# Patient Record
Sex: Female | Born: 1945 | Race: White | Hispanic: Yes | Smoking: Former smoker
Health system: Southern US, Community
[De-identification: ages and names within clinical notes are randomized; demographics above are authoritative.]

## PROBLEM LIST (undated history)

## (undated) ENCOUNTER — Emergency Department (HOSPITAL_COMMUNITY): Admission: EM | Payer: Medicare (Managed Care) | Source: Home / Self Care

## (undated) DIAGNOSIS — Z9889 Other specified postprocedural states: Secondary | ICD-10-CM

## (undated) DIAGNOSIS — I10 Essential (primary) hypertension: Secondary | ICD-10-CM

## (undated) DIAGNOSIS — I5022 Chronic systolic (congestive) heart failure: Secondary | ICD-10-CM

## (undated) DIAGNOSIS — G43909 Migraine, unspecified, not intractable, without status migrainosus: Secondary | ICD-10-CM

## (undated) DIAGNOSIS — IMO0001 Reserved for inherently not codable concepts without codable children: Secondary | ICD-10-CM

## (undated) DIAGNOSIS — G629 Polyneuropathy, unspecified: Secondary | ICD-10-CM

## (undated) DIAGNOSIS — R112 Nausea with vomiting, unspecified: Secondary | ICD-10-CM

## (undated) DIAGNOSIS — Z992 Dependence on renal dialysis: Secondary | ICD-10-CM

## (undated) DIAGNOSIS — E785 Hyperlipidemia, unspecified: Secondary | ICD-10-CM

## (undated) DIAGNOSIS — N186 End stage renal disease: Secondary | ICD-10-CM

## (undated) DIAGNOSIS — I739 Peripheral vascular disease, unspecified: Secondary | ICD-10-CM

## (undated) DIAGNOSIS — F329 Major depressive disorder, single episode, unspecified: Secondary | ICD-10-CM

## (undated) DIAGNOSIS — Z531 Procedure and treatment not carried out because of patient's decision for reasons of belief and group pressure: Secondary | ICD-10-CM

## (undated) DIAGNOSIS — E039 Hypothyroidism, unspecified: Secondary | ICD-10-CM

## (undated) DIAGNOSIS — L940 Localized scleroderma [morphea]: Secondary | ICD-10-CM

## (undated) DIAGNOSIS — I272 Pulmonary hypertension, unspecified: Secondary | ICD-10-CM

## (undated) DIAGNOSIS — N2581 Secondary hyperparathyroidism of renal origin: Secondary | ICD-10-CM

## (undated) DIAGNOSIS — E118 Type 2 diabetes mellitus with unspecified complications: Secondary | ICD-10-CM

## (undated) DIAGNOSIS — M797 Fibromyalgia: Secondary | ICD-10-CM

## (undated) DIAGNOSIS — D649 Anemia, unspecified: Secondary | ICD-10-CM

## (undated) DIAGNOSIS — G5603 Carpal tunnel syndrome, bilateral upper limbs: Secondary | ICD-10-CM

## (undated) DIAGNOSIS — I2510 Atherosclerotic heart disease of native coronary artery without angina pectoris: Secondary | ICD-10-CM

## (undated) DIAGNOSIS — F419 Anxiety disorder, unspecified: Secondary | ICD-10-CM

## (undated) HISTORY — PX: CARDIAC CATHETERIZATION: SHX172

## (undated) HISTORY — PX: PERIPHERAL ARTERIAL STENT GRAFT: SHX2220

## (undated) HISTORY — PX: AV FISTULA PLACEMENT: SHX1204

---

## 1985-05-26 HISTORY — PX: CHOLECYSTECTOMY: SHX55

## 1992-05-26 HISTORY — PX: VAGINAL HYSTERECTOMY: SUR661

## 1998-05-26 HISTORY — PX: CATARACT EXTRACTION, BILATERAL: SHX1313

## 2000-04-14 ENCOUNTER — Emergency Department (HOSPITAL_COMMUNITY): Admission: EM | Admit: 2000-04-14 | Discharge: 2000-04-14 | Payer: Self-pay | Admitting: Emergency Medicine

## 2000-06-02 ENCOUNTER — Other Ambulatory Visit: Admission: RE | Admit: 2000-06-02 | Discharge: 2000-06-02 | Payer: Self-pay | Admitting: Gastroenterology

## 2000-06-02 ENCOUNTER — Encounter (INDEPENDENT_AMBULATORY_CARE_PROVIDER_SITE_OTHER): Payer: Self-pay

## 2000-06-30 ENCOUNTER — Encounter: Admission: RE | Admit: 2000-06-30 | Discharge: 2000-09-28 | Payer: Self-pay | Admitting: Endocrinology

## 2000-07-22 ENCOUNTER — Encounter: Payer: Self-pay | Admitting: Gastroenterology

## 2000-07-22 ENCOUNTER — Ambulatory Visit (HOSPITAL_COMMUNITY): Admission: RE | Admit: 2000-07-22 | Discharge: 2000-07-22 | Payer: Self-pay | Admitting: Gastroenterology

## 2000-07-31 ENCOUNTER — Ambulatory Visit (HOSPITAL_COMMUNITY): Admission: RE | Admit: 2000-07-31 | Discharge: 2000-07-31 | Payer: Self-pay | Admitting: Gastroenterology

## 2000-07-31 ENCOUNTER — Encounter: Payer: Self-pay | Admitting: Gastroenterology

## 2001-02-10 ENCOUNTER — Emergency Department (HOSPITAL_COMMUNITY): Admission: EM | Admit: 2001-02-10 | Discharge: 2001-02-10 | Payer: Self-pay | Admitting: Emergency Medicine

## 2001-02-12 ENCOUNTER — Emergency Department (HOSPITAL_COMMUNITY): Admission: EM | Admit: 2001-02-12 | Discharge: 2001-02-12 | Payer: Self-pay | Admitting: *Deleted

## 2001-02-15 ENCOUNTER — Other Ambulatory Visit: Admission: RE | Admit: 2001-02-15 | Discharge: 2001-02-15 | Payer: Self-pay | Admitting: Obstetrics and Gynecology

## 2001-02-19 ENCOUNTER — Encounter: Payer: Self-pay | Admitting: Obstetrics and Gynecology

## 2001-02-19 ENCOUNTER — Encounter: Admission: RE | Admit: 2001-02-19 | Discharge: 2001-02-19 | Payer: Self-pay | Admitting: Obstetrics and Gynecology

## 2004-05-26 HISTORY — PX: BYPASS GRAFT: SHX909

## 2004-10-11 ENCOUNTER — Inpatient Hospital Stay (HOSPITAL_COMMUNITY): Admission: EM | Admit: 2004-10-11 | Discharge: 2004-10-17 | Payer: Self-pay | Admitting: Emergency Medicine

## 2004-10-14 ENCOUNTER — Encounter (INDEPENDENT_AMBULATORY_CARE_PROVIDER_SITE_OTHER): Payer: Self-pay | Admitting: Cardiology

## 2004-10-16 ENCOUNTER — Encounter: Payer: Self-pay | Admitting: Cardiology

## 2004-11-03 ENCOUNTER — Encounter: Admission: RE | Admit: 2004-11-03 | Discharge: 2004-11-03 | Payer: Self-pay | Admitting: Internal Medicine

## 2004-12-20 ENCOUNTER — Encounter: Admission: RE | Admit: 2004-12-20 | Discharge: 2004-12-20 | Payer: Self-pay | Admitting: Internal Medicine

## 2005-01-16 ENCOUNTER — Encounter: Admission: RE | Admit: 2005-01-16 | Discharge: 2005-01-16 | Payer: Self-pay | Admitting: Orthopedic Surgery

## 2005-01-29 ENCOUNTER — Encounter: Admission: RE | Admit: 2005-01-29 | Discharge: 2005-01-29 | Payer: Self-pay | Admitting: Orthopedic Surgery

## 2005-04-22 ENCOUNTER — Inpatient Hospital Stay (HOSPITAL_COMMUNITY): Admission: AD | Admit: 2005-04-22 | Discharge: 2005-05-01 | Payer: Self-pay | Admitting: Internal Medicine

## 2005-04-23 ENCOUNTER — Encounter (INDEPENDENT_AMBULATORY_CARE_PROVIDER_SITE_OTHER): Payer: Self-pay | Admitting: Cardiology

## 2005-05-26 HISTORY — PX: JOINT REPLACEMENT: SHX530

## 2005-06-05 ENCOUNTER — Ambulatory Visit (HOSPITAL_COMMUNITY): Admission: RE | Admit: 2005-06-05 | Discharge: 2005-06-06 | Payer: Self-pay | Admitting: Cardiology

## 2005-08-07 ENCOUNTER — Inpatient Hospital Stay (HOSPITAL_COMMUNITY): Admission: EM | Admit: 2005-08-07 | Discharge: 2005-08-09 | Payer: Self-pay | Admitting: Emergency Medicine

## 2005-08-27 ENCOUNTER — Encounter: Admission: RE | Admit: 2005-08-27 | Discharge: 2005-08-27 | Payer: Self-pay | Admitting: Internal Medicine

## 2005-12-28 IMAGING — CT CT L SPINE W/ CM
3 of 8 series · 15 of 33 positions shown, 18 images · IV contrast (omnipaque)
Comparison: none

CLINICAL DATA: The patient has low back pain and is referred for a lumbar myelogram. 
 LUMBAR MYELOGRAM:
 Lumbar puncture is performed at the L-4 level.  CSF is clear.  15 cc Omnipaque 180 contrast injected.  The needle was withdrawn and multiple images were obtained. 
 Patient with five non-rib bearing lumbar vertebrae.  Alignment anatomic.  Disc height loss at L5-S1.  On the AP and oblique projections, the nerve roots fill normally.  Shallow ventral defect is noted at L5-S1.

[Series 4: recon 3: l-spine helical · axial · 0.27mm/px · z∈[-15,+129]mm · 7 of 155 slices shown, 9 images]
[im 20/155  soft-tissue]
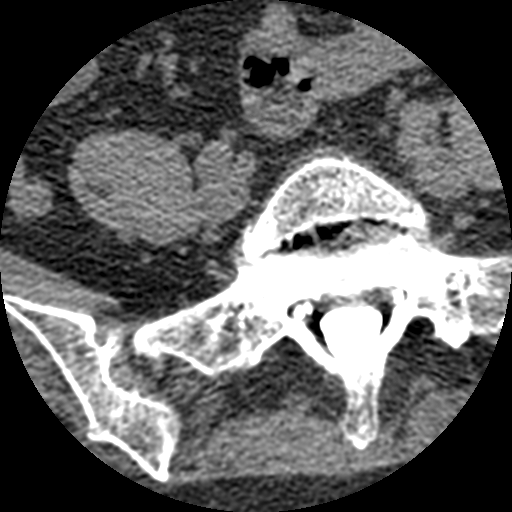
[im 20/155  bone]
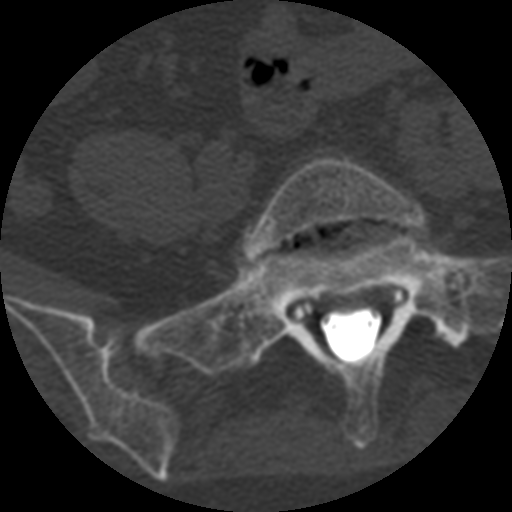
[im 39/155  bone]
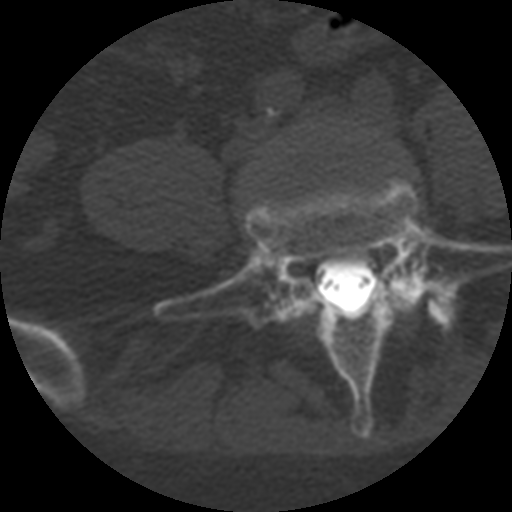
[im 58/155  bone]
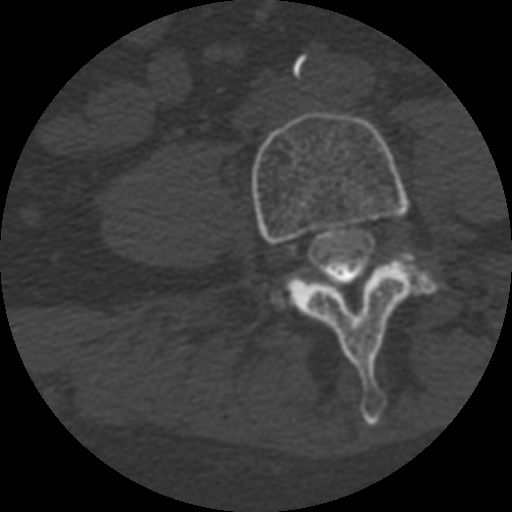
[im 78/155  bone]
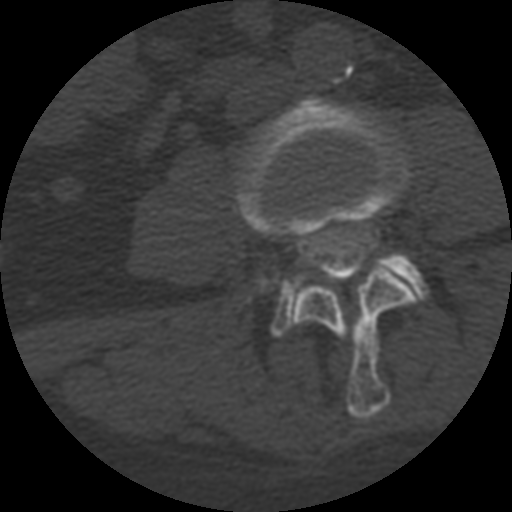
[im 97/155  soft-tissue]
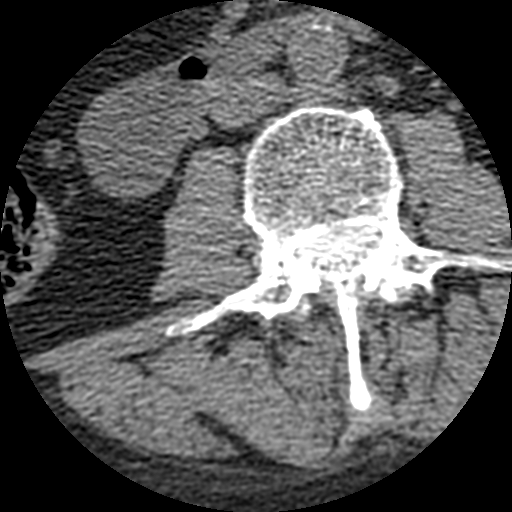
[im 97/155  bone]
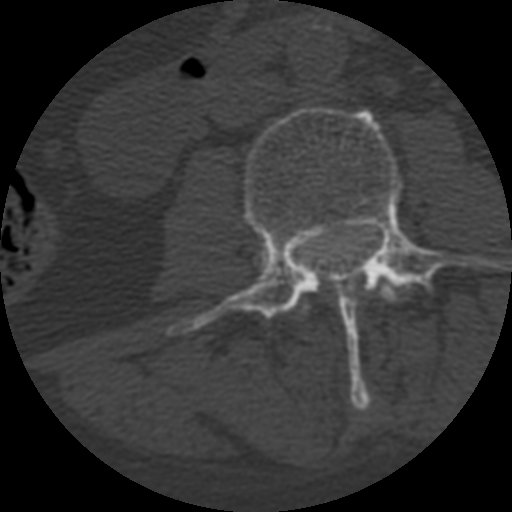
[im 116/155  bone]
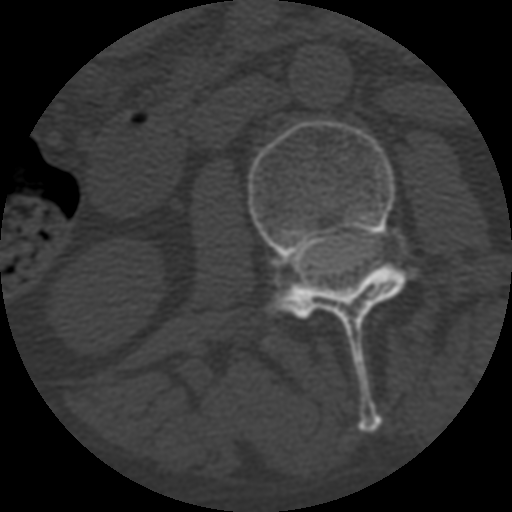
[im 135/155  bone]
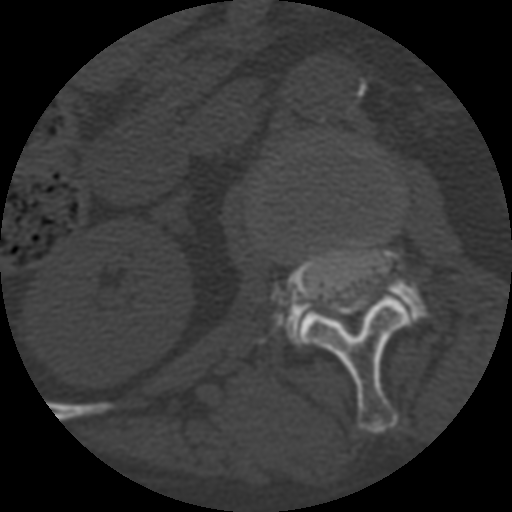

[Series 400: reformatted · sagittal · 0.37mm/px · 5 of 34 slices shown, 6 images (1 of 2)]
[im 12/34  bone]
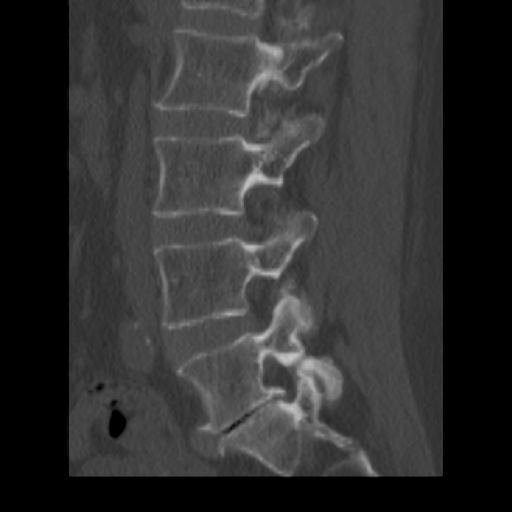
[im 14/34  bone]
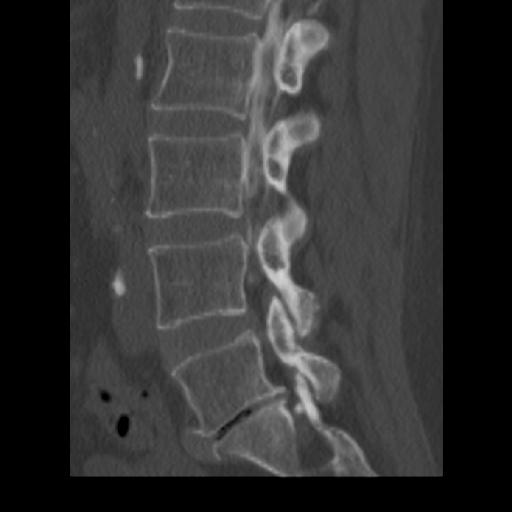
[im 17/34  soft-tissue]
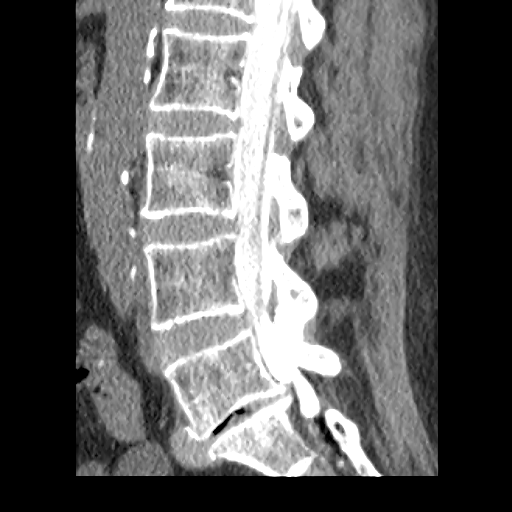
[im 17/34  bone]
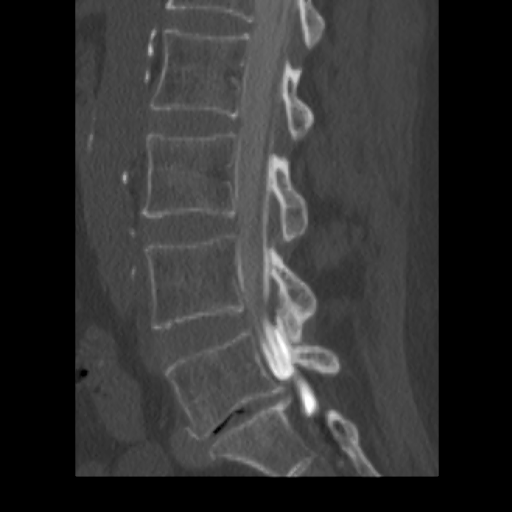
[im 20/34  bone]
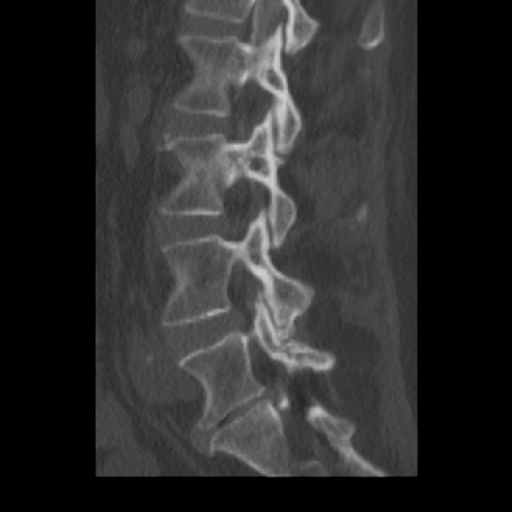
[im 23/34  bone]
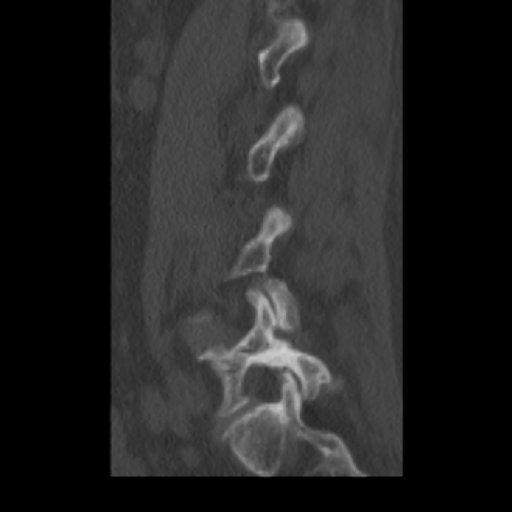

[Series 401: reformatted · coronal · 0.37mm/px · 3 of 40 slices shown (2 of 2)]
[im 8/40  bone]
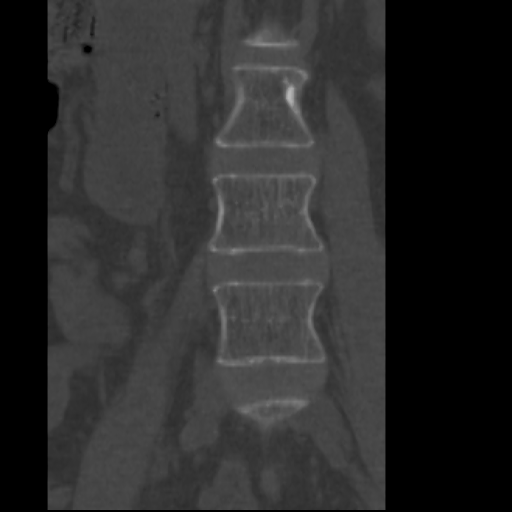
[im 16/40  bone]
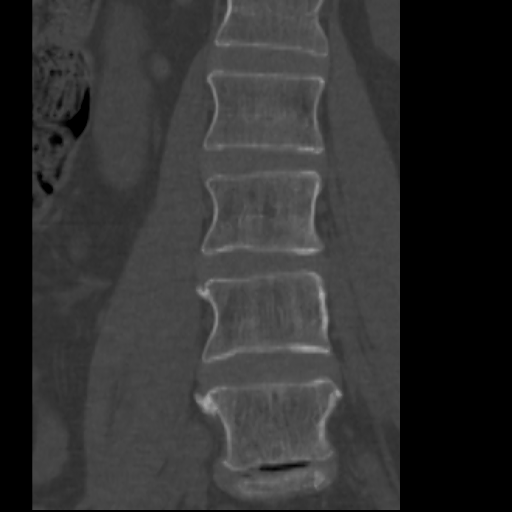
[im 24/40  bone]
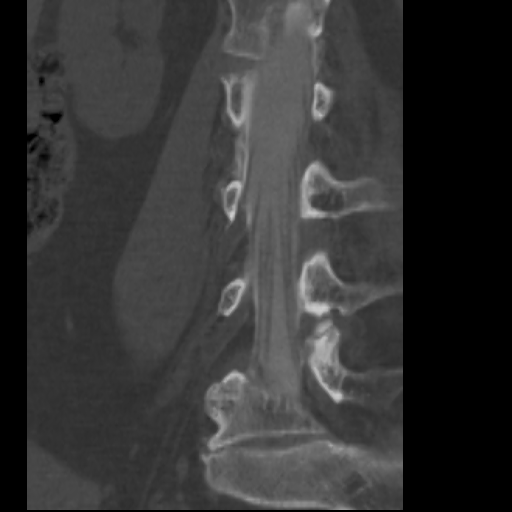

[15 of 33 positions shown; findings below may reference images not displayed]

IMPRESSION: Degenerative disc disease L5-S1.  No extradural defects appreciated.  CT to follow.
 CT LUMBAR SPINE POST-INTRATHECAL CONTRAST:
 Helical imaging supplemented with coronal and sagittal reformatted images. 
 L1-2:  Mild facet degenerative change.  Negative for central nor foraminal stenosis. 
 L2-3:  Mild facet degenerative change.  Negative for disc herniation, central nor foraminal stenosis. 
 L3-4:  Mild diffuse disc bulge without focal protrusion.  Mild facet degenerative changes.  No appreciable central nor foraminal stenosis.
 L4-5:  Disc bulge with mild facet osteoarthritis and ligamentum flavum hypertrophy.  This results in mild multifactorial central stenosis.  There is no appreciable subarticular lateral recess stenosis nor encroachment on the L-4 nerve roots.
 L5-S1:  Disc height loss with posterior osseous ridging eccentric right.   No encroachment upon the exiting L-5 roots.  Diffuse disc bulge eccentric right.  There is no appreciable lateral recess nor central stenosis.  Facet degenerative changes bilaterally.
IMPRESSION: 1.  Degenerative disc disease at the L5-S1 level without appreciable foraminal encroachment.  Disc bulge extends posteriorly without mass effect upon the S-1 roots or central stenosis.  Associated facet degenerative change. 
 2.  L4-5:  Mild multifactorial central stenosis.

## 2007-06-11 ENCOUNTER — Encounter (HOSPITAL_COMMUNITY): Admission: RE | Admit: 2007-06-11 | Discharge: 2007-09-09 | Payer: Self-pay | Admitting: Nephrology

## 2007-10-06 ENCOUNTER — Encounter (HOSPITAL_COMMUNITY): Admission: RE | Admit: 2007-10-06 | Discharge: 2008-01-04 | Payer: Self-pay | Admitting: Nephrology

## 2008-01-20 ENCOUNTER — Encounter (HOSPITAL_COMMUNITY): Admission: RE | Admit: 2008-01-20 | Discharge: 2008-04-19 | Payer: Self-pay | Admitting: Nephrology

## 2008-02-03 ENCOUNTER — Ambulatory Visit: Payer: Self-pay | Admitting: Surgery

## 2008-02-09 ENCOUNTER — Ambulatory Visit: Payer: Self-pay | Admitting: Vascular Surgery

## 2008-02-17 ENCOUNTER — Ambulatory Visit: Payer: Self-pay | Admitting: Vascular Surgery

## 2008-02-17 ENCOUNTER — Ambulatory Visit (HOSPITAL_COMMUNITY): Admission: RE | Admit: 2008-02-17 | Discharge: 2008-02-17 | Payer: Self-pay | Admitting: Vascular Surgery

## 2008-02-23 ENCOUNTER — Ambulatory Visit: Payer: Self-pay | Admitting: Vascular Surgery

## 2008-03-03 ENCOUNTER — Ambulatory Visit (HOSPITAL_COMMUNITY): Admission: RE | Admit: 2008-03-03 | Discharge: 2008-03-03 | Payer: Self-pay | Admitting: Vascular Surgery

## 2008-03-08 ENCOUNTER — Ambulatory Visit: Payer: Self-pay | Admitting: Vascular Surgery

## 2008-03-15 ENCOUNTER — Ambulatory Visit: Payer: Self-pay | Admitting: Vascular Surgery

## 2008-04-28 ENCOUNTER — Encounter (HOSPITAL_COMMUNITY): Admission: RE | Admit: 2008-04-28 | Discharge: 2008-05-25 | Payer: Self-pay | Admitting: Nephrology

## 2008-05-31 ENCOUNTER — Ambulatory Visit: Payer: Self-pay | Admitting: Vascular Surgery

## 2008-06-13 ENCOUNTER — Encounter (HOSPITAL_COMMUNITY): Admission: RE | Admit: 2008-06-13 | Discharge: 2008-09-11 | Payer: Self-pay | Admitting: Nephrology

## 2008-07-25 ENCOUNTER — Ambulatory Visit (HOSPITAL_COMMUNITY): Admission: RE | Admit: 2008-07-25 | Discharge: 2008-07-25 | Payer: Self-pay | Admitting: Vascular Surgery

## 2008-07-25 ENCOUNTER — Ambulatory Visit: Payer: Self-pay | Admitting: *Deleted

## 2008-08-25 ENCOUNTER — Emergency Department (HOSPITAL_COMMUNITY): Admission: EM | Admit: 2008-08-25 | Discharge: 2008-08-25 | Payer: Self-pay | Admitting: Emergency Medicine

## 2008-10-20 ENCOUNTER — Ambulatory Visit: Payer: Self-pay | Admitting: Vascular Surgery

## 2008-10-21 ENCOUNTER — Ambulatory Visit: Payer: Self-pay | Admitting: Cardiology

## 2008-10-21 ENCOUNTER — Ambulatory Visit: Payer: Self-pay | Admitting: *Deleted

## 2008-10-21 ENCOUNTER — Inpatient Hospital Stay (HOSPITAL_COMMUNITY): Admission: EM | Admit: 2008-10-21 | Discharge: 2008-11-16 | Payer: Self-pay | Admitting: Emergency Medicine

## 2008-10-24 ENCOUNTER — Encounter: Payer: Self-pay | Admitting: Vascular Surgery

## 2008-10-24 HISTORY — PX: LEG AMPUTATION BELOW KNEE: SHX694

## 2008-10-25 ENCOUNTER — Encounter: Payer: Self-pay | Admitting: Family Medicine

## 2008-10-25 ENCOUNTER — Encounter: Payer: Self-pay | Admitting: Vascular Surgery

## 2008-10-26 ENCOUNTER — Encounter: Payer: Self-pay | Admitting: Vascular Surgery

## 2008-10-27 ENCOUNTER — Encounter: Payer: Self-pay | Admitting: Vascular Surgery

## 2008-11-06 ENCOUNTER — Encounter: Payer: Self-pay | Admitting: Vascular Surgery

## 2008-11-07 ENCOUNTER — Ambulatory Visit: Payer: Self-pay | Admitting: Physical Medicine & Rehabilitation

## 2008-11-16 ENCOUNTER — Ambulatory Visit: Payer: Self-pay | Admitting: Physical Medicine & Rehabilitation

## 2008-11-16 ENCOUNTER — Inpatient Hospital Stay (HOSPITAL_COMMUNITY)
Admission: RE | Admit: 2008-11-16 | Discharge: 2008-11-24 | Payer: Self-pay | Admitting: Physical Medicine & Rehabilitation

## 2008-12-13 ENCOUNTER — Ambulatory Visit: Payer: Self-pay | Admitting: Vascular Surgery

## 2008-12-22 ENCOUNTER — Ambulatory Visit (HOSPITAL_COMMUNITY): Admission: RE | Admit: 2008-12-22 | Discharge: 2008-12-22 | Payer: Self-pay | Admitting: Vascular Surgery

## 2008-12-22 ENCOUNTER — Ambulatory Visit: Payer: Self-pay | Admitting: Vascular Surgery

## 2008-12-29 ENCOUNTER — Ambulatory Visit (HOSPITAL_COMMUNITY): Admission: RE | Admit: 2008-12-29 | Discharge: 2008-12-29 | Payer: Self-pay | Admitting: Vascular Surgery

## 2009-01-17 ENCOUNTER — Ambulatory Visit: Payer: Self-pay | Admitting: Vascular Surgery

## 2009-01-31 ENCOUNTER — Ambulatory Visit: Payer: Self-pay | Admitting: Vascular Surgery

## 2009-03-07 ENCOUNTER — Ambulatory Visit: Payer: Self-pay | Admitting: Vascular Surgery

## 2009-04-11 ENCOUNTER — Ambulatory Visit: Payer: Self-pay | Admitting: Vascular Surgery

## 2009-06-20 ENCOUNTER — Ambulatory Visit: Payer: Self-pay | Admitting: Vascular Surgery

## 2009-07-31 ENCOUNTER — Encounter: Admission: RE | Admit: 2009-07-31 | Discharge: 2009-09-03 | Payer: Self-pay | Admitting: Vascular Surgery

## 2009-08-22 ENCOUNTER — Ambulatory Visit: Payer: Self-pay | Admitting: Vascular Surgery

## 2009-08-31 ENCOUNTER — Ambulatory Visit (HOSPITAL_COMMUNITY): Admission: RE | Admit: 2009-08-31 | Discharge: 2009-08-31 | Payer: Self-pay | Admitting: Vascular Surgery

## 2009-08-31 ENCOUNTER — Ambulatory Visit: Payer: Self-pay | Admitting: Vascular Surgery

## 2009-09-04 ENCOUNTER — Inpatient Hospital Stay (HOSPITAL_COMMUNITY): Admission: RE | Admit: 2009-09-04 | Discharge: 2009-09-12 | Payer: Self-pay | Admitting: Vascular Surgery

## 2009-09-04 ENCOUNTER — Encounter: Payer: Self-pay | Admitting: Vascular Surgery

## 2009-09-05 ENCOUNTER — Encounter: Payer: Self-pay | Admitting: Vascular Surgery

## 2009-09-26 ENCOUNTER — Ambulatory Visit: Payer: Self-pay | Admitting: Vascular Surgery

## 2009-10-03 ENCOUNTER — Ambulatory Visit: Payer: Self-pay | Admitting: Vascular Surgery

## 2009-10-19 ENCOUNTER — Emergency Department (HOSPITAL_COMMUNITY): Admission: EM | Admit: 2009-10-19 | Discharge: 2009-10-19 | Payer: Self-pay | Admitting: Emergency Medicine

## 2009-10-24 HISTORY — PX: HEMIARTHROPLASTY HIP: SUR652

## 2009-10-25 ENCOUNTER — Ambulatory Visit: Payer: Self-pay | Admitting: Vascular Surgery

## 2009-10-31 ENCOUNTER — Ambulatory Visit: Payer: Self-pay | Admitting: Vascular Surgery

## 2009-11-01 ENCOUNTER — Ambulatory Visit: Payer: Self-pay | Admitting: Vascular Surgery

## 2009-11-01 ENCOUNTER — Ambulatory Visit (HOSPITAL_COMMUNITY): Admission: RE | Admit: 2009-11-01 | Discharge: 2009-11-01 | Payer: Self-pay | Admitting: Vascular Surgery

## 2009-11-07 ENCOUNTER — Ambulatory Visit: Payer: Self-pay | Admitting: Vascular Surgery

## 2009-11-12 ENCOUNTER — Inpatient Hospital Stay (HOSPITAL_COMMUNITY)
Admission: EM | Admit: 2009-11-12 | Discharge: 2009-11-16 | Disposition: A | Discharge status: Rehabilitation Facility | Payer: Self-pay | Source: Home / Self Care | Admitting: *Deleted

## 2009-11-15 ENCOUNTER — Ambulatory Visit: Payer: Self-pay | Admitting: Physical Medicine & Rehabilitation

## 2009-11-16 ENCOUNTER — Ambulatory Visit: Payer: Self-pay | Admitting: Physical Medicine & Rehabilitation

## 2009-11-16 ENCOUNTER — Inpatient Hospital Stay (HOSPITAL_COMMUNITY)
Admission: RE | Admit: 2009-11-16 | Discharge: 2009-11-24 | Payer: Self-pay | Admitting: Physical Medicine & Rehabilitation

## 2009-12-05 ENCOUNTER — Ambulatory Visit (HOSPITAL_COMMUNITY): Admission: RE | Admit: 2009-12-05 | Discharge: 2009-12-05 | Payer: Self-pay | Admitting: Nephrology

## 2009-12-05 ENCOUNTER — Ambulatory Visit (HOSPITAL_COMMUNITY): Admission: AD | Admit: 2009-12-05 | Discharge: 2009-12-05 | Payer: Self-pay | Admitting: Vascular Surgery

## 2009-12-05 ENCOUNTER — Ambulatory Visit: Payer: Self-pay | Admitting: Vascular Surgery

## 2009-12-06 ENCOUNTER — Ambulatory Visit (HOSPITAL_COMMUNITY): Admission: AD | Admit: 2009-12-06 | Discharge: 2009-12-06 | Payer: Self-pay | Admitting: Surgery

## 2010-01-17 ENCOUNTER — Encounter: Admission: RE | Admit: 2010-01-17 | Discharge: 2010-02-12 | Payer: Self-pay | Admitting: Specialist

## 2010-02-12 ENCOUNTER — Observation Stay (HOSPITAL_COMMUNITY): Admission: EM | Admit: 2010-02-12 | Discharge: 2010-02-14 | Payer: Self-pay | Admitting: Emergency Medicine

## 2010-03-15 ENCOUNTER — Ambulatory Visit (HOSPITAL_COMMUNITY): Admission: RE | Admit: 2010-03-15 | Discharge: 2010-03-15 | Payer: Self-pay | Admitting: Nephrology

## 2010-04-04 ENCOUNTER — Ambulatory Visit: Payer: Self-pay | Admitting: Vascular Surgery

## 2010-04-09 ENCOUNTER — Ambulatory Visit: Payer: Self-pay | Admitting: Vascular Surgery

## 2010-04-09 ENCOUNTER — Ambulatory Visit (HOSPITAL_COMMUNITY): Admission: RE | Admit: 2010-04-09 | Discharge: 2010-04-09 | Payer: Self-pay | Admitting: Vascular Surgery

## 2010-04-25 ENCOUNTER — Ambulatory Visit: Payer: Self-pay | Admitting: Vascular Surgery

## 2010-05-07 ENCOUNTER — Ambulatory Visit (HOSPITAL_COMMUNITY)
Admission: RE | Admit: 2010-05-07 | Discharge: 2010-05-07 | Payer: Self-pay | Source: Home / Self Care | Attending: Vascular Surgery | Admitting: Vascular Surgery

## 2010-05-23 ENCOUNTER — Ambulatory Visit: Payer: Self-pay | Admitting: Vascular Surgery

## 2010-06-16 ENCOUNTER — Encounter: Payer: Self-pay | Admitting: Internal Medicine

## 2010-06-20 ENCOUNTER — Ambulatory Visit
Admission: RE | Admit: 2010-06-20 | Discharge: 2010-06-20 | Payer: Self-pay | Source: Home / Self Care | Attending: Vascular Surgery | Admitting: Vascular Surgery

## 2010-06-20 ENCOUNTER — Encounter
Admission: RE | Admit: 2010-06-20 | Discharge: 2010-06-25 | Payer: Self-pay | Source: Home / Self Care | Attending: Specialist | Admitting: Specialist

## 2010-06-21 NOTE — Assessment & Plan Note (Signed)
OFFICE VISIT  [name], [name] DOB:  Mar 17, 1946                                       06/20/2010 [id number]#:[id number]  The patient returns for followup today.  She previously underwent a right basilic vein transposition approximately 1 month ago.  PHYSICAL EXAM:  Today blood pressure is 108/73 in the left arm, heart rate 75 and regular.  She has an easily palpable thrill and the fistula is palpable throughout the course of the right upper arm.  She occasionally has some numbness in her right middle finger but states this is usually transient and occurs early in the morning.  Otherwise she has no signs or symptoms suggestive of steal.  She apparently is also being evaluated by Dr. Otelia Sergeant for possible bilateral carpal tunnel syndrome which may explain the numbness in her middle finger.  In summary, I believe the fistula is maturing well.  It should be ready for cannulation in about 2 more months.  She will continue to use her catheter in the meantime.  She will be scheduled in the near future for reevaluation of her bypass graft as she is due for protocol scan.    Janetta Hora. Fields, MD Electronically Signed  CEF/MEDQ  D:  06/21/2010  T:  06/21/2010  Job:  [id number]  cc:   [facility]

## 2010-06-27 ENCOUNTER — Ambulatory Visit: Payer: Self-pay | Attending: Specialist | Admitting: Physical Therapy

## 2010-06-27 DIAGNOSIS — R5381 Other malaise: Secondary | ICD-10-CM | POA: Insufficient documentation

## 2010-06-27 DIAGNOSIS — M6281 Muscle weakness (generalized): Secondary | ICD-10-CM | POA: Insufficient documentation

## 2010-06-27 DIAGNOSIS — R269 Unspecified abnormalities of gait and mobility: Secondary | ICD-10-CM | POA: Insufficient documentation

## 2010-06-27 DIAGNOSIS — S88119A Complete traumatic amputation at level between knee and ankle, unspecified lower leg, initial encounter: Secondary | ICD-10-CM | POA: Insufficient documentation

## 2010-06-27 DIAGNOSIS — IMO0001 Reserved for inherently not codable concepts without codable children: Secondary | ICD-10-CM | POA: Insufficient documentation

## 2010-07-02 ENCOUNTER — Encounter: Payer: Self-pay | Admitting: Physical Therapy

## 2010-07-04 ENCOUNTER — Ambulatory Visit: Payer: Self-pay | Admitting: Physical Therapy

## 2010-07-04 ENCOUNTER — Encounter: Payer: Self-pay | Admitting: Physical Therapy

## 2010-07-09 ENCOUNTER — Ambulatory Visit: Payer: Self-pay | Admitting: Physical Therapy

## 2010-07-11 ENCOUNTER — Encounter: Payer: Self-pay | Admitting: Physical Therapy

## 2010-07-16 ENCOUNTER — Ambulatory Visit: Payer: Self-pay | Admitting: Physical Therapy

## 2010-07-18 ENCOUNTER — Encounter: Payer: Self-pay | Admitting: Physical Therapy

## 2010-07-23 ENCOUNTER — Encounter: Payer: Self-pay | Admitting: Physical Therapy

## 2010-07-25 ENCOUNTER — Encounter: Payer: Self-pay | Admitting: Physical Therapy

## 2010-07-30 ENCOUNTER — Encounter: Payer: Medicare Other | Admitting: Physical Therapy

## 2010-08-01 ENCOUNTER — Encounter: Payer: Medicare Other | Admitting: Physical Therapy

## 2010-08-05 LAB — POCT I-STAT 4, (NA,K, GLUC, HGB,HCT)
Glucose, Bld: 99 mg/dL (ref 70–99)
HCT: 37 % (ref 36.0–46.0)
Sodium: 138 mEq/L (ref 135–145)

## 2010-08-05 LAB — SURGICAL PCR SCREEN
MRSA, PCR: NEGATIVE
Staphylococcus aureus: NEGATIVE

## 2010-08-06 ENCOUNTER — Encounter: Payer: Medicare Other | Admitting: Physical Therapy

## 2010-08-06 LAB — POCT I-STAT 4, (NA,K, GLUC, HGB,HCT): HCT: 41 % (ref 36.0–46.0)

## 2010-08-06 LAB — GLUCOSE, CAPILLARY
Glucose-Capillary: 116 mg/dL — ABNORMAL HIGH (ref 70–99)
Glucose-Capillary: 84 mg/dL (ref 70–99)

## 2010-08-06 LAB — SURGICAL PCR SCREEN: MRSA, PCR: NEGATIVE

## 2010-08-08 ENCOUNTER — Encounter: Payer: Medicare Other | Admitting: Physical Therapy

## 2010-08-08 LAB — DIFFERENTIAL
Basophils Absolute: 0 10*3/uL (ref 0.0–0.1)
Basophils Relative: 0 % (ref 0–1)
Basophils Relative: 0 % (ref 0–1)
Eosinophils Absolute: 0.2 10*3/uL (ref 0.0–0.7)
Eosinophils Absolute: 0.3 10*3/uL (ref 0.0–0.7)
Lymphs Abs: 1 10*3/uL (ref 0.7–4.0)
Monocytes Absolute: 0.6 10*3/uL (ref 0.1–1.0)
Monocytes Relative: 8 % (ref 3–12)
Neutro Abs: 4.5 10*3/uL (ref 1.7–7.7)
Neutro Abs: 5.5 10*3/uL (ref 1.7–7.7)
Neutrophils Relative %: 71 % (ref 43–77)

## 2010-08-08 LAB — BASIC METABOLIC PANEL
BUN: 22 mg/dL (ref 6–23)
CO2: 28 mEq/L (ref 19–32)
Calcium: 9.1 mg/dL (ref 8.4–10.5)
GFR calc non Af Amer: 11 mL/min — ABNORMAL LOW (ref >60)
Glucose, Bld: 123 mg/dL — ABNORMAL HIGH (ref 70–99)
Potassium: 4.5 mEq/L (ref 3.5–5.1)
Sodium: 138 mEq/L (ref 135–145)

## 2010-08-08 LAB — HEMOGLOBIN A1C
Hgb A1c MFr Bld: 6.3 % — ABNORMAL HIGH (ref <5.7)
Mean Plasma Glucose: 134 mg/dL — ABNORMAL HIGH (ref <117)

## 2010-08-08 LAB — CBC
HCT: 36.6 % (ref 36.0–46.0)
HCT: 37 % (ref 36.0–46.0)
Hemoglobin: 11.6 g/dL — ABNORMAL LOW (ref 12.0–15.0)
Hemoglobin: 11.7 g/dL — ABNORMAL LOW (ref 12.0–15.0)
MCH: 30.1 pg (ref 26.0–34.0)
MCH: 30.2 pg (ref 26.0–34.0)
MCH: 30.4 pg (ref 26.0–34.0)
MCHC: 31.4 g/dL (ref 30.0–36.0)
MCHC: 31.8 g/dL (ref 30.0–36.0)
MCHC: 32 g/dL (ref 30.0–36.0)
MCV: 96.1 fL (ref 78.0–100.0)
Platelets: 161 10*3/uL (ref 150–400)
RDW: 14.8 % (ref 11.5–15.5)
RDW: 15.1 % (ref 11.5–15.5)

## 2010-08-08 LAB — GLUCOSE, CAPILLARY
Glucose-Capillary: 102 mg/dL — ABNORMAL HIGH (ref 70–99)
Glucose-Capillary: 141 mg/dL — ABNORMAL HIGH (ref 70–99)
Glucose-Capillary: 144 mg/dL — ABNORMAL HIGH (ref 70–99)

## 2010-08-08 LAB — COMPREHENSIVE METABOLIC PANEL
ALT: 12 U/L (ref 0–35)
BUN: 28 mg/dL — ABNORMAL HIGH (ref 6–23)
CO2: 31 mEq/L (ref 19–32)
Calcium: 9 mg/dL (ref 8.4–10.5)
GFR calc non Af Amer: 9 mL/min — ABNORMAL LOW (ref >60)
Glucose, Bld: 102 mg/dL — ABNORMAL HIGH (ref 70–99)
Sodium: 137 mEq/L (ref 135–145)

## 2010-08-08 LAB — RENAL FUNCTION PANEL
Albumin: 2.9 g/dL — ABNORMAL LOW (ref 3.5–5.2)
Calcium: 8.5 mg/dL (ref 8.4–10.5)
Creatinine, Ser: 6.44 mg/dL — ABNORMAL HIGH (ref 0.4–1.2)
GFR calc Af Amer: 8 mL/min — ABNORMAL LOW (ref >60)
GFR calc non Af Amer: 7 mL/min — ABNORMAL LOW (ref >60)
Phosphorus: 4.8 mg/dL — ABNORMAL HIGH (ref 2.3–4.6)

## 2010-08-08 LAB — POCT I-STAT, CHEM 8
BUN: 23 mg/dL (ref 6–23)
Calcium, Ion: 0.9 mmol/L — ABNORMAL LOW (ref 1.12–1.32)
Chloride: 104 mEq/L (ref 96–112)
Creatinine, Ser: 4.5 mg/dL — ABNORMAL HIGH (ref 0.4–1.2)
Glucose, Bld: 125 mg/dL — ABNORMAL HIGH (ref 70–99)
TCO2: 29 mmol/L (ref 0–100)

## 2010-08-08 LAB — LIPID PANEL
Cholesterol: 144 mg/dL (ref 0–200)
Triglycerides: 128 mg/dL (ref <150)

## 2010-08-11 LAB — GLUCOSE, CAPILLARY
Glucose-Capillary: 102 mg/dL — ABNORMAL HIGH (ref 70–99)
Glucose-Capillary: 102 mg/dL — ABNORMAL HIGH (ref 70–99)
Glucose-Capillary: 110 mg/dL — ABNORMAL HIGH (ref 70–99)
Glucose-Capillary: 178 mg/dL — ABNORMAL HIGH (ref 70–99)
Glucose-Capillary: 80 mg/dL (ref 70–99)
Glucose-Capillary: 102 mg/dL — ABNORMAL HIGH (ref 70–99)
Glucose-Capillary: 103 mg/dL — ABNORMAL HIGH (ref 70–99)
Glucose-Capillary: 103 mg/dL — ABNORMAL HIGH (ref 70–99)
Glucose-Capillary: 104 mg/dL — ABNORMAL HIGH (ref 70–99)
Glucose-Capillary: 107 mg/dL — ABNORMAL HIGH (ref 70–99)
Glucose-Capillary: 107 mg/dL — ABNORMAL HIGH (ref 70–99)
Glucose-Capillary: 110 mg/dL — ABNORMAL HIGH (ref 70–99)
Glucose-Capillary: 112 mg/dL — ABNORMAL HIGH (ref 70–99)
Glucose-Capillary: 117 mg/dL — ABNORMAL HIGH (ref 70–99)
Glucose-Capillary: 121 mg/dL — ABNORMAL HIGH (ref 70–99)
Glucose-Capillary: 122 mg/dL — ABNORMAL HIGH (ref 70–99)
Glucose-Capillary: 122 mg/dL — ABNORMAL HIGH (ref 70–99)
Glucose-Capillary: 128 mg/dL — ABNORMAL HIGH (ref 70–99)
Glucose-Capillary: 128 mg/dL — ABNORMAL HIGH (ref 70–99)
Glucose-Capillary: 131 mg/dL — ABNORMAL HIGH (ref 70–99)
Glucose-Capillary: 133 mg/dL — ABNORMAL HIGH (ref 70–99)
Glucose-Capillary: 138 mg/dL — ABNORMAL HIGH (ref 70–99)
Glucose-Capillary: 139 mg/dL — ABNORMAL HIGH (ref 70–99)
Glucose-Capillary: 173 mg/dL — ABNORMAL HIGH (ref 70–99)
Glucose-Capillary: 79 mg/dL (ref 70–99)
Glucose-Capillary: 85 mg/dL (ref 70–99)
Glucose-Capillary: 94 mg/dL (ref 70–99)
Glucose-Capillary: 95 mg/dL (ref 70–99)
Glucose-Capillary: 96 mg/dL (ref 70–99)

## 2010-08-11 LAB — CBC
HCT: 28.7 % — ABNORMAL LOW (ref 36.0–46.0)
HCT: 29.3 % — ABNORMAL LOW (ref 36.0–46.0)
HCT: 31.3 % — ABNORMAL LOW (ref 36.0–46.0)
HCT: 33.8 % — ABNORMAL LOW (ref 36.0–46.0)
HCT: 35.9 % — ABNORMAL LOW (ref 36.0–46.0)
HCT: 36.8 % (ref 36.0–46.0)
Hemoglobin: 9.8 g/dL — ABNORMAL LOW (ref 12.0–15.0)
Hemoglobin: 10 g/dL — ABNORMAL LOW (ref 12.0–15.0)
Hemoglobin: 10.5 g/dL — ABNORMAL LOW (ref 12.0–15.0)
Hemoglobin: 11.4 g/dL — ABNORMAL LOW (ref 12.0–15.0)
Hemoglobin: 11.9 g/dL — ABNORMAL LOW (ref 12.0–15.0)
Hemoglobin: 9.4 g/dL — ABNORMAL LOW (ref 12.0–15.0)
Hemoglobin: 9.6 g/dL — ABNORMAL LOW (ref 12.0–15.0)
MCH: 31.5 pg (ref 26.0–34.0)
MCH: 31.8 pg (ref 26.0–34.0)
MCH: 32.2 pg (ref 26.0–34.0)
MCHC: 32.7 g/dL (ref 30.0–36.0)
MCHC: 33.5 g/dL (ref 30.0–36.0)
MCHC: 33.7 g/dL (ref 30.0–36.0)
MCHC: 33.7 g/dL (ref 30.0–36.0)
MCHC: 33.8 g/dL (ref 30.0–36.0)
MCHC: 34 g/dL (ref 30.0–36.0)
MCV: 94.7 fL (ref 78.0–100.0)
MCV: 94.5 fL (ref 78.0–100.0)
MCV: 95.8 fL (ref 78.0–100.0)
Platelets: 111 10*3/uL — ABNORMAL LOW (ref 150–400)
Platelets: 171 10*3/uL (ref 150–400)
Platelets: 186 10*3/uL (ref 150–400)
Platelets: 191 10*3/uL (ref 150–400)
RBC: 3.1 MIL/uL — ABNORMAL LOW (ref 3.87–5.11)
RBC: 2.95 MIL/uL — ABNORMAL LOW (ref 3.87–5.11)
RBC: 3.53 MIL/uL — ABNORMAL LOW (ref 3.87–5.11)
RDW: 15.6 % — ABNORMAL HIGH (ref 11.5–15.5)
RDW: 16 % — ABNORMAL HIGH (ref 11.5–15.5)
RDW: 16.4 % — ABNORMAL HIGH (ref 11.5–15.5)
RDW: 16.5 % — ABNORMAL HIGH (ref 11.5–15.5)
RDW: 16.6 % — ABNORMAL HIGH (ref 11.5–15.5)
RDW: 16.9 % — ABNORMAL HIGH (ref 11.5–15.5)
WBC: 8 10*3/uL (ref 4.0–10.5)
WBC: 6.2 10*3/uL (ref 4.0–10.5)
WBC: 6.6 10*3/uL (ref 4.0–10.5)
WBC: 7.2 10*3/uL (ref 4.0–10.5)
WBC: 7.9 10*3/uL (ref 4.0–10.5)

## 2010-08-11 LAB — RENAL FUNCTION PANEL
Albumin: 2.7 g/dL — ABNORMAL LOW (ref 3.5–5.2)
Albumin: 3.3 g/dL — ABNORMAL LOW (ref 3.5–5.2)
BUN: 43 mg/dL — ABNORMAL HIGH (ref 6–23)
CO2: 24 mEq/L (ref 19–32)
CO2: 25 mEq/L (ref 19–32)
CO2: 28 mEq/L (ref 19–32)
CO2: 28 mEq/L (ref 19–32)
Calcium: 8.4 mg/dL (ref 8.4–10.5)
Calcium: 8.7 mg/dL (ref 8.4–10.5)
Calcium: 8.9 mg/dL (ref 8.4–10.5)
Chloride: 103 mEq/L (ref 96–112)
Chloride: 94 mEq/L — ABNORMAL LOW (ref 96–112)
Creatinine, Ser: 5.98 mg/dL — ABNORMAL HIGH (ref 0.4–1.2)
Creatinine, Ser: 6.63 mg/dL — ABNORMAL HIGH (ref 0.4–1.2)
GFR calc Af Amer: 16 mL/min — ABNORMAL LOW (ref >60)
GFR calc Af Amer: 7 mL/min — ABNORMAL LOW (ref >60)
GFR calc Af Amer: 8 mL/min — ABNORMAL LOW (ref >60)
GFR calc Af Amer: 9 mL/min — ABNORMAL LOW (ref >60)
GFR calc non Af Amer: 13 mL/min — ABNORMAL LOW (ref >60)
GFR calc non Af Amer: 6 mL/min — ABNORMAL LOW (ref >60)
GFR calc non Af Amer: 7 mL/min — ABNORMAL LOW (ref >60)
Glucose, Bld: 94 mg/dL (ref 70–99)
Glucose, Bld: 98 mg/dL (ref 70–99)
Phosphorus: 3 mg/dL (ref 2.3–4.6)
Phosphorus: 4.2 mg/dL (ref 2.3–4.6)
Phosphorus: 4.2 mg/dL (ref 2.3–4.6)
Potassium: 3.8 mEq/L (ref 3.5–5.1)
Potassium: 4 mEq/L (ref 3.5–5.1)
Potassium: 4.3 mEq/L (ref 3.5–5.1)
Sodium: 129 mEq/L — ABNORMAL LOW (ref 135–145)
Sodium: 134 mEq/L — ABNORMAL LOW (ref 135–145)
Sodium: 137 mEq/L (ref 135–145)
Sodium: 137 mEq/L (ref 135–145)

## 2010-08-11 LAB — URINALYSIS, ROUTINE W REFLEX MICROSCOPIC
Glucose, UA: 250 mg/dL — AB
Leukocytes, UA: NEGATIVE
Specific Gravity, Urine: 1.017 (ref 1.005–1.030)
pH: 8 (ref 5.0–8.0)

## 2010-08-11 LAB — BASIC METABOLIC PANEL
BUN: 15 mg/dL (ref 6–23)
BUN: 27 mg/dL — ABNORMAL HIGH (ref 6–23)
BUN: 40 mg/dL — ABNORMAL HIGH (ref 6–23)
CO2: 27 mEq/L (ref 19–32)
Calcium: 8.3 mg/dL — ABNORMAL LOW (ref 8.4–10.5)
Creatinine, Ser: 3.9 mg/dL — ABNORMAL HIGH (ref 0.4–1.2)
Creatinine, Ser: 5.33 mg/dL — ABNORMAL HIGH (ref 0.4–1.2)
Creatinine, Ser: 6.56 mg/dL — ABNORMAL HIGH (ref 0.4–1.2)
GFR calc Af Amer: 14 mL/min — ABNORMAL LOW (ref >60)
GFR calc non Af Amer: 6 mL/min — ABNORMAL LOW (ref >60)
GFR calc non Af Amer: 8 mL/min — ABNORMAL LOW (ref >60)
Glucose, Bld: 108 mg/dL — ABNORMAL HIGH (ref 70–99)
Glucose, Bld: 113 mg/dL — ABNORMAL HIGH (ref 70–99)
Glucose, Bld: 126 mg/dL — ABNORMAL HIGH (ref 70–99)
Potassium: 4.7 mEq/L (ref 3.5–5.1)
Potassium: 4.9 mEq/L (ref 3.5–5.1)

## 2010-08-11 LAB — URINE MICROSCOPIC-ADD ON

## 2010-08-11 LAB — POCT I-STAT 4, (NA,K, GLUC, HGB,HCT)
Glucose, Bld: 88 mg/dL (ref 70–99)
HCT: 38 % (ref 36.0–46.0)
HCT: 38 % (ref 36.0–46.0)
Hemoglobin: 12.9 g/dL (ref 12.0–15.0)
Potassium: 4.7 mEq/L (ref 3.5–5.1)
Sodium: 136 mEq/L (ref 135–145)

## 2010-08-11 LAB — IRON AND TIBC
Iron: 15 ug/dL — ABNORMAL LOW (ref 42–135)
TIBC: 179 ug/dL — ABNORMAL LOW (ref 250–470)
UIBC: 164 ug/dL

## 2010-08-11 LAB — DIFFERENTIAL
Basophils Absolute: 0 10*3/uL (ref 0.0–0.1)
Eosinophils Absolute: 0.6 10*3/uL (ref 0.0–0.7)
Eosinophils Relative: 8 % — ABNORMAL HIGH (ref 0–5)
Lymphocytes Relative: 12 % (ref 12–46)
Lymphs Abs: 1 10*3/uL (ref 0.7–4.0)
Neutrophils Relative %: 76 % (ref 43–77)

## 2010-08-11 LAB — PROTIME-INR
INR: 1.07 (ref 0.00–1.49)
Prothrombin Time: 13.8 seconds (ref 11.6–15.2)

## 2010-08-11 LAB — FERRITIN: Ferritin: 1177 ng/mL — ABNORMAL HIGH (ref 10–291)

## 2010-08-11 LAB — COMPREHENSIVE METABOLIC PANEL
BUN: 66 mg/dL — ABNORMAL HIGH (ref 6–23)
CO2: 23 mEq/L (ref 19–32)
Calcium: 9 mg/dL (ref 8.4–10.5)
Creatinine, Ser: 8 mg/dL — ABNORMAL HIGH (ref 0.4–1.2)
GFR calc non Af Amer: 5 mL/min — ABNORMAL LOW (ref >60)
Glucose, Bld: 101 mg/dL — ABNORMAL HIGH (ref 70–99)
Total Protein: 6.1 g/dL (ref 6.0–8.3)

## 2010-08-11 LAB — MRSA PCR SCREENING: MRSA by PCR: NEGATIVE

## 2010-08-11 LAB — TSH: TSH: 3.974 u[IU]/mL (ref 0.350–4.500)

## 2010-08-11 LAB — TYPE AND SCREEN
ABO/RH(D): O NEG
Antibody Screen: NEGATIVE

## 2010-08-11 LAB — NO BLOOD PRODUCTS

## 2010-08-11 LAB — HEMOGLOBIN A1C: Mean Plasma Glucose: 111 mg/dL (ref <117)

## 2010-08-12 LAB — DIFFERENTIAL
Basophils Absolute: 0 10*3/uL (ref 0.0–0.1)
Basophils Relative: 0 % (ref 0–1)
Eosinophils Relative: 2 % (ref 0–5)
Monocytes Absolute: 0.6 10*3/uL (ref 0.1–1.0)

## 2010-08-12 LAB — CBC
HCT: 33.7 % — ABNORMAL LOW (ref 36.0–46.0)
Hemoglobin: 11.5 g/dL — ABNORMAL LOW (ref 12.0–15.0)
MCHC: 34 g/dL (ref 30.0–36.0)
RDW: 15.7 % — ABNORMAL HIGH (ref 11.5–15.5)

## 2010-08-12 LAB — BASIC METABOLIC PANEL
CO2: 30 mEq/L (ref 19–32)
Calcium: 8.6 mg/dL (ref 8.4–10.5)
Glucose, Bld: 138 mg/dL — ABNORMAL HIGH (ref 70–99)
Potassium: 3.3 mEq/L — ABNORMAL LOW (ref 3.5–5.1)
Sodium: 138 mEq/L (ref 135–145)

## 2010-08-12 LAB — ANAEROBIC CULTURE

## 2010-08-12 LAB — GLUCOSE, CAPILLARY: Glucose-Capillary: 159 mg/dL — ABNORMAL HIGH (ref 70–99)

## 2010-08-12 LAB — WOUND CULTURE

## 2010-08-12 LAB — POCT I-STAT 4, (NA,K, GLUC, HGB,HCT)
Glucose, Bld: 137 mg/dL — ABNORMAL HIGH (ref 70–99)
Sodium: 140 mEq/L (ref 135–145)

## 2010-08-13 ENCOUNTER — Other Ambulatory Visit (HOSPITAL_COMMUNITY): Payer: Self-pay | Admitting: Nephrology

## 2010-08-13 ENCOUNTER — Ambulatory Visit: Payer: Medicare Other | Admitting: Physical Therapy

## 2010-08-13 DIAGNOSIS — N186 End stage renal disease: Secondary | ICD-10-CM

## 2010-08-13 LAB — GLUCOSE, CAPILLARY
Glucose-Capillary: 108 mg/dL — ABNORMAL HIGH (ref 70–99)
Glucose-Capillary: 112 mg/dL — ABNORMAL HIGH (ref 70–99)
Glucose-Capillary: 113 mg/dL — ABNORMAL HIGH (ref 70–99)
Glucose-Capillary: 114 mg/dL — ABNORMAL HIGH (ref 70–99)
Glucose-Capillary: 116 mg/dL — ABNORMAL HIGH (ref 70–99)
Glucose-Capillary: 117 mg/dL — ABNORMAL HIGH (ref 70–99)
Glucose-Capillary: 118 mg/dL — ABNORMAL HIGH (ref 70–99)
Glucose-Capillary: 118 mg/dL — ABNORMAL HIGH (ref 70–99)
Glucose-Capillary: 121 mg/dL — ABNORMAL HIGH (ref 70–99)
Glucose-Capillary: 136 mg/dL — ABNORMAL HIGH (ref 70–99)
Glucose-Capillary: 140 mg/dL — ABNORMAL HIGH (ref 70–99)
Glucose-Capillary: 151 mg/dL — ABNORMAL HIGH (ref 70–99)
Glucose-Capillary: 79 mg/dL (ref 70–99)
Glucose-Capillary: 85 mg/dL (ref 70–99)
Glucose-Capillary: 90 mg/dL (ref 70–99)
Glucose-Capillary: 91 mg/dL (ref 70–99)
Glucose-Capillary: 93 mg/dL (ref 70–99)
Glucose-Capillary: 94 mg/dL (ref 70–99)
Glucose-Capillary: 97 mg/dL (ref 70–99)

## 2010-08-13 LAB — COMPREHENSIVE METABOLIC PANEL
ALT: 14 U/L (ref 0–35)
AST: 22 U/L (ref 0–37)
Albumin: 2.5 g/dL — ABNORMAL LOW (ref 3.5–5.2)
Alkaline Phosphatase: 75 U/L (ref 39–117)
BUN: 55 mg/dL — ABNORMAL HIGH (ref 6–23)
Chloride: 98 mEq/L (ref 96–112)
GFR calc Af Amer: 6 mL/min — ABNORMAL LOW (ref >60)
Potassium: 4 mEq/L (ref 3.5–5.1)
Sodium: 134 mEq/L — ABNORMAL LOW (ref 135–145)
Total Bilirubin: 0.9 mg/dL (ref 0.3–1.2)
Total Protein: 5.4 g/dL — ABNORMAL LOW (ref 6.0–8.3)

## 2010-08-13 LAB — RENAL FUNCTION PANEL
CO2: 27 mEq/L (ref 19–32)
Calcium: 8.3 mg/dL — ABNORMAL LOW (ref 8.4–10.5)
Chloride: 99 mEq/L (ref 96–112)
Creatinine, Ser: 6.44 mg/dL — ABNORMAL HIGH (ref 0.4–1.2)
Creatinine, Ser: 6.78 mg/dL — ABNORMAL HIGH (ref 0.4–1.2)
GFR calc Af Amer: 7 mL/min — ABNORMAL LOW (ref >60)
GFR calc Af Amer: 8 mL/min — ABNORMAL LOW (ref >60)
GFR calc non Af Amer: 6 mL/min — ABNORMAL LOW (ref >60)
Glucose, Bld: 114 mg/dL — ABNORMAL HIGH (ref 70–99)
Glucose, Bld: 94 mg/dL (ref 70–99)
Phosphorus: 4.8 mg/dL — ABNORMAL HIGH (ref 2.3–4.6)
Sodium: 135 mEq/L (ref 135–145)
Sodium: 136 mEq/L (ref 135–145)

## 2010-08-13 LAB — CBC
HCT: 23 % — ABNORMAL LOW (ref 36.0–46.0)
Hemoglobin: 8.8 g/dL — ABNORMAL LOW (ref 12.0–15.0)
MCHC: 34.2 g/dL (ref 30.0–36.0)
MCV: 95.9 fL (ref 78.0–100.0)
MCV: 96.4 fL (ref 78.0–100.0)
Platelets: 205 10*3/uL (ref 150–400)
RBC: 2.65 MIL/uL — ABNORMAL LOW (ref 3.87–5.11)
RDW: 14.6 % (ref 11.5–15.5)
RDW: 14.8 % (ref 11.5–15.5)
WBC: 6.2 10*3/uL (ref 4.0–10.5)
WBC: 6.6 10*3/uL (ref 4.0–10.5)

## 2010-08-14 LAB — RENAL FUNCTION PANEL
CO2: 24 mEq/L (ref 19–32)
Calcium: 8.1 mg/dL — ABNORMAL LOW (ref 8.4–10.5)
Chloride: 102 mEq/L (ref 96–112)
Creatinine, Ser: 7.09 mg/dL — ABNORMAL HIGH (ref 0.4–1.2)
GFR calc Af Amer: 7 mL/min — ABNORMAL LOW (ref >60)
GFR calc non Af Amer: 6 mL/min — ABNORMAL LOW (ref >60)
Glucose, Bld: 92 mg/dL (ref 70–99)

## 2010-08-14 LAB — GLUCOSE, CAPILLARY
Glucose-Capillary: 101 mg/dL — ABNORMAL HIGH (ref 70–99)
Glucose-Capillary: 128 mg/dL — ABNORMAL HIGH (ref 70–99)
Glucose-Capillary: 140 mg/dL — ABNORMAL HIGH (ref 70–99)
Glucose-Capillary: 174 mg/dL — ABNORMAL HIGH (ref 70–99)
Glucose-Capillary: 87 mg/dL (ref 70–99)
Glucose-Capillary: 99 mg/dL (ref 70–99)
Glucose-Capillary: 99 mg/dL (ref 70–99)

## 2010-08-14 LAB — NO BLOOD PRODUCTS

## 2010-08-14 LAB — CBC
HCT: 28.6 % — ABNORMAL LOW (ref 36.0–46.0)
Hemoglobin: 9.8 g/dL — ABNORMAL LOW (ref 12.0–15.0)
MCHC: 33.7 g/dL (ref 30.0–36.0)
MCHC: 34.3 g/dL (ref 30.0–36.0)
MCV: 96 fL (ref 78.0–100.0)
MCV: 96.9 fL (ref 78.0–100.0)
RBC: 2.98 MIL/uL — ABNORMAL LOW (ref 3.87–5.11)
RDW: 15 % (ref 11.5–15.5)
RDW: 15.2 % (ref 11.5–15.5)

## 2010-08-14 LAB — POCT I-STAT 4, (NA,K, GLUC, HGB,HCT)
Glucose, Bld: 105 mg/dL — ABNORMAL HIGH (ref 70–99)
HCT: 38 % (ref 36.0–46.0)
Hemoglobin: 12.9 g/dL (ref 12.0–15.0)
Sodium: 140 mEq/L (ref 135–145)

## 2010-08-14 LAB — BLOOD GAS, ARTERIAL
Acid-Base Excess: 6.4 mmol/L — ABNORMAL HIGH (ref 0.0–2.0)
Drawn by: 181601
FIO2: 0.21 %
O2 Saturation: 96.4 %
Patient temperature: 98.6

## 2010-08-14 LAB — POCT I-STAT, CHEM 8
BUN: 24 mg/dL — ABNORMAL HIGH (ref 6–23)
Chloride: 100 mEq/L (ref 96–112)
HCT: 37 % (ref 36.0–46.0)
Hemoglobin: 12.6 g/dL (ref 12.0–15.0)
TCO2: 34 mmol/L (ref 0–100)

## 2010-08-14 LAB — COMPREHENSIVE METABOLIC PANEL
ALT: 16 U/L (ref 0–35)
AST: 17 U/L (ref 0–37)
Calcium: 8.8 mg/dL (ref 8.4–10.5)
Creatinine, Ser: 4.97 mg/dL — ABNORMAL HIGH (ref 0.4–1.2)
GFR calc Af Amer: 11 mL/min — ABNORMAL LOW (ref >60)
GFR calc non Af Amer: 9 mL/min — ABNORMAL LOW (ref >60)
Glucose, Bld: 84 mg/dL (ref 70–99)
Sodium: 138 mEq/L (ref 135–145)
Total Protein: 6.1 g/dL (ref 6.0–8.3)

## 2010-08-15 ENCOUNTER — Other Ambulatory Visit: Payer: Self-pay | Admitting: Family Medicine

## 2010-08-15 ENCOUNTER — Ambulatory Visit
Admission: RE | Admit: 2010-08-15 | Discharge: 2010-08-15 | Disposition: A | Discharge status: Home / Self Care | Payer: No Typology Code available for payment source | Source: Ambulatory Visit | Attending: Family Medicine | Admitting: Family Medicine

## 2010-08-15 ENCOUNTER — Encounter: Payer: Medicare Other | Admitting: Physical Therapy

## 2010-08-15 DIAGNOSIS — R52 Pain, unspecified: Secondary | ICD-10-CM

## 2010-08-20 ENCOUNTER — Encounter: Payer: Medicare Other | Admitting: Physical Therapy

## 2010-08-22 ENCOUNTER — Encounter: Payer: Medicare Other | Admitting: Physical Therapy

## 2010-08-29 ENCOUNTER — Other Ambulatory Visit (HOSPITAL_COMMUNITY): Payer: Self-pay | Admitting: Nephrology

## 2010-08-29 ENCOUNTER — Ambulatory Visit (HOSPITAL_COMMUNITY)
Admission: RE | Admit: 2010-08-29 | Discharge: 2010-08-29 | Disposition: A | Discharge status: Home / Self Care | Payer: Medicare (Managed Care) | Source: Ambulatory Visit | Attending: Nephrology | Admitting: Nephrology

## 2010-08-29 DIAGNOSIS — Y832 Surgical operation with anastomosis, bypass or graft as the cause of abnormal reaction of the patient, or of later complication, without mention of misadventure at the time of the procedure: Secondary | ICD-10-CM | POA: Insufficient documentation

## 2010-08-29 DIAGNOSIS — N186 End stage renal disease: Secondary | ICD-10-CM

## 2010-08-29 DIAGNOSIS — T82898A Other specified complication of vascular prosthetic devices, implants and grafts, initial encounter: Secondary | ICD-10-CM | POA: Insufficient documentation

## 2010-08-29 MED ORDER — IOHEXOL 300 MG/ML  SOLN
100.0000 mL | Freq: Once | INTRAMUSCULAR | Status: AC | PRN
  Administered 2010-08-29: 23.4 mL via INTRAVENOUS

## 2010-08-31 LAB — POCT I-STAT, CHEM 8
Calcium, Ion: 1.12 mmol/L (ref 1.12–1.32)
HCT: 44 % (ref 36.0–46.0)
Sodium: 139 mEq/L (ref 135–145)
TCO2: 30 mmol/L (ref 0–100)

## 2010-08-31 LAB — GLUCOSE, CAPILLARY: Glucose-Capillary: 176 mg/dL — ABNORMAL HIGH (ref 70–99)

## 2010-09-01 LAB — POCT I-STAT, CHEM 8
Calcium, Ion: 0.96 mmol/L — ABNORMAL LOW (ref 1.12–1.32)
HCT: 47 % — ABNORMAL HIGH (ref 36.0–46.0)
Hemoglobin: 16 g/dL — ABNORMAL HIGH (ref 12.0–15.0)
Sodium: 135 mEq/L (ref 135–145)
TCO2: 31 mmol/L (ref 0–100)

## 2010-09-01 LAB — CBC
HCT: 36.8 % (ref 36.0–46.0)
Hemoglobin: 11.7 g/dL — ABNORMAL LOW (ref 12.0–15.0)
Platelets: 264 10*3/uL (ref 150–400)
RBC: 3.95 MIL/uL (ref 3.87–5.11)
WBC: 6.2 10*3/uL (ref 4.0–10.5)

## 2010-09-01 LAB — RENAL FUNCTION PANEL
Albumin: 2.9 g/dL — ABNORMAL LOW (ref 3.5–5.2)
BUN: 20 mg/dL (ref 6–23)
CO2: 28 mEq/L (ref 19–32)
Chloride: 94 mEq/L — ABNORMAL LOW (ref 96–112)
Glucose, Bld: 118 mg/dL — ABNORMAL HIGH (ref 70–99)
Potassium: 3.7 mEq/L (ref 3.5–5.1)

## 2010-09-01 LAB — GLUCOSE, CAPILLARY
Glucose-Capillary: 127 mg/dL — ABNORMAL HIGH (ref 70–99)
Glucose-Capillary: 141 mg/dL — ABNORMAL HIGH (ref 70–99)

## 2010-09-02 LAB — BASIC METABOLIC PANEL
BUN: 32 mg/dL — ABNORMAL HIGH (ref 6–23)
BUN: 36 mg/dL — ABNORMAL HIGH (ref 6–23)
CO2: 28 mEq/L (ref 19–32)
Calcium: 8.1 mg/dL — ABNORMAL LOW (ref 8.4–10.5)
Chloride: 101 mEq/L (ref 96–112)
Chloride: 97 mEq/L (ref 96–112)
Chloride: 98 mEq/L (ref 96–112)
Creatinine, Ser: 7.8 mg/dL — ABNORMAL HIGH (ref 0.4–1.2)
GFR calc Af Amer: 9 mL/min — ABNORMAL LOW (ref >60)
GFR calc Af Amer: 9 mL/min — ABNORMAL LOW (ref >60)
GFR calc non Af Amer: 5 mL/min — ABNORMAL LOW (ref >60)
GFR calc non Af Amer: 7 mL/min — ABNORMAL LOW (ref >60)
Glucose, Bld: 76 mg/dL (ref 70–99)
Glucose, Bld: 77 mg/dL (ref 70–99)
Glucose, Bld: 86 mg/dL (ref 70–99)
Potassium: 4 mEq/L (ref 3.5–5.1)
Potassium: 4.7 mEq/L (ref 3.5–5.1)
Potassium: 4.7 mEq/L (ref 3.5–5.1)
Sodium: 133 mEq/L — ABNORMAL LOW (ref 135–145)
Sodium: 136 mEq/L (ref 135–145)
Sodium: 136 mEq/L (ref 135–145)

## 2010-09-02 LAB — GLUCOSE, CAPILLARY
Glucose-Capillary: 101 mg/dL — ABNORMAL HIGH (ref 70–99)
Glucose-Capillary: 102 mg/dL — ABNORMAL HIGH (ref 70–99)
Glucose-Capillary: 104 mg/dL — ABNORMAL HIGH (ref 70–99)
Glucose-Capillary: 106 mg/dL — ABNORMAL HIGH (ref 70–99)
Glucose-Capillary: 106 mg/dL — ABNORMAL HIGH (ref 70–99)
Glucose-Capillary: 106 mg/dL — ABNORMAL HIGH (ref 70–99)
Glucose-Capillary: 113 mg/dL — ABNORMAL HIGH (ref 70–99)
Glucose-Capillary: 115 mg/dL — ABNORMAL HIGH (ref 70–99)
Glucose-Capillary: 120 mg/dL — ABNORMAL HIGH (ref 70–99)
Glucose-Capillary: 122 mg/dL — ABNORMAL HIGH (ref 70–99)
Glucose-Capillary: 122 mg/dL — ABNORMAL HIGH (ref 70–99)
Glucose-Capillary: 125 mg/dL — ABNORMAL HIGH (ref 70–99)
Glucose-Capillary: 125 mg/dL — ABNORMAL HIGH (ref 70–99)
Glucose-Capillary: 125 mg/dL — ABNORMAL HIGH (ref 70–99)
Glucose-Capillary: 134 mg/dL — ABNORMAL HIGH (ref 70–99)
Glucose-Capillary: 134 mg/dL — ABNORMAL HIGH (ref 70–99)
Glucose-Capillary: 138 mg/dL — ABNORMAL HIGH (ref 70–99)
Glucose-Capillary: 147 mg/dL — ABNORMAL HIGH (ref 70–99)
Glucose-Capillary: 149 mg/dL — ABNORMAL HIGH (ref 70–99)
Glucose-Capillary: 158 mg/dL — ABNORMAL HIGH (ref 70–99)
Glucose-Capillary: 160 mg/dL — ABNORMAL HIGH (ref 70–99)
Glucose-Capillary: 166 mg/dL — ABNORMAL HIGH (ref 70–99)
Glucose-Capillary: 171 mg/dL — ABNORMAL HIGH (ref 70–99)
Glucose-Capillary: 181 mg/dL — ABNORMAL HIGH (ref 70–99)
Glucose-Capillary: 82 mg/dL (ref 70–99)
Glucose-Capillary: 89 mg/dL (ref 70–99)
Glucose-Capillary: 93 mg/dL (ref 70–99)
Glucose-Capillary: 94 mg/dL (ref 70–99)
Glucose-Capillary: 95 mg/dL (ref 70–99)
Glucose-Capillary: 98 mg/dL (ref 70–99)
Glucose-Capillary: 99 mg/dL (ref 70–99)
Glucose-Capillary: 100 mg/dL — ABNORMAL HIGH (ref 70–99)
Glucose-Capillary: 100 mg/dL — ABNORMAL HIGH (ref 70–99)
Glucose-Capillary: 100 mg/dL — ABNORMAL HIGH (ref 70–99)
Glucose-Capillary: 108 mg/dL — ABNORMAL HIGH (ref 70–99)
Glucose-Capillary: 111 mg/dL — ABNORMAL HIGH (ref 70–99)
Glucose-Capillary: 116 mg/dL — ABNORMAL HIGH (ref 70–99)
Glucose-Capillary: 117 mg/dL — ABNORMAL HIGH (ref 70–99)
Glucose-Capillary: 120 mg/dL — ABNORMAL HIGH (ref 70–99)
Glucose-Capillary: 129 mg/dL — ABNORMAL HIGH (ref 70–99)
Glucose-Capillary: 135 mg/dL — ABNORMAL HIGH (ref 70–99)
Glucose-Capillary: 139 mg/dL — ABNORMAL HIGH (ref 70–99)
Glucose-Capillary: 140 mg/dL — ABNORMAL HIGH (ref 70–99)
Glucose-Capillary: 143 mg/dL — ABNORMAL HIGH (ref 70–99)
Glucose-Capillary: 148 mg/dL — ABNORMAL HIGH (ref 70–99)
Glucose-Capillary: 58 mg/dL — ABNORMAL LOW (ref 70–99)
Glucose-Capillary: 61 mg/dL — ABNORMAL LOW (ref 70–99)
Glucose-Capillary: 66 mg/dL — ABNORMAL LOW (ref 70–99)
Glucose-Capillary: 70 mg/dL (ref 70–99)
Glucose-Capillary: 75 mg/dL (ref 70–99)
Glucose-Capillary: 76 mg/dL (ref 70–99)
Glucose-Capillary: 77 mg/dL (ref 70–99)
Glucose-Capillary: 80 mg/dL (ref 70–99)
Glucose-Capillary: 82 mg/dL (ref 70–99)
Glucose-Capillary: 84 mg/dL (ref 70–99)
Glucose-Capillary: 84 mg/dL (ref 70–99)
Glucose-Capillary: 86 mg/dL (ref 70–99)
Glucose-Capillary: 86 mg/dL (ref 70–99)
Glucose-Capillary: 87 mg/dL (ref 70–99)
Glucose-Capillary: 93 mg/dL (ref 70–99)
Glucose-Capillary: 95 mg/dL (ref 70–99)
Glucose-Capillary: 96 mg/dL (ref 70–99)
Glucose-Capillary: 98 mg/dL (ref 70–99)
Glucose-Capillary: 99 mg/dL (ref 70–99)

## 2010-09-02 LAB — COMPREHENSIVE METABOLIC PANEL
ALT: 16 U/L (ref 0–35)
ALT: 12 U/L (ref 0–35)
AST: 20 U/L (ref 0–37)
AST: 15 U/L (ref 0–37)
AST: 44 U/L — ABNORMAL HIGH (ref 0–37)
Albumin: 2.4 g/dL — ABNORMAL LOW (ref 3.5–5.2)
Alkaline Phosphatase: 58 U/L (ref 39–117)
BUN: 26 mg/dL — ABNORMAL HIGH (ref 6–23)
CO2: 28 mEq/L (ref 19–32)
CO2: 25 mEq/L (ref 19–32)
CO2: 26 mEq/L (ref 19–32)
Calcium: 8.5 mg/dL (ref 8.4–10.5)
Calcium: 8 mg/dL — ABNORMAL LOW (ref 8.4–10.5)
Calcium: 8.2 mg/dL — ABNORMAL LOW (ref 8.4–10.5)
Chloride: 98 mEq/L (ref 96–112)
Chloride: 95 mEq/L — ABNORMAL LOW (ref 96–112)
Creatinine, Ser: 7.33 mg/dL — ABNORMAL HIGH (ref 0.4–1.2)
Creatinine, Ser: 6.01 mg/dL — ABNORMAL HIGH (ref 0.4–1.2)
GFR calc Af Amer: 7 mL/min — ABNORMAL LOW (ref >60)
GFR calc Af Amer: 6 mL/min — ABNORMAL LOW (ref >60)
GFR calc non Af Amer: 5 mL/min — ABNORMAL LOW (ref >60)
GFR calc non Af Amer: 7 mL/min — ABNORMAL LOW (ref >60)
Glucose, Bld: 118 mg/dL — ABNORMAL HIGH (ref 70–99)
Glucose, Bld: 74 mg/dL (ref 70–99)
Potassium: 4.7 mEq/L (ref 3.5–5.1)
Sodium: 136 mEq/L (ref 135–145)
Sodium: 132 mEq/L — ABNORMAL LOW (ref 135–145)
Total Bilirubin: 0.6 mg/dL (ref 0.3–1.2)
Total Bilirubin: 0.8 mg/dL (ref 0.3–1.2)
Total Protein: 5 g/dL — ABNORMAL LOW (ref 6.0–8.3)

## 2010-09-02 LAB — RENAL FUNCTION PANEL
Albumin: 2.2 g/dL — ABNORMAL LOW (ref 3.5–5.2)
Albumin: 2.2 g/dL — ABNORMAL LOW (ref 3.5–5.2)
Albumin: 2.7 g/dL — ABNORMAL LOW (ref 3.5–5.2)
Albumin: 1.8 g/dL — ABNORMAL LOW (ref 3.5–5.2)
Albumin: 2.3 g/dL — ABNORMAL LOW (ref 3.5–5.2)
BUN: 21 mg/dL (ref 6–23)
BUN: 43 mg/dL — ABNORMAL HIGH (ref 6–23)
BUN: 56 mg/dL — ABNORMAL HIGH (ref 6–23)
CO2: 26 mEq/L (ref 19–32)
CO2: 27 mEq/L (ref 19–32)
CO2: 27 mEq/L (ref 19–32)
CO2: 25 mEq/L (ref 19–32)
Calcium: 8.4 mg/dL (ref 8.4–10.5)
Calcium: 7.9 mg/dL — ABNORMAL LOW (ref 8.4–10.5)
Calcium: 8 mg/dL — ABNORMAL LOW (ref 8.4–10.5)
Calcium: 8.3 mg/dL — ABNORMAL LOW (ref 8.4–10.5)
Chloride: 96 mEq/L (ref 96–112)
Chloride: 99 mEq/L (ref 96–112)
Chloride: 95 mEq/L — ABNORMAL LOW (ref 96–112)
Creatinine, Ser: 7.39 mg/dL — ABNORMAL HIGH (ref 0.4–1.2)
Creatinine, Ser: 8.83 mg/dL — ABNORMAL HIGH (ref 0.4–1.2)
Creatinine, Ser: 5.75 mg/dL — ABNORMAL HIGH (ref 0.4–1.2)
Creatinine, Ser: 7.67 mg/dL — ABNORMAL HIGH (ref 0.4–1.2)
Creatinine, Ser: 8.74 mg/dL — ABNORMAL HIGH (ref 0.4–1.2)
Creatinine, Ser: 9.32 mg/dL — ABNORMAL HIGH (ref 0.4–1.2)
GFR calc Af Amer: 6 mL/min — ABNORMAL LOW (ref >60)
GFR calc Af Amer: 7 mL/min — ABNORMAL LOW (ref >60)
GFR calc Af Amer: 5 mL/min — ABNORMAL LOW (ref >60)
GFR calc Af Amer: 7 mL/min — ABNORMAL LOW (ref >60)
GFR calc non Af Amer: 5 mL/min — ABNORMAL LOW (ref >60)
GFR calc non Af Amer: 6 mL/min — ABNORMAL LOW (ref >60)
GFR calc non Af Amer: 4 mL/min — ABNORMAL LOW (ref >60)
GFR calc non Af Amer: 5 mL/min — ABNORMAL LOW (ref >60)
Glucose, Bld: 128 mg/dL — ABNORMAL HIGH (ref 70–99)
Glucose, Bld: 81 mg/dL (ref 70–99)
Glucose, Bld: 103 mg/dL — ABNORMAL HIGH (ref 70–99)
Phosphorus: 4.7 mg/dL — ABNORMAL HIGH (ref 2.3–4.6)
Phosphorus: 4 mg/dL (ref 2.3–4.6)
Phosphorus: 5.4 mg/dL — ABNORMAL HIGH (ref 2.3–4.6)
Potassium: 3.7 mEq/L (ref 3.5–5.1)
Potassium: 4.1 mEq/L (ref 3.5–5.1)
Sodium: 136 mEq/L (ref 135–145)
Sodium: 137 mEq/L (ref 135–145)
Sodium: 137 mEq/L (ref 135–145)
Sodium: 139 mEq/L (ref 135–145)
Sodium: 135 mEq/L (ref 135–145)

## 2010-09-02 LAB — CBC
HCT: 29.1 % — ABNORMAL LOW (ref 36.0–46.0)
HCT: 30.8 % — ABNORMAL LOW (ref 36.0–46.0)
HCT: 33.3 % — ABNORMAL LOW (ref 36.0–46.0)
HCT: 36.5 % (ref 36.0–46.0)
HCT: 25.8 % — ABNORMAL LOW (ref 36.0–46.0)
HCT: 27.2 % — ABNORMAL LOW (ref 36.0–46.0)
HCT: 27.5 % — ABNORMAL LOW (ref 36.0–46.0)
HCT: 28.3 % — ABNORMAL LOW (ref 36.0–46.0)
HCT: 28.6 % — ABNORMAL LOW (ref 36.0–46.0)
HCT: 29.2 % — ABNORMAL LOW (ref 36.0–46.0)
HCT: 29.3 % — ABNORMAL LOW (ref 36.0–46.0)
HCT: 31.3 % — ABNORMAL LOW (ref 36.0–46.0)
HCT: 32.9 % — ABNORMAL LOW (ref 36.0–46.0)
Hemoglobin: 10.2 g/dL — ABNORMAL LOW (ref 12.0–15.0)
Hemoglobin: 10.5 g/dL — ABNORMAL LOW (ref 12.0–15.0)
Hemoglobin: 10.5 g/dL — ABNORMAL LOW (ref 12.0–15.0)
Hemoglobin: 9.6 g/dL — ABNORMAL LOW (ref 12.0–15.0)
Hemoglobin: 9.8 g/dL — ABNORMAL LOW (ref 12.0–15.0)
Hemoglobin: 10.2 g/dL — ABNORMAL LOW (ref 12.0–15.0)
Hemoglobin: 10.6 g/dL — ABNORMAL LOW (ref 12.0–15.0)
Hemoglobin: 8.4 g/dL — ABNORMAL LOW (ref 12.0–15.0)
Hemoglobin: 8.6 g/dL — ABNORMAL LOW (ref 12.0–15.0)
Hemoglobin: 8.8 g/dL — ABNORMAL LOW (ref 12.0–15.0)
Hemoglobin: 8.9 g/dL — ABNORMAL LOW (ref 12.0–15.0)
Hemoglobin: 9.2 g/dL — ABNORMAL LOW (ref 12.0–15.0)
Hemoglobin: 9.3 g/dL — ABNORMAL LOW (ref 12.0–15.0)
Hemoglobin: 9.4 g/dL — ABNORMAL LOW (ref 12.0–15.0)
MCHC: 31.3 g/dL (ref 30.0–36.0)
MCHC: 31.4 g/dL (ref 30.0–36.0)
MCHC: 31.8 g/dL (ref 30.0–36.0)
MCHC: 31.5 g/dL (ref 30.0–36.0)
MCHC: 31.9 g/dL (ref 30.0–36.0)
MCHC: 31.9 g/dL (ref 30.0–36.0)
MCHC: 32.2 g/dL (ref 30.0–36.0)
MCHC: 32.3 g/dL (ref 30.0–36.0)
MCHC: 32.4 g/dL (ref 30.0–36.0)
MCHC: 32.5 g/dL (ref 30.0–36.0)
MCHC: 32.6 g/dL (ref 30.0–36.0)
MCHC: 32.7 g/dL (ref 30.0–36.0)
MCV: 90.2 fL (ref 78.0–100.0)
MCV: 90.6 fL (ref 78.0–100.0)
MCV: 91.2 fL (ref 78.0–100.0)
MCV: 91.3 fL (ref 78.0–100.0)
MCV: 92 fL (ref 78.0–100.0)
MCV: 92.1 fL (ref 78.0–100.0)
MCV: 92.5 fL (ref 78.0–100.0)
MCV: 93.1 fL (ref 78.0–100.0)
MCV: 88.5 fL (ref 78.0–100.0)
MCV: 89.1 fL (ref 78.0–100.0)
MCV: 89.4 fL (ref 78.0–100.0)
MCV: 89.7 fL (ref 78.0–100.0)
MCV: 90.3 fL (ref 78.0–100.0)
MCV: 90.5 fL (ref 78.0–100.0)
MCV: 90.5 fL (ref 78.0–100.0)
MCV: 91.1 fL (ref 78.0–100.0)
MCV: 91.4 fL (ref 78.0–100.0)
MCV: 91.5 fL (ref 78.0–100.0)
Platelets: 232 10*3/uL (ref 150–400)
Platelets: 268 10*3/uL (ref 150–400)
Platelets: 276 10*3/uL (ref 150–400)
Platelets: 302 10*3/uL (ref 150–400)
Platelets: 311 10*3/uL (ref 150–400)
Platelets: 317 10*3/uL (ref 150–400)
Platelets: 257 10*3/uL (ref 150–400)
Platelets: 259 10*3/uL (ref 150–400)
Platelets: 286 10*3/uL (ref 150–400)
Platelets: 328 10*3/uL (ref 150–400)
Platelets: 338 10*3/uL (ref 150–400)
Platelets: 341 10*3/uL (ref 150–400)
RBC: 3.12 MIL/uL — ABNORMAL LOW (ref 3.87–5.11)
RBC: 3.17 MIL/uL — ABNORMAL LOW (ref 3.87–5.11)
RBC: 3.26 MIL/uL — ABNORMAL LOW (ref 3.87–5.11)
RBC: 3.33 MIL/uL — ABNORMAL LOW (ref 3.87–5.11)
RBC: 2.97 MIL/uL — ABNORMAL LOW (ref 3.87–5.11)
RBC: 2.98 MIL/uL — ABNORMAL LOW (ref 3.87–5.11)
RBC: 3.11 MIL/uL — ABNORMAL LOW (ref 3.87–5.11)
RBC: 3.12 MIL/uL — ABNORMAL LOW (ref 3.87–5.11)
RBC: 3.13 MIL/uL — ABNORMAL LOW (ref 3.87–5.11)
RBC: 3.15 MIL/uL — ABNORMAL LOW (ref 3.87–5.11)
RBC: 3.16 MIL/uL — ABNORMAL LOW (ref 3.87–5.11)
RBC: 3.21 MIL/uL — ABNORMAL LOW (ref 3.87–5.11)
RBC: 3.5 MIL/uL — ABNORMAL LOW (ref 3.87–5.11)
RDW: 20.7 % — ABNORMAL HIGH (ref 11.5–15.5)
RDW: 21 % — ABNORMAL HIGH (ref 11.5–15.5)
RDW: 21.2 % — ABNORMAL HIGH (ref 11.5–15.5)
RDW: 21.4 % — ABNORMAL HIGH (ref 11.5–15.5)
RDW: 20 % — ABNORMAL HIGH (ref 11.5–15.5)
RDW: 20.4 % — ABNORMAL HIGH (ref 11.5–15.5)
RDW: 20.8 % — ABNORMAL HIGH (ref 11.5–15.5)
RDW: 20.9 % — ABNORMAL HIGH (ref 11.5–15.5)
RDW: 21 % — ABNORMAL HIGH (ref 11.5–15.5)
RDW: 21 % — ABNORMAL HIGH (ref 11.5–15.5)
WBC: 10 10*3/uL (ref 4.0–10.5)
WBC: 10.6 10*3/uL — ABNORMAL HIGH (ref 4.0–10.5)
WBC: 5.2 10*3/uL (ref 4.0–10.5)
WBC: 6 10*3/uL (ref 4.0–10.5)
WBC: 6.9 10*3/uL (ref 4.0–10.5)
WBC: 7.8 10*3/uL (ref 4.0–10.5)
WBC: 9.5 10*3/uL (ref 4.0–10.5)
WBC: 10.8 10*3/uL — ABNORMAL HIGH (ref 4.0–10.5)
WBC: 11 10*3/uL — ABNORMAL HIGH (ref 4.0–10.5)
WBC: 12.2 10*3/uL — ABNORMAL HIGH (ref 4.0–10.5)
WBC: 12.5 10*3/uL — ABNORMAL HIGH (ref 4.0–10.5)
WBC: 12.5 10*3/uL — ABNORMAL HIGH (ref 4.0–10.5)
WBC: 13.5 10*3/uL — ABNORMAL HIGH (ref 4.0–10.5)
WBC: 14.4 10*3/uL — ABNORMAL HIGH (ref 4.0–10.5)

## 2010-09-02 LAB — HEPARIN LEVEL (UNFRACTIONATED)
Heparin Unfractionated: 0.17 IU/mL — ABNORMAL LOW (ref 0.30–0.70)
Heparin Unfractionated: 0.19 IU/mL — ABNORMAL LOW (ref 0.30–0.70)
Heparin Unfractionated: <0.1 IU/mL — ABNORMAL LOW (ref 0.30–0.70)
Heparin Unfractionated: <0.1 IU/mL — ABNORMAL LOW (ref 0.30–0.70)

## 2010-09-02 LAB — PHOSPHORUS
Phosphorus: 5 mg/dL — ABNORMAL HIGH (ref 2.3–4.6)
Phosphorus: 5.5 mg/dL — ABNORMAL HIGH (ref 2.3–4.6)

## 2010-09-02 LAB — IRON AND TIBC
Saturation Ratios: 27 % (ref 20–55)
Saturation Ratios: 8 % — ABNORMAL LOW (ref 20–55)
TIBC: 143 ug/dL — ABNORMAL LOW (ref 250–470)
UIBC: 120 ug/dL
UIBC: 131 ug/dL

## 2010-09-02 LAB — HEPATITIS B SURFACE ANTIGEN: Hepatitis B Surface Ag: NEGATIVE

## 2010-09-02 LAB — PTH, INTACT AND CALCIUM: PTH: 84.4 pg/mL — ABNORMAL HIGH (ref 14.0–72.0)

## 2010-09-02 LAB — TSH: TSH: 4.987 u[IU]/mL — ABNORMAL HIGH (ref 0.350–4.500)

## 2010-09-02 LAB — CULTURE, BLOOD (ROUTINE X 2): Culture: NO GROWTH

## 2010-09-02 LAB — VANCOMYCIN, RANDOM: Vancomycin Rm: 26.6 ug/mL

## 2010-09-03 LAB — CBC
HCT: 33.4 % — ABNORMAL LOW (ref 36.0–46.0)
Hemoglobin: 11.3 g/dL — ABNORMAL LOW (ref 12.0–15.0)
Hemoglobin: 9.9 g/dL — ABNORMAL LOW (ref 12.0–15.0)
MCHC: 32 g/dL (ref 30.0–36.0)
MCHC: 32.3 g/dL (ref 30.0–36.0)
MCV: 91.2 fL (ref 78.0–100.0)
Platelets: 277 10*3/uL (ref 150–400)
RBC: 3.34 MIL/uL — ABNORMAL LOW (ref 3.87–5.11)
RBC: 3.54 MIL/uL — ABNORMAL LOW (ref 3.87–5.11)
RDW: 21.4 % — ABNORMAL HIGH (ref 11.5–15.5)
RDW: 22.1 % — ABNORMAL HIGH (ref 11.5–15.5)
WBC: 10.2 10*3/uL (ref 4.0–10.5)
WBC: 10.8 10*3/uL — ABNORMAL HIGH (ref 4.0–10.5)

## 2010-09-03 LAB — BASIC METABOLIC PANEL
CO2: 27 mEq/L (ref 19–32)
Calcium: 9 mg/dL (ref 8.4–10.5)
Creatinine, Ser: 7.66 mg/dL — ABNORMAL HIGH (ref 0.4–1.2)
GFR calc Af Amer: 7 mL/min — ABNORMAL LOW (ref >60)

## 2010-09-03 LAB — GLUCOSE, CAPILLARY
Glucose-Capillary: 53 mg/dL — ABNORMAL LOW (ref 70–99)
Glucose-Capillary: 69 mg/dL — ABNORMAL LOW (ref 70–99)
Glucose-Capillary: 69 mg/dL — ABNORMAL LOW (ref 70–99)
Glucose-Capillary: 74 mg/dL (ref 70–99)
Glucose-Capillary: 78 mg/dL (ref 70–99)
Glucose-Capillary: 81 mg/dL (ref 70–99)
Glucose-Capillary: 82 mg/dL (ref 70–99)
Glucose-Capillary: 87 mg/dL (ref 70–99)

## 2010-09-03 LAB — COMPREHENSIVE METABOLIC PANEL
Alkaline Phosphatase: 83 U/L (ref 39–117)
BUN: 26 mg/dL — ABNORMAL HIGH (ref 6–23)
Glucose, Bld: 63 mg/dL — ABNORMAL LOW (ref 70–99)
Potassium: 5.2 mEq/L — ABNORMAL HIGH (ref 3.5–5.1)
Total Bilirubin: 1.3 mg/dL — ABNORMAL HIGH (ref 0.3–1.2)
Total Protein: 6.6 g/dL (ref 6.0–8.3)

## 2010-09-03 LAB — DIFFERENTIAL
Basophils Absolute: 0 10*3/uL (ref 0.0–0.1)
Basophils Relative: 0 % (ref 0–1)
Eosinophils Absolute: 0.3 10*3/uL (ref 0.0–0.7)
Lymphocytes Relative: 15 % (ref 12–46)
Neutrophils Relative %: 73 % (ref 43–77)

## 2010-09-03 LAB — PROTIME-INR: Prothrombin Time: 13.9 seconds (ref 11.6–15.2)

## 2010-09-03 LAB — RENAL FUNCTION PANEL
Albumin: 2.3 g/dL — ABNORMAL LOW (ref 3.5–5.2)
Chloride: 98 mEq/L (ref 96–112)
GFR calc Af Amer: 5 mL/min — ABNORMAL LOW (ref >60)
GFR calc non Af Amer: 4 mL/min — ABNORMAL LOW (ref >60)
Potassium: 6 mEq/L — ABNORMAL HIGH (ref 3.5–5.1)
Sodium: 132 mEq/L — ABNORMAL LOW (ref 135–145)

## 2010-09-03 LAB — CULTURE, BLOOD (ROUTINE X 2)

## 2010-09-03 LAB — HEPARIN LEVEL (UNFRACTIONATED)
Heparin Unfractionated: 0.13 IU/mL — ABNORMAL LOW (ref 0.30–0.70)
Heparin Unfractionated: 0.32 IU/mL (ref 0.30–0.70)

## 2010-09-04 LAB — COMPREHENSIVE METABOLIC PANEL
ALT: 22 U/L (ref 0–35)
Albumin: 2.8 g/dL — ABNORMAL LOW (ref 3.5–5.2)
Alkaline Phosphatase: 69 U/L (ref 39–117)
BUN: 36 mg/dL — ABNORMAL HIGH (ref 6–23)
Chloride: 105 mEq/L (ref 96–112)
Glucose, Bld: 144 mg/dL — ABNORMAL HIGH (ref 70–99)
Potassium: 2.7 mEq/L — CL (ref 3.5–5.1)
Sodium: 137 mEq/L (ref 135–145)
Total Bilirubin: 0.7 mg/dL (ref 0.3–1.2)

## 2010-09-04 LAB — CBC
RBC: 3.57 MIL/uL — ABNORMAL LOW (ref 3.87–5.11)
RDW: 16 % — ABNORMAL HIGH (ref 11.5–15.5)
WBC: 10.3 10*3/uL (ref 4.0–10.5)

## 2010-09-04 LAB — DIFFERENTIAL
Basophils Absolute: 0.1 10*3/uL (ref 0.0–0.1)
Eosinophils Relative: 5 % (ref 0–5)
Lymphocytes Relative: 10 % — ABNORMAL LOW (ref 12–46)
Lymphs Abs: 1.1 10*3/uL (ref 0.7–4.0)
Monocytes Absolute: 0.4 10*3/uL (ref 0.1–1.0)

## 2010-09-05 LAB — POCT I-STAT 4, (NA,K, GLUC, HGB,HCT)
Glucose, Bld: 131 mg/dL — ABNORMAL HIGH (ref 70–99)
HCT: 33 % — ABNORMAL LOW (ref 36.0–46.0)

## 2010-09-05 LAB — GLUCOSE, CAPILLARY: Glucose-Capillary: 137 mg/dL — ABNORMAL HIGH (ref 70–99)

## 2010-09-05 LAB — NO BLOOD PRODUCTS

## 2010-09-09 LAB — IRON AND TIBC: Saturation Ratios: 20 % (ref 20–55)

## 2010-09-09 LAB — POCT HEMOGLOBIN-HEMACUE: Hemoglobin: 10.3 g/dL — ABNORMAL LOW (ref 12.0–15.0)

## 2010-09-10 LAB — IRON AND TIBC
Saturation Ratios: 23 % (ref 20–55)
UIBC: 158 ug/dL

## 2010-09-10 LAB — POCT HEMOGLOBIN-HEMACUE: Hemoglobin: 10.2 g/dL — ABNORMAL LOW (ref 12.0–15.0)

## 2010-09-19 ENCOUNTER — Encounter (INDEPENDENT_AMBULATORY_CARE_PROVIDER_SITE_OTHER): Payer: PRIVATE HEALTH INSURANCE

## 2010-09-19 ENCOUNTER — Ambulatory Visit (INDEPENDENT_AMBULATORY_CARE_PROVIDER_SITE_OTHER): Payer: PRIVATE HEALTH INSURANCE | Admitting: Vascular Surgery

## 2010-09-19 DIAGNOSIS — I739 Peripheral vascular disease, unspecified: Secondary | ICD-10-CM

## 2010-09-19 DIAGNOSIS — Z48812 Encounter for surgical aftercare following surgery on the circulatory system: Secondary | ICD-10-CM

## 2010-09-19 DIAGNOSIS — I7092 Chronic total occlusion of artery of the extremities: Secondary | ICD-10-CM

## 2010-09-20 NOTE — Assessment & Plan Note (Signed)
OFFICE VISIT  KAIJAH, ABTS DOB:  [birth year]                                       09/19/2010 [id number]#:[id number]  The patient is a 65 year old female that we have followed for some time for hemodialysis access as well as peripheral arterial occlusive disease.  She returns today for further followup.  Her most recent access procedure was a right basilic vein transposition in December of 2011.  She apparently has been having some problems with this with infiltration and poor flows.  She recently had a Diatek catheter inserted to rest the fistula for a few weeks.  She had a fistulogram performed at [facility] on April 5 which showed uniform diameter throughout the entire fistula which was widely patent with no central vein stenosis.  Additionally, we have followed her for peripheral arterial disease.  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  She has also had a right fem-pop bypass in April of 2011.  She denies any claudication symptoms.  She is walking well on an artificial limb although she is getting this resized currently.  She has no ulcerations on the foot.  REVIEW OF SYSTEMS:  She denied denies any shortness of breath or chest pain.  PHYSICAL EXAM:  Vital signs:  Blood pressure is 142/60 in the left arm, heart rate is 74 and regular.  Temperature is 98.1.  Right upper arm has an easily palpable thrill in AV fistula.  She has no evidence of steal in the right arm.  The fistula is palpable over most of the course of the upper arm.  Right lower extremity, the foot is pink and warm and well-perfused.  I am unable to palpate distal pulses.  However, review of her completion arteriogram showed fairly diffuse small tibial vessel disease.  As far as her access is concerned she has a functioning access currently.  The diameter appears reasonable enough on fistulogram that it should be ready for use.  I believe the best option for this  is to give it rest for a few more weeks and try to recannulate it again in mid May.  If they are still having difficulty we will see her back for further evaluation of this.  As far as her lower extremity is concerned she did have an arterial duplex exam today that showed a patent bypass.  She had a TBI on the right side of 0.69.  She did again have evidence of stenosis over tibia peroneal trunk but I do not believe that this can be revised further. Hopefully the graft will continue to maintain its patency long-term.  Overall I believe the patient is doing well as far as her PAD and her dialysis access is concerned.  She will follow up with [place] with a graft scan in 1 year's time.  She will follow up for other access problems as needed at the request of the renal service.    Janetta Hora. Keston Seever, MD Electronically Signed  CEF/MEDQ  D:  09/19/2010  T:  09/20/2010  Job:  [id number]  cc:   [facility] Jethro Bastos, M.D.

## 2010-09-27 NOTE — Procedures (Unsigned)
BYPASS GRAFT EVALUATION  INDICATION:  Follow up peripheral arterial disease.  HISTORY: Diabetes:  Yes. Cardiac: Hypertension:  Yes. Smoking:  Previous. Previous Surgery:  Right femoral-to-popliteal bypass with nonreversed greater saphenous vein and popliteal endarterectomy on 09/04/2009.  SINGLE LEVEL ARTERIAL EXAM                              RIGHT              LEFT Brachial:                    N/A (AVF)          167 Anterior tibial:             254 - N/C Posterior tibial:            115 Peroneal: Ankle/brachial index:        0.69  TOE BRACHIAL INDEX  RIGHT:  0.61  LEFT: below knee amputation  PREVIOUS ABI:  Date: 09/26/2009  RIGHT:  Suprasystolic  LEFT:  Below- knee amputation  PREVIOUS TBI:  Date:  09/26/2009  RIGHT:  0.33  LEFT:  Below-knee amputation  LOWER EXTREMITY BYPASS GRAFT DUPLEX EXAM:  DUPLEX:  50% to 75% stenosis present involving the right proximal posterior tibial and peroneal arteries.  IMPRESSION: 1. Patent right femoral-to-popliteal bypass graft. 2. Dilatation present involving the right proximal anastomosis of the     graft, measuring 1.21 cm X 1.41 cm. 3. Right tibioperoneal trunk stenosis as noted above. 4. Ankle brachial index decreased since previous study on 09/26/2009. 5. Toe brachial index improved since previous study on 09/26/2009.  ___________________________________________ Janetta Hora. Fields, MD  SH/MEDQ  D:  09/19/2010  T:  09/19/2010  Job:  [id number]

## 2010-10-08 NOTE — Assessment & Plan Note (Signed)
OFFICE VISIT   [name], [name]  DOB:  12-26-1945                                       10/20/2008  ZOXWR#:[id number]   The patient presents today for evaluation of ulcerations over her left  toes.  She had been followed by Dr. Fabienne Bruns for a similar episode  6 months ago.  She had undergone a left lower extremity arteriogram in  preparation for potential need for bypass.  She, fortunately, was able  to heal her toes at that time without bypass.  She was approaching renal  failure at that time and is now on chronic hemodialysis on Monday,  Wednesday and Friday.  She presents today as an add-on after seeing Dr.  Charlsie Merles with progressive ulcerations over her left foot.  She does have  ischemia of her toes of her left foot and also ulceration over her left  second toe.  She has mottling over the other toes of her foot.  Her  ankle arm index is not reliable due to noncompressible vessels.  She  does have worsening toe pressures on the left foot in her prior study 6  months ago.  I reviewed her arteriogram from October, this does show  subtotal occlusion throughout her distal superficial, femoral and  popliteal artery with reconstitution of perineal and anterior tibial  artery on the left.  I will discuss this with Dr. Darrick Penna but I did  explain to the patient that she in all likelihood will require bypass  for limb salvage.  We will coordinate this following her review by Dr.  Darrick Penna.   Larina Earthly, M.D.  Electronically Signed   TFE/MEDQ  D:  10/20/2008  T:  10/24/2008  Job:  [id number]   cc:   [facility]  Lenn Sink, D.P.M.  Janetta Hora. Darrick Penna, MD

## 2010-10-08 NOTE — Assessment & Plan Note (Signed)
OFFICE VISIT   [name], [name]  DOB:  Mar 15, 1946                                       09/26/2009  CBJSE#:[id number]   The patient returns for followup today.  She underwent a right femoral  to below knee popliteal bypass with vein and a right popliteal  endarterectomy on April 12.  She is now 3 weeks out from that operation.  She currently is continuing to take Plavix.  She states that her right  foot rest pain has completely resolved at this point.  She had ABIs  performed today which showed noncompressible vessels on the right side.  She had monophasic posterior tibial, anterior tibial Doppler flow which  was brisk in character.   PHYSICAL EXAM:  Today the blood pressure is 150/79 in the right arm,  heart rate is 81 and regular.  Temperature is 97.9.  Right lower  extremity incisions are all well approximated.  Right foot is warm and  well-perfused.  She does have some edema in the lower extremity most  likely secondary to reperfusion.   We will remove half her staples today and half of them next week.  She  will be due for a protocol scan 3 months out from her operation.  Overall she appears to be doing well so far with a patent bypass graft  and her rest pain is now resolved.     Janetta Hora. Fields, MD  Electronically Signed   CEF/MEDQ  D:  09/26/2009  T:  09/27/2009  Job:  [id number]

## 2010-10-08 NOTE — Assessment & Plan Note (Signed)
OFFICE VISIT   [name], [name]  DOB:  Nov 02, 1945                                       03/15/2008  FAOZH#:[id number]   The patient returns for followup today after placement of a left forearm  AV graft on September 25.  She was having some drainage from the  incision.  The drainage has now ceased.  She states she has apparently 5  doses of antibiotics left on dialysis.  She denies any fever or chills.  There is still a small 1 mm sinus tract which I was not able to express  any fluid from today.  The remainder of the graft looks like it is  healing well.  There is no erythema.  We were considering also doing a  left femoral popliteal bypass for a nonhealing wound on the left foot.  She does still have an ulcer on the tip of the left third toe.  However,  this is also shrinking in size and I believe that we can certainly wait  to revascularize her leg until left arm is completely healed.  Today the  ulcer on the toe is 0.5 x 0.5 cm.  There is no surrounding erythema or  evidence of infection.  The patient will return for followup in two  weeks' time.  On exam of the graft.  There is an audible bruit.   Janetta Hora. Fields, MD  Electronically Signed   CEF/MEDQ  D:  03/15/2008  T:  03/16/2008  Job:  [id number]   cc:   [facility]

## 2010-10-08 NOTE — Discharge Summary (Signed)
[name], [name]                ACCOUNT NO.:  [id number]   MEDICAL RECORD NO.:  [id number]          PATIENT TYPE:  INP   LOCATION:  6707                         FACILITY:  [facility]   PHYSICIAN:  Janetta Hora. Fields, MD  DATE OF BIRTH:  07-10-1945   DATE OF ADMISSION:  10/21/2008  DATE OF DISCHARGE:                               DISCHARGE SUMMARY   FINAL DISCHARGE DIAGNOSES:  1. Ischemia of left foot with gangrene.  2. End-stage renal disease.  3. Diabetes mellitus.  4. Hyperlipidemia.  5. Hypertension.  6. Fibromyalgia.  7. Hypothyroidism.  8. Gastroesophageal reflux disease.   PROCEDURES PERFORMED:  1. On October 24, 2008, aortogram with left lower extremity runoff.  2. On October 26, 2008, left femoral to below-knee popliteal bypass with      non-reverse greater saphenous vein.  3. Amputation of left fourth toe.  4. Intraoperative arteriogram x1 by Dr. Darrick Penna.  5. On November 06, 2008, left below-knee amputation by Dr. Darrick Penna.  6. Multiple episodes of hemodialysis by the Renal Service.   COMPLICATIONS:  None.   CONDITION AT DISCHARGE:  Stable, improving.   DISCHARGE MEDICINES:  1. Zocor 40 mg p.o. q.1800.  2. Colace 100 mg p.o. daily.  3. Sliding scale insulin.  4. Plavix 75 mg p.o. daily.  5. Nephro-Vite 1 p.o. at bedtime.  6. Aranesp 200 mcg Mondays with hemodialysis.  7. Heparin 5000 units subcu q.8 h. for DVT prophylaxis.  8. Nystatin 5 mL swish and swallow q.i.d.  9. Synthroid 112 mcg p.o. daily.  10.PhosLo 667 mg p.o. t.i.d. with meals.  11.Lantus insulin 30 units at bedtime.  12.Venofer 100 mg IV Mondays with hemodialysis.  13.Nephro with __________ liquid 230 mL p.o. b.i.d.  14.Xanax 0.25 mg p.o. b.i.d. p.r.n. anxiety.  15.Zofran 4 mg IV q.6 h. p.r.n. nausea.  16.Hemodialysis protocol per Renal Services.  17.Oxycodone 5/325 p.o. q.4 h. p.r.n. pain.   DISPOSITION:  She is to be transferred to Rehabilitative Medicine when  that is available.   BRIEF IDENTIFYING  STATEMENT:  For complete details, please refer to the  history and physical.  Briefly, this 65 year old woman was evaluated by  Dr. Darrick Penna.  She was found to have an ischemic left lower extremity with  evidence of gangrene.  She was having rest pain.  She was admitted for  evaluation and pain control.   HOSPITAL COURSE:  She was admitted to a bed on Renal Service.  Her pain  was controlled with morphine.  She was placed on a heparin infusion.  She was found to have gangrene of her left foot.  On October 24, 2008, she  underwent aortogram with left lower extremity runoff, which revealed  findings consistent with diffuse left superficial femoral artery and  popliteal disease.  Dr. Darrick Penna recommended left femoral-to-popliteal  bypass grafting.  She was informed of the risks and benefits and after  careful consideration, elected to proceed with surgery.  On October 26, 2008, she was taken to the operating room and underwent a left femoral-  to-below-knee popliteal bypass grafting.  For complete details, please  refer  to the typed operative report.  The procedure was without  complications.  She was returned to the Post Anesthesia Care Unit and  extubated.  Following stabilization, she was transferred to a bed in a  surgical step-down unit.  She was observed overnight.  The following  morning, she was able to be transferred to a bed on renal floor.   Her left foot was not healing.  She continue to have rest pain.  She was  observed several days and it became clear that there was no real healing  of her left foot and a below-knee amputation was recommended.  She was  informed of the risks and benefits of the procedure and after careful  consideration, elected to proceed with surgery.   On November 06, 2008, she was taken to the operating room and underwent a  left BKA.  For complete details, please refer to the typed operative  report.  The procedure was without complication.  She was returned to  the  Post Anesthesia Care Unit and extubated.  Following stabilization,  she was transferred to a bed on renal floor.   Postoperatively, she displayed very slow progression.  We felt that she  would benefit from a short stay in a rehabilitation facility.  We asked  the rehabilitation facility at [facility] to evaluate her and they have  found her to be a suitable candidate for their practice.  She is to be  transferred to them when a bed becomes available and she is medically  stable.      Tina Arms, PA      Janetta Hora. Fields, MD  Electronically Signed    KEL/MEDQ  D:  11/14/2008  T:  11/15/2008  Job:  [id number]

## 2010-10-08 NOTE — Assessment & Plan Note (Signed)
OFFICE VISIT   [name], [name]  DOB:  June 25, 1945                                       10/25/2009  ZOXWR#:[id number]   CHIEF COMPLAINT:  Open draining mid thigh wound, status post vein  harvest for right fem to below-knee popliteal bypass with nonreversed  greater saphenous vein.   HISTORY OF PRESENT ILLNESS:  Patient is a 65 year old woman with a  history of diabetes and end-stage renal disease who had a right femoral  to below-knee popliteal bypass with nonreversed greater saphenous vein  harvested from the right leg on 09/04/2009.  The patient has been doing  well until this weekend when she noted drainage of old blood from 1 of  the harvest site wounds on the medial side of the right leg.  She was  seen in the emergency room by Dr. Arbie Cookey and started on antibiotics.  She  states that with the past few days, she has had more serous and dark,  bloody-type drainage from the wound, and it has gotten more tender and  red.  She denies any fever or chills but states that the area has become  now painful.   PHYSICAL EXAMINATION:  This is a well-developed, well-nourished woman in  no acute distress.  Her heart rate was 80, sats were 98%, respiratory  rate was 12.  The right lower extremity, the foot was warm and pink.  She had 1 area at the distal pole of 1 of the harvest site wounds, which  was draining some serous drainage.  This was probed with a Q-tip and  opened wider with suture scissors, and old blood was expressed.  A  culture was taken and sent.  The wound was packed with wet-to-dry Nu  Gauze.  All the other wounds were well-healed with no other signs of  erythema.  The patient states after opening the wound and drainage that  the area was less tender.   ASSESSMENT:  Open draining harvest site wound in the medial right thigh,  probably infected.   PLAN:  The patient will continue her p.o. antibiotics.  She will pack  the wound daily, and she will  see Dr. Darrick Penna next week for followup.  If  the patient should note increasing drainage, bright red bleeding or more  purulent drainage from the wound, she will come to the emergency room or  call the office.   Della Goo, PA-[name]   [place]. Edilia Bo, M.D.  Electronically Signed   RR/MEDQ  D:  10/25/2009  T:  10/25/2009  Job:  [id number]

## 2010-10-08 NOTE — Op Note (Signed)
[name], [name]                ACCOUNT NO.:  [id number]   MEDICAL RECORD NO.:  [id number]          PATIENT TYPE:  INP   LOCATION:  6707                         FACILITY:  [facility]   PHYSICIAN:  Janetta Hora. Fields, MD  DATE OF BIRTH:  1945-10-12   DATE OF PROCEDURE:  11/06/2008  DATE OF DISCHARGE:                               OPERATIVE REPORT   PROCEDURE:  Left below-knee amputation.   PREOPERATIVE DIAGNOSIS:  Nonhealing wound, left foot.   POSTOPERATIVE DIAGNOSIS:  Nonhealing wound, left foot.   ANESTHESIA:  General.   ASSISTANT:  Jerold Coombe, PA-C   INDICATIONS:  The patient is a 65 year old female with nonhealing wounds  and areas of gangrenous changes in her toes and her left foot.  This has  not progressed towards healing despite previous femoral-popliteal bypass  grafting.  She now presents for elective below-knee amputation.   OPERATIVE FINDINGS:  Pulse and popliteal artery, left leg.   OPERATIVE COUNTS:  After obtaining informed consent, the patient was  taken to the operating room.  The patient was placed in the supine  position on the operating table.  After induction of general anesthesia  and endotracheal intubation, the patient's entire left lower extremity  was prepped and draped in the usual sterile fashion.  Next, a tourniquet  was placed above the knee in the left leg.  An Esmarch bandage was used  to exsanguinate the leg.  Tourniquet was then inflated to 300 mmHg.  A  transverse incision was made on the anterior aspect of the left leg  approximately five fingerbreadths below the tibial tuberosity.  Incision  was carried down through the subcutaneous tissues and fascia.  A long  posterior flap was then created.  The periosteum was raised on the  tibia.  The tibia was then transected with a saw.  The periosteum on the  fibula was raised and this was transected approximately 2 cm above the  tibia with a bone cutter.  The anterior edge of the tibia was  then  rasped smooth.  The remainder of the leg was then amputated using the  amputation knife.  The leg was passed off the table as a specimen.  All  obvious vessels were then ligated with figure-of-eight sutures in the  operative field.  The tourniquet was then deflated.  There were few  small areas of oozing and these were controlled with cautery and  additional figure-of-eight sutures.  The muscle was pink and well  perfused.  The popliteal artery was pulsatile in nature.  Tibial nerve  was pulled down the operative field, ligated with 2-0 Vicryl tie, and  allowed to retract up into the leg.  Next, the wound was thoroughly  irrigated with normal saline solution.  The fascial edges were  reapproximated using interrupted 2-0 Vicryl sutures.  The subcutaneous tissues were reapproximated using running 3-0 Vicryl  suture.  Skin was closed with staples.  The patient tolerated the  procedure well and there were no complications.  Instrument, sponge, and  needle counts were correct at the end of the case.  The patient  was  taken to the recovery room in stable condition.      Janetta Hora. Fields, MD  Electronically Signed     CEF/MEDQ  D:  11/06/2008  T:  11/07/2008  Job:  [id number]

## 2010-10-08 NOTE — Op Note (Signed)
[name], NANCARROW                ACCOUNT NO.:  [id number]   MEDICAL RECORD NO.:  [id number]          PATIENT TYPE:  AMB   LOCATION:  SDS                          FACILITY:  [facility]   PHYSICIAN:  Balinda Quails, M.D.    DATE OF BIRTH:  [birth year]   DATE OF PROCEDURE:  07/25/2008  DATE OF DISCHARGE:  07/25/2008                               OPERATIVE REPORT   SURGEON:  Balinda Quails, MD   ASSISTANT:  Nurse.   ANESTHESIA:  Local with MAC.   PREOPERATIVE DIAGNOSES:  1. End-stage renal failure.  2. Clotted left forearm loop arteriovenous graft.   POSTOPERATIVE DIAGNOSES:  1. End-stage renal failure.  2. Clotted left forearm loop arteriovenous graft.   PROCEDURE:  Thrombectomy revision, left forearm loop arteriovenous  graft.   OPERATIVE PROCEDURE:  The patient was brought to the operating room in  stable condition.  Placed in the supine position.  Left arm prepped and  draped in a sterile fashion.  Skin and subcutaneous tissue instilled  with 1% Xylocaine with epinephrine.  Longitudinal skin incision made  through the left antecubital fossa.  Dissection carried down to expose  the venous limb of the graft.  The graft was thrombosed.  The venous  anastomosis exposed and the graft mobilized and encircled with a vessel  loop.  The vein revealed marked pseudointimal narrowing.  The vein was  mobilized proximally in the arm to an area free of pseudointima.  This  was the basilic vein.   The graft was then divided transversely across the venous limb.  The  vein resected up to more normal-appearing vein.  This was controlled  proximally with a Gregory clamp.   The graft was then thrombectomized with a 4-Fogarty catheter.  Several  passes made with excellent inflow obtained.  The graft filled with  heparin saline solution and controlled with a fistula clamp.  A new  segment of 6-mm Gore-Tex was then anastomosed end-to-end to divide the  graft using running 6-0 Prolene suture.  This  was then beveled and  anastomosed end-to-end to the basilic venous system using running 6-0  Prolene suture.  Clamps were then removed.  Excellent flow was present.  Adequate hemostasis was obtained.  Sponge and instrument counts was  correct.   Subcutaneous tissue closed with running 3-0 Vicryl suture in 2 layers.  Skin closed with 4-0 Monocryl.  Dermabond applied.   The patient tolerated the procedure well.  No apparent complications.  Transferred to recovery room in stable condition.      Balinda Quails, M.D.  Electronically Signed     PGH/MEDQ  D:  07/25/2008  T:  07/26/2008  Job:  [id number]

## 2010-10-08 NOTE — Assessment & Plan Note (Signed)
OFFICE VISIT   [name], [name]  DOB:  [birth year]                                       02/23/2008  OVFIE#:[id number]   The patient is a 65 year old female referred by Dr. Leeanne Deed for  evaluation of a left foot ulcer.  She has a history of hypertension,  diabetes and renal insufficiency.  She is approaching need for  hemodialysis and had an AV graft placed on September 24.  She states  that her left third toe developed an ulcer several weeks ago and has  been healing poorly since then.  She does not know how the ulceration  started.  She does have numbness and tingling in both feet suggestive of  neuropathy.  She denies rest pain.  She denies claudication.   PHYSICAL EXAMINATION:  Vital signs:  On physical exam blood pressure is  205/100 in the left arm, pulse is 84 and regular.  HEENT:  Unremarkable.  Neck:  Has 2+ carotid pulses without bruit.  Chest:  Clear to  auscultation.  Cardiac:  Exam is regular rate and rhythm.  Abdomen:  Is  obese, soft, nontender, nondistended.  Vascular:  She has 2+ femoral  pulses bilaterally.  She has absent popliteal and pedal pulses  bilaterally.  She has a left arm AV graft in the forearm.  This has an  audible bruit and palpable thrill.  She has no numbness or tingling in  the left hand suggestive of steal.   She had bilateral ABIs performed on September 10.  This showed an ABI on  the right side of 0.54 and on the left side of 0.55.  Toe pressure was  57 bilaterally.   On examination of the toe ulcer there is a large ulcerated area on the  plantar aspect of the left third toe.  There is also some ulceration  dorsally.  The toe is also dark in color all the way to the base.   I discussed with the patient today that I am not optimistic that this  wound is going to heal without improvement in circulation to her foot.  I also do not believe that she would heal an isolated left toe  amputation spontaneously.  I believe the  best option for her would be a  left lower extremity arteriogram.  Unfortunately, this is going to be  complicated by the fact that she is nearing need for hemodialysis.  I  informed her that a contrast angiogram would possibly put her over the  edge and into long-term hemodialysis.  She stated that her kidney doctor  told her that she would probably need dialysis within the next couple  months.  She was somewhat reluctant to consider starting dialysis sooner  but she understands that without increased perfusion to her left foot  that she may be at risk of limb loss without revascularization.  In  light of this she has decided to undergo the risk of contrast  nephropathy and undergo an arteriogram to see if she is a  revascularization candidate in the left leg.  I did emphasize to her  today that she may be unreconstructable.  Arteriogram is scheduled for  03/03/2008.   Janetta Hora. Fields, MD  Electronically Signed   CEF/MEDQ  D:  02/23/2008  T:  02/24/2008  Job:  [phone]

## 2010-10-08 NOTE — Assessment & Plan Note (Signed)
OFFICE VISIT   [name], [name]  DOB:  1945-12-20                                       08/22/2009  CHART#:[id number]   A 65 year old female who previously underwent in June of 2010.  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  In August of 2002 she underwent right lower  extremity superficial femoral artery stenting with a Viabahn graft.  She  was last seen in November of 2010 and was having no problems at that  time.  However, approximately 2 weeks ago while working with her new  prosthetic leg she began noticing that the right foot was having  increasing pain.  She also has had increase in the numbness and tingling  from her baseline neuropathy.  She stated that this has gradually been  getting worse over the last 2 weeks.  She has no open ulcerations or  wounds on the right foot.  Pain is continuous in nature and not really  improved with elevating or lowering the foot.   CHRONIC MEDICAL PROBLEMS:  Include end-stage renal disease with Monday,  Wednesday, Friday dialysis at [facility].  She also has diabetes and  coronary artery disease with previous myocardial infarction in 2006.  All these medical problems are currently under control.   PAST SURGICAL HISTORY:  Also remarkable for hysterectomy and the above  mentioned procedures.   FAMILY HISTORY:  Unremarkable.   SOCIAL HISTORY:  She is single.  She is retired.  She is a former smoker  but quit 20 years ago.  She does not consume alcohol regularly.   REVIEW OF SYSTEMS:  Full 12 point review of systems was performed with  the patient today.  Please see intake referral form for details  regarding that.   PHYSICAL EXAM:  Vital signs:  Blood pressure is 172/87 in the right arm,  heart rate is 82 and regular.  Temperature is 97.9.  HEENT:  Unremarkable.  Neck:  Has 2+ carotid pulses without bruit.  Chest:  Clear to auscultation.  Cardiac:   Exam is regular rate and rhythm  without murmur.  Abdomen:  Is slightly obese, soft, nontender,  nondistended.  No masses.  Extremities:  She has 2+ femoral pulses  bilaterally.  She has a well-healed left below knee amputation.  She has  absent popliteal and pedal pulses in the right leg.  There is trace  edema in the right lower extremity.  She is cool from the knee down to  the foot and the right leg.  There are no open ulcerations on the foot.  There is dependent rubor and elevation pallor in the right leg.  Neurological:  Exam shows slightly decreased sensation to light touch in  the right foot.  Musculoskeletal:  Exam shows no obvious major joint  deformities.  Skin:  Has no open ulcers or rashes.   She had a right lower extremity duplex today which showed inaudible flow  in the posterior tibial artery.  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  She had calcified vessels but did have flow in the right  anterior tibial artery. It appears by duplex exam that the superficial  femoral artery stent is now occluded.   CURRENT MEDICATIONS:  Include Plavix, hydrocodone, Synthroid, Lantus  insulin, aspirin and Tums.  In summary, the patient has had increasing pain in her right leg over  the last few weeks.  It now appears that she has an occlusion of her  right superficial femoral artery stent with progressive worsening  ischemia of her right foot.  I do not believe she is probably going to  be a very good candidate for lysis due to the chronicity of the  occlusion.  This may have been intimal hyperplasia and progressive over  time.  However, it may just represent a high-grade stenosis and  potentially would be salvageable.  I believe the best option for her  would be a right lower extremity arteriogram, possible angioplasty and  stenting.  If the stent is occluded and cannot be salvaged we will need  to consider a right lower extremity bypass.  We will stop her Plavix  today and keep  her on aspirin alone in anticipation that she may require  a bypass.  We will schedule her arteriogram for Friday 09/03/2009.  We  will schedule her dialysis around this since it will be on a nondialysis  day we will talk with [facility] about whether they can dialyze her  on Thursday prior to this.  Of note, she does have a prior history of  shellfish allergy, however, she has had multiple prior angiograms  without premedication and no difficulty.  The risks, benefits, possible  complications and procedure details of bypass procedure including but  not limited to bleeding,  infection, vessel injury or contrast reaction.     Janetta Hora. Fields, MD  Electronically Signed   CEF/MEDQ  D:  08/22/2009  T:  08/23/2009  Job:  [id number]   cc:   Juline Patch, M.D.   Kidney  [facility]  Llana Aliment. Deterding, M.D.  Ranelle Oyster, M.D.

## 2010-10-08 NOTE — Assessment & Plan Note (Signed)
OFFICE VISIT   [name], [name]  DOB:  [birth year]                                       04/04/2010  ZOXWR#:[id number]   The patient is a 65 year old female referred by Dr. Arrie Aran for left  hand pain in the face of a left arm AV graft.  The left forearm graft  was placed in  September 2009.  She has had multiple thrombectomies and  revisions.  The patient states that most recently she had a shuntogram  with radiologic thrombolysis approximately 1 month ago.  She states that  since that time, her hand has been cool and painful.  She states that  the pain becomes much worse on dialysis and she has actually had to stop  her treatment several times recently.  She had no problems with the  graft or with her hand prior to this.   REVIEW OF SYSTEMS:  She denies any chest pain or shortness of breath.   PHYSICAL EXAMINATION:  Blood pressure is 155/84 in the right arm, heart  rate is 81 regular.  Temperature is 98.  In the left upper extremity,  she has a palpable graft pulse in the left forearm.  The left hand is  cool compared to the right.  She does have brisk capillary refill.   I reviewed her most recent shuntogram which shows a patent graft.  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  This could, however, be due to preferential shunting of blood at the  graft.  However, her symptoms certainly are suggestive of steal and this  is either from potential atheroembolic from her procedure which  certainly time wise would be a reasonable explanation or she could just  had progression of diabetic arthropathy in her arterial circulation of  her left hand.  In either case, I believe the best option at this point  would be ligation of her left forearm AV graft.  We will also explore  her left brachial artery and do an arteriogram to make sure that she  does not have any  retained clot in her arterial circulation down towards  the hand at the same time.  We will also place a Diatek catheter for  temporary dialysis use.  We will plan on placing a right arm AV graft  within the next week after we do the left side.  Risks, benefits,  possible complications and procedure details were explained to the  patient today.  Will plan her procedure for April 09, 2010.     Janetta Hora. Fields, MD  Electronically Signed   CEF/MEDQ  D:  04/04/2010  T:  04/05/2010  Job:  [id number]   cc:   Terrial Rhodes, M.D.

## 2010-10-08 NOTE — Assessment & Plan Note (Signed)
OFFICE VISIT   SCIOLI, Chaundra C  DOB:  [birth year]                                       05/23/2010  EAVWU#:[id number]   DATE OF SURGERY:  05/07/2010.   The patient presents for follow-up of a right basilic vein transposition  fistula completed on 05/07/2010 by Dr. Darrick Penna.  Patient has done well  postoperatively.  She has noted some numbness in the second and third  digits of the right hand, which resolves with increased movement of the  hand.  She has also notice some coolness in the left hand and increased  fatigueability in the right hand, which resolves with minimal rest.  She  denies frank numbness or loss of motor skills in the right hand.   PHYSICAL EXAMINATION:  She has good cap refill in the right hand and a  2+ radial pulse in the right hand as well.  The incisions are healing  well.  However, she did have a reaction to adhesive tape, creating some  erythema surrounding the incision, which is improving, per the patient's  report.  There is a strong thrill in the fistula going almost completely  up the basilic vein.  The patient's heart rate 73, blood pressure is 143/73, and temperature  is 98.2.   ASSESSMENT:  Status post right basilic vein transposition fistula.   PLAN:  We will have the patient return for a follow-up appointment with  Dr. Darrick Penna in 4-5 weeks secondary to the intermittent numbness and  fatigue of the right hand and to reevaluate the fistula.  The patient  knows to call if she has increasing symptoms of numbness, tingling, loss  of motor or sensation in the right hand, and she will be seen quickly.   Pecola Leisure, PA   Charles E. Fields, MD  Electronically Signed   AY/MEDQ  D:  05/23/2010  T:  05/23/2010  Job:  [id number]

## 2010-10-08 NOTE — Assessment & Plan Note (Signed)
OFFICE VISIT   [name], [name]  DOB:  [birth year]                                       01/17/2009  AVWUJ#:[id number]   The patient returns for followup today.  She underwent stenting of her  right superficial femoral artery with a Viabahn graft.  She also  presents for further followup of her recent left below knee amputation.   PHYSICAL EXAM:  Today blood pressure is 173/95 in the right arm, pulse  is 82 and regular.  Left below knee amputation has some fibrinous  exudate on the medial aspect.  All staples were removed today.  The  fibrinous exudate was debrided sharply.  She did have some bleeding  tissue under this and this appears that it should heal up over time.  Her right leg is warm, her right foot is warm and well-perfused today.  It is pink in color.  She has 2+ femoral pulses bilaterally.  Left groin  incision is well-healed.  She has no evidence of pseudoaneurysm in the  left groin.   She had a right leg TBI performed today which has gone from 0.36 to 0.76  since her superficial femoral artery stenting.   Overall the patient is slowly continuing to heal her below knee  amputation on the left side.  Her right leg perfusion is now much  improved.  However, she states she still does have some pain and  swelling in the right foot on occasion.  I will review her again in two  weeks' time to recheck her left BKA.  Will place her in graft  surveillance protocol for her Viabahn graft on the right side.   Janetta Hora. Fields, MD  Electronically Signed   CEF/MEDQ  D:  01/17/2009  T:  01/18/2009  Job:  [id number]

## 2010-10-08 NOTE — Discharge Summary (Signed)
[name], [name]                ACCOUNT NO.:  [id number]   MEDICAL RECORD NO.:  [id number]          PATIENT TYPE:  IPS   LOCATION:  4030                         FACILITY:  [facility]   PHYSICIAN:  Ranelle Oyster, M.D.DATE OF BIRTH:  07-04-1945   DATE OF ADMISSION:  11/16/2008  DATE OF DISCHARGE:  11/24/2008                               DISCHARGE SUMMARY   DISCHARGE DIAGNOSES:  1. Peripheral vascular disease requiring left below-knee amputation.  2. Diabetes mellitus type 2.  3. Coronary artery disease.  4. Anemia of chronic disease.  5. Neuropathy, improving.   HISTORY OF PRESENT ILLNESS:  [name] is a 65 year old female with  hypertension, diabetes mellitus, and renal disease admitted to the  office by Dr. Darrick Penna on Oct 21, 2008, for ischemic left foot.  The  patient underwent left femoral to below knee bypass graft and left foot  toe amputation on October 26, 2008, with attempts at limb salvage.  As the  patient continued to have issues with poor wound healing with continued  ischemia, she required left BKA on November 06, 2008.  Postprocedure, the  patient had acute blood loss anemia, but no dysesthesia __________.  She  has had some drainage from her left groin wound with some minimal  dehiscence and dressing changes have been done on a more frequent basis.  The patient was evaluated by rehab and found to be a good CIL candidate.  The patient is admitted to Rehab on November 16, 2008, for comprehensive  inpatient rehab program.   PAST MEDICAL HISTORY:  Significant for:  1. Coronary artery disease with PTCA x4.  2. Hypertension.  3. Dyslipidemia.  4. Hypothyroidism.  5. GERD.  6. Low back pain.  7. Fibromyalgia.  8. DM type 2.  9. End-stage renal disease due to diabetes with hemodialysis initiated      3 months ago.  10.Neuropathy.  11.Hysterectomy.  12.Cholecystectomy.   ALLERGIES:  SHRIMP, AUGMENTIN, RIFAMPIN, and EXCEDRIN.   FAMILY HISTORY:  Positive for diabetes  mellitus.   SOCIAL HISTORY:  The patient lives with mother in one-level home with 2  steps to entry.  She been disabled since 2006.  Quit tobacco years ago.  Does not use any alcohol.   FUNCTIONAL HISTORY:  The patient was independent prior to admission.   FUNCTIONAL STATUS:  The patient is min assist for transfers and min  assist ambulating, transfers with a rolling walker, requires rest breaks  with __________ each step.  She is at __________.   PHYSICAL EXAMINATION:  VITAL SIGNS:  Blood pressure 101/62, pulse 65,  temperature 97.8, respirations 20.  GENERAL:  The patient is pale-appearing female, alert, and not in  distress.  HEENT:  Eyes, anicteric and noninjected.  Extraocular movement intact.  Hearing intact.  Nares patent.  Oral mucosa is moist.  She does have  some dental disease with some loosening of right front teeth.  NECK:  Supple without JVD or lymphadenopathy.  LUNGS:  Clear to auscultation bilaterally without wheezes, rales, or  rhonchi.  HEART:  Regular rate and rhythm without murmurs or gallops.  EXTREMITIES:  She has 1+ pedal edema on right lower extremity.  Left  groin incision shows every other staples out with some deep yellow  eschar on proximal end of incision.  The left lower extremity incisions  show every other staples to have been removed along medial thigh.  Staples, across BKA site intact.  She has some ecchymosis along the  incision line.  NEUROLOGIC:  Alert and oriented x3.  Gait not tested.  Her range of  motion is reduced.  She has reduced activity, left hip flexor, knee  flexor, and knee extensor.  Motor strength is 5/5 bilateral upper  extremity, right lower extremity is 4/5, left lower extremity could not  be tested anything but hip flexors, and is poor.  Memory, mood, affect,  orientation intact.  Sensation decreased below ankle on the right lower.   HOSPITAL COURSE:  [name] was admitted to rehab on November 16, 2008, for inpatient  therapies to consist of PT, OT at least 3 hours, 5  days a week.  Past admission physiatrist, rehab RN, and therapy team  have worked together to provide customized collaborative  interdisciplinary care.  The patient's hemodialysis has been ongoing on  Monday, Wednesday, Friday scheduled during the stay.  Her acute blood  loss anemia has been monitored with routine check with Aranesp and  Venofer being used to help supplement this.  CBC of __________ prior to  discharge revealed hemoglobin 11.7, hematocrit 36.8, white count 6.2,  platelets 264.  Check of lytes prior to discharge revealed sodium 134,  potassium 3.7, chloride 94, CO2 of 28, BUN 20, creatinine to 6.88,  glucose 118, calcium 9.0, phosphorous 3.6, albumin 2.9.   Dietary consult was ordered to help with the patient's p.o. intake to  provide the patient with food choices that were appropriate for her.  Nutritional supplements were added on t.i.d. basis to help the patient  with a nutritional status.  The patient was initially noted to have some  high levels of anxiety and concerns, which have improved by the time of  discharge.  The patient has also had issues with pain control.  She  reported to increasing neuropathy, especially in her feet at the time of  admission.  She has had issues with Ultram, which was initially tried  for pain management.  Ultram was discontinued on November 18, 2008, and the  patient was started on OxyContin 10 mg q.12 h. with OxyIR on p.r.n.  basis.  This has been effective in the patient's pain management.   During the patient's stay in rehab, weekly team conference was held to  monitor the patient's progress, set goals, as well as discuss barriers  to discharge.  Rehab RN has been assisting by monitoring the patient's  wounds carefully as well as on pressure relief measures.  PRAFO was  ordered for right foot as the patient was noted to have erythema and  redness with demarcation of distal right foot.   Also, an Chiropractor was ordered for pressure relief measures.  At the time of  discharge, the patient's right foot, heel, and forefoot showed no  evidence of cyanosis.  Staples on the patient's left groin as well as  left medial thigh were discontinued.  Thigh incisions clean and dry,  healing well.  Left groin wound continues to have yellow eschar  approximately 1 cm in length.  The patient was initially on wet-to-dry  dressing changes.  As this has not been very effective  in degrading the  yellowish scar, Santyl was added.  The patient continued to have Santyl,  damp-to-dry dressing changes to be done on a daily basis.  Home health  nurse to follow up on wound care monitoring.  The patient's left BKA  site has healed well without any signs or symptoms of infection.  Incision is clean and dry.  Currently, the patient continues with dry  dressing on her stump.   At the time of admission to rehab, the patient was noted to be limited  by neuropathy with phantom pain in the left foot.  She had significant  deficits in lower extremity sensation due to neuropathy as well as right  lower extremity weakness.  Ambulation was not attempted due to the  patient's inability to know right foot placement when she was hopping.  The patient was overall min assist for sit to stand transfers.  She is  able to maintain static standing 1.5 minute with min assist, limited due  to the pain in left stump.  The PT has been working with the patient  with sit to stand transitional changes.  They have also been working on  lower extremity exercise group with hamstring, goal for hip flexor and  stretching of left residual limb.  Lower extremity exercise group has  been ongoing to increase functional joint movement, improvement of left  lower extremity strengthening.  Ortho upper extremity group has been  ongoing to improve upper extremity strengthening to help with the  patient's safety, improve  functional joint movement, as well as improve  upper extremity strength to help with boosting.  During the patient's  stay in rehab, the patient has progressed along well.  She is currently  at modified independent level at wheelchair level.  She did not want to  attempt any ambulation hopping due to fear of injury to her right foot  and neuropathy.  OT has been working with the patient on self-care.  At  the time of admission, she was noted to have decrease in activity  tolerance with pain to residual limb with significant weakness.  She was  min assist overall for basic ADLs and transfers.  OT has been working  with the patient on desensitization techniques of left residual limb.  They have also been working with the patient on wrapping and unwrapping  the stump.  The patient has made excellent progress during her stay  achieving modified independent level for ADLs including simple meal  preparation task at wheelchair level.  The patient is showing increased  activity tolerance and safe techniques for mobility and all ADL tasks.  The patient to continue with further followup home health PT, OT by  [facility].  Home Health RN has also been arranged for monitoring  of left groin wound.  On November 24, 2008, the patient is discharged to  home.   DISCHARGE MEDICATIONS:  1. Zocor 40 mg at bedtime.  2. Nephro-Vite 1 per day.  3. Synthroid 112 mcg a day.  4. PhosLo 667 mg 1 p.o. t.i.d. before meals.  5. Plavix 75 mg a day.  6. Protonix 40 mg a day.  7. OxyContin CR 10 mg 1 p.o. q.12 h. x7 days then decrease to 1 per      day until gone, #21 RX.  8. Senokot-S 2 p.o. at bedtime.  9. Lantus insulin 10 units at bedtime.  10.Oxycodone IR 5 mg 1-2 q.6 h. p.r.n. pain #60 RX.  11.Xanax b.i.d. basis p.r.n.  12.Hectorol 2 mcg IV Monday, Wednesday, and Friday with hemodialysis.  13.Aranesp 200 mcg IV on Mondays with hemodialysis.   DIET:  __________ diabetic diet.   WOUND CARE:  Santyl,  damp-to-dry dressing left groin.  Dry dressing,  left stump.   SPECIAL INSTRUCTIONS:  [facility] to provide PT, OT, and RN.  Do  not use Micardis, Norvasc, Darvocet, or hydrocodone.   FOLLOWUP:  The patient to follow up with Dr. Riley Kill on January 16, 2009,  at 9:30 for 9:45 appointment.  Follow up with Dr. Ricki Miller for routine  check.  Follow up with Dr. Darrick Penna for routine check.  Follow up with Dr.  Darrick Penna for routine check.      Greg Cutter, P.A.      Ranelle Oyster, M.D.  Electronically Signed    PP/MEDQ  D:  11/24/2008  T:  11/25/2008  Job:  [id number]   cc:   Dr. Darrick Penna  Dr. Toy Cookey, M.D.

## 2010-10-08 NOTE — Assessment & Plan Note (Signed)
OFFICE VISIT   [name], [name]  DOB:  Oct 22, 1945                                       01/31/2009  EAVWU#:[id number]   The patient returns for followup today.  She previously had right SFA  stenting with a Viabahn graft.  Recently we have been following her for  a poorly healing area of her left below knee amputation.  Her left below  knee amputation was done in June of 2010.   On exam today there is a 2 x 1 cm area that has some granulation tissue  with some superficial fibrinous exudate. This was all debrided.  There  was also a small sinus tract laterally.  All of this was debrided as  well and the wound packed with Nugauze.  Overall the wound is continuing  to heal and hopefully within the next few weeks will have completely  resolved.  She will follow up with me in 1 month's time.  She will  continue to do wet-to-dry dressing changes in the meanwhile.  The right  foot is pink, warm and well-perfused on exam today.  She will continue  our graft surveillance protocol for evaluation of her right leg.   Janetta Hora. Fields, MD  Electronically Signed   CEF/MEDQ  D:  01/31/2009  T:  02/01/2009  Job:  [id number]

## 2010-10-08 NOTE — Assessment & Plan Note (Signed)
OFFICE VISIT   SAMORA, JERNBERG C  DOB:  Jun 02, 1945                                       11/07/2009  ZOXWR#:[id number]   The patient returns for followup today.  She had a right femoral to  below-knee popliteal bypass with popliteal endarterectomy using right  greater saphenous vein on April 12.  She had an incision and drainage of  an abscess on her saphenectomy incision last week.  She returns today  for further followup.  She denies any fever or chills.  She has had no  significant drainage.  She is doing wet-to-dry dressing changes of her  right thigh.   On exam today there is good granulation tissue at the base.  The wound  is healing well at this point.  Culture grew out Streptococcus viridans.  I do not believe she needs further antibiotics as the wound is fairly  clean at this time and should continue to heal on its own.  Her  temperature today was 98.4.  She will follow up in 1 month's time or  sooner if necessary.     Janetta Hora. Fields, MD  Electronically Signed   CEF/MEDQ  D:  11/07/2009  T:  11/08/2009  Job:  [id number]

## 2010-10-08 NOTE — Assessment & Plan Note (Signed)
OFFICE VISIT   HARMANI, NETO C  DOB:  September 11, 1945                                       10/31/2009  ZOXWR#:[id number]   The patient returns for follow-up today.  She underwent a right femoral  to below knee popliteal bypass with nonreversed right greater saphenous  vein and popliteal endarterectomy on September 04, 2009.  She subsequently  has developed some drainage from her mid thigh wound from a previous  saphenectomy.  She was last seen in the office on June 2.  At that time  the area was probed with a Q-tip and opened slightly wider.  There was  culture taken but this did not grow out any bacteria.  She is placed on  p.o. antibiotics at that time.  She returns for further follow-up today.  She denies any fever or chills.  She currently is dialyzing on Monday,  Wednesday and Friday without difficulty.  She states that her right leg  is still very tender and sore around the area that has been draining.  She has been packing this, but the tract is fairly small in character  and has essentially healed over at this point.   PHYSICAL EXAM:  Today there is a palpable mass in medial aspect of her  thigh approximately 5 x 5 cm.  There is some erythema around the  incision.  Right foot is pink, warm, well-perfused.   I believe the best option at this point would be I and D of her right  medial thigh with drainage of this.  If there is infection present we  will leave this open to granulate in by secondary intention.  If this is  only hematoma we will wash this out thoroughly and then consider primary  closure.  I discussed this with the patient today.  She will proceed  with operation tomorrow.     Janetta Hora. Fields, MD  Electronically Signed   CEF/MEDQ  D:  10/31/2009  T:  11/01/2009  Job:  [id number]

## 2010-10-08 NOTE — H&P (Signed)
[name], [name]                ACCOUNT NO.:  [id number]   MEDICAL RECORD NO.:  [id number]          PATIENT TYPE:  IPS   LOCATION:  4030                         FACILITY:  [facility]   PHYSICIAN:  Erick Colace, M.D.DATE OF BIRTH:  January 16, 1946   DATE OF ADMISSION:  11/16/2008  DATE OF DISCHARGE:                              HISTORY & PHYSICAL   REASON FOR ADMISSION:  Rehabilitation after a right below-knee  amputation in a patient with end-stage renal disease.   CHIEF COMPLAINT:  Weakness.   HISTORY OF PRESENT ILLNESS:  A 65 year old female with prior history of  hypertension, diabetes, end-stage renal disease admitted to the hospital  after being seen in the office by Dr. Darrick Penna on Oct 21, 2008 with  ischemia of left foot.  She underwent left revascularization procedure  and a left fourth toe amputation on October 26, 2008.  Unfortunately, the  patient had poor wound healing and required left BKA performed on November 06, 2008 per Dr. Darrick Penna.  The patient had acute blood loss anemia, but  did not have a transfusion secondary to being a [employer].  She  has had some drainage from her left groin wound with some dehiscence and  dressing changes have been increased.  She has had every other staple  removed from her medial incisions in the left thigh.  The patient was  seen by Physical Medicine Rehabilitation after being evaluated by PT and  OT as well, found to be a good rehabilitation candidate, and therefore  was admitted.  Please see inpatient health and history form for review  of systems.  Pertinent positives include weakness, numbness, insomnia,  anxiety, and wound drainage.   PAST MEDICAL HISTORY:  1. Fibromyalgia.  2. Type 2 diabetes.  3. Neuropathy secondary to diabetes.  4. Low back pain.  5. GERD.  6. Hypothyroidism.  7. Hyperlipidemia.  8. Hypertension.  9. Coronary artery disease status post stenting in 2007.   PAST SURGICAL HISTORY:  Hysterectomy,  cholecystectomy as well as above.   SOCIAL HISTORY:  Lives with her mother.  One-level home and no steps to  enter.  Disabled since 2006.  Tobacco of 10 years.  EtOH negative.   FUNCTIONAL HISTORY:  Independent with a cane prior to admission.   HOME MEDICATIONS:  1. Zocor 40 mg p.o. daily.  2. Colace 100 mg p.o. daily.  3. Norvasc 10 mg p.o. daily.  4. Calcium acetate 667 mg t.i.d.  5. Lantus insulin 50 units subcu at bedtime.  6. Levothyroxine 112 mcg p.o. daily.  7. Micardis 80 mg p.o. daily.  8. Lasix 80 mg two p.o. b.i.d.  9. Phenergan 25 mg p.o. t.i.d. p.r.n.  10.Plavix 75 mg p.o. daily p.r.n.  11.Darvocet p.r.n.  12.Xanax 0.25 mg p.o. b.i.d.  13.Hydrocodone 5/500.   LABORATORY DATA:  Creatinine was 7.33, BUN 39, sodium 136, potassium  3.9, and phosphorus 4.5.  Her hemoglobin was 9.5, white count 5.2, and  platelets 236,000.   PHYSICAL EXAMINATION:  VITAL SIGNS:  Blood pressure 101/62, pulse 65,  temperature 98.3, and respirations 20.  GENERAL:  Chronically  ill-appearing female in no acute distress.  Mood  and affect appropriate.  HEENT:  Eyes anicteric, noninjected.  External ENT normal.  She does  have some gum disease and some loosening of the right front tooth.  NECK:  Supple without adenopathy.  RESPIRATORY:  Effort is good.  Lungs are clear.  HEART:  Regular rate and rhythm.  EXTREMITIES:  She has 1+ pedal edema on the right lower extremity.  In  the left lower extremity, she has staples in place with every other  staple removed along the medial thigh incision.  She also has staples  across the BKA stump.  She has some ecchymosis along the incision line.  SKIN:  There is no drainage.  Her right heel has some redness and mild  lateral tenderness.  She has some skin color changes to the right third  toe.  NEUROLOGIC:  Her gait was not tested by myself.  Her range of motion is  reduced actively in the left hip flexor, knee extensor, and knee flexor.  Her motor  strength is 5/5 bilateral upper extremities and the right  lower extremities 4/5.  Left lower extremity could not test anything  other than hip flexor which was poor.  Her mood, memory, affect, and  orientation are all intact.  Sensation reduced below the ankle in the  right lower extremity to light touch.   POST ADMISSION PHYSICIAN EVALUATION:  1. Functional deficits secondary to left below-knee amputation due to      peripheral vascular disease.  2. The patient admitted to receive collaborative interdisciplinary      care between the podiatrist, rehab nursing staff, and therapy team.  3. The patient's level of medical complexity and substantial therapy      needs in context of that medical necessity cannot be provided at a      lesser intensity of care.  4. The patient has experienced potential functional loss from her      baseline.  Upon functional assessment at time of preadmission      screening this was not completed.  Upon functional evaluation      today, the patient is at a min assist transfers, min assist      ambulation 5 feet with rolling walker requiring rest at each step,      and can exhibit transfers from bed to wheelchair.  Judging by the      patient's physical exam, diagnosis, and functional history, the      patient has potential to make functional progress which will result      in measurable gains while in inpatient rehab.  These gains will be      of substantial and practical use upon discharge to home in      facilitating mobility, self-care, and independence.  Interim      changes in medical status since readmission screening are detailed      in the history of present illness.  5. Physiatrist will provide 24-hour management of medical needs as      well as oversight of therapy plan/treatment to provide guidance as      appropriate regarding the interaction of the two.  6. A 24-hour rehab nursing will assist in the management of skin,      bowel, and bladder  issues as well as wound healing and help      integrate therapy concepts, techniques, and education.  7. PT will assess and treat for lower extremity strengthening, edema  control, pre-gait training, gait training, transfers and equipment.      Goals are for a modified independent wheelchair level with      supervision.  Goals are limited household ambulation using a      walker.  8. OT will assess and treat for upper extremity strengthening, range      of motion, ADLs, equipment, and splinting.  Goals are for      supervision to min assist ADLs.  In the lower extremity she should      modified and independent in the upper extremity for bathing and      dressing.  9. Case management and social work will assess and treat for      psychosocial issues and discharge planning.  10.Team conference will be held weekly to assess the patient      progress/goals and to determine barriers to discharge.  11.The patient has demonstrated sufficient medical stability and      exercise capacity to tolerate at least 3 hours therapy per day at      least 5 days per week.   HOSPITAL LENGTH OF STAY:  Two weeks.   PROGNOSIS FOR FUNCTIONAL IMPROVEMENT:  Good.   MEDICAL PROBLEM LIST AND PLAN:  1. Right BKA wound care, staple removal as per Vascular Surgery.  2. Type 2 diabetes.  Continue Lantus 40 units subcu at bedtime with      sliding scale insulin for elevated blood sugars.  3. Coronary artery disease status post PTCA, on Plavix.  Monitor for      exercise tolerance.  4. Anemia of chronic disease.  We will monitor.  5. DVT prophylaxis, subcu heparin.  6. Anxiety, p.r.n. Xanax.      Erick Colace, M.D.  Electronically Signed     AEK/MEDQ  D:  11/16/2008  T:  11/17/2008  Job:  [id number]   cc:   Fayrene Fearing L. Deterding, M.D.  Janetta Hora. Darrick Penna, MD  Antionette Char, MD

## 2010-10-08 NOTE — Op Note (Signed)
[name], [name]                ACCOUNT NO.:  [id number]   MEDICAL RECORD NO.:  [id number]          PATIENT TYPE:  INP   LOCATION:  3311                         FACILITY:  [facility]   PHYSICIAN:  Janetta Hora. Fields, MD  DATE OF BIRTH:  09/25/1945   DATE OF PROCEDURE:  10/26/2008  DATE OF DISCHARGE:                               OPERATIVE REPORT   PROCEDURES:  1. Left femoral-to-below-knee popliteal bypass with nonreversed      greater saphenous vein.  2. Amputation of left fourth toe.  3. Intraoperative arteriogram x1.   PREOPERATIVE DIAGNOSIS:  Ischemia of left foot with gangrene.   POSTOPERATIVE DIAGNOSIS:  Ischemia of left foot with gangrene.   ANESTHESIA:  General.   SURGEON:  Charles E. Fields, MD   ASSISTANTS:  1. Emerson Monte, RNFA  2. Jess Barters, RNFA   OPERATIVE FINDINGS:  1. Good quality left greater saphenous vein approximately 4 mm in      diameter.  2. Intraoperative arteriogram with 3-vessel runoff proximally.  3. Poorly perfused tissues, left foot.   SPECIMENS:  Left fourth toe with metatarsal head.   OPERATIVE DETAILS:  After obtaining informed consent, the patient was  taken to the operating room.  The patient was placed in the supine  position on the operating table.  After induction of general anesthesia  and endotracheal intubation, Foley catheter was placed.  Next, the  patient's entire left lower extremity was prepped and draped in the  usual sterile fashion.  Longitudinal incision was made in the patient's  left groin, carried down through the subcutaneous tissues down to the  level of left common femoral artery.  Common femoral artery was  dissected free circumferentially.  The profunda femoris and superficial  femoral arteries were also dissected free circumferentially.  Of note,  there was a preexisting StarClose in the patient's left superficial  femoral artery anteriorly.   Next, the greater saphenous vein was harvested in the medial  portion of  the incision.  This was taken all the way down to the level of  saphenofemoral junction.  The vein was harvested through separate skip  incisions down the left leg.  Multiple side branches ligated and divided  between silk ties and clips.  Vein was harvested all the way to the  midportion of the left lower leg.  A longitudinal incision was made on  the medial aspect of the left leg and carried down through the  subcutaneous tissues and the fascia was opened, and below-knee popliteal  space was entered.  Dissection was carried down to the level of  popliteal artery.  This was moderately calcified but did have several  soft segments.  This was dissected free circumferentially all the way  down to the level of insertion of the soleus fibers.  Vessel loop was  placed around this.  Next, a tunnel was created in the subsartorial  space and between the heads of the gastrocnemius muscle.  The patient  was then given 7000 units of intravenous heparin.  The patient was also  given an additional 3000 units of heparin during the  case.  Saphenous  vein was disconnected distally and ligated with 2-0 silk tie and clips.  The vein was then oversewn with a running 5-0 Prolene suture at the  saphenofemoral junction and removed.  Vein was gently distended with  heparinized saline and checked to make sure that it was hemostatic.  Next, the left common femoral artery was controlled proximally with a  Cooley clamp and the profunda femoris and superficial femoral arteries  controlled with vessel loops.  A longitudinal opening was made in the  left common femoral artery just above the femoral bifurcation.  Vein was  placed in a nonreversed configuration.  The vein was opened  longitudinally in its hood.  Valves were lysed in this segment.  The  vein was then sewn end of vein-to-side of common femoral artery using  running 5-0 Prolene suture.  Just prior to completion of anastomosis,  this was forward  bled, backbled, and thoroughly flushed.  Anastomosis  was secured, clamps were released.  There was some leak on the medial  aspect of the anastomosis.  This was repaired with two 6-0 Prolene  sutures.  Next, the valves in the vein graft were lysed and there was  good pulsatile flow in the distal end of the vein.  Vein was then marked  for orientation and brought through the subsartorial tunnel.  Two Henley  clamps were used to control the artery proximally and distally.  Longitudinal opening was made in the below-knee popliteal artery.  The  artery was quite thickened but patent.  There was some forward and  backbleeding.  The artery was cut to length with the leg straightened  and spatulated.  The vein was then sewn end of vein-to-side of artery  using a running 6-0 Prolene suture.  Just prior to completion of  anastomosis, the vein graft was forward bled, but there was minimal flow  coming through this.  A #4 Fogarty catheter was passed through the vein  graft 2 times and still there was only trickle flow coming through the  vein graft.  Proximal portion of the vein graft was inspected.  There  was good pulsatile flow in the proximal 5 cm of the vein; however, after  that the pulse became of lower quality.  It appeared that there was  probably one retained valve; therefore, the vein graft was clamped just  distal to the proximal anastomosis and approximately 7 cm distal to  this.  The vein was then opened longitudinally over the area of the  retained valve.  There were 2 retained valves in this area and they were  both lysed and excised with Potts scissors under direct vision.  The  vein graft was then repaired with a running 7-0 Prolene suture.  Flow  was then restored to the vein graft, and there was good pulsatile flow  through the graft at this time.  The distal anastomosis was also secured  at this time.  Doppler was used to inspect the distal anastomosis and  there was good flow  distally into the popliteal artery.  The patient  also had a posterior tibial Doppler signal at the ankle.  Next,  hemostasis was obtained in all incisions.  The below-knee and groin  incisions were closed with multiple layers of running 2-0 and 3-0 Vicryl  suture.  The staples were placed in the skin.  Prior to closing the  groin incision, an intraoperative arteriogram was obtained by placing a  21-gauge Butterfly needle in the  proximal portion of the vein graft.  This was done with inflow occlusion.  This showed a patent distal  anastomosis with 3-vessel runoff, although the tibial arteries were  fairly diseased distally.  Preoperative arteriogram had showed that the  posterior tibial artery was the only vessel in continuity all the way of  the foot.   The needle was removed and the graft repaired with a single 6-0 Prolene  suture.  The saphenectomy incisions were repaired with running 2-0 and 3-  0 Vicryl suture and staples in the skin.  Dry sterile dressings were  applied to the leg incisions.  Next, attention was turned to the left  foot.  The left fourth toe was gangrenous throughout its entirety.  A  circumferential incision was made around the base of this in left foot.  Toe was then transected with bone cutters.  The toe was passed off the  table as a specimen.  Bone was debrided back to approximately the  proximal third of the metatarsal of the fourth toe.  There was some  bleeding tissue, but this was not overwhelming.  A moist saline gauze  dressing was applied to this area.  The patient tolerated the procedure  well, and there were no complications.  Instrument, sponge, and needle  counts were correct at the end of the case.  The patient was taken to  the recovery room in stable condition.      Janetta Hora. Fields, MD  Electronically Signed     CEF/MEDQ  D:  10/26/2008  T:  10/27/2008  Job:  [id number]

## 2010-10-08 NOTE — Op Note (Signed)
[name], [name]                ACCOUNT NO.:  [id number]   MEDICAL RECORD NO.:  [id number]          PATIENT TYPE:  AMB   LOCATION:  SDS                          FACILITY:  [facility]   PHYSICIAN:  Charles E. Fields, MD  DATE OF BIRTH:  1946-04-16   DATE OF PROCEDURE:  02/18/2008  DATE OF DISCHARGE:  02/17/2008                               OPERATIVE REPORT   PROCEDURE:  Left forearm AV graft.   PREOPERATIVE DIAGNOSIS:  End-stage renal disease.   POSTOPERATIVE DIAGNOSIS:  End-stage renal disease.   ANESTHESIA:  General.   ASSISTANT:  Nurse.   OPERATIVE FINDINGS:  A 6-mm PTFE with venous limb on ulnar aspect of  arm.   OPERATIVE DETAILS:  After obtaining informed consent, the patient was  taken to the operating room.  The patient was placed in supine position  on the operating table.  After induction of general anesthesia, the  patient's entire left upper extremity was prepped and draped in usual  sterile fashion.  A longitudinal incision was made near the antecubital  crease.  Incision was carried down through the subcutaneous tissues and  a large branch of the basilic system was dissected free  circumferentially.  This was proximally 3 mm in diameter.  Next,  brachial artery was dissected free medial to this.  The artery was  approximately 3 mm in diameter.  This was dissected free  circumferentially and small side branches were ligated and divided  between silk ties.  Vessel loops were then placed proximal to planned  site of arteriotomy.  Next, a subcutaneous tunnel was created in loop  configuration down the forearm with a transverse incision at the distal  forearm for assistance in tunneling.  A 6-mm PTFE graft was then brought  through the subcutaneous tunnel.  The patient was then given 5000 units  of intravenous heparin.  Vessel loops were used to control the brachial  artery proximally and distally.  Graft was beveled slightly and a  longitudinal opening made in the  brachial artery.  The graft was then  sewn end-to-end graft side of artery using a running 6-0 Prolene suture.  Just prior to completion of the anastomosis, this was forebled,  backbled, and thoroughly flushed.  Anastomosis was secured, clamps were  released.  There was a good pulsatile flow in the graft immediately.  Next, attention was turned to the venous anastomosis.  This large branch  of the basilic system was lying in a transverse type direction and the  graft is not going to align well.  Therefore, the vein was ligated  distally and transected.  The vein was then controlled proximally with a  fine bulldog clamp and opened longitudinally.  This allowed the vein to  be straightened out.  Graft was then beveled and sewn end-to-end to the  vein using a running 6-0 Prolene suture.  Just prior to completion of  anastomosis, this was forebled, backbleed, and thoroughly flushed.  Anastomosis was secured.  Clamps were released, just palpable thrill  above the graft immediately.  Next, hemostasis was obtained.  Subcutaneous tissues were reapproximated using  running 3-0 Vicryl suture  in both incisions.  The skin of both  incisions were then closed with 4-0 Vicryl subcuticular stitch.  The  patient tolerated procedure well and there were no complications.  Instrument, sponge, and needle counts were correct at the end of the  case.  The patient was taken to recovery room in stable condition.      Janetta Hora. Fields, MD  Electronically Signed     CEF/MEDQ  D:  02/17/2008  T:  02/18/2008  Job:  [id number]

## 2010-10-08 NOTE — Procedures (Signed)
BYPASS GRAFT EVALUATION   INDICATION:  Followup of a right SFA stent.   HISTORY:  Diabetes:  Yes.  Cardiac:  No.  Hypertension:  Yes.  Smoking:  Previous.  Previous Surgery:  Right SFA stent and left below knee amputation.   SINGLE LEVEL ARTERIAL EXAM                               RIGHT              LEFT  Brachial:                    204                AVG  Anterior tibial:             22  Posterior tibial:            113  Peroneal:  Ankle/brachial index:        0.55               BKA.   TBI RIGHT:  0.26   PREVIOUS ABI:  Date:  04/11/2009  RIGHT:  Noncompressible  LEFT:  BKA   LOWER EXTREMITY BYPASS GRAFT DUPLEX EXAM:   DUPLEX:  Monophasic Doppler waveforms throughout SFA stent and native  arteries.   IMPRESSION:  1. Patent stent with no evidence of stenosis.  2. Right ankle brachial index suggests moderate to severe disease.  3. Right toe brachial index abnormal.  4. Left brachial and ankle brachial index are not taken due to      arteriovenous graft and below knee amputation.      ___________________________________________  Janetta Hora Fields, MD   CJ/MEDQ  D:  06/20/2009  T:  06/20/2009  Job:  [id number]

## 2010-10-08 NOTE — Procedures (Signed)
CEPHALIC VEIN MAPPING   INDICATION:  Preop vein mapping for fistula access.   HISTORY:  End-stage renal disease.   EXAM:  The right cephalic vein is not visualized.  The right basilic  vein is compressible with diameter measurements ranging from 0.46 to  0.20 cm.  The left is not evaluated.   See attached worksheet for all measurements.   IMPRESSION:  Patent right basilic veins  with diameter measurements as  described above.   ___________________________________________  Janetta Hora. Fields, MD   LT/MEDQ  D:  04/26/2010  T:  04/26/2010  Job:  [id number]

## 2010-10-08 NOTE — Assessment & Plan Note (Signed)
OFFICE VISIT   [name], [name]  DOB:  [birth year]                                       10/03/2009  ZOXWR#:[id number]   The patient returns for followup today.  She underwent a right femoral  to below-knee popliteal bypass with vein on April 12.  She returns today  for further evaluation of her leg.  She was last seen on May 4, at which  time we removed half the staples.  Some of these were left in due to  edema.  She returns to the office today with no complaints.  She denies  any rest pain in her foot.  The edema in her right leg is essentially  resolved at this point.  Remainder of the staples were removed today.  The leg was warm on palpation.  Overall, she is doing well.  She will  return for followup and a graft surveillance scan in July of this year.  She will continue to take her Plavix and aspirin.     Janetta Hora. Fields, MD  Electronically Signed   CEF/MEDQ  D:  10/03/2009  T:  10/04/2009  Job:  [id number] [id number]

## 2010-10-08 NOTE — Assessment & Plan Note (Signed)
OFFICE VISIT   [name], [name]  DOB:  03/11/1946                                       03/07/2009  ZOXWR#:[id number]   The patient returns for followup today.  She previously had right SFA  stenting with a Viabahn graft on 12/29/2008.  We have been following her  for a poorly healing area of her left below knee amputation.  The  amputation was done in June of 2002.   On exam today the medial aspect of her left below knee amputation has a  2 x 1 x 1 cm open area with good granulation tissue at the base.  There  is no surrounding erythema.  The right foot is warm, pink and well-  perfused.  She has no real symptoms of rest pain in the right foot.   We will continue the wound care to her left BKA.  She will follow up in  six weeks' time.  We will place her on our stent surveillance protocol  for her right superficial femoral artery stent.  Of note, she continues  to take her Plavix once daily.   Janetta Hora. Fields, MD  Electronically Signed   CEF/MEDQ  D:  03/07/2009  T:  03/08/2009  Job:  [id number]

## 2010-10-08 NOTE — Assessment & Plan Note (Signed)
OFFICE VISIT   KAYONA, FOOR  DOB:  [birth year]                                       02/09/2008  ZOXWR#:[id number]   The patient is a 65 year old female with a history of hypertension and  diabetes.  She is now approaching the need for hemodialysis.  She  presents today for consideration of placement of hemodialysis access.  She is right handed.  She currently is not on dialysis.   PAST MEDICAL HISTORY:  Significant for diabetic nephropathy,  hypertension, decreased cardiac ejection fraction, fibromyalgia,  degenerative joint disease, peripheral neuropathy, back pain, and  coronary artery disease.   MEDICATIONS:  Include Synthroid 100 mcg once a day, Micardis 80 mg once  a day, Lantus insulin 46 units once a day, Darvocet p.r.n., aspirin 81  mg once a day, Lasix 40 mg twice a day, Plavix 75 mg once a day,  NitroQuick 0.4 mg p.r.n., Lipitor 80 mg once a day, Aranesp 100 mcg  every 2 weeks, stool softener p.r.n.  Atacand and Toprol were  discontinued.   PHYSICAL EXAM:  Blood pressure is 199/97 in the left arm.  She has 2+  brachial and radial pulse bilaterally.  She has obese upper arms.  On  placement of a tourniquet on the arm, there are no obvious visible  veins.   Vein-mapping ultrasound today showed diminutive cephalic vein in both  upper extremities.  The deep brachial basilic system is slightly larger,  approximately 50-60 mm in diameter.   Due to the patient's marginal superficial cephalic vein and her obese  upper arm, I believe the best option for her is going to be a forearm AV  graft.  We will schedule this to be placed in the left arm next week.  The Risks, benefits, and possible complications and procedure details  were described to the patient including but not limited to bleeding,  infection, graft thrombosis of 25% at 1 year.   Janetta Hora. Fields, MD  Electronically Signed   CEF/MEDQ  D:  02/09/2008  T:  02/10/2008  Job:  [id number]   cc:   Fayrene Fearing L. Deterding, M.D.

## 2010-10-08 NOTE — H&P (Signed)
Tina, [name]                ACCOUNT NO.:  [id number]   MEDICAL RECORD NO.:  [id number]          PATIENT TYPE:  INP   LOCATION:  6709                         FACILITY:  [facility]   PHYSICIAN:  Balinda Quails, M.D.    DATE OF BIRTH:  [birth year]   DATE OF ADMISSION:  10/21/2008  DATE OF DISCHARGE:                              HISTORY & PHYSICAL   ADMITTING PHYSICIAN:  Charles E. Fields, MD   ADMISSION DIAGNOSIS:  Ischemic rest pain, left foot.   HISTORY:  [name] is a 65 year old female with a history of known  peripheral vascular disease.  She also has end-stage renal disease,  diabetes mellitus, hyperlipidemia, and hypertension.  She has a history  of fibromyalgia.   She was seen in the [facility] office yesterday by Dr. Arbie Cookey with ischemic  changes in her left foot.  She was instructed that should her pain  become worse to present to the emergency department.  She presented to  the emergency department in the evening on Oct 21, 2008, with increasing  pain in her left foot.  She was initially seen by the emergency  physician and referred to the doctor on-call at [facility].   The patient has had pain for several weeks in her left foot.  She denies  any trauma.  She is having trouble sleeping.   She has had an arteriogram in October 2009 revealing a left superficial  femoral artery occlusion.   PAST MEDICAL HISTORY:  1. End-stage renal disease.  2. Diabetes mellitus.  3. Hyperlipidemia.  4. Hypertension.  5. Fibromyalgia.  6. Hypothyroidism.  7. Gastroesophageal reflux disease.   MEDICATIONS:  1. Lantus 50 units subcu at bedtime.  2. Plavix 75 mg daily.  3. Levothyroxine 100 mcg daily.  4. Simvastatin 40 mg daily.  5. Amlodipine besylate 10 mg daily.  6. Micardis 80 mg b.i.d.  7. Colace 100 mg daily.  8. Calcium acetate 667 mg b.i.d.  9. Alprazolam 0.25 mg b.i.d. p.r.n.  10.Lasix 80 mg b.i.d.   ALLERGIES:  AUGMENTIN causes hives.   SOCIAL HISTORY:  The patient is  divorced and lives alone.  She does not  consume alcohol or use tobacco products.  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   Family history is significant for hypertension and diabetes.   REVIEW OF SYSTEMS:  The patient notes primarily pain in her left foot.   PHYSICAL EXAMINATION:  GENERAL:  Moderately obese 65 year old Hispanic  female.  Alert and oriented.  No distress.  VITAL SIGNS:  Blood pressure is 123/57, pulse is 72 per minute,  respirations 20 per minute, O2 sat 98%.  HEENT:  Mouth and throat clear.  Normocephalic.  Extraocular movements  intact.  NECK:  Supple.  No thyromegaly or adenopathy.  CARDIOVASCULAR:  Normal heart sounds without murmurs.  Regular rate and  rhythm.  No gallops or rubs.  No carotid bruits.  CHEST:  Equal air entry bilaterally.  No rales or rhonchi.  ABDOMEN:  Moderate obesity.  Nontender.  No masses or organomegaly.  LOWER EXTREMITIES:  Femoral 2+ pulses  bilaterally.  No palpable  popliteal, posterior tibial, or dorsalis pedis pulses bilaterally.  The  left foot does reveal chronic ischemic changes with dependent rubor,  marked ischemia noted of the first and fourth toes with marked cyanosis  and potential nonviability of the fourth toe.  NEUROLOGIC:  Cranial nerves intact.  Strength equal bilaterally.  1+  reflexes.   ADMISSION DIAGNOSIS:  Ischemic left foot extending.   SECONDARY DIAGNOSES:  1. Type 2 diabetes.  2. Hyperlipidemia.  3. Hypertension.  4. End-stage renal failure.  5. Hypothyroidism.  6. Gastroesophageal reflux disease.  7. Fibromyalgia.   ADMISSION PLAN:  The patient will be admitted to the hospital for pain  control with IV morphine, intravenous heparin to be begun, placed on  bedrest, and plans for possible arteriogram early next week.   The patient understands that this is a limb salvage situation and there  is potential nonviability of her left fourth toe.      Balinda Quails, M.D.  Electronically  Signed     PGH/MEDQ  D:  10/22/2008  T:  10/22/2008  Job:  [id number]

## 2010-10-08 NOTE — Op Note (Signed)
[name], [name]                ACCOUNT NO.:  [id number]   MEDICAL RECORD NO.:  [id number]          PATIENT TYPE:  AMB   LOCATION:  SDS                          FACILITY:  [facility]   PHYSICIAN:  Charles E. Fields, MD  DATE OF BIRTH:  28-Oct-1945   DATE OF PROCEDURE:  03/03/2008  DATE OF DISCHARGE:                               OPERATIVE REPORT   PROCEDURE:  Left lower extremity arteriogram.   PREOPERATIVE DIAGNOSIS:  Nonhealing wound, left foot.   POSTOPERATIVE DIAGNOSIS:  Nonhealing wound, left foot.   ANESTHESIA:  Local with IV sedation.   OPERATIVE DETAIL:  After obtaining the informed consent, the patient was  taken to the [facility] lab.  The patient was placed in supine position on the  angio table.  Both groins were prepped and draped in usual sterile  fashion.  Local anesthesia was infiltrated over the right common femoral  artery.  The patient was also given 25 mcg of fentanyl for sedation.  Next, a Majestic needle was used to cannulate the right common femoral  artery without difficulty.  A 0.035 Wholey wire was then inserted into  the abdominal aorta under fluoroscopic guidance.  Next, a 5-French  sheath was placed over the guidewire into the right common femoral  artery.  This was thoroughly flushed with heparinized saline.  A 5-  [place] crossover catheter was then brought up in the operative field and  this was advanced up to the level of the aortic bifurcation.  Attempt  was made to pass the [facility] wire down into the left iliac system, but  this would not advance easily.  Therefore, this was pulled back and a  0.035 angled Glidewire was advanced easily down into the left external  iliac artery.  Crossover catheter was removed over the guidewire and a 5-  French end-hole catheter placed over the guidewire down into the distal  left external iliac artery.  Left lower extremity arteriogram was  obtained.  This shows that the left external iliac artery is widely  patent.  The  left common femoral profunda femoris artery is widely  patent.  The left superficial femoral artery is patent down to the level  of the knee.  At the adductor hiatus, there are several areas of  moderate stenosis.  The popliteal artery has a string sign extending  from just above the femoral condyle all the way down to the knee joint.  Popliteal artery is small, but smooth in character below the knee joint.  The anterior tibial artery, tibioperoneal trunk are patent.  The  peroneal artery occludes in the midleg.  The posterior tibial artery is  patent all the way to the level of foot, but is tiny at the level of the  ankle.  The dorsalis pedis artery is patent all the way to the level of  the foot and then becomes diffusely small down in the level of the foot.  There is evidence of small vessel disease out in the foot itself.   Next, the 5-French end-hole catheter was removed over a guidewire.  The  5-French sheath was  left in place to be pulled in the holding area.   Total of 40 mL of contrast was used for the exam.   OPERATIVE FINDINGS:  1. Diffuse disease, subtotal occlusion of distal left superficial      femoral artery and popliteal artery.  2. Patent below-the-knee popliteal artery with runoff primarily via      the anterior tibial and posterior tibial artery.  3. Diffuse small-vessel disease in the left foot.      Janetta Hora. Fields, MD  Electronically Signed     CEF/MEDQ  D:  03/03/2008  T:  03/03/2008  Job:  [id number]

## 2010-10-08 NOTE — Assessment & Plan Note (Signed)
OFFICE VISIT   [name], [name]  DOB:  December 14, 1945                                       04/11/2009  ZOXWR#:[id number]   The patient returns for follow-up today.  She was last seen in October.  She previously had right superficial femoral artery stenting with a  Viabahn graft in August 2010.  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  This was done in June  2010.   Her primary atherosclerotic risk factors include diabetes, hypertension  and coronary artery disease.  These are all currently under control.  She also has end-stage renal disease and is on dialysis.  She has had no  problems with this.   Currently she denies any claudication symptoms.  She has not been fitted  for a prosthetic on the left side due to the fact that the wound was not  completely healed.   REVIEW OF SYSTEMS:  She denies any shortness of breath or chest pain.   PHYSICAL EXAMINATION:  Blood pressure is 173/83 in the left arm, pulse  is 76 and regular.  Temperature is 98.4.  Chest is clear to  auscultation.  Cardiac exam is regular rate and rhythm.  Extremities:  She has 2+ femoral pulses bilaterally.  She has absent popliteal and  pedal pulses in the right leg.  She has no edema in the right lower  extremity.  Her left below-knee amputation is completely healed at this  point.   She had a right-sided duplex exam of her superficial femoral artery  stent today, which showed slightly increased velocity of the proximal  aspect of the stent, suggesting possible 50% stenosis.  Toe pressure on  the right side was 113 compared to an arm pressure of 179.   Overall, the patient seems to be doing well.  She continues to take her  Plavix for maintaining patency of her stent.  She will follow up in our  stent surveillance protocol at this point.  A shrinker was prescribed  for her today for her below-knee amputation.  Hopefully, she will be  able to be able to be fitted for a prosthetic for this in the near  future.   Janetta Hora. Fields, MD  Electronically Signed   CEF/MEDQ  D:  04/12/2009  T:  04/12/2009  Job:  [id number]

## 2010-10-08 NOTE — Assessment & Plan Note (Signed)
OFFICE VISIT   [name], [name]  DOB:  1946/04/08                                       04/25/2010  VOZDG#:[id number]   The patient returns for followup today.  She recently had ligation of  her left arm AV graft for steal syndrome.  Her symptoms resolved  immediately after ligation of the graft.  She returns today for further  followup and consideration placement of new hemodialysis access.  She  currently is dialyzing via left-sided catheter.   She has had no prior access procedures in the right side.   PHYSICAL EXAM:  Blood pressure is 163/83 in the right arm, heart rate is  91 and regular.  Temperature is 97.9.  She has 2+ brachial and radial  pulse in the right arm.  She has an obese upper arm.  She has no obvious  visible veins on the skin.  She had a vein mapping ultrasound today  which showed the basilic vein on the right side is between 20 and 38 mm  in diameter.  It is 20 mm in diameter near the antecubital area and then  proceeds to 40 mm in diameter in the upper arm.   I discussed with the patient today that she may have too short of a  segment of basilic vein for transposition but that we would explore the  vein to try to do a basilic vein transposition fistula.  If the vein is  of small caliber or not usable for basilic vein transposition we will  place a forearm AV graft at the same time.  Risks, benefits, possible  complications and procedure details were explained to the patient today  including but not limited to bleeding, infection, steal, graft  thrombosis, nonmaturation of fistula.  She understands and agrees to  proceed.  Her procedure is scheduled for 05/07/2010.     Janetta Hora. Fields, MD  Electronically Signed   CEF/MEDQ  D:  04/25/2010  T:  04/26/2010  Job:  [id number]   cc:   [facility]

## 2010-10-08 NOTE — Procedures (Signed)
LOWER EXTREMITY ARTERIAL DUPLEX   INDICATION:  Increased redness and pain.   HISTORY:  Diabetes:  Yes.  Cardiac:  Yes.  Hypertension:  Yes.  Smoking:  Previous.  Previous Surgery:  Right superficial femoral artery stent/left below-  knee amputation.   SINGLE LEVEL ARTERIAL EXAM                          RIGHT                LEFT  Brachial:               158                  Graft  Anterior tibial:        >255                 >255  Posterior tibial:       Inaudible  Peroneal:  Ankle/Brachial Index:   Noncompressible      BKA   LOWER EXTREMITY ARTERIAL DUPLEX EXAM   DUPLEX:  The right superficial femoral artery, popliteal, and posterior  tibial artery appears occluded.   IMPRESSION:  1. There does not appear to be any flow noted in the right superficial      femoral artery, popliteal artery, and posterior tibial artery.  2. Right ankle brachial index appears uncompressible with no flow      noted in the right toe and right anterior tibials.   ___________________________________________  Janetta Hora Fields, MD   CB/MEDQ  D:  08/22/2009  T:  08/22/2009  Job:  [id number]

## 2010-10-08 NOTE — Assessment & Plan Note (Signed)
OFFICE VISIT   SYDNY, SCHNITZLER C  DOB:  Jul 06, 1945                                       03/08/2008  ZOXWR#:[id number]   The patient returns for followup today after placement of a left forearm  AV graft on 02/18/2008.  The graft was doing well apparently until 3-4  days ago when she began to have some drainage from the antecubital  incision.  She denies any fever or chills.  She states that her foot  which we have also been following is stable overall.   PHYSICAL EXAMINATION:  On exam today blood pressure is 213/98 in the  left arm, pulse is 87 and regular.  Temperature is 97.8.  On exam there  is a small sinus tract approximately 1 mm in diameter near the  antecubital incision.  This is draining some serosanguineous drainage.  There is mild surrounding erythema approximately 3 cm.   Hopefully the patient has only a subcutaneous infection from her  incision.  The infection does not seem to be deep seated and involving  the graft at this point.  However, I believe she needs to be placed on  antibiotics to hopefully control and contain this area of inflammation.  I have started her on doxycycline for a 10 day course.  She will follow  up with me next week for further evaluation.  We will need to have all  of the issues resolved in her upper extremity before we consider doing a  fem-pop bypass of her leg.   Janetta Hora. Fields, MD  Electronically Signed   CEF/MEDQ  D:  03/08/2008  T:  03/09/2008  Job:  [id number]

## 2010-10-08 NOTE — Op Note (Signed)
[name], [name]                ACCOUNT NO.:  [id number]   MEDICAL RECORD NO.:  [id number]          PATIENT TYPE:  INP   LOCATION:  6709                         FACILITY:  [facility]   PHYSICIAN:  Janetta Hora. Fields, MD  DATE OF BIRTH:  11/07/1945   DATE OF PROCEDURE:  10/24/2008  DATE OF DISCHARGE:                               OPERATIVE REPORT   PROCEDURE:  Aortogram with left lower extremity runoff.   PREOPERATIVE DIAGNOSIS:  Ischemia, left foot.   POSTOPERATIVE DIAGNOSIS:  Ischemia, left foot.   ANESTHESIA:  Local with IV sedation.   OPERATIVE DETAILS:  After obtaining informed consent, the patient was  taken to the [facility] Lab.  The patient was placed in the supine position on  the angio table.  Right groin was prepped and draped in the usual  sterile fashion.  Local anesthesia was infiltrated over the area of the  right common femoral artery.  An introducer needle was used to cannulate  the right common femoral artery and a 0.035 Versacore wire was threaded  in the abdominal aorta under fluoroscopic guidance.  Next, a 5-French  sheath was placed over the guidewire in the right common femoral artery.  This was thoroughly flushed with heparinized saline.  A 5-French pigtail  catheter was then placed over the guidewire and abdominal aortogram was  obtained.  This shows bilateral single renal arteries which were patent.  There was no narrowing in the abdominal aorta, left and right common  iliac, left and right external and internal iliac arteries.  Next, the  pigtail catheter was pulled down to the aortic bifurcation.  The pelvic  angiogram was performed.  Again, this showed wide patency of the  internal and external iliac arteries bilaterally.  Left and right common  femoral arteries were patent bilaterally.  At this point, pigtail  catheter was removed over a guidewire and a 5-French crossover catheter  brought up in the operative field.  This was used to selectively  catheterize  the left common iliac artery.  A 0.035 angled Glidewire was  then advanced through the crossover catheter down into the distal left  external iliac artery.  Crossover catheter was then exchanged for a 5-  French straight catheter.  Straight catheter was then advanced down to  the distal left external iliac artery.  Left lower extremity arteriogram  was obtained.  This showed patency of the left common femoral and  profunda femoris artery.  The left superficial femoral artery was  diffusely diseased with several areas of subtotal occlusion.  The left  popliteal artery was also similarly severely diseased with several areas  of diffuse disease and greater than 90% stenosis.  The below-knee  popliteal artery did reconstitute distally.  This was a reasonable  quality vessel below the knee.  The origins of the anterior and  posterior tibioperoneal trunk were patent.  The tibial arteries were  diffusely diseased with the peroneal artery occluding in the mid leg and  the anterior tibial artery occluding in the mid leg.  Primary runoff was  via the posterior tibial artery which was a  diseased vessel, but with no  actual focal stenosis but diffusely narrowed.  This was in continuity  all the way down to the level of the left foot.  At this point, the  straight catheter was removed and the 5-French sheath was removed and  hemostasis was obtained with direct pressure.  The patient tolerated the  procedure well and there were no complications.  The patient was taken  to the holding area in stable condition.   OPERATIVE FINDINGS:  1. No significant inflow disease of the aortoiliac segments, left leg.  2. Diffuse atherosclerotic occlusive disease, left superficial femoral      artery with multiple areas of high-grade      stenosis and subtotal occlusion.  3. Subtotal occlusion of left above-knee popliteal artery.  4. Patent left below-knee popliteal artery with one-vessel runoff via      posterior  tibial artery.      Janetta Hora. Fields, MD  Electronically Signed     CEF/MEDQ  D:  10/24/2008  T:  10/24/2008  Job:  [id number]

## 2010-10-08 NOTE — Procedures (Signed)
CEPHALIC VEIN MAPPING   INDICATION:  Evaluation of cephalic veins prior to placement of dialysis  access.   HISTORY:  Patient is not currently on dialysis; however, has poorly functioning  kidneys.   EXAM:   The right cephalic vein is compressible.   Diameter measurements range from 0.12 to 0.64 cm from the antecubital  fossa to the wrist.   The left cephalic vein is compressible.   Diameter measurements range from 0.54 cm to 0.10 cm from the antecubital  fossa to the wrist.   See attached worksheet for all measurements.   IMPRESSION:  1. Patent bilateral cephalic vein which is not of acceptable diameter      for use as a dialysis access site.  2. Upper arm portions of cephalic veins were not identified.   ___________________________________________  Janetta Hora Fields, MD   MC/MEDQ  D:  02/09/2008  T:  02/09/2008  Job:  [id number]

## 2010-10-08 NOTE — Consult Note (Signed)
NAMESUGEY, TREVATHAN                ACCOUNT NO.:  [id number]   MEDICAL RECORD NO.:  [id number]          PATIENT TYPE:  INP   LOCATION:                               FACILITY:  [facility]   PHYSICIAN:  Maree Krabbe, M.D.DATE OF BIRTH:  April 13, 1946   DATE OF CONSULTATION:  10/22/2008  DATE OF DISCHARGE:                                 CONSULTATION   CHIEF COMPLAINT:  Left foot pain.   HISTORY OF PRESENT ILLNESS:  This is a 65 year old female admitted for  ischemic left foot with severe left foot pain with underlying end-stage  renal disease on hemodialysis on Monday, Wednesday, and Friday at [facility].  End-stage renal disease thought to be secondary to diabetes.  Her  last day of hemodialysis was Oct 20, 2008.  Vascular surgery had her  admitted with plans for arteriogram and possible bypass surgery this  week.  We were consulted because she needs hemodialysis while she is  hospitalized.   PAST MEDICAL HISTORY:  1. End-stage renal disease, on hemodialysis on Monday, Wednesday, and      Friday secondary to diabetes.  2. Diabetes type 2.  3. Hypertension.  4. Coronary artery disease, status post stents.  5. Hypothyroidism.  6. Secondary hyperparathyroidism.  7. Anemia.  8. Hyperlipidemia.   ALLERGIES:  PENICILLIN.   The patient is a [employer] and cannot obtain blood products.   MEDICATIONS:  1. Amlodipine 10 mg p.o. daily.  2. PhosLo 667 mg t.i.d. with meals.  3. Lantus 50 units subcu at bedtime.  4. Levothyroxine 112 mcg p.o. daily.  5. Micardis 80 mg p.o. daily.  6. Plavix 75 mg p.o. daily.  7. Simvastatin 40 mg p.o. daily.  8. Docusate 100 mg p.o. daily.  9. Xanax 0.25 mg p.o. b.i.d.   Hemodialysis prescription, estimated dry weight looks seconds around 100  kg per the patient.  Length of treatment 3 hours and 45 minutes.  Accesses, AV graft to the left upper arm placed in September 2009.   SOCIAL HISTORY:  The patient was a former smoker, quit 30+ years  ago.   PHYSICAL EXAMINATION:  VITAL SIGNS:  Temperature is 98.4, pulse 71,  blood pressure 113/62, respiratory rate 16, breathing comfortably on  room air.  GENERAL:  Alert and oriented.  No apparent distress.  CARDIOVASCULAR:  Regular rate and rhythm.  No murmurs, rubs, or gallops.  LUNGS:  Clear to auscultation bilaterally.  ABDOMEN:  Soft, nontender, nondistended, obese.  ACCESS:  She has an AV graft in the left upper arm with a positive  bruit.  EXTREMITIES:  She has trace edema of the lower extremities bilaterally.  She has 2+ edema of the feet bilaterally.  No palpable dorsalis pedis  pulse on the left.  She has severe discoloration ulceration of the  second toe extremely tender to palpation of the left foot.  NEUROLOGIC:  She is alert and oriented x3.  Cranial nerves II-XII  grossly intact.   LABORATORIES AND X-RAYS:  She had a left foot x-ray done on Oct 21, 2008, that showed soft tissue swelling, osteopenia  vascular,  calcification, but no focal or acute bony findings, no osteomyelitis.  Today's labs are all pending.  Yesterday's hemoglobin was 11.3.  Potassium yesterday was 5.2.  BUN yesterday was 26 and creatinine was  6.62.  She has blood cultures that are pending.   ASSESSMENT AND PLAN:  This is a 65 year old female admitted for ischemic  left foot with end-stage renal disease on hemodialysis.  1. End-stage renal disease.  She gets hemodialysis on Monday,      Wednesday, and Friday.  No indication for emergent hemodialysis      today.  Our plan for her is hemodialysis tomorrow.  We will contact      [facility] in the morning to obtain records.  We will add Nephro-      Vite to her medication list.  2. Anemia.  The patient states she is unable as outpatient.  I will      give this with her hemodialysis once we obtain the records.  She      has had a recent iron infusion.  3. Bones.  We will continue her on the PhosLo that she is on.  We will      follow the phos and  the calcium daily.  4. Hypertension, currently controlled on Norvasc and Benicar.  We will      change Norvasc to at bedtime.  5. Vascular.  She has ischemic left foot.  Plan is to do arteriogram      and possible bypass this week per Dr. Madilyn Fireman.  She is currently on      heparin because we are holding the Plavix.  She is on vancomycin      for possible infection.  Our goal is pain control with morphine.  6. FEN.  She is on a renal diet.  Electrolytes were stable.  7. Disposition per primary.      Marisue Ivan, MD  Electronically Signed      Maree Krabbe, M.D.  Electronically Signed    KL/MEDQ  D:  10/22/2008  T:  10/23/2008  Job:  [id number]

## 2010-10-08 NOTE — Op Note (Signed)
[name], [name]                ACCOUNT NO.:  [id number]   MEDICAL RECORD NO.:  [id number]          PATIENT TYPE:  AMB   LOCATION:  SDS                          FACILITY:  [facility]   PHYSICIAN:  Charles E. Fields, MD  DATE OF BIRTH:  [birth year]   DATE OF PROCEDURE:  12/22/2008  DATE OF DISCHARGE:                               OPERATIVE REPORT   PROCEDURE:  Right lower extremity arteriogram.   PREOPERATIVE DIAGNOSIS:  Ischemia, right lower extremity.   POSTOPERATIVE DIAGNOSIS:  Ischemia, right lower extremity.   ANESTHESIA:  Local.   OPERATIVE DETAILS:  After obtaining informed consent, the patient was  brought to the [facility] lab.  The patient placed in supine position on the  angio table.  Right groin was prepped and draped in usual sterile  fashion.  Local anesthesia was infiltrated over the right common femoral  artery.  Using fluoroscopic guidance, the right common femoral artery  was punctured just above the femoral bifurcation.  A 0.035 Versacore  wire was then threaded into the abdominal aorta under fluoroscopic  guidance.  A 5-French sheath was then placed over the guidewire, and  this was thoroughly flushed with heparinized saline.  Guidewire was then  removed.  The right lower extremity arteriogram was then obtained.  This  was done through the 5-French sheath and the right common femoral  artery.  This shows a patent right external iliac and common femoral  artery.  The profunda femoris artery is patent.  The right superficial  femoral artery is patent but severely diseased with multiple segments  throughout its course of 50-80% stenosis.  Popliteal artery is diseased  above the knee but then becomes less diseased below the knee, it is  patent throughout its course.  The anterior tibial artery is patent  throughout its course and is the dominant runoff vessel to the right  foot.  The tibioperoneal trunk is patent.  The peroneal artery is  diminutive at the ankle and occludes  and gives off minimal collaterals.  The posterior tibial artery occludes in the middle of the leg.  There is  diffuse small vessel disease in the right foot with minimal filling of  digital vessels out in the right foot.   At this point, the 5-French sheath was thoroughly flushed again with  heparinized saline.  The patient was taken to the holding area in stable  condition to have the sheath removed in that location.  The patient  tolerate the procedure well and there were no complications.   OPERATIVE FINDINGS:  1. Diffuse right superficial femoral artery occlusive disease with 1-      vessel runoff via the anterior tibial artery.  2. Plan is to bring the patient back next Friday for repeat      arteriogram for intervention of angioplasty and possible stenting      of her right superficial femoral artery via a left femoral or a      left brachial approach.      Janetta Hora. Fields, MD  Electronically Signed    CEF/MEDQ  D:  12/22/2008  T:  12/22/2008  Job:  [id number]

## 2010-10-08 NOTE — Procedures (Signed)
BYPASS GRAFT EVALUATION   INDICATION:  Followup right superficial femoral artery stents.   HISTORY:  Diabetes:  yes  Cardiac:  MI, CHF  Hypertension:  yes  Smoking:  Previous  Previous Surgery:  Right superficial femoral artery on 12/29/2008, left  BKA   SINGLE LEVEL ARTERIAL EXAM                               RIGHT              LEFT  Brachial:                    179                AVG  Anterior tibial:             Right biphasic     Right biphasic  Posterior tibial:            Right biphasic     Right biphasic  Peroneal:  Ankle/brachial index:        Not obtained       BKA   PREVIOUS ABI:  Date: 01/17/2009  RIGHT:  Noncompressible (TBI = 0.04).  LEFT:  BKA   LOWER EXTREMITY BYPASS GRAFT DUPLEX EXAM:   DUPLEX:  Patent right common femoral artery, superficial femoral artery,  and popliteal artery with biphasic Doppler waveforms throughout.   Elevated velocities of 321 cm/s suggestive of greater than 50% stenosis  in proximal superficial femoral artery near origin.   IMPRESSION:  Right ankle brachial index not obtained due to known  noncompressible vessels, left below knee amputation.  Right ankle waveforms appear biphasic with a TBI of 0.63, a slight  decrease from previous study.  Patent right superficial femoral artery stents with elevated velocities  suggestive of greater than 50% stent in the proximal superficial femoral  artery near origin.   ___________________________________________  Janetta Hora. Fields, MD   AS/MEDQ  D:  04/11/2009  T:  04/11/2009  Job:  [id number]

## 2010-10-08 NOTE — Assessment & Plan Note (Signed)
OFFICE VISIT   TAZIAH, DIFATTA C  DOB:  Oct 04, 1945                                       05/31/2008  [id number]#:[id number]   The patient returns for follow-up today of a toe ulceration on her left  foot.  She previously had an ulcer on the tip of her left third toe.  I  am pleased to report that the ulcer has now completely healed.  It was  previously 0.5 cm x 0.5 cm and we were considering doing a femoral  popliteal bypass.  She denies any rest pain.  I believe the best option  for now is continued close observation but no intervention currently is  warranted.  Of note, her left arm AV graft is functioning.  She is  currently not on dialysis.  It did have an audible bruit and palpable  thrill today.  She has no evidence of steal.   We will see her again in three months' time for bilateral ABIs.  She  will return sooner if she develops new ulcerations.  She will schedule a  podiatry appointment to evaluate her nails for trimming.   Janetta Hora. Fields, MD  Electronically Signed   CEF/MEDQ  D:  05/31/2008  T:  06/01/2008  Job:  [id number]   cc:   [facility]

## 2010-10-08 NOTE — Assessment & Plan Note (Signed)
OFFICE VISIT   [name], [name]  DOB:  01-06-1946                                       12/13/2008  [id number]#:[id number]   This is a 65 year old female who previously underwent left femoral below  knee popliteal bypass.  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  She  spent several weeks in rehab and now returns for further follow-up.  She  states that she has been having some pain in her right foot.   MEDICATIONS:  Zocor 40 mg q.h.s., Nephro-Vite once daily, Synthroid 112  mcg once a day, PhosLo 667 mg t.i.d. before meals, Plavix 75 mg once a  day, Protonix 40 mg once a day, OxyContin CR 10 mg q.12 hours, Senokot  q.h.s., Lantus insulin 10 units q.h.s.,  oxycodone IR p.r.n., Xanax  b.i.d., Hectorol 2 mcg IV Monday, Wednesday, and Friday with  hemodialysis, Aranesp 2 mcg IV on Mondays with dialysis.   SOCIAL HISTORY:  The patient is Jehovah's Witness and does not take  transfusions.   PHYSICAL EXAMINATION:  Blood pressure is 160/81 in the right arm, pulse  70 and regular.  Temperature is 98.  HEENT:  Unremarkable.  Neck:  Has  2+ carotid pulses without bruit.  Chest:  Clear to auscultation. Cardiac  Exam:  Regular rate rhythm without murmur.  Abdomen is obese, soft,  nontender, nondistended.  Extremities:  The left groin wound is  continuing to heal.  There are 2 areas at the superior aspect incision  that are 1-cm in diameter that have granulation tissue in the base and  should be healed soon.  Her left below knee amputation site is healing  well except for a 3-cm segment on the medial aspect of the incision.  There is some dry eschar on this location and staples are removed from  this portion today.  Right lower extremity shows a 2+ right femoral  pulse with absent popliteal and pedal pulses.  She has dependent rubor  of the right foot.  She has elevation pallor.  She has no ulcerations  on  the right foot.  Vessels are calcified in the right leg and ABI's are  unreliable.   She currently dialyzes on Monday, Wednesday, and Friday.  I believe the  best option for her would be a right lower extremity arteriogram at this  point as I am concerned that she is going to lose her right foot next  and has fairly severe ischemia on this side.  Her left below knee  amputation is healing somewhat but still has a ways to go.  The wounds  in her left groin, I think, will heal spontaneously within a few weeks.  We will have to schedule her arteriogram around her dialysis.  Her  arteriogram is scheduled for December 22, 2008.  She will discuss with her  center possibly doing dialysis on that Saturday.  I did discuss with her  the risks, benefits, possible complications and procedure details of the  arteriogram including not limited to bleeding, infection arterial  injury.  She understands and agrees to proceed.  We will also need to  make sure that her hemoglobin is reasonable prior to starting any  operation since she is a Jehovah's Witness and will not take  transfusions.  Also, if  she requires a lower extremity bypass procedure,  we will need to stop her Plavix 7-10 days prior to this.   Janetta Hora. Fields, MD  Electronically Signed   CEF/MEDQ  D:  12/14/2008  T:  12/14/2008  Job:  [id number]   cc:   [facility]  Juline Patch, M.D.  James L. Deterding, M.D.  Ranelle Oyster, M.D.

## 2010-10-08 NOTE — Op Note (Signed)
[name], [name]                ACCOUNT NO.:  [id number]   MEDICAL RECORD NO.:  [id number]          PATIENT TYPE:  AMB   LOCATION:  SDS                          FACILITY:  [facility]   PHYSICIAN:  Charles E. Fields, MD  DATE OF BIRTH:  Jun 11, 1945   DATE OF PROCEDURE:  12/29/2008  DATE OF DISCHARGE:  12/29/2008                               OPERATIVE REPORT   PROCEDURE:  1. Right lower extremity arteriogram.  2. Stenting of right superficial femoral artery.   PREOPERATIVE DIAGNOSIS:  Rest pain, right foot.   POSTOPERATIVE DIAGNOSIS:  Rest pain, right foot.   ANESTHESIA:  Local.   OPERATIVE DETAILS:  After obtaining informed consent, the patient was  taken to the PV lab.  The patient was placed in supine position on the  angio table.  Left groin was prepped and draped in usual sterile  fashion.  Local anesthesia was infiltrated over the left common femoral  artery.  An introducer needle was used to cannulate the left common  femoral artery and a 0.035 Rosen wire advanced into the abdominal aorta  under fluoroscopic guidance.  A 5-French sheath was then placed over the  guidewire in the left common femoral artery.  Next, a 5-French crossover  catheter was used to selectively catheterize the right common iliac  artery.  A 0.035 Wholey wire was then advanced down to the level of the  right external iliac artery.  A 4-French straight catheter was then  placed over the guidewire down into the distal right external iliac  artery, and a right lower extremity arteriogram was obtained.  This  shows a patent right common femoral and profunda femoris artery.  The  right superficial femoral artery is diffusely diseased.  There were  several areas of 80% stenosis in the proximal third of the artery.  There were several areas of 50-70% stenosis diffusely throughout the  artery.  The above-knee popliteal artery is patent with poststenotic  dilatation.  The below-knee popliteal artery has some  atherosclerotic  changes but is patent.  There is 1-vessel runoff via the anterior tibial  artery.  The peroneal and posterior tibial arteries occlude in the mid  leg.   Next, the 5-French sheath was exchanged for a 7-French Terumo sheath  over the [facility] wire.  This was advanced up and over the aortic  bifurcation.  The patient was then given 7000 units of intravenous  heparin.  The patient was also given an additional 2000 units of heparin  during the course of the case.  The Terumo sheath was advanced all the  way down to the level of the proximal right common femoral artery.  The  [facility] wire was then advanced down the right superficial femoral artery  and all the way past the level of disease in the superficial femoral  artery down into the distal popliteal artery.  Next, a 5 x 120-mm  balloon was brought up in the operative field and the superficial  femoral artery was angioplastied from the level of the femoral condyle  up in the superficial femoral artery.  The entire superficial  femoral  artery was ballooned to 5 mm all the way back to its origin.  This  required several inflations with overlap.  At this point, a 5 x 150-mm  Viabahn graft was brought up in the operative field and this was placed  over the guidewire down to the level of the femoral condyle within  superficial femoral artery.  This was then deployed in the usual  fashion.  An additional 5 x 150 Viabahn graft was then brought up in the  operative field and this was overlapped 1 cm with the lower stent.  An  additional 6 x 150-mm Viabahn stent was then brought up in the operative  field and this was overlapped with the middle stent and extended all the  way up to approximately 3 cm below the level of the SFA origin.  A 6-mm  balloon was then brought up on the field and the entire stented segment  was angioplastied to 8 atmospheres for 1 minute with overlapping of  angioplasty ballooning.  A right lower extremity  arteriogram was then  obtained.  This showed still a residual proximal narrowing in the SFA  just above the proximal aspect of the most proximal Viabahn stent.  Therefore, an additional 6-mm x 50-mm Viabahn stent was brought up in  the operative field and this stent was extended all the way up to the  right superficial femoral artery origin.  This was then molded with a 6-  mm balloon to fully dilate the stent up to 6 mm.  This was also done to  8 atmospheres for 1 minute.  An additional right lower extremity  arteriogram was then obtained.  This shows a widely patent right  superficial femoral artery with the stent all the way up to the origin  of the right superficial femoral artery.  Popliteal artery is patent.  The anterior tibial artery is patent with runoff via the dorsalis pedis  artery.  The patient was also given 200 mcg of nitroglycerin down the  sheath to eliminate any arterial vasospasm in the tibial arteries.  The  patient tolerated the procedure well, and there were no complications.  The Terumo sheath was pulled back to the level of the pelvis over a  guidewire.  This was left in place to be pulled after the ACT was less  than 175.  The patient tolerated the procedure well, and there were no  complications.  The patient was taken to the holding area in stable  condition.   OPERATIVE FINDINGS:  1. 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  2. Viabahn stenting from femoral condyle to level of the SFA origin      with two 5 x 150, one 6 x 150, and one 6 x 50 Viabahn stent.      Janetta Hora. Fields, MD  Electronically Signed     Janetta Hora. Fields, MD  Electronically Signed    CEF/MEDQ  D:  12/29/2008  T:  12/30/2008  Job:  [id number]

## 2010-10-11 NOTE — Op Note (Signed)
NAMEKAICEE, SCARPINO                ACCOUNT NO.:  [id number]   MEDICAL RECORD NO.:  [id number]          PATIENT TYPE:  INP   LOCATION:  4708                         FACILITY:  [facility]   PHYSICIAN:  Richard A. Alanda Amass, M.D.DATE OF BIRTH:  07-27-1945   DATE OF PROCEDURE:  04/25/2005  DATE OF DISCHARGE:                                 OPERATIVE REPORT   PROCEDURE:  Retrograde central aortic catheterization, selective coronary  angina angiography via Judkins technique, intracoronary nitroglycerin  administration, PTCA and subsequent triple tandem stenting for high-grade  subtotal culprit lesion LAD and mid-distal, mid-and-proximal stenosis,  double bolus Aggrastat plus infusion, Plavix 600 mg p.o., continued aspirin,  weight adjusted heparin 5000 units, Pepcid 20 mg IV, prophylactic Ancef 1 gm  IV, left common femoral artery 6-French StarClose closure device successful.   BRIEF HISTORY:  Please refer to excellent history and physical by Dr. Elliot Cousin and and subsequent notes and cardiology note by her primary  cardiologist, Dr. Antionette Char.  This is a very interesting, and pleasant  65 year old, divorced, mother of one son (expired) and three grandchildren  that live in the [place].  She is from the [place], [place], has lived in  [place] for a long time, and her elderly mother lives with her.  Ms.  Kovack is a Jehovah's Witness.  She works part time.  Stopped smoking 20  years ago.  Has a history of diabetes, AIDM type 2, of at least 8 years  duration with diabetic retinopathy and peripheral neuropathy, walking with a  cane because of left lower extremity neuropathy. She has a prior history of  chest pain and was hospitalized May 2006. Myocardial infarction was ruled  out, clinical congestive heart failure was present and treated; responding  well; and a Cardiolite stress test was negative for ischemia. However,  ejection fraction was 37%. Follow-up 2-D echo showed an ejection  fraction of  greater than 55% and the patient was treated medically. She has been on  medical therapy for diabetes, hypertension, hypothyroidism. She has also  been followed by Dr. Darrick Penna because of creatinines in the 15 range,  presumably associated with her diabetes.   She was admitted April 22, 2005 with increasing edema and symptoms of  congestive heart failure. Several days prior to that, she had 1-2 days of  dull, aching, substernal pressure, but did not seek medical attention. On  admission CPK was elevated to 481 but MB was normal at 2.8; and Troponin was  elevated to 0.09. SGOT was elevated and she had mild anemia with elevated  LDLs of 129 and cholesterol of 201. BUN/creatinine 28/0.8 and stable. In  this setting. It was felt best to proceed with coronary angiography; and  this was done by Dr. Aleen Campi through the right femoral approach on April 24, 2005. He found a subtotal mid-99% complex LAD lesion with slow antegrade  flow. A 90% proximal right coronary lesion, 60% mid right coronary lesion,  no significant circ disease and marked anteroseptal and apical hypokinesis,  compatible with recent infarction, and paradoxical bulge. Overall EF  approximately 35%.  She tolerated this procedure well and was subsequently  referred to [place] to help with PCI in this setting.   Informed consent was obtained from the patient to proceed with __________  seen and evaluated by me. Cine angiograms were reviewed. We felt that the  culprit lesion was probably her LAD mid lesion, but she had associated mid-  distal and proximal stenoses as well and high-grade right coronary. In view  of her past mild renal insufficiency and diabetes; diuretics were held ARB  was held;  she was hydrated preoperatively; and brought to the second floor  CP lab in the post absorptive state. The right groin looked quite good with  no ecchymosis, but she had tenderness so the left groin was addressed. The  left  common femoral artery was entered with a 18 thin-wall Smart needle, and  a 6-French short side-arm sheath was inserted without difficulty. Guidewire  exchange was used throughout procedure. She was given 600 mg of Plavix p.o.  50 mcg of fentanyl, 20 mg of IV Pepcid, and 2 mg of Versed throughout the  procedure. Omnipaque dye was used. A Foley was in place; and there was good  urine output of approximately 550 mL during the procedure.   The left coronary was intubated with a JL-4, 6-French, Cordis guiding  catheter. Scout injections were obtained. We attempted to limit dye use;  however, because of the complex nature of this procedure, she received a  total of approximately 150 to 200 mL of Visipaque dye. IC nitroglycerin was  administered, weight adjusted heparin, and EKGs were monitored.  She was  given Aggrastat double bolus plus infusion Plavix 600 mg.  The LAD showed a  moderately large first diagonal with no significant stenosis. Beyond this  there was a bend and some irregularity with about 50-60% narrowing  angiographically. There was a high grade 95% subtotal ulcerative mid-LAD  lesion beyond a large bifurcating normal-appearing DX-2 and just beyond a  large septal perforator branch. There was another 70% concentric lesion  before the DX-3 arising from the mid-LAD, and this bifurcated and had no  significant stenosis. There was 40% narrowing in the remainder of the LAD.  There was slow TIMI 2 flow to the distal LAD this was unchanged from prior  angiogram.   The lesions were crossed with an a 0.014 inch of soft guidewire which was  positioned in the distal LAD. The A2 0.5/12 Voyager Guidant balloon was then  passed across the high-grade mid stenosis which was dilated at 05-57 for  predilatation. The balloon was then exchanged for a 2.5/13 Cypher. This was  positioned across the mid-distal lesion proximal to the second diagonal and deployed at 12-43 postdilated 16-37.   We then  exchanged the balloon for a 3.0/13 DES Cordis Cypher stent. This was  positioned just beyond the second diagonal branch covering the high-grade  lesion and just overlapping the previously placed stent beyond it. It was  deployed at 14-40. The overlap was dilated at 8-30 at 14-37. There was  excellent tapering of the stents, the overlapping stents, with minus 10%  residual narrowing and no dissection and restoration TIMI 3 flow; however,  angiography showed that there was disruption of the proximal LAD beyond the  DX-1 in the area of prior bend 60-70% narrowing or less initially. This  appeared to have a local dissection. It was felt best to cover this. This  area was then covered primarily with a 3.0/8 Cypher stent which was  positioned fluoroscopically, deployed just beyond the DX-1, at 8-33 and  postdilated at 10-10. The balloon was removed and final injection showed  excellent angiographic result. The proximal LAD reduced to 0% the mid and  mid-distal LAD, overlapping LAD stents, reduced to minus 10% with good TIMI  3 flow. DX-1, DX-2 and SV were intact.   We then took a picture of the right coronary artery through a right coronary  6-French guide using exchange technique. There was a high grade 90% proximal  segmental right coronary stenosis before the RV marginal branch. There was  another probably 60% narrowing of the RCA beyond the RV marginal branch.  The bifurcating PLA and PDA were widely patent and there was good flow  throughout.   Because of the complexity of the LAD, PTCI, in view of this patient's  diabetes and past history of mild renal insufficiency, and dye  administration; it was felt best to stage the RCA intervention. This was  discussed with the patient and she was agreeable to this.   We plan to continue aspirin and Plavix, beta blocker.  I will hold the ARB  and ACE inhibitors as present, and be careful with diuretic therapy.  Hopefully she will develop some  recovery of her anterolateral wall with  presumed out of hospital recent a WMI with her culprit lesion LAD stented as  outlined above. If her renal function remains stable we will do a planned  staged PCI of the RCA.   DIAGNOSES:  1.  AFHB. Status post possible [facility] May 2006 with uncontrolled diabetes,      hypertension, and reduced ejection fraction on Cardiolite to 37%.  2.  History of chest tightness, several days prior to admission.  Admitted      with recurrent congestive heart failure after prior followup of 2-D echo      showing EF greater than 55%.  3.  Severe 2-vessel coronary disease with subtotal LAD, and intraapical-      hypoakinesis.  Out of hospital AWMI on diagnostic catheterization by Dr.      Aleen Campi April 24, 2005.  Seven successful PCI with tandem mi LAD      stenting and proximal LAD Cypher stenting as outlined above.  4.  AODM.  5.  Remote smoker.  6.  Fibromyalgia. 7.  Hypothyroidism on therapy.  8.  Status post cholecystectomy in the past.  9.  Chronic, mild renal insufficiency under the care of Dr. Darrick Penna.  10. Systemic hypertension.   At her next angiography I would recommend that she be considered for  abdominal angiogram to assess her for atherosclerotic renal artery disease  as well.      Richard A. Alanda Amass, M.D.  Electronically Signed     RAW/MEDQ  D:  04/25/2005  T:  04/27/2005  Job:  [id number]   cc:   Antionette Char, MD  Fax: [phone]   Juline Patch, M.D.  Fax: [phone]   Llana Aliment. Deterding, M.D.  Fax: [phone]   Elliot Cousin, M.D.

## 2010-10-11 NOTE — Cardiovascular Report (Signed)
NAMEKHANIYAH, [name]                ACCOUNT NO.:  [id number]   MEDICAL RECORD NO.:  [id number]          PATIENT TYPE:  INP   LOCATION:  4708                         FACILITY:  [facility]   PHYSICIAN:  Antionette Char, MD    DATE OF BIRTH:  [birth year]   DATE OF PROCEDURE:  04/24/2005  DATE OF DISCHARGE:                              CARDIAC CATHETERIZATION   PRIMARY PHYSICIAN:  Dr. Juline Patch. Procedure done by Dr. Aleen Campi.   PROCEDURES.:  1.  Left heart catheterization.  2.  Coronary cineangiography.  3.  Left internal mammary artery cineangiography.  4.  Left ventricular cineangiography biplane.  5.  Right groin femoral artery cine.   INDICATION FOR PROCEDURES:  This 65 year old female was scheduled for  cardiac cath after presentation with recent onset of congestive heart  failure. She has a history of chest pain and mild renal insufficiency and  was admitted to [facility] in May of 2006 for chest pain. A heart  attack was ruled out to and a Persantine-Cardiolite study done at that time  was negative for ischemia. However, it did show and an ejection fraction  estimated at 37%. A follow-up 2-D echocardiogram here at [facility]  showed preserved left ventricular function with an ejection fraction ranging  between 55-65%. She has been followed by Dr. Darrick Penna for her renal  insufficiency and her BUN and creatinine on admission were 27 and 0.8  respectively.   PROCEDURE:  After signing an informed consent, the patient was premedicated  with 5 milligrams of Valium by mouth. After having a mild saline infusion  and premedicated with Mucomyst several times prior to the cath for  protection of her kidneys, she was brought to the cardiac cath lab where her  right groin was prepped and draped in sterile fashion and anesthetized  locally with 1% lidocaine. A 6-French introducer sheath was inserted  percutaneously into the right femoral artery. A 6-French #4 Judkins  coronary  catheters were used to make injections into the native coronary arteries.  The right coronary catheter was used to make a mid-stream injection into the  left subclavian artery visualizing the left internal mammary artery. A 6-  French pigtail catheter was used to measure pressures in the left ventricle  and aorta and to make mid-stream injections into the left ventricle. A  pullback across the aortic valve was recorded. The patient tolerated the  procedure well and no complications were noted at the end of the procedure.  A small contrast injection was made into the right femoral artery showing  that the initial puncture site was in the profundus; therefore, a closure  device was not used. Hemostasis was easily obtained using routine pressure.  After removal of the 6-French sheath, the patient was then monitored in the  holding area prior to returning to her telemetry bed on 4700. Further  medications used during the procedure none.   CINE FINDINGS CORONARY ARTERY CINEANGIOGRAPHY:  1.  Left coronary artery:  The ostium and the left main appear normal.  2.  Left anterior descending:  The LAD has diffuse  plaque throughout its      proximal segment causing mild narrowing of 20-30%. Following the second      anterolateral branch, there is a critical stenosis of at least 99% with      very slow antegrade flow following the lesion and inadequate flow      distally. The diagonal and septal branches are normal in appearance.  3.  The circumflex coronary artery:  The circumflex has a mild less than 20%      stenotic lesion in its proximal segment. The distal circumflex including      a large obtuse marginal branch appear normal.  4.  Right coronary artery: The right coronary artery is a large dominant      vessel supplying the posterior descending and posterolateral circulation      to the left ventricle. The ostium appears normal. There is a severe      focal eccentric lesion in the  proximal segment with an 80-90% stenosis.      This is followed by a focal 50-60% lesion just distal to the right      ventricular branch. There is a minor lesion at the acute angle. The      distal segment including the crux, posterior descending and posterior      lateral branches all appear normal with fairly good antegrade flow      through the proximal lesion.  5.  Left internal mammary artery appears normal.  6.  Left ventricular cineangiogram:  The left ventricular chamber size is      normal in appearance. The left ventricle wall thickness appears normal.      The left ventricular contractility is markedly abnormal with a large      aneurysm which comprises the septal, apical and anterior segments. The      inferior, posterior and antral basilar segments of the left ventricle      have hyperdynamic function. Therefore, the overall left ventricular      contractility is approximately 35%. There was no gradient across the      aortic valve. The mitral valve appears normal without evidence of      insufficiency.  7.  Right femoral artery:  Injection into the right femoral artery showed      minor irregularities in the femoral and superficial femoral artery and      profundus was a large vessel and appeared normal. The puncture site was      in the profundus branch which appeared to be in the segment which was      superficial at the point of entry. This appeared normal.   FINAL DIAGNOSES:  1.  Two-vessel coronary artery disease with 99% mid-left anterior descending      artery lesion with very slow antegrade flow and 80-90% proximal right      coronary artery lesion with good antegrade flow.  2.  Normal left internal mammary artery.  3.  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 Overall ejection fraction 35%. 4.  Normal femoral and profundus  arteries.   DISPOSITION:  We will consider coronary artery bypass graft surgery versus  angioplasty. She received a total of 57 cc of contrast today and we will  continue her on a saline infusion and also continue the Mucomyst for renal  protection. We will recheck her  BMET in the a.m. Further disposition will  also consider her response to the contrast given today on her kidneys.      Antionette Char, MD  Electronically Signed     JRT/MEDQ  D:  04/24/2005  T:  04/24/2005  Job:  [id number]   cc:   Juline Patch, M.D.  Fax: [phone]   Catheter Lab at [facility]

## 2010-10-11 NOTE — Consult Note (Signed)
[name], [name]                ACCOUNT NO.:  [id number]   MEDICAL RECORD NO.:  [id number]          PATIENT TYPE:  INP   LOCATION:  0340                         [facility]ACILITY[facility]  [facility]   PHYSICIAN:  Jaclyn Prime. Lucas Mallow, M.D.   DATE OF BIRTH:  1945-12-21   DATE OF CONSULTATION:  DATE OF DISCHARGE:                                   CONSULTATION   DATE OF CONSULTATION:  Oct 15, 2004.   This 65 year old woman is see in consultation regarding her chest discomfort  and the appropriateness of stress testing, at the request of her hospital  physician, Dr. Miachel Roux L. Robb Matar.  The patient has been a patient of Dr.  Juline Patch in the past but has not seen him in more than 2 years according  to our office records, apparently involving her choice not to take  recommended medications.   She gives a history of developing discomfort above her left breast at work  last Friday.  She says that the pain continued over the entire course of the  day and into the next morning.  She had left work for that.  The peak pain  was described as 9.5 to 10 out of 10.  She said she may have had some  nausea.  She had discomfort with palpation of the area over her left breast,  into her left axilla where the pain was, and that discomfort continued after  the pain resolved as far as spontaneous pain was concerned.  She denies any  dyspnea, palpitations or other chest pain related to this discomfort.  There  was no change in the pain with activity or moving around or moving her arm.  She denies having done unusual work or physical exertion with her left arm  or hand prior to the onset of the chest discomfort.  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  She does not lift heavy objects.   PAST MEDICAL HISTORY:  Includes fibromyalgia, childhood asthma, regarding  which she has been free from any attacks since the age of 5, diabetes  mellitus with as noted very limited medical therapy at her own volition over  the  last 2 years, past total abdominal hysterectomy and cholecystectomy.  She notes that she has lost 130 pounds in the last 5 years and 17 pounds in  the last 5 months.   FAMILY HISTORY:  Her mother, now 24, has a history of hypertension and  diabetes.  Her father's health history is unknown.  A maternal uncle had a  triple bypass in his 27s.  A maternal grandfather died at 58 of diabetes  mellitus and angina.   REVIEW OF SYSTEMS:  CONSTITUTIONAL:  She denies fevers, chills or sweats.  She had no diaphoresis associated with the discomfort which occasioned her  admission.  She said she has had swelling in her ankles since childhood and  it is worse in the summer.  She denies claudication.  EYES:  She wears  glasses.  She denies diplopia or blurring.  ENT:  No deafness or dizziness.  She has her  own teeth.  CARDIOVASCULAR:  See the history of present illness.  RESPIRATORY:  She has smoked three packs a day from the age of 30 until she  quit 30 years ago.  She has not smoked since.  She denies wheezing.  GI:  There was a question of a single episode of nausea associated with the chest  discomfort above.  GU:  No dysuria or pyuria.  MUSCULOSKELETAL:  She has a  history of arthritis in both knees and fibromyalgia, for which she  occasionally takes Tylenol which is of limited help.  SKIN AND BREASTS:  She  has a rash on her back with some scarring.  NEUROLOGIC:  No faintness or  syncope.  PSYCHIATRIC:  No overt hallucinations or depression.  ENDOCRINE:  See the history of present illness and the past history regarding the  diabetes.  It is of note that her hemoglobin A1c on admission was 11.4.  HEME/LYMPH:  No swelling in the neck, axilla or groin.  ALLERGIC/LYMPHATIC:  She was intolerant of GLUCOPHAGE which caused her stomach discomfort, and  that discomfort was the reason why she stopped it.  She is also intolerant  of SHRIMP.  All remaining systems on a comprehensive 14-system review are   negative.   PHYSICAL EXAMINATION:  VITAL SIGNS:  Blood pressure initially 190/102, now  160/80.  Respirations 20 and unlabored, pulse 70 and regular.  GENERAL APPEARANCE:  She is well developed and well nourished, still  moderately overweight, a white middle-aged woman in no acute distress.  She  is oriented to person, place and time.  Her mood and affect are pleasant.  HEENT:  Her conjunctivae and lids reveal no __________, no icterus or arcus  senilis.  She has her own teeth, which are in good repair.  The oral mucosa  reveals no pallor or cyanosis.  NECK:  Supple and symmetrical.  The trachea is midline and mobile.  There is  no palpable thyromegaly or cervical nodes.  No carotid bruit or JVD.  RESPIRATORY:  Her respiratory effort is normal.  Her lungs are clear to  auscultation and percussion.  BACK:  Straight and nontender.  MUSCULOSKELETAL:  Her gait is not tested.  She probably could undergo a  fairly good stress test if appropriate.  Muscle strength and tone are  generally normal.  CARDIOVASCULAR:  The cardiac apical impulse is cryptic.  The left border  cardiac dullness is within the left anterior axillary line.  The heart  rhythm is regular, and the rate is normal.  There is no gallop, click or  murmur.  EXTREMITIES:  Her digits and nails revealed no clubbing or cyanosis.  The  femoral arteries are without bruits.  The pedal pulses are intact.  Two plus  posterior tibial and dorsalis pedis in each foot.  Her legs reveal one plus  edema.  SKIN:  The skin and subcutaneous tissue reveal extensive scarring over her  back, presumably from acne.  ABDOMEN:  Mildly protuberant and nontender.  There is no palpable  enlargement of the liver or spleen.  The abdominal aorta is without bruit.   The available laboratory data include three sets of cardiac enzymes, which  are all negative.  Only a second EKG appears to have been done.  It shows sinus rhythm with a nonspecific T wave  abnormality.  An echocardiogram has  been done.  I was able to review it partly today, and she appears to have  normal left ventricular function, although the  technical quality is poor  because of poor sound transmission.   Additional laboratory data includes the fact that she apparently is frankly  nephrotic with a 24-hour urine protein of 9950 mg.  Her creatinine  clearance, however, is normal.  Her blood sugar on admission to the hospital  was 510, and her hemoglobin A1c is 11.9.   This is a 65 year old woman who is at significant risk for coronary artery  disease because of diabetes.  Her chest discomfort was almost certainly  noncardiac.  The single piece of evidence we lack which would have helped [place]  think about this further would have been serial EKGs, which even in the age  of serial enzymes have some value.   I agree that because of her pretest probability of coronary artery disease,  simply by virtue of having uncontrolled diabetes mellitus, cardiac perfusion  imaging is appropriate.  Given her diabetes and renal problems, I would be  very reluctant to recommend cardiac catheterization unless the test is  interpreted as high risk.  Any lower levels, in view of the fact that she  is apparently asymptomatic as far as her heart is concerned, would not  justify the risks of cardiac catheterization, at least at this point I  think.   I appreciate the opportunity to participate in the care of this nice woman.  She certainly needs to have emphasized to her the fact that rigorous control  of her diabetes and blood pressure in the future will be an absolute  necessity if she is to live anything resembling a normal life span.      DDG/MEDQ  D:  10/15/2004  T:  10/15/2004  Job:  [id number]   cc:   Miachel Roux L. Robb Matar, M.D.   Juline Patch, M.D.  [address]  [place], [place] [address]  Fax: [phone]

## 2010-10-11 NOTE — H&P (Signed)
NAMEFRANCESA, [name]                ACCOUNT NO.:  [id number]   MEDICAL RECORD NO.:  [id number]          PATIENT TYPE:  EMS   LOCATION:  ED                           [facility]AC[facility]  [facility]   PHYSICIAN:  Michaelyn Barter, M.D. DATE OF BIRTH:  1946/04/30   DATE OF ADMISSION:  10/11/2004  DATE OF DISCHARGE:                                HISTORY & PHYSICAL   PRIMARY CARE PHYSICIAN:  Dr. Juline Patch.   CHIEF COMPLAINT:  Chest pain.   [name] is a 65 year old female with a past medical history of diabetes  mellitus and fibromyalgia who states that this morning at approximately 8:00  a.m., she developed chest pain over her left chest area.  The pain traveled  into the left axilla and into the left upper arm.  There was no radiation  into the neck.  She also complains of some slight radiation to her back.  The intensity of the pain increased throughout the course of the day up  until 4:30 p.m. while she was at work.  It peaked at 4:30, and then the  intensity began to decrease.  The pain was described as 9.5 to 10/10.  Therefore, she decided to leave work and come to the emergency room for  further evaluation.  She also experienced some nausea during her chest pain.  Initially, she attributed the pain to fibromyalgia.  She took two aspirin  earlier.  She denies having any similar pain previously.  She denies any  orthopnea, but she does sleep on four pillow, but she does state that this  is secondary to her history of asthma and has been doing so for many years.  No PND.  Her chest pain is currently resolved; however, she states that she  can reproduce it with palpation.   PAST MEDICAL HISTORY:  1.  Fibromyalgia.  2.  Asthma.  Her last attack was 30 years ago.  3.  Diabetes mellitus type 2.  4.  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   PAST SURGICAL HISTORY:  1.  Total abdominal hysterectomy secondary to fibroid tumors.  2.   Cholecystectomy.   ALLERGIES:  SHRIMP.   HOME MEDICATIONS:  1.  Motrin with codeine.  2.  Tylenol.  3.  The patient stopped Glucophage approximately one year ago secondary to      it causing stomach pain.  She has not taken any medication for her      diabetes mellitus since then.   SOCIAL HISTORY:  1.  Cigarettes:  The patient stopped smoking 20 years ago.  She started      smoking at the age of 44.  She smoked three packs of cigarettes per day.  2.  Alcohol:  Occasionally.  3.  Street drugs:  Patient denies.   FAMILY HISTORY:  Mother has history of hypertension and diabetes mellitus.  Father's medical history is unknown.  Maternal uncle had a triple bypass.  Maternal grandmother has a history of angina and diabetes mellitus.   REVIEW OF SYSTEMS:  As per HPI, otherwise  all other systems are negative.  GENERAL:  The patient is cooperative.  There is no tachypnea or other signs  of distress.   PHYSICAL EXAMINATION:  VITAL SIGNS:  When the patient initially presented to  the emergency room, her vitals were recorded as temperature 97.3, blood  pressure 192/102, heart rate 89, respirations 20, O2 sat 98%.  HEENT:  Anicteric.  Extraocular movements are intact.  Pupils are equal and  reactive to light.  No oral thrush.  NECK:  Supple.  No lymphadenopathy.  Thyroid is not palpable.  Strong  carotid upstrokes bilaterally.  No carotid bruits auscultated.  RESPIRATORY:  No crackles.  No wheezes.  Lungs are clear.  HEART:  S1 and S2 is present.  Regular rate and rhythm.  No S3.  No S4.  No  murmurs.  No gallops.  No rubs.  ABDOMEN:  Soft, nontender, nondistended.  Positive bowel sounds.  No  hepatosplenomegaly.  EXTREMITIES:  Left leg has a decreased range of motion.  Right leg is 5/5.  Strength in both arms is 5/5.  There is trace bilaterally distal edema.  NEUROLOGIC:  The patient is alert and oriented x 3.  Cranial nerves II-XII  are intact.   LABS:  Hemoglobin 12.9, hematocrit  37.1.  White blood cell count 6.5,  platelets 262.  D-dimer is 0.29.  Sodium 133, potassium 4.1, chloride 95,  CO2 29, BUN 21, creatinine 0.8, glucose 510, myoglobin/POC 149, troponin  I/POC less than 0.05, CK-MB/POC 1.  Calcium 8.7.   CHEST X-RAY:  Mild peribronchial thickening.  No focal air space disease.   EKG:  Normal sinus rhythm.  Good R wave progression.  Transition is achieved  by lead V3.  Small T wave inversions in V5-6.   ASSESSMENT/PLAN:  1.  Chest pain:  Etiology:  Cardiac versus noncardiac.  Will rule out the      patient for cardiac event by checking troponin I and CK-MB x3 q.8h.      apart.  Will also order a 2D echocardiogram to evaluate the patient's      overall ejection fraction.  Will start the patient on aspirin 325 mg      daily.  Will provide oxygen on a p.r.n. basis, nitroglycerin as needed,      and then also morphine as needed on a p.r.n. basis.  2.  Uncontrolled hypertension:  The uncontrolled hypertension may have      precipitated the patient's chest pain.  The patient reported no previous      history of hypertension but again, the patient's blood pressure was      significantly elevated at the time of her admission.  Likewise, it has      been elevated throughout the time that she has been in the emergency      room.  Therefore, will start the patient on Lopressor 25 mg p.o. b.i.d.      and given the fact that the patient has a history of diabetes mellitus,      will start lisinopril and ACE inhibitor 10 mg p.o. daily.  Will titrate      the dosages of both of these medications up as needed.  3.  Uncontrolled diabetes mellitus:  This is secondary to the patient's      noncompliance with her home regimen diabetic medication.  Will perform      Accu-Cheks for now.  In addition, will check a hemoglobin A1C with a.m.      labs and  a fasting lipid profile.  4.  History of fibromyalgia:  The patient states that she has a reproducible     component to her  chest pain; therefore, there could be a musculoskeletal      element to her chest pain.  Will treat the patient on a p.r.n. basis for      her fibromyalgia currently.  5.  History of asthma:  Currently stable.  Will provide albuterol on a      p.r.n. basis.  6.  Gastrointestinal prophylaxis:  Will provide Protonix 40 mg daily.  7.  Deep venous thrombosis prophylaxis:  Will provide Lovenox 40 mg subcu      q.24h.      OR/MEDQ  D:  10/11/2004  T:  10/11/2004  Job:  [id number]

## 2010-10-11 NOTE — H&P (Signed)
NAME:  [name], [name]               ACCOUNT NO.:  [id number]   MEDICAL RECORD NO.:  [id number]            PATIENT TYPE:   LOCATION:                                 FACILITY:   PHYSICIAN:  Madaline Savage, M.D.     DATE OF BIRTH:   DATE OF ADMISSION:  DATE OF DISCHARGE:                                HISTORY & PHYSICAL   HISTORY OF PRESENT ILLNESS:  [name] is a 65 year old female who is  admitted now for elective angioplasty.  She is a patient of Dr. Adelene Idler.  She initially had been evaluated by Dr. Aleen Campi in May of 2006 with chest  pain and had a Cardiolite study that was unremarkable.  She was re-admitted  April 22, 2005 with CHF.  Catheterization was done by Dr. Aleen Campi on  April 24, 2005.  This revealed 2-vessel disease with a 99% mid LAD  stenosis and a 90% proximal RCA.  She had marked anterior septal and apical  aneurysm with an EF of 35%.  She is a Scientist, product/process development.  It was decided to  proceed with elective intervention.  This was done by Dr. Alanda Amass to the  LAD with 3 Cypher stents April 25, 2005.  She was supposed to have a stage  intervention to her RCA.  The catheterization was done through the right  groin and the intervention through the left.  She developed left groin  oozing and hematoma post procedure.  As she is a TEFL teacher Witness she was  not transfused.  Her hemoglobin dropped to 7.6.  It was decided to treat her  medically for now and bring her back later for elective RCA intervention.  The patient is admitted now for this.  She has occasional chest pain but  does not use nitroglycerine.  She saw Dr. Aleen Campi in the office recently  and her hemoglobin apparently was over 10.5 so she is admitted now for  elective RCA intervention.   CURRENT MEDICATIONS:  1.  Lantus 40 units subcutaneous daily.  2.  Potassium 20 mEq b.i.d.  3.  Zocor 40 mg a day.  4.  Micardis 80 mg b.i.d.  5.  Protonix 40 mg b.i.d.  6.  Toprol XL 25 mg b.i.d.  7.  Lasix  40 mg a day.  8.  Synthroid 0.075 mg a day.  9.  Plavix 75 mg a day.  10. Aspirin 81 mg a day.   ALLERGIES:  No known drug allergies.   PAST MEDICAL HISTORY:  Remarkable for hypertension, fibromyalgia and  dyslipidemia.  She is followed by Dr. Darrick Penna for chronic renal  insufficiency.  She has insulin-dependent diabetes.  Her creatinine during  her hospitalization was stable at 0.8 to 1.0.  She has had a prior abdominal  hysterectomy and cholecystectomy.   SOCIAL HISTORY:  She is divorced.  She lives alone.  She has one son who was  killed in an accident.  She quit smoking 20 years ago.   FAMILY HISTORY:  Unremarkable for coronary disease.  She does have a family  history of diabetes and hypertension.  REVIEW OF SYSTEMS:  Essentially unremarkable except as noted above.   PHYSICAL EXAMINATION:  Blood pressure 130/67, pulse 68, respirations 20,  temperature 96.  IN GENERAL:  She is a well-nourished, well-developed female in no acute  distress.  HEENT:  Normocephalic and atraumatic. Extraocular movements intact.  Sclerae  are non-icteric.  NECK:  Without JVD, no bruit.  CHEST:  Clear to auscultation and percussion.  CARDIAC EXAM:  Regular rate and rhythm without murmur, rub or gallop, normal  S1, S2.  ABDOMEN:  Obese, nontender.  She has a right upper quadrant surgical scar.  EXTREMITIES:  Trace edema bilaterally.  No femoral artery hematoma is noted.  NEURO EXAM:  Grossly intact.  She is awake, alert and oriented and  cooperative.  Moves all extremities.   LABORATORY DATA:  Her labs are pending.  Her hemoglobin is 10.6.   IMPRESSION:  1.  Congestive heart failure on admission April 22, 2005, currently      stable.  2.  Coronary disease, 2-vessel, status post three site LAD Cypher stenting      on April 25, 2005, admitted now for elective RCA intervention.  3.  Anemia status post LAD intervention, improved.  4.  Jehovah's Witness, no blood transfusion or blood  products.  5.  Insulin-dependent diabetes.  6.  Hypertension.  7.  Dyslipidemia.  8.  History of renal insufficiency, the patient is followed by Dr. Darrick Penna      but her creatinine has been stable.  9.  Obesity.   PLAN:  The patient will be admitted today for elective RCA intervention.      Abelino Derrick, P.A.    ______________________________  Madaline Savage, M.D.    Lenard Lance  D:  06/05/2005  T:  06/05/2005  Job:  [id number]

## 2010-10-11 NOTE — H&P (Signed)
[name], Tina                ACCOUNT NO.:  [id number]   MEDICAL RECORD NO.:  [id number]          PATIENT TYPE:  INP   LOCATION:  4739                         FACILITY:  [facility]   PHYSICIAN:  Lonia Blood, M.D.      DATE OF BIRTH:  March 29, 1946   DATE OF ADMISSION:  08/07/2005  DATE OF DISCHARGE:                                HISTORY & PHYSICAL   PRIMARY CARE PHYSICIAN:  Juline Patch, M.D.   CARDIOLOGIST:  Antionette Char, M.D.   NEPHROLOGIST:  Dr. Darrick Penna.   PRESENTING COMPLAINT:  Dizziness for six days.   HISTORY OF PRESENT ILLNESS:  The patient is a 65 year old female with known  coronary artery disease status post cath and stent in December 2006 and  January 2007.  The patient also has chronic kidney disease for which she is  being followed by Dr. Darrick Penna.  She apparently recently had a problem with  her medications.  She is on some diuretics.  She went to see Dr. Ricki Miller, after  her diuretics had been changed by Dr. Aleen Campi.  She was previously on Lasix  with a lot of edema and that dose was not working well.  She subsequently  was asked to stop Lasix and take another type of medication which she is not  sure of.  Her kidney function has been great, according to previous records.  Her creatinine was for example 0.8 back in January 2007.  Today, however,  her creatinine has jumped to 1.4.  The patient reported her dizziness mainly  when she gets up from a lying position.  She is okay when she is lying down  but if she gets into standing or sitting she feels dizzy.  She describes it  as mainly dizzy and not vertigo.  Also, the patient denied any recent cold.  Her vision usually  changes whenever she is feeling dizzy, prior to feeling  like she is going to pass out.  In the ED, she was also found to be  orthostatic.   PAST MEDICAL HISTORY:  1.  Coronary artery disease, status post multiple Cardiac Catheterizations      and stents.  She recently had re-stenting by Dr.  Elsie Lincoln on June 05, 2005.  Her RCA received a CYPHER stent then.  She had the previous LAD      stent, on April 25, 2005, with some complication of left groin bleed      and anemia.  2.  The patient also has insulin-dependent diabetes, type 2.  3.  She has left ventricular dysfunction with CHF.  Her EF was estimated at      35 to 40%.  4.  History of hypertension.  5.  GERD.  6.  She has fibromyalgia.  7.  Hypothyroidism.  8.  Kidney disease of a sort.   ALLERGIES:  The patient has no known drug allergies.   SOCIAL HISTORY:  The patient is divorced and lives alone.  She has one child  that was killed in an accident.  She is currently not a smoker, no  alcohol  intake.  The patient is also a Jehovah's Witness and so she will not take  any blood.   FAMILY HISTORY:  Mainly diabetes and hypertension but no coronary artery  disease.   REVIEW OF SYSTEMS:  A 10-point review of systems as performed today,  essentially as in HPI.   MEDICATIONS:  The patient is not sure of all her current medications but  reportedly takes,  1.  Lantus 40 units subcutaneously every night.  2.  Potassium 40 mEq b.i.d.  3.  Zocor 40 mg daily for dyslipidemia.  4.  Micardis 80 mg twice a day.  5.  Protonix 40 mg.  6.  Nexium 40 mg daily.  7.  Plavix 75 mg daily.  8.  Ultram, Darvocet, and aspirin 81 mg daily.  We will obtain the patient's medication record from Dr. Lynne Logan office.   PHYSICAL EXAMINATION:  VITAL SIGNS:  Temperature 98.7, initial blood  pressure was 125/76 with a pulse of 99, respiratory rate of 20, and sat is  98% on room air.  Her orthostatic blood pressure 130/53 with a pulse of 82  while lying.  It was 130/39 with a pulse of 83 while sitting.  And, 78/38  with a pulse of 72 while standing.  GENERAL:  The patient looks weak.  She is alert and oriented however and  communicating adequately.  HEENT:  Her pupils are equal and reactive to light.  No facial droop.  NECK:  Supple.   No JVD.  No lymphadenopathy.  RESPIRATORY:  She has good air entry bilaterally.  No wheezes or rales.  CARDIOVASCULAR:  She has regular rate and rhythm.  No audible murmurs.  ABDOMEN:  Soft and nontender with positive bowel sounds.  EXTREMITIES:  No edema.  No cyanosis or clubbing.   LABORATORY:  Today mainly showed sodium 131, her potassium was 5.2, chloride  103, BUN 126, glucose 106.  Her creatinine id 1.4.  Her EKG showed a normal  sinus rhythm with no ST-T wave changes.  Of note, her T waves have flipped  from previous admission, in January, where her T waves were inverted.   ASSESSMENT:  This is a 65 year old female presenting with some form of  dizziness which appears to be secondary to dehydration.  The patient is  orthostatic.  She has markedly elevated BUN, compared with her previous BUN  of 39 in January.  She also has hemoglobin 11.1, it showed mild anemia but  presumably the patient is having dizziness not vertigo.  The most likely  cause of the dizziness, especially with the orthostatic numbers given above,  is that the patient is having dizziness secondary to taking the diuretic.  It is not clear what diuretic she was taking but she said she was on high  dose Lasix which was switched to this diuretic.  She was initially edematous  but right now she is dry.  Other differential for what is going on could be  some autonomic dysfunction from her diabetes.  The patient could also have  inner ear disease.  She could have a cerebrovascular accident since with a  history of coronary artery disease she is at risk for a stroke as well.  However, her neuro exam is completely normal at this point.   PLAN:  1.  Severe dizziness.  The patient has a low EF, so we will be careful in      terms of fluid resuscitation.  However with her BUN and creatinine  elevated, I am pretty sure that this is secondary to diuretics.  We will      hold the diuretics and give her some fluids  cautiously.  Once the      patient reaches a point where it looks like she is getting fluid      overloaded, we will stop the fluids.  At this point, I will give her up      to 2 liters initially and then we will reassess the patient at that      point.  If she needs further fluids, we will give it to her.  I will      give her p.r.n. meclizine and observe her on tele bed in the hospital to      watch for any possible changes.  Since this could be due to arrhythmia      or any cardiac involvement, I will go ahead and check cardiac enzymes      and also monitor her on the tele.  As mentioned also, we will hold on      the diuretics for now.  2.  Diabetes.  The patient seems to have acceptable CBG at least at this      point.  We will continue with her home dose insulin and then add sliding      scale insulin.  3.  CHF.  As indicated, the patient seems to be compensated right now.  So,      we will continue with her home medications except for the diuretics.  4.  Hypertension.  The patient's blood pressure is okay at this point, so no      further changes are necessary.  5.  GERD.  We will continue with her PPI per home dose.  6.  Hypothyroidism.  I will also continue with her Synthroid at this point.  7.  Dyslipidemia.  We will keep the patient on Zocor while she is in the      hospital.   Other medical problems see to be stable.  If anything cardiac shows up, we  will re-consult Dr. Aleen Campi.  We will also try and get her medication list  from the office and communicate her current medication situation to both her  primary care physician and her cardiologist as well as her nephrologist  prior to discharge.      Lonia Blood, M.D.  Electronically Signed     LG/MEDQ  D:  08/07/2005  T:  08/08/2005  Job:  [id number]

## 2010-10-11 NOTE — H&P (Signed)
NAMESAMIKSHA, [name]                ACCOUNT NO.:  [id number]   MEDICAL RECORD NO.:  [id number]          PATIENT TYPE:  INP   LOCATION:  4729                         FACILITY:  [facility]   PHYSICIAN:  Elliot Cousin, M.D.    DATE OF BIRTH:  24-Nov-1945   DATE OF ADMISSION:  04/22/2005  DATE OF DISCHARGE:                                HISTORY & PHYSICAL   PRIMARY CARE PHYSICIAN:  Dr. Juline Patch.   CARDIOLOGIST:  Dr. Charolette Child.   CHIEF COMPLAINT:  Shortness of breath, orthopnea and bilateral leg edema.   HISTORY OF PRESENT ILLNESS:  [name] is a 65 year old lady with a past  medical history significant for type 2 diabetes mellitus, chronic kidney  disease, and hypertension, who presents to the hospital as a direct  admission from Dr. Lynne Logan office. Over the past week, the patient has had a  progressive increase in swelling of her legs bilaterally. She also complains  of shortness of breath, particularly with activity and when lying flat. The  patient was unable to sleep last night secondary to orthopnea. The patient  has some chest tightness particularly when she lies flat. No associated  radiation of the tightness, no associated diaphoresis, and no associated  nausea.  She informed her nephrologist, Dr.  Fayrene Fearing Deterding, about the swelling in her legs. Dr. Darrick Penna prescribed a  medication (the patient does not recall the name of it) to be taken one pill  every other day. However when the medication did not appear effective, Dr.  Darrick Penna called in Lasix 80 mg b.i.d. yesterday. The patient did have an  increase in urination, however the shortness of breath and the leg swelling  did not resolve.   The patient has a history of chest pain. She was admitted to the hospital in  May, 2006 secondary to chest pain. A myocardial infarction was ruled out. A  Cardiolite stress-test was negative for ischemia. However, the ejection  fraction was estimated at 37%. A follow up 2D  echocardiogram revealed that  the patient had preserved LV function with an ejection fraction ranging  between 55 and 65%. Given the recent events over the past week, the patient  will be admitted for further evaluation and management.   PAST MEDICAL HISTORY:  1.  Hospitalization in May of 2006 secondary to chest pain, uncontrolled      diabetes mellitus, and uncontrolled hypertension.      1.  Cardiolite study in May of 2006 revealed no evidence of infarction          or ischemia, decreased septal and inferior wall motion with an          estimated ejection fraction of 37%.      2.  A 2D echocardiogram on Oct 14, 2004 revealed and ejection fraction          ranging between 55 and 65%, moderately increased left ventricular          wall thickness, and Doppler parameters were persistent with high          left ventricular filling pressure.  2.  Chronic kidney disease, baseline creatinine approximately 1.2.  Followed      by nephrologist, Dr. Darrick Penna.  3.  Type 2 diabetes mellitus, diagnosed 8 years ago. The patient has a      history of diabetic retinopathy status post bilateral laser eye surgery.      She also has a history of diabetic nephropathy.  4.  Hypertension.  5.  Asthma as a child.  6.  Fibromyalgia.  7.  Hypothyroidism.  8.  Status post cholecystectomy in the past.  9.  Status post total abdominal hysterectomy and bilateral salpingo-      oophorectomy secondary to fibroids in the past.  10. Jehovah's Witness. The patient does not want transfusion of any red      blood cell products.   1.  CT scan of the chest in September of 2006 (outpatient) revealed no      evidence for thoracic aortic aneurysm or dissection, no central      pulmonary emboli, no acute pulmonary findings, small amount of      pericardial fluid, unremarkable upper abdomen.   MEDICATIONS:  1.  Furosemide 80 mg b.i.d. (prescribed yesterday).  2.  Lantus Insulin 40 units SQ q.h.s.  3.  Darvocet N 100  one tablet q.12h p.r.n.  4.  Toprol XL 25 mg q.h.s.  5.  Synthroid 75 mcg daily.  6.  Promethazine 25 mg t.i.d. p.r.n.  7.  Micardis 80 mg two tablets q.h.s.   ALLERGIES:  No known drug allergies.   SOCIAL HISTORY:  The patient is divorced. She lives alone. She had one son  who was killed in an accident. She works part time. She is applying for SSI.  She stopped smoking 20 years ago. She denies alcohol and drug use. She still  drives.   FAMILY HISTORY:  Her mother is living, she has a history of diabetes  mellitus and hypertension. She is 62 years of age. The health of her father  is unknown.   REVIEW OF SYSTEMS:  The patient's review of systems is positive for mild  swelling in her legs, occasional arthritis pains, otherwise review of  systems is negative.   PHYSICAL EXAMINATION:  VITAL SIGNS:  Temperature 96.8, heart rate 74,  respiratory rate 20, blood pressure 150/102. Weight 227 pounds.  GENERAL:  The patient is a pleasant 65 year old lady who is currently  sitting up in bed in no acute distress.  HEENT:  Head is atraumatic, normocephalic. Pupils are equal, round and  reactive to light. Extraocular movements are intact. Conjunctivae are clear,  sclerae are white. Tympanic membranes are clear bilaterally. Oropharynx  reveals moist mucous membranes. No posterior exudates or erythema.  NECK:  Supple, no adenopathy, no thyromegaly, no bruit. The patient does  have only mild JVD.  LUNGS:  Breathing is currently non labored. Rare bibasilar crackles are  noted on auscultation. The patient has good breath sounds down to the bases.  HEART:  S1, S2 with a soft systolic murmur.  ABDOMEN:  Well-healed hypogastric vertical scar. Abdomen is obese, positive  bowel sounds, soft, nontender, nondistended, no hepatosplenomegaly. No  masses palpated.  RECTAL/GU:  Deferred.  EXTREMITIES:  The patient has 3+ pitting edema bilaterally of her lower  extremities. Mildly tender to palpation  globally bilaterally. Pedal pulses could not be  palpated secondary to the edema.  NEUROLOGIC:  The patient is alert and oriented x3. Cranial nerves II-XII are  intact. Strength is 5/5 throughout with exception of bilateral lower  extremities  which are mildly decreased in strength secondary to the edema.  Sensation is intact.   ADMISSION LABORATORIES:  Pending. Chest x-ray pending. EKG pending.   ASSESSMENT:  1.  Shortness of breath/orthopnea/bilateral lower extremity edema. These      features are consistent with congestive heart failure exacerbation. As      noted above, the patient's ejection fraction was estimated at 55 to 65%      per the 2D echocardiogram in May, 2006.  Findings were also significant for features consistent with probable  diastolic dysfunction.  1.  Chest pressure. The chest pressure is probably secondary to congestive      heart failure. The patient will be assessed for a myocardial infarction.      Of note, a Cardiolite stress-test performed in May of 2006 was negative      for ischemia. The patient may need a cardiac catheterization, however,      this decision will be deferred to cardiologist, Dr. Aleen Campi.  Dr. Aleen Campi will be consulted tomorrow.  1.  Type 2 diabetes mellitus. The patient has a history of poor control of      her diabetes mellitus. However of late, her capillary blood sugars have      been more controlled. She does have a history of diabetic nephropathy      and diabetic retinopathy.  2.  Chronic kidney disease, secondary to diabetic nephropathy. The patient      is currently being followed by nephrologist, Dr. Darrick Penna. Baseline      creatinine may be in the neighborhood of 1.2.  3.  Hypertension. The patient's blood pressure is mildly elevated currently.      She will be restarted on Toprol and Micardis.   PLAN:  1.  The patient has been admitted to a telemetry bed.  2.  Will check baseline labs in addition to cardiac enzymes, TSH,  EKG, and a      PA and lateral chest x-ray.  3.  Start prophylactic Lovenox. Sublingual nitroglycerin as needed. Continue      Toprol XL and Micardis. Will add an aspirin at 81 mg daily.  4.  Will start Lasix 80 mg IV q.8h. Follow strict I&Os and daily weights.  5.  Will also assess the patient's BNP, hemoglobin A1C, fasting lipid panel,      and 2D echocardiogram.  6.  Oxygen therapy.  7.  Consult cardiologist, Dr. Aleen Campi.      Elliot Cousin, M.D.  Electronically Signed     DF/MEDQ  D:  04/22/2005  T:  04/22/2005  Job:  [id number]   cc:   Juline Patch, M.D.  Fax: [phone]   Antionette Char, MD  Fax: [phone]

## 2010-10-11 NOTE — Cardiovascular Report (Signed)
[name], [name]                ACCOUNT NO.:  [id number]   MEDICAL RECORD NO.:  [id number]          PATIENT TYPE:  OIB   LOCATION:  6523                         FACILITY:  [facility]   PHYSICIAN:  Madaline Savage, M.D.DATE OF BIRTH:  05-04-1946   DATE OF PROCEDURE:  06/05/2005  DATE OF DISCHARGE:                              CARDIAC CATHETERIZATION   PROCEDURES PERFORMED:  1.  Selective left and right coronary angiography.  2.  Percutaneous coronary stenting of the proximal and mid-RCA with a single      stent.  3.  Percutaneous femoral AngioSeal placement with excellent hemostasis.   ENTRY SITE:  Right femoral.   DYE USED:  Omnipaque.   MEDICATIONS GIVEN:  Fentanyl for sedation, lidocaine locally in the right  groin, AngioSeal infused during the case as well as IV nitroglycerin for  blood pressure.   COMPLICATIONS:  None.   PATIENT PROFILE:  The patient is a very pleasant 65 year old hypertensive  diabetic Hispanic female with a history of chest tightness, edema and  orthopnea that led to cardiac catheterization in recent weeks, specifically  April 24, 2005. She was found to have LAD and RCA disease. Her LAD was  stented by Dr. Alanda Amass on April 25, 2005. This procedure today was a  staged procedure performed on June 05, 2005 to the right coronary artery  as an outpatient electively.   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 She also  was known to have an acute anterior wall myocardial infarction prior to  hospitalization, congestive heart failure with hypokinesia segments in the  LAD distribution with an ejection fraction of 35%. Today we address the  issue of proximal RCA stenosis and mid-RCA stenosis which was basically one  long lesion that was to be stented today.   PROCEDURE:  This was performed via right percutaneous femoral approach with  6-French catheters. I used a left Judkins #4 catheter to see the left  coronary artery and  I used a Judkins right guide catheter to study the right  coronary artery. That guide was and a Cordis 6-French XBR 3.5. The guidewire  used during the case was a Geologist, engineering.   Right LAD, left main and left circumflex coronary arteries looked  essentially unremarkable. There was a radio-opaque stent in the proximal  portion of the LAD and mid-LAD overlapping stents were widely patent with a  step-up and step-down into the vessel. No lesions were seen in the three  diagonals or in the circumflex itself.   Right coronary artery was 95% proximal just after the pulmonary conus branch  and approximately a 75% in the mid-RCA just beyond the acute marginal. The  distal RCA, PDA and PLA were all patent.   The guidewire used was a Forte wire which allowed me to measure the lesion  length which was a big help in deciding stent length. The guidewire used was  a Forte marker wire which was also used not only to measure the vessel but  also for coaxial placement with the stent. The stent chosen was a 3.0  x 33  Cypher stent that was deployed to 14 atmospheres two different times. I then  used a 3.25 Quantum Maverick balloon and did a distal inflation in the stent  and then a proximal inflation in the stent, both at 15 atmospheres of  pressure which would produce a lumen diameter of 3.31. There was an entire  area of smooth lumen throughout the stented portion of the vessel. No side  branches were sacrificed and TIMI III flow was preserved during the case.  The stent looked excellent with smooth borders and a tiny bit of step-up and  step-down.   FINAL DIAGNOSIS:  Successful right coronary artery stenting with proximal  right coronary artery 95% and mid-right coronary artery 75% both reduced to  0% residual with a long stent 33 mm in length postdilated with a Quantum  Maverick balloon to 3.31 anticipated inner lumen diameter.   PLAN:  The patient had Angiomax during the case and AngioSeal.  The  angiograms was stopped after the case. AngioSeal closure was excellent. She  will be followed in the overnight in the 6500 unit at [facility]  and be a candidate for discharge tomorrow. The patient will follow up with  Dr. Juline Patch, her primary care physician, and also with Dr. Aram Candela.  Tysinger, her primary cardiologist.           ______________________________  Madaline Savage, M.D.     WHG/MEDQ  D:  06/05/2005  T:  06/06/2005  Job:  [id number]   cc:   Tina Char, MD  Fax: [phone]   Cardiac Catheter Lab at [facility]

## 2010-10-11 NOTE — Discharge Summary (Signed)
[name], ROACH                ACCOUNT NO.:  [id number]   MEDICAL RECORD NO.:  [id number]          PATIENT TYPE:  INP   LOCATION:  4737                         FACILITY:  [facility]   PHYSICIAN:  Jonna L. Robb Matar, M.D.DATE OF BIRTH:  April 18, 1946   DATE OF ADMISSION:  04/22/2005  DATE OF DISCHARGE:  05/01/2005                                 DISCHARGE SUMMARY   CODE STATUS:  Full.   ALLERGIES:  None.   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   FINAL DIAGNOSIS:  1.  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  2.  Congestive heart failure with hypokinetic segments, ejection fraction      35%.  3.  Acute anterior wall myocardial infarction just prior to hospitalization.  4.  Type 2 diabetes, diabetic nephropathy, diabetic retinopathy.  5.  Benign hypertension.  6.  Hypothyroidism.  7.  Anemia secondary to blood loss.  8.  Fibromyalgia.   PROCEDURES:  1.  Cardiac catheterization done November 30.  2.  December 1, catheterization, angiography, percutaneous transluminal      coronary angiography and triple tandem stenting in the left anterior      descending.   HISTORY:  This 65 year old hypertensive and diabetic white female had a one  week history of edema, orthopnea, and chest tightness the week before  admission.  In May 2006, she had had a previous episode of chest pain with a  negative Cardiolite stress test.   PHYSICAL EXAMINATION:  On admission, blood pressure 150/102.  Findings  included mild jugular venous distention, bibasilar crackles, soft systolic  murmur, 3+ peripheral edema.   HOSPITAL COURSE:  The patient was put on nitroglycerin, beta blockers,  Lovenox and ARB, aspirin, Lasix, IV.  She was seen by Dr. Aleen Campi and  subsequently underwent catheterization showing significant two vessel  disease.   She tolerated the dye without any changes in renal function so she  was referred to Dr. Alanda Amass who stented the LAD with plans to stabilize  that lesion and then have the patient return in a month to treat the right  coronary artery.  The patient, however, developed a drop in her hemoglobin  post procedure down as low as 7.4.  Workup showed no retroperitoneal intra-  abdominal bleeding in an abdominal pelvic CT.   DISPOSITION:  The patient is to stay on a low salt, low calorie diet and  will see Dr. Aleen Campi within two weeks.  She was started on statin so she  should have her cholesterol and liver function tests tested in one month,  recheck her hemoglobin in two weeks.  At discharge, her hemoglobin was 8.4.   Other laboratory values of note include her most recent BNP of 699, BUN and  creatinine 21 and 0.9, A1C 5.6, total cholesterol 201, LDL 129, iron  saturation 60%, and B12 287.   DISCHARGE MEDICATIONS:  Lantus 40 units at bedtime,  K-Dur 20 b.i.d., Zocor  40 daily, Micardis will be decreased to 80 daily, Protonix 40 b.i.d., Toprol  XL 25 will be increased to 50, Furosemide will be decreased to 40 b.i.d.  She will remain on Synthroid 75 daily, Plavix 75, aspirin 81.  She is to go  on B12 and multi-vitamin daily and start on Nu-Iron 150 daily.   She is to report any new episodes of chest pain, shortness of breath,  orthopnea to her physician.      Jonna L. Robb Matar, M.D.  Electronically Signed     JLB/MEDQ  D:  05/01/2005  T:  05/01/2005  Job:  [id number]   cc:   Juline Patch, M.D.  Fax: [phone]   Antionette Char, MD  Fax: [phone]   Richard A. Alanda Amass, M.D.  Fax: [phone]

## 2010-10-11 NOTE — Discharge Summary (Signed)
[name], OPPERMAN                ACCOUNT NO.:  [id number]   MEDICAL RECORD NO.:  [id number]          PATIENT TYPE:  OIB   LOCATION:  6523                         FACILITY:  [facility]   PHYSICIAN:  Madaline Savage, M.D.DATE OF BIRTH:  27-Nov-1945   DATE OF ADMISSION:  06/05/2005  DATE OF DISCHARGE:  06/06/2005                                 DISCHARGE SUMMARY   DISCHARGE DIAGNOSES:  1.  Coronary disease, staged right coronary artery intervention this      admission with Cypher stent.  2.  Coronary disease with previous left anterior descending artery stenting      on April 25, 2005 complicated by left groin bleed and anemia      postoperatively.  3.  Jehovah's Witness.  4.  Insulin-dependent diabetes.  5.  Left ventricular dysfunction with an ejection fraction of 35-40%.  6.  Hypertension.   HOSPITAL COURSE:  The patient is a pleasant 65 year old female followed by  Dr. Aleen Campi and Dr. Ricki Miller with a history of coronary disease. She underwent  LAD intervention April 25, 2005. This was after an admission for heart  failure. She had LAD intervention and this was to be followed by an RCA  intervention on staged basis. Unfortunately, she developed left groin  bleeding and anemia. Her hemoglobin dropped as low as 7.6. 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 She ultimately was discharged  home on iron and her hemoglobin has drifted back up over 10.5. She is  admitted now for elective RCA intervention. This was done June 05, 2005  by Dr. Elsie Lincoln. The RCA was dilated and stented with a Cypher stent. The  previously placed LAD stents were patent. The patient tolerated the  procedure well. She did have some groin oozing seen the night of her  procedure but this is stable at discharge. She will be ambulated today and  discharged later as she has no further problems with her right groin.   DISCHARGE MEDICATIONS:  1.  Lasix 20 milligrams a day.  2.  Lantus 40 units a  day.  3.  Potassium 10 mEq a day.  4.  Zocor 40 milligrams a day.  5.  Micardis 80 milligrams twice a day.  6.  Protonix 40 milligrams once a day.  7.  Toprol XL 25 milligrams twice a day.  8.  Synthroid 0.075 milligrams a day.  9.  Plavix 75 milligrams a day.  10. Aspirin 81 milligrams a day.  11. Nitroglycerin sublingual p.r.n.  12. Multivitamin daily.  13. Nu-Iron daily.   LABS AT DISCHARGE:  Sodium 134, potassium 4.7, BUN 39, creatinine 0.8. CK-MB  was negative. Calcium 8.8. Urine culture was obtained on the 11th and  results were pending. Chest x-ray shows resolved congestive failure.  Urinalysis showed many bacteria and small amount leukocyte esterase. INR is  1.0.   DISPOSITION:  The patient was discharged in stable condition. She will  follow up with Dr. Aleen Campi. She will need to have her urine culture  followed up.      Abelino Derrick, P.A.    ______________________________  Madaline Savage, M.D.    Lenard Lance  D:  06/06/2005  T:  06/07/2005  Job:  [id number]   cc:   Juline Patch, M.D.  Fax: [phone]   Antionette Char, MD  Fax: [phone]

## 2010-10-11 NOTE — Discharge Summary (Signed)
[name], [name]                ACCOUNT NO.:  [id number]   MEDICAL RECORD NO.:  [id number]          PATIENT TYPE:  INP   LOCATION:  0340                         FACILITY:  [facility]   PHYSICIAN:  [name], M.D. DATE OF BIRTH:  11-07-1945   DATE OF ADMISSION:  10/11/2004  DATE OF DISCHARGE:                                 DISCHARGE SUMMARY   PRIMARY CARE PHYSICIAN:  [name].   FINAL DIAGNOSES AT THE TIME OF DISCHARGE:  1.  Chest pain with atypical features.  2.  Uncontrolled diabetes mellitus/hyperglycemia.  3.  Uncontrolled hypertension.  4.  Hypercholesterolemia.   SECONDARY DIAGNOSIS:  Fibromyalgia.   HISTORY OF PRESENT ILLNESS:  [name] is a 65 year old [place] female  with a past medical history of fibromyalgia, diabetes mellitus type 2, who  arrived in the emergency room with a chief complaint of chest pain. She  stated that the chest pain had initially started the morning of her  admission and had increased throughout the course of the day up until  approximately 4:30 p.m. at which time she decided to leave her place of  employment and come to the emergency room for further evaluation. She denied  having any orthopnea or PND and stated that she had never had any chest pain  that was similar to her current chest pain.   PAST MEDICAL HISTORY:  1.  Fibromyalgia.  2.  Asthma with the last attack being 30 years ago.  3.  Diabetes mellitus type 2.  4.  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   PAST SURGICAL HISTORY:  1.  Total abdominal hysterectomy secondary to fibroid tumors.  2.  Cholecystectomy.   ALLERGIES:  SHRIMP.   HOME MEDICATIONS:  1.  Motrin with Codeine.  2.  Tylenol.  3.  The patient was supposed to take Glucophage; however, she stated that      she stopped it at least a year ago secondary to it causing abdominal      pain and therefore had not been taking any medication for her diabetes       mellitus.   SOCIAL HISTORY:  1.  Cigarettes:  The patient stopped smoking 20 years ago. She initially      started smoking at the age of 33 and she smoked approximately three      packs per day.  2.  Alcohol:  Occasionally.  3.  Street drugs:  The patient denies.   FAMILY HISTORY:  Mother has a history of hypertension and diabetes mellitus.  Father:  Medical history is unknown. Maternal uncle had a triple bypass.  Maternal grandmother:  History of angina and diabetes mellitus.   HOSPITAL COURSE:  #1 - CHEST PAIN. When the patient initially presented, she  had a portable chest x-ray completed. The final interpretation was mild  peribronchial thickening without focal air space disease. Likewise, she had  an EKG completed which revealed normal sinus rhythm, good R-wave  progression, and some small T wave inversions in V5 through V6. She was  admitted to the telemetry floor for further  evaluation. Her cardiac enzymes  were ordered to rule her out for any type of acute coronary event and her  enzymes were found to be 0.02 for troponin/negative x3. In addition,  cardiology was consulted and Dr. Lucas [name] was the cardiologist who responded to  the consultation. When he saw the patient on Oct 15, 2004 he stated that  given the fact that the patient had diabetes and renal problems, he would be  very reluctant to recommend cardiac catheterization unless the patient had  undergone a stress test and it was interpreted as high risk. Therefore, a  nuclear myocardial perfusion study was ordered and it was completed a few  days later. The final result was negative for ischemia or infarct. According  to Dr. Adelene [name] note, there was possibly segmental myocardial wall motion  abnormality but Dr. Aleen [name] stated that he did not feel a cardiac  catheterization was indicated at this time. He went on to recommend treating  the patient for chest wall pain and stated that he would follow up in the  office with  the patient. In addition, on Oct 14, 2004 the patient had a 2-D  echocardiogram completed. The final results were overall left ventricular  systolic function was normal, left ventricular ejection fraction was  estimated to range between 55-65%. However, the study was noted to be  inadequate for evaluation of left ventricular regional wall motion. Left  ventricular wall thickness was mildly to moderately increased and, again,  the patient will follow up with Dr. Aleen [name] regarding this.   #2 - UNCONTROLLED DIABETES MELLITUS. At the time of the patient's initial  presentation into the hospital, her glucose was noted to be 510. Accu-Cheks  were subsequently initiated and likewise the patient was started on a  regimen of Lantus insulin. Her sugars remained slightly elevated over the  course of her hospitalization but better controlled than at the time of her  admission. The patient will follow up with her primary care physician for  optimizing control of her diabetes mellitus.   #3 - UNCONTROLLED HYPERTENSION. When the patient first arrived into the  emergency room her blood pressure was noted to be 192/102. She subsequently  was started on antihypertensive medications consisting of lisinopril and  Lopressor and over the course of her hospitalization her blood pressure  again became better controlled although not perfectly controlled and again,  for this, the patient can follow up with her primary care physician.   #4 - HYPERLIPIDEMIA. At the time of the patient's admission she had a  fasting lipid profile completed and final results of which were as follows:  Cholesterol 281, triglycerides 486, HDL 43. Therefore, the decision was made  to start Zocor 20 mg daily.   #5 - HISTORY OF FIBROMYALGIA. This may have played a role in the chest pain  that the patient was initially experiencing at the time of her admission.  The patient was provided pain medication on a p.r.n. basis.  CONDITION AT  THE TIME OF DISCHARGE:  Significantly improved. Today, the  patient states that she really wants to go home and she denies having any  chest pain or shortness of breath. Her vital signs:  Her temperature is  98.0, heart rate 67, respirations 18, and blood pressure 145/76. Her last  three CBGs have been 215, 149, and 114. The patient will be discharged home.   Her discharge medications will consist of the following:  1.  Lisinopril 20 mg one tablet p.o. daily.  2.  Lopressor 25 mg p.o. b.i.d.  3.  Ecotrin 325 mg p.o. daily.  4.  Colace 100 mg p.o. daily.  5.  K-Dur 20 mEq p.o. b.i.d.  6.  Zocor 20 mg p.o. daily.  7.  Lantus insulin 34 units subcu q.h.s.   Again, the patient has been instructed to follow up with [name]  within 2-3 weeks for further evaluation, and she is scheduled to see Dr.  Aleen [name] within 2 weeks.      OR/MEDQ  D:  10/17/2004  T:  10/17/2004  Job:  [id number]   cc:   Juline [name], M.D.  [address]  [place], [place] [address]  Fax: [phone]   Antionette Char, MD  [address]  [place]  [place] [address]  Fax: [phone]

## 2010-10-11 NOTE — Discharge Summary (Signed)
[name], [name]                ACCOUNT NO.:  [id number]   MEDICAL RECORD NO.:  [id number]          PATIENT TYPE:  INP   LOCATION:  4739                         FACILITY:  [facility]   PHYSICIAN:  Lonia Blood, M.D.       DATE OF BIRTH:  [birth year]   DATE OF ADMISSION:  08/07/2005  DATE OF DISCHARGE:  08/09/2005                           DISCHARGE SUMMARY - REFERRING   DISCHARGE DIAGNOSES:  1.  Acute renal insufficiency secondary to over diuresis.  2.  Dehydration secondary to over diuresis, resolved.  3.  Orthostatic hypotension secondary to over diuresis, resolved.  4.  Ischemic cardiomyopathy status post stent placement in the LAD in      December, 2006, and stent placement in the right coronary artery in      January, 2007.  5.  Diabetes mellitus.  6.  Fibromyalgia.  7.  Hyperlipidemia.  8.  Gastroesophageal reflux disease.  9.  Hypothyroidism currently overly replaced.  10. Stage 2 chronic kidney disease.  11. Diabetic neuropathy.   DISCHARGE MEDICATIONS:  1.  Furosemide 20 mg p.o. daily.  2.  Lantus 40 units at h.s.  3.  Zocor 40 mg at h.s.  4.  Micardis 20 mg daily.  5.  Protonix 40 mg daily.  6.  Toprol XL 25 mg daily.  7.  Synthroid 25 mcg daily.  8.  Aspirin 81 mg daily.  9.  Plavix 75 mg daily.  10. Multivitamin one daily.  11. Prednisone 5 mg daily.  12. Ultram p.r.n. pain.  13. Darvocet p.r.n. pain.   CONDITION ON DISCHARGE:  [name] was discharged in good condition. At the  time of discharge she was without dizziness, without any chest pain or any  difficulties breathing. She was instructed to follow a low salt diabetic  diet  to decrease her doses of Micardis, Synthroid, and not to take any torsemide.   The patient will follow up with Dr. Juline Patch on August 11, 2005. At the  time of her follow up visit she will have a basic metabolic profile checked  to assure that her BUN and creatinine remain stable.   For admission history and physical, refer to  the dictated H&P done by Dr.  Mikeal Hawthorne. Briefly, [name] is a 65 year old woman with known diabetic  nephropathy and ischemic cardiomyopathy who presented to [facility]  emergency room with severe dizziness. She was found to have an extremely  elevated BUN and was admitted for further work up and observation.   PROCEDURES DURING THIS ADMISSION:  No procedures were done.   CONSULTATIONS:  No consultations were obtained.   HOSPITAL COURSE:  PROBLEM #1. Acute renal insufficiency secondary to over  diuresis. [name]'s BUN on admission was 126 and creatinine was 1.3. She  responded well to intravenous normal saline for 48 hours as well as  withholding the angiotensin receptor blocker and the diuretics. At the time  of discharge [name] was advised to resume a very low dose diuretic  together with a lower dose of angiotension receptor blocker and follow up  with her primary care physician  on Monday, August 11, 2005. At the time of  discharge [name]'s BUN was 45, and creatinine was 0.9.   PROBLEM #2. Hypothyroidism. [name] had a TSH check that was 0.090,  indicative of over replacement. Her dose of Synthroid was decreased to 25  mcg daily and further checks of her TSH to be done by the family care  physician.   PROBLEM #3. Diabetes mellitus. This seems to be very well controlled in the  outpatient setting, will continue the patient on her same dose of Lantus.   PROBLEM #4. Ischemic cardiomyopathy. [name] was continued to her doses  of aspirin and Plavix without any changes. She did not have any acute events  during this hospitalization.      Lonia Blood, M.D.  Electronically Signed     SL/MEDQ  D:  08/09/2005  T:  08/09/2005  Job:  [id number]   cc:   Juline Patch, M.D.  Fax: [phone]   [facility]

## 2010-10-25 IMAGING — CR DG HIP 1V PORT*L*
1 series · 1 of 1 positions shown · non-contrast
Comparison: Preoperative exam

CLINICAL DATA: Postoperative exam after left total hip replacement

PORTABLE LEFT HIP - 1 VIEW

[view not recorded]
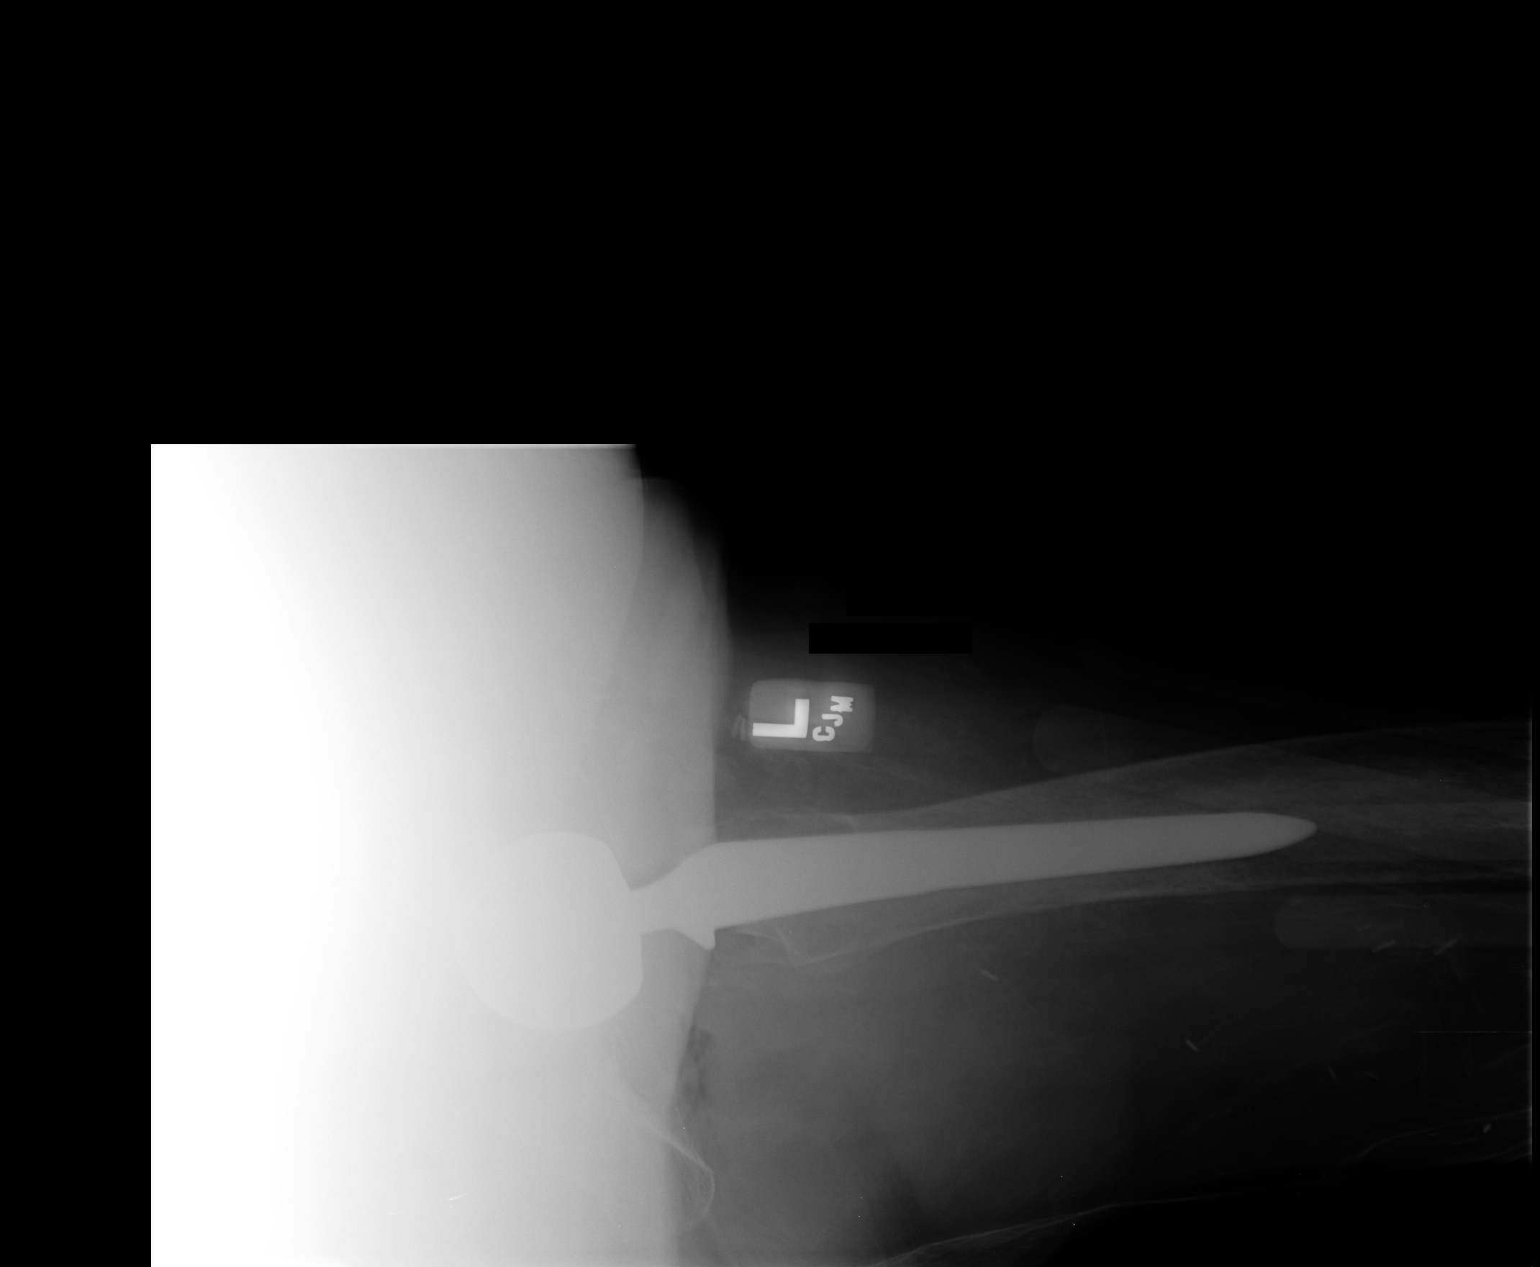

[1 of 1 positions shown; findings below may reference images not displayed]

FINDINGS: Expected postoperative appearance after left hip
arthroplasty.  No fracture line or evidence for hardware failure.
IMPRESSION: Expected appearance after left hip arthroplasty.

## 2010-12-10 ENCOUNTER — Ambulatory Visit (HOSPITAL_COMMUNITY): Payer: Medicare (Managed Care) | Attending: Vascular Surgery

## 2010-12-10 DIAGNOSIS — N186 End stage renal disease: Secondary | ICD-10-CM | POA: Insufficient documentation

## 2010-12-10 DIAGNOSIS — Z452 Encounter for adjustment and management of vascular access device: Secondary | ICD-10-CM

## 2010-12-10 DIAGNOSIS — I120 Hypertensive chronic kidney disease with stage 5 chronic kidney disease or end stage renal disease: Secondary | ICD-10-CM

## 2010-12-19 ENCOUNTER — Ambulatory Visit (INDEPENDENT_AMBULATORY_CARE_PROVIDER_SITE_OTHER): Payer: Medicare (Managed Care) | Admitting: Thoracic Diseases

## 2010-12-19 VITALS — HR 65 | Resp 14

## 2010-12-19 DIAGNOSIS — Z4801 Encounter for change or removal of surgical wound dressing: Secondary | ICD-10-CM

## 2010-12-19 DIAGNOSIS — N186 End stage renal disease: Secondary | ICD-10-CM

## 2010-12-19 NOTE — Progress Notes (Signed)
[name] is a 65 y.o. female who had a Diatek catheter removed last week and had sutures to be removed today.  She is ding well with no complaints. Her AVF has good thrill and Bruit and is working well in HD.  2 stitches were removed from left side at catheter site without difficulty

## 2011-02-14 LAB — IRON AND TIBC
Iron: 72
TIBC: 258
UIBC: 186

## 2011-02-14 LAB — CBC
RBC: 3.37 — ABNORMAL LOW
WBC: 6.7

## 2011-02-14 LAB — FERRITIN: Ferritin: 38 (ref 10–291)

## 2011-02-17 LAB — CBC
MCHC: 33.3
MCV: 90.3
RDW: 15.3

## 2011-02-18 LAB — CBC
MCHC: 33.3
Platelets: 190
RBC: 3.97
WBC: 7.4

## 2011-02-18 LAB — HEMOGLOBIN A1C
Hgb A1c MFr Bld: 7.4 — ABNORMAL HIGH
Mean Plasma Glucose: 186

## 2011-02-20 LAB — IRON AND TIBC
Iron: 73
Iron: 61
Saturation Ratios: 33
Saturation Ratios: 28
TIBC: 219 — ABNORMAL LOW
TIBC: 216 — ABNORMAL LOW
UIBC: 146
UIBC: 155

## 2011-02-20 LAB — FERRITIN
Ferritin: 45 (ref 10–291)
Ferritin: 41 (ref 10–291)

## 2011-02-20 LAB — CBC
HCT: 35.9 — ABNORMAL LOW
HCT: 34.4 — ABNORMAL LOW
Hemoglobin: 12.2
Hemoglobin: 11.6 — ABNORMAL LOW
MCHC: 33.9
MCHC: 33.7
MCV: 89.7
MCV: 89.9
Platelets: 250
Platelets: 262
RBC: 4
RBC: 3.83 — ABNORMAL LOW
RDW: 16.5 — ABNORMAL HIGH
RDW: 15.5
WBC: 7.4
WBC: 8

## 2011-02-21 LAB — IRON AND TIBC
Iron: 50
Saturation Ratios: 23
TIBC: 221 — ABNORMAL LOW
UIBC: 171

## 2011-02-21 LAB — CBC
HCT: 30.6 — ABNORMAL LOW
Hemoglobin: 10.5 — ABNORMAL LOW
MCHC: 34.3
MCV: 90.4
RBC: 3.38 — ABNORMAL LOW
RDW: 14.8

## 2011-02-21 LAB — COMPREHENSIVE METABOLIC PANEL
AST: 15
Albumin: 2.3 — ABNORMAL LOW
Calcium: 8.2 — ABNORMAL LOW
Chloride: 114 — ABNORMAL HIGH
Creatinine, Ser: 4.69 — ABNORMAL HIGH
GFR calc Af Amer: 12 — ABNORMAL LOW
Total Bilirubin: 0.7
Total Protein: 5.4 — ABNORMAL LOW

## 2011-02-21 LAB — HEMOGLOBIN A1C: Mean Plasma Glucose: 140

## 2011-02-21 LAB — TSH: TSH: 3.073

## 2011-02-21 LAB — POCT I-STAT 4, (NA,K, GLUC, HGB,HCT): Hemoglobin: 11.9 — ABNORMAL LOW

## 2011-02-24 LAB — POCT I-STAT 4, (NA,K, GLUC, HGB,HCT)
Hemoglobin: 12.2
Hemoglobin: 9.9 — ABNORMAL LOW
Potassium: 5.1
Sodium: 136

## 2011-02-24 LAB — IRON AND TIBC
Iron: 55
Saturation Ratios: 26
TIBC: 209 — ABNORMAL LOW
UIBC: 154

## 2011-02-24 LAB — POCT I-STAT, CHEM 8
Chloride: 113 — ABNORMAL HIGH
Creatinine, Ser: 3.9 — ABNORMAL HIGH
Glucose, Bld: 66 — ABNORMAL LOW
HCT: 33 — ABNORMAL LOW
Hemoglobin: 11.2 — ABNORMAL LOW
Potassium: 3.8
Sodium: 142

## 2011-02-24 LAB — POCT I-STAT GLUCOSE: Operator id: 140821

## 2011-02-24 LAB — GLUCOSE, CAPILLARY
Glucose-Capillary: 65 — ABNORMAL LOW
Glucose-Capillary: 194 — ABNORMAL HIGH

## 2011-02-24 LAB — FERRITIN: Ferritin: 65 (ref 10–291)

## 2011-02-24 LAB — POCT HEMOGLOBIN-HEMACUE: Hemoglobin: 10.1 — ABNORMAL LOW

## 2011-02-24 IMAGING — XA IR PTA VENOUS
1 series · 14 of 24 positions shown · IV contrast (IODINE)
Comparison: No prior shunt studies.

CLINICAL DATA: End-stage renal disease.  Decreased access flow
rates via left forearm loop dialysis graft.

1.  DIALYSIS AV SHUNTOGRAM
2.  ANGIOPLASTY OF DIALYSIS GRAFT

[Series 300: sp av dialysis shunt intro needle *l* · 14 of 36 slices shown]
[im 1/36]
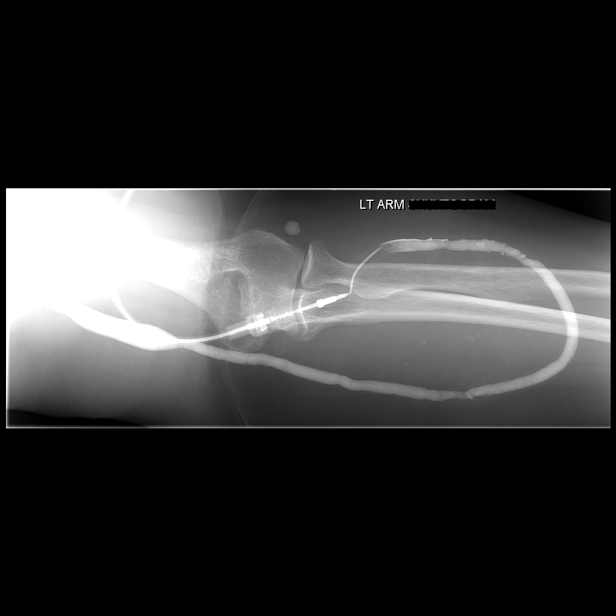
[im 4/36]
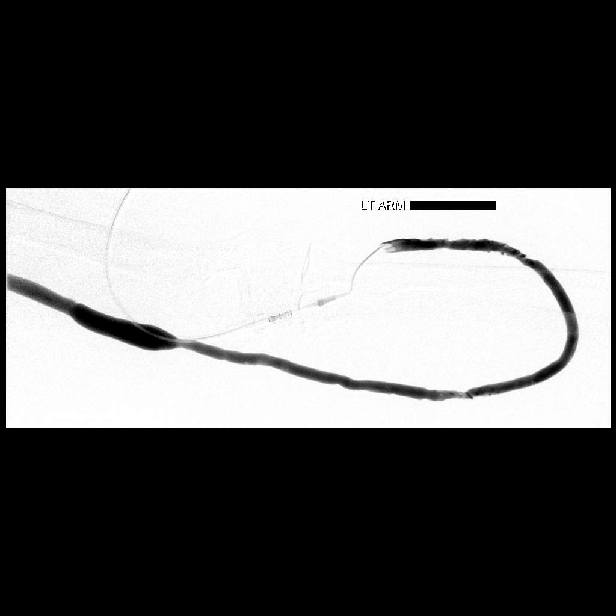
[im 7/36]
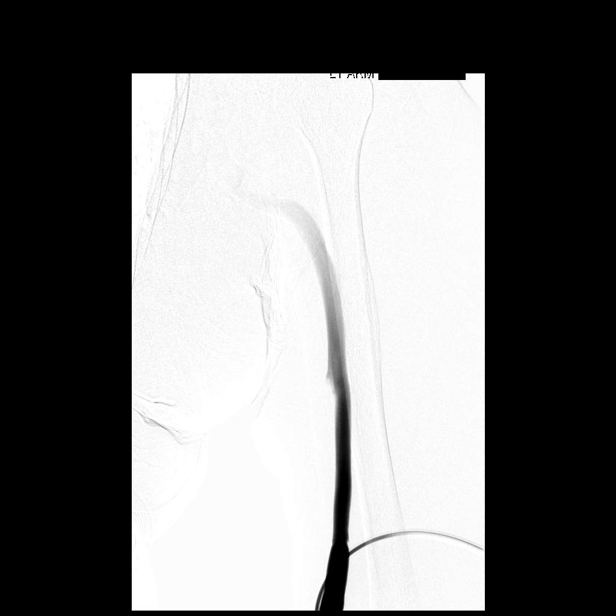
[im 10/36]
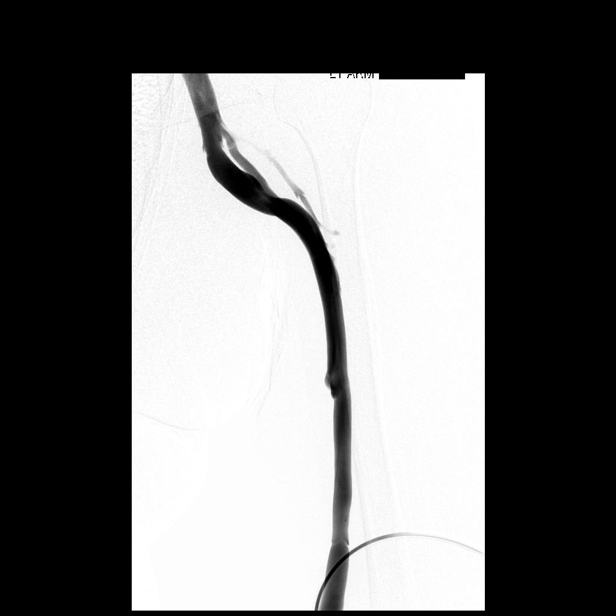
[im 11/36]
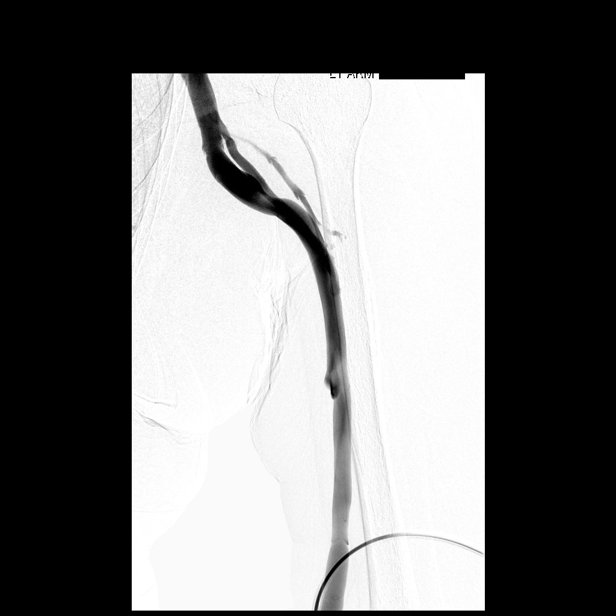
[im 14/36]
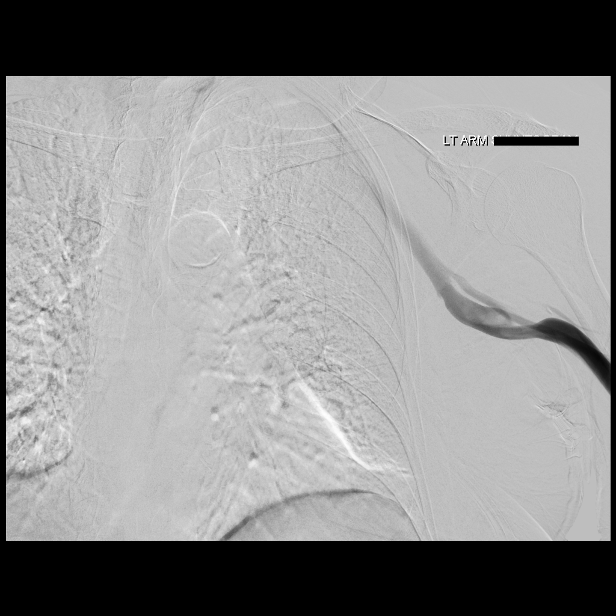
[im 17/36]
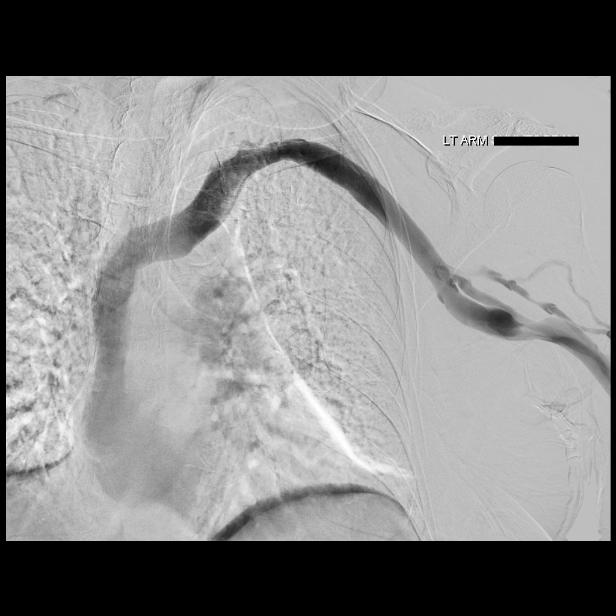
[im 19/36]
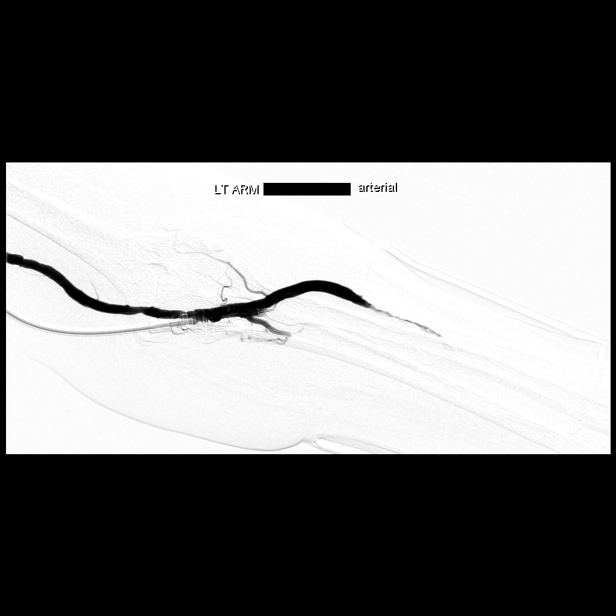
[im 22/36]
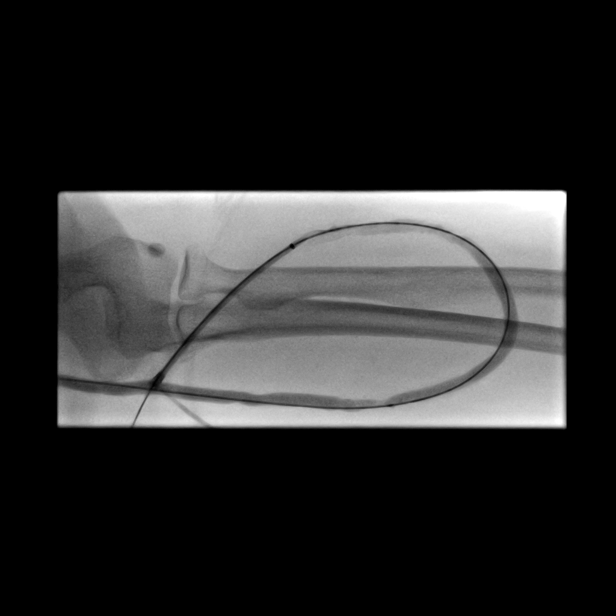
[im 25/36]
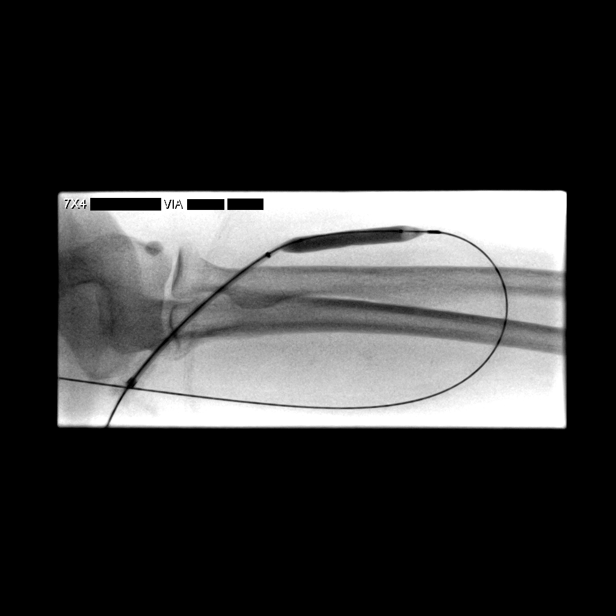
[im 28/36]
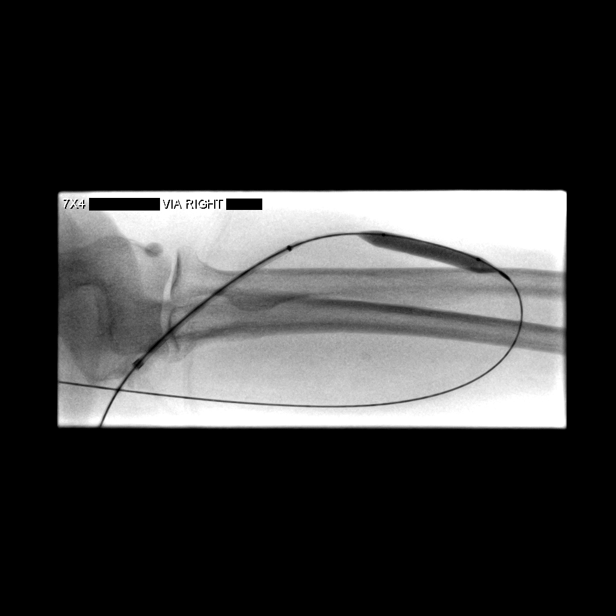
[im 29/36]
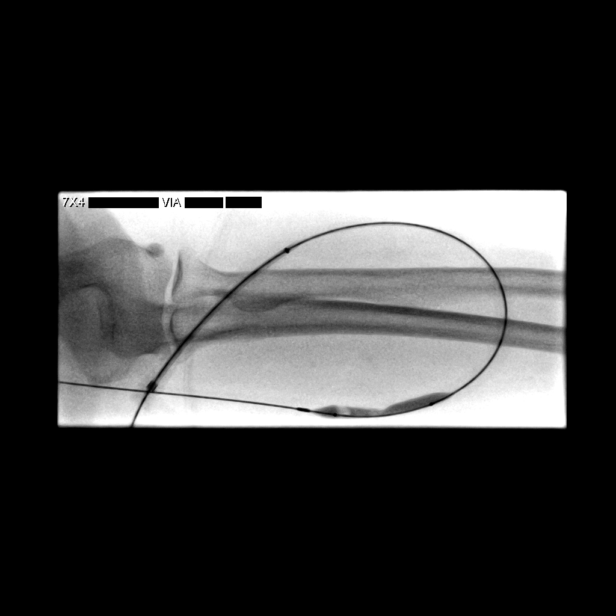
[im 32/36]
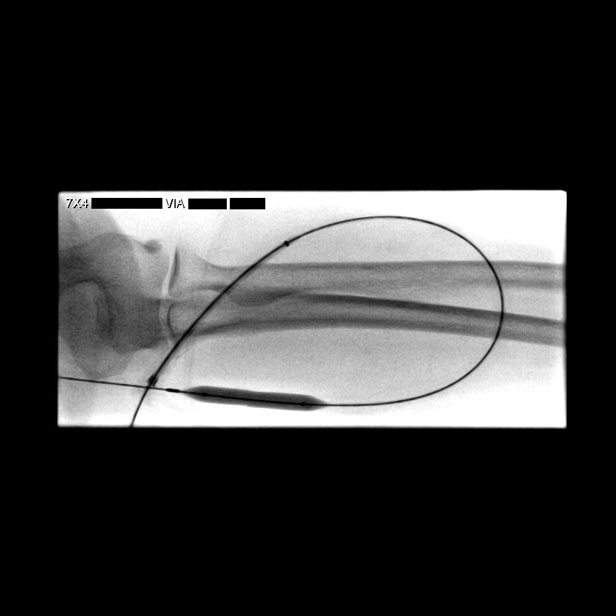
[im 36/36]
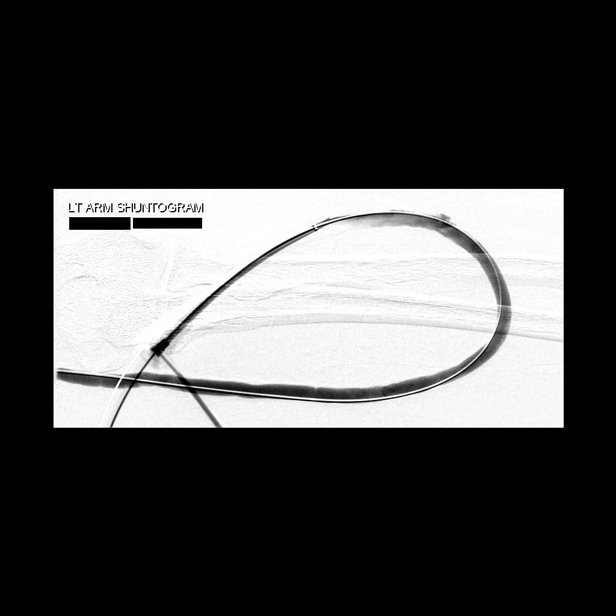

[14 of 24 positions shown; findings below may reference images not displayed]

Contrast:  50 ml Imnipaque-EXX

Fluoroscopy Time: 2.0 minutes.

Procedure:  The procedure, risks, benefits, and alternatives were
explained to the patient.  Questions regarding the procedure were
encouraged and answered.  The patient understands and consents to
the procedure.

The left arm dialysis graft was prepped with Betadine in a sterile
fashion, and a sterile drape was applied covering the operative
field.  A diagnostic shunt study was performed via an 18 gauge
angiocatheter introduced into venous outflow.  Venous drainage was
assessed to the level of the central veins in the chest.  Proximal
shunt was studied by reflux maneuver with temporary compression of
venous outflow.

The angiocath was removed and replaced with a 6-French sheath over
a guidewire.  Balloon angioplasty of the dialysis graft was then
performed with a 7 mm x 4 cm Conquest balloon.  Balloon angioplasty
was followed by additional angiography.

The sheath was removed and hemostasis obtained with application of
a 2-0 Ethilon pursestring suture.

Complications: None
FINDINGS: Arterial anastomosis with the brachial artery is widely
patent.  The dialysis graft itself shows two separate areas of
significant focal stenosis in both arterial and venous limbs.
Milder tandem areas of stenosis also present in both arterial and
venous limbs.  The venous anastomosis is normally patent.

Venous outflow is widely patent including upper arm deep veins, the
axillary vein, subclavian vein and innominate vein.  The SVC is
widely patent.

All areas of intragraft stenosis responded very well to 7 mm
balloon angioplasty.  Completion angiography shows mild
irregularity remaining in the graft without significant focal
stenosis.
IMPRESSION: Significant intragraft stenoses present in both arterial and venous
limbs.  These were successfully treated with 7 mm balloon
angioplasty with improved patency.

Access Management: The graft remains amenable to percutaneous
intervention.

## 2011-02-25 LAB — CBC
HCT: 30 — ABNORMAL LOW
Hemoglobin: 9.7 — ABNORMAL LOW
Platelets: 265
RDW: 14.9
WBC: 8.4

## 2011-02-25 LAB — IRON AND TIBC
Saturation Ratios: 32
TIBC: 207 — ABNORMAL LOW
UIBC: 140

## 2011-04-08 ENCOUNTER — Emergency Department (HOSPITAL_COMMUNITY): Payer: Medicare (Managed Care)

## 2011-04-08 ENCOUNTER — Other Ambulatory Visit: Payer: Self-pay

## 2011-04-08 ENCOUNTER — Inpatient Hospital Stay (HOSPITAL_COMMUNITY)
Admission: EM | Admit: 2011-04-08 | Discharge: 2011-04-10 | DRG: 313 | Disposition: A | Discharge status: Home / Self Care | Payer: Medicare (Managed Care) | Source: Ambulatory Visit | Attending: Infectious Diseases | Admitting: Infectious Diseases

## 2011-04-08 DIAGNOSIS — R079 Chest pain, unspecified: Secondary | ICD-10-CM

## 2011-04-08 DIAGNOSIS — E119 Type 2 diabetes mellitus without complications: Secondary | ICD-10-CM | POA: Insufficient documentation

## 2011-04-08 DIAGNOSIS — F329 Major depressive disorder, single episode, unspecified: Secondary | ICD-10-CM | POA: Insufficient documentation

## 2011-04-08 DIAGNOSIS — I739 Peripheral vascular disease, unspecified: Secondary | ICD-10-CM | POA: Insufficient documentation

## 2011-04-08 DIAGNOSIS — G5603 Carpal tunnel syndrome, bilateral upper limbs: Secondary | ICD-10-CM | POA: Insufficient documentation

## 2011-04-08 DIAGNOSIS — L940 Localized scleroderma [morphea]: Secondary | ICD-10-CM | POA: Insufficient documentation

## 2011-04-08 DIAGNOSIS — I509 Heart failure, unspecified: Secondary | ICD-10-CM | POA: Diagnosis present

## 2011-04-08 DIAGNOSIS — M810 Age-related osteoporosis without current pathological fracture: Secondary | ICD-10-CM | POA: Insufficient documentation

## 2011-04-08 DIAGNOSIS — F411 Generalized anxiety disorder: Secondary | ICD-10-CM | POA: Diagnosis present

## 2011-04-08 DIAGNOSIS — E11319 Type 2 diabetes mellitus with unspecified diabetic retinopathy without macular edema: Secondary | ICD-10-CM | POA: Insufficient documentation

## 2011-04-08 DIAGNOSIS — N186 End stage renal disease: Secondary | ICD-10-CM | POA: Diagnosis present

## 2011-04-08 DIAGNOSIS — Z87891 Personal history of nicotine dependence: Secondary | ICD-10-CM

## 2011-04-08 DIAGNOSIS — I10 Essential (primary) hypertension: Secondary | ICD-10-CM | POA: Diagnosis present

## 2011-04-08 DIAGNOSIS — Z9861 Coronary angioplasty status: Secondary | ICD-10-CM

## 2011-04-08 DIAGNOSIS — N2581 Secondary hyperparathyroidism of renal origin: Secondary | ICD-10-CM | POA: Insufficient documentation

## 2011-04-08 DIAGNOSIS — S88119A Complete traumatic amputation at level between knee and ankle, unspecified lower leg, initial encounter: Secondary | ICD-10-CM

## 2011-04-08 DIAGNOSIS — E039 Hypothyroidism, unspecified: Secondary | ICD-10-CM | POA: Diagnosis present

## 2011-04-08 DIAGNOSIS — G629 Polyneuropathy, unspecified: Secondary | ICD-10-CM | POA: Insufficient documentation

## 2011-04-08 DIAGNOSIS — F3289 Other specified depressive episodes: Secondary | ICD-10-CM | POA: Diagnosis present

## 2011-04-08 DIAGNOSIS — I2510 Atherosclerotic heart disease of native coronary artery without angina pectoris: Secondary | ICD-10-CM | POA: Diagnosis present

## 2011-04-08 DIAGNOSIS — I120 Hypertensive chronic kidney disease with stage 5 chronic kidney disease or end stage renal disease: Secondary | ICD-10-CM | POA: Diagnosis present

## 2011-04-08 DIAGNOSIS — I252 Old myocardial infarction: Secondary | ICD-10-CM

## 2011-04-08 DIAGNOSIS — K219 Gastro-esophageal reflux disease without esophagitis: Secondary | ICD-10-CM | POA: Diagnosis present

## 2011-04-08 DIAGNOSIS — F419 Anxiety disorder, unspecified: Secondary | ICD-10-CM | POA: Diagnosis present

## 2011-04-08 DIAGNOSIS — E559 Vitamin D deficiency, unspecified: Secondary | ICD-10-CM | POA: Insufficient documentation

## 2011-04-08 DIAGNOSIS — E1129 Type 2 diabetes mellitus with other diabetic kidney complication: Secondary | ICD-10-CM | POA: Diagnosis present

## 2011-04-08 DIAGNOSIS — D631 Anemia in chronic kidney disease: Secondary | ICD-10-CM | POA: Diagnosis present

## 2011-04-08 DIAGNOSIS — Z531 Procedure and treatment not carried out because of patient's decision for reasons of belief and group pressure: Secondary | ICD-10-CM | POA: Insufficient documentation

## 2011-04-08 DIAGNOSIS — IMO0001 Reserved for inherently not codable concepts without codable children: Secondary | ICD-10-CM | POA: Insufficient documentation

## 2011-04-08 DIAGNOSIS — M797 Fibromyalgia: Secondary | ICD-10-CM | POA: Insufficient documentation

## 2011-04-08 DIAGNOSIS — R0789 Other chest pain: Principal | ICD-10-CM | POA: Diagnosis present

## 2011-04-08 HISTORY — DX: Carpal tunnel syndrome, bilateral upper limbs: G56.03

## 2011-04-08 HISTORY — DX: Fibromyalgia: M79.7

## 2011-04-08 HISTORY — DX: Major depressive disorder, single episode, unspecified: F32.9

## 2011-04-08 HISTORY — DX: Secondary hyperparathyroidism of renal origin: N25.81

## 2011-04-08 HISTORY — DX: Localized scleroderma (morphea): L94.0

## 2011-04-08 HISTORY — DX: Atherosclerotic heart disease of native coronary artery without angina pectoris: I25.10

## 2011-04-08 HISTORY — DX: Polyneuropathy, unspecified: G62.9

## 2011-04-08 HISTORY — DX: Migraine, unspecified, not intractable, without status migrainosus: G43.909

## 2011-04-08 HISTORY — DX: Hypothyroidism, unspecified: E03.9

## 2011-04-08 HISTORY — DX: Anxiety disorder, unspecified: F41.9

## 2011-04-08 HISTORY — DX: Other specified postprocedural states: R11.2

## 2011-04-08 HISTORY — DX: Dependence on renal dialysis: N18.6

## 2011-04-08 HISTORY — DX: Other specified postprocedural states: Z98.890

## 2011-04-08 HISTORY — DX: Peripheral vascular disease, unspecified: I73.9

## 2011-04-08 HISTORY — DX: Procedure and treatment not carried out because of patient's decision for reasons of belief and group pressure: Z53.1

## 2011-04-08 HISTORY — DX: Essential (primary) hypertension: I10

## 2011-04-08 HISTORY — DX: Hyperlipidemia, unspecified: E78.5

## 2011-04-08 HISTORY — DX: Reserved for inherently not codable concepts without codable children: IMO0001

## 2011-04-08 HISTORY — DX: Dependence on renal dialysis: Z99.2

## 2011-04-08 LAB — CBC
HCT: 39.3 % (ref 36.0–46.0)
Hemoglobin: 12.1 g/dL (ref 12.0–15.0)
MCH: 29.3 pg (ref 26.0–34.0)
MCHC: 30.8 g/dL (ref 30.0–36.0)
MCV: 95.2 fL (ref 78.0–100.0)
Platelets: 140 10*3/uL — ABNORMAL LOW (ref 150–400)
RBC: 4.13 MIL/uL (ref 3.87–5.11)
RDW: 16.3 % — ABNORMAL HIGH (ref 11.5–15.5)
WBC: 7.3 10*3/uL (ref 4.0–10.5)

## 2011-04-08 LAB — BASIC METABOLIC PANEL
BUN: 23 mg/dL (ref 6–23)
CO2: 24 mEq/L (ref 19–32)
Calcium: 8.9 mg/dL (ref 8.4–10.5)
Chloride: 97 mEq/L (ref 96–112)
Creatinine, Ser: 3.93 mg/dL — ABNORMAL HIGH (ref 0.50–1.10)
GFR calc Af Amer: 13 mL/min — ABNORMAL LOW (ref >90)
GFR calc non Af Amer: 11 mL/min — ABNORMAL LOW (ref >90)
Glucose, Bld: 110 mg/dL — ABNORMAL HIGH (ref 70–99)
Potassium: 3.6 mEq/L (ref 3.5–5.1)
Sodium: 136 mEq/L (ref 135–145)

## 2011-04-08 LAB — CARDIAC PANEL(CRET KIN+CKTOT+MB+TROPI)
CK, MB: 2.8 ng/mL (ref 0.3–4.0)
Relative Index: INVALID (ref 0.0–2.5)
Total CK: 33 U/L (ref 7–177)
Troponin I: <0.3 ng/mL (ref <0.30)

## 2011-04-08 LAB — MRSA PCR SCREENING: MRSA by PCR: NEGATIVE

## 2011-04-08 LAB — PRO B NATRIURETIC PEPTIDE: Pro B Natriuretic peptide (BNP): >70000 pg/mL — ABNORMAL HIGH (ref 0–125)

## 2011-04-08 LAB — TROPONIN I: Troponin I: <0.3 ng/mL (ref <0.30)

## 2011-04-08 MED ORDER — SODIUM CHLORIDE 0.9 % IJ SOLN: 3.0000 mL | INTRAMUSCULAR | Status: DC | PRN

## 2011-04-08 MED ORDER — PROMETHAZINE HCL 25 MG PO TABS: 25.0000 mg | ORAL_TABLET | ORAL | Status: DC | PRN

## 2011-04-08 MED ORDER — ONDANSETRON HCL 4 MG/2ML IJ SOLN
INTRAMUSCULAR | Status: AC
  Administered 2011-04-08: 4 mg via INTRAVENOUS
  Filled 2011-04-08: qty 2

## 2011-04-08 MED ORDER — LEVOTHYROXINE SODIUM 112 MCG PO TABS
112.0000 ug | ORAL_TABLET | Freq: Every day | ORAL | Status: DC
  Administered 2011-04-08 – 2011-04-10 (×3): 112 ug via ORAL
  Filled 2011-04-08 (×3): qty 1

## 2011-04-08 MED ORDER — ASPIRIN 81 MG PO TABS: 81.0000 mg | ORAL_TABLET | Freq: Every day | ORAL | Status: DC

## 2011-04-08 MED ORDER — SODIUM CHLORIDE 0.9 % IV SOLN: INTRAVENOUS | Status: DC

## 2011-04-08 MED ORDER — ASPIRIN EC 81 MG PO TBEC
81.0000 mg | DELAYED_RELEASE_TABLET | Freq: Every day | ORAL | Status: DC
  Administered 2011-04-09 – 2011-04-10 (×2): 81 mg via ORAL
  Filled 2011-04-08 (×2): qty 1

## 2011-04-08 MED ORDER — METOPROLOL SUCCINATE ER 25 MG PO TB24
25.0000 mg | ORAL_TABLET | Freq: Every day | ORAL | Status: DC
Start: 2011-04-08 — End: 2011-04-10
  Administered 2011-04-08 – 2011-04-10 (×3): 25 mg via ORAL
  Filled 2011-04-08 (×3): qty 1

## 2011-04-08 MED ORDER — SODIUM CHLORIDE 0.9 % IJ SOLN
3.0000 mL | Freq: Two times a day (BID) | INTRAMUSCULAR | Status: DC
  Administered 2011-04-08 – 2011-04-10 (×4): 3 mL via INTRAVENOUS

## 2011-04-08 MED ORDER — MORPHINE SULFATE 2 MG/ML IJ SOLN: 1.0000 mg | INTRAMUSCULAR | Status: DC | PRN

## 2011-04-08 MED ORDER — SIMVASTATIN 5 MG PO TABS
5.0000 mg | ORAL_TABLET | Freq: Every day | ORAL | Status: DC
  Administered 2011-04-09: 5 mg via ORAL
  Filled 2011-04-08 (×2): qty 1

## 2011-04-08 MED ORDER — SODIUM CHLORIDE 0.9 % IV SOLN: 250.0000 mL | INTRAVENOUS | Status: DC

## 2011-04-08 MED ORDER — DIALYVITE 3000 3 MG PO TABS
1.0000 | ORAL_TABLET | Freq: Every day | ORAL | Status: DC
  Filled 2011-04-08: qty 1

## 2011-04-08 MED ORDER — FLUOXETINE HCL 20 MG PO CAPS
20.0000 mg | ORAL_CAPSULE | Freq: Every day | ORAL | Status: DC
  Administered 2011-04-08 – 2011-04-10 (×3): 20 mg via ORAL
  Filled 2011-04-08 (×3): qty 1

## 2011-04-08 MED ORDER — ONDANSETRON HCL 4 MG/2ML IJ SOLN: 4.0000 mg | Freq: Three times a day (TID) | INTRAMUSCULAR | Status: DC | PRN

## 2011-04-08 MED ORDER — ALPRAZOLAM 0.5 MG PO TABS
0.5000 mg | ORAL_TABLET | Freq: Every evening | ORAL | Status: DC | PRN
  Administered 2011-04-08 – 2011-04-09 (×2): 0.5 mg via ORAL
  Filled 2011-04-08 (×2): qty 1

## 2011-04-08 MED ORDER — LISINOPRIL 20 MG PO TABS
20.0000 mg | ORAL_TABLET | Freq: Every day | ORAL | Status: DC
  Administered 2011-04-08 – 2011-04-09 (×2): 20 mg via ORAL
  Filled 2011-04-08 (×3): qty 1

## 2011-04-08 MED ORDER — NITROGLYCERIN 0.4 MG SL SUBL
0.4000 mg | SUBLINGUAL_TABLET | SUBLINGUAL | Status: DC | PRN
Start: 2011-04-08 — End: 2011-04-10

## 2011-04-08 MED ORDER — HEPARIN SODIUM (PORCINE) 5000 UNIT/ML IJ SOLN
5000.0000 [IU] | Freq: Three times a day (TID) | INTRAMUSCULAR | Status: DC
  Administered 2011-04-08 – 2011-04-10 (×4): 5000 [IU] via SUBCUTANEOUS
  Filled 2011-04-08 (×7): qty 1

## 2011-04-08 NOTE — ED Notes (Signed)
Pt undressed, in gown, on monitor, continuous pulse oximetry, blood pressure cuff and oxygen [place] (2L); EKG performed; sheet provided and blanket given

## 2011-04-08 NOTE — ED Notes (Signed)
Pt being moved to room 18

## 2011-04-08 NOTE — ED Notes (Signed)
Patient resting comfortably on stretcher states chest pain with shortness of breath 3 days ago.  Currently chest pressure 3/10 feels better.  Airway intact bilateral equal chest rise and fall. Call light in reach and given phone to patient

## 2011-04-08 NOTE — ED Provider Notes (Signed)
History    65yF with CP and dyspnea. Associated with nausea. Substernal with no radiation. Currently resolved. No fever or chills. No cough. No unusual swelling. Multiple risk factors including known cad, dm, htn. CSN: [id number] Arrival date & time: 04/08/2011  8:44 AM   First MD Initiated Contact with Patient 04/08/11 [phone]      Chief Complaint  Patient presents with  . Chest Pain    (Consider location/radiation/quality/duration/timing/severity/associated sxs/prior treatment) HPI  Past Medical History  Diagnosis Date  . ESRD (end stage renal disease) on dialysis   . Hypertension   . Diabetes mellitus   . Anxiety   . Hypothyroid   . Fibromyalgia   . CHF (congestive heart failure)   . Diabetic retinopathy   . PAD (peripheral artery disease)     s/p L BKA  . Diabetic retinopathy   . Major depression   . Morphea   . Osteoporosis   . Peripheral neuropathy   . Carpal tunnel syndrome, bilateral   . Secondary hyperparathyroidism   . Vitamin D deficiency   . Refusal of blood transfusions as patient is Jehovah's Witness   . CAD (coronary artery disease)     s/p MI x 2, s/p stent x 4  . PONV (postoperative nausea and vomiting)   . Hyperlipidemia   . Asthma     "haven't had it since 1970's"  . Migraines   . Constipation   . Hemorrhoids   . Dialysis patient     [facility]; Mon, Wed, Friday    Past Surgical History  Procedure Date  . 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 Cardiac catheterization 2006, 2007    pt had 4 stents placed  . Cholecystectomy 1987  . Left hip hemiarthroplasty 10/2009  . Vaginal hysterectomy 1994    with BSO  . Cataract extraction, bilateral 2000  . Peripheral arterial stent graft     bilaterally "(left ~ 2010; right ~ 2011"  . Av fistula placement ~ 2011    right upper arm    Family History  Problem Relation Age of Onset  . Alcohol abuse Father   . Multiple myeloma Mother   . Diabetes type II Mother   . Hypertension Mother   .  Appendicitis Brother     History  Substance Use Topics  . Smoking status: Former Smoker -- 2.0 packs/day for 30 years    Types: Cigarettes    Quit date: 04/07/1989  . Smokeless tobacco: Never Used  . Alcohol Use: 0.0 oz/week     "socially; I'm not an alcoholic"    OB History    Grav Para Term Preterm Abortions TAB SAB Ect Mult Living                  Review of Systems   Review of symptoms negative unless otherwise noted in HPI.   Allergies  Other; Pumpkin seed oil-saw palmetto-zinc; Shrimp; and Penicillins  Home Medications   Current Outpatient Rx  Name Route Sig Dispense Refill  . ALPRAZOLAM 0.25 MG PO TABS Oral Take 0.5 mg by mouth at bedtime as needed.     . ASPIRIN 81 MG PO TABS Oral Take 81 mg by mouth daily.      Marland Kitchen DOCUSATE SODIUM 100 MG PO CAPS Oral Take 100 mg by mouth daily.      Marland Kitchen FLUOXETINE HCL 20 MG PO CAPS Oral Take 20 mg by mouth daily.      Marland Kitchen DIALYVITE 3000 3  MG PO TABS Oral Take 1 tablet by mouth daily.      Marland Kitchen HYDROCODONE-ACETAMINOPHEN 5-500 MG PO CAPS Oral Take 1 capsule by mouth 3 (three) times daily as needed.     Marland Kitchen LEVOTHYROXINE SODIUM 112 MCG PO TABS Oral Take 112 mcg by mouth daily.      Marland Kitchen LOVASTATIN 10 MG PO TABS Oral Take 10 mg by mouth at bedtime.      Marland Kitchen METOPROLOL SUCCINATE 25 MG PO TB24 Oral Take 25 mg by mouth daily.      . OXYCODONE-ACETAMINOPHEN 5-325 MG PO TABS Oral Take 1 tablet by mouth every 6 (six) hours as needed. Take one or two tablet every 6 hours     . PROMETHAZINE HCL 25 MG PO TABS Oral Take 25 mg by mouth every 8 (eight) hours as needed.      Marland Kitchen LISINOPRIL 20 MG PO TABS Oral Take 1.5 tablets (30 mg total) by mouth daily. 60 tablet 0    BP 165/79  Pulse 67  Temp(Src) 97.8 F (36.6 C) (Oral)  Resp 22  Ht 5\' 8"  (1.727 m)  Wt 177 lb 14.6 oz (80.7 kg)  BMI 27.05 kg/m2  SpO2 96%  Physical Exam  Nursing note and vitals reviewed. Constitutional: She appears well-developed and well-nourished. No distress.  HENT:  Head:  Normocephalic and atraumatic.  Eyes: Conjunctivae are normal. Right eye exhibits no discharge. Left eye exhibits no discharge.  Neck: Neck supple.  Cardiovascular: Normal rate, regular rhythm and normal heart sounds.  Exam reveals no gallop and no friction rub.   No murmur heard. Pulmonary/Chest: Effort normal and breath sounds normal. No respiratory distress.  Abdominal: Soft. She exhibits no distension. There is no tenderness.  Musculoskeletal: She exhibits no edema and no tenderness.  Neurological: She is alert.  Skin: Skin is warm and dry.  Psychiatric: She has a normal mood and affect. Her behavior is normal. Thought content normal.    ED Course  Procedures (including critical care time)  Labs Reviewed  CBC - Abnormal; Notable for the following:    RDW 16.3 (*)    Platelets 140 (*)    All other components within normal limits  BASIC METABOLIC PANEL - Abnormal; Notable for the following:    Glucose, Bld 110 (*)    Creatinine, Ser 3.93 (*)    GFR calc non Af Amer 11 (*)    GFR calc Af Amer 13 (*)    All other components within normal limits  PRO B NATRIURETIC PEPTIDE - Abnormal; Notable for the following:    BNP, POC >70000.0 (*)    All other components within normal limits  CBC - Abnormal; Notable for the following:    Hemoglobin 11.8 (*)    RDW 16.3 (*)    All other components within normal limits  RENAL FUNCTION PANEL - Abnormal; Notable for the following:    Sodium 132 (*)    Chloride 93 (*)    Glucose, Bld 146 (*)    BUN 33 (*)    Creatinine, Ser 5.26 (*)    Albumin 3.2 (*)    GFR calc non Af Amer 8 (*)    GFR calc Af Amer 9 (*)    All other components within normal limits  GLUCOSE, CAPILLARY - Abnormal; Notable for the following:    Glucose-Capillary 148 (*)    All other components within normal limits  TROPONIN I  CARDIAC PANEL(CRET KIN+CKTOT+MB+TROPI)  CARDIAC PANEL(CRET KIN+CKTOT+MB+TROPI)  MRSA PCR SCREENING  CARDIAC  PANEL(CRET KIN+CKTOT+MB+TROPI)    GLUCOSE, CAPILLARY   No results found.  EKG:  Rhythm: normal sinus Rate:  68 Axis: normal Intervals: QRS 118. New q wave in III as compared to previous. ST segments: nonspecific ST changes.  Mild nondiagnostic ST elevation v1v/2 with flattening/slight depression laterally.Likely repol abnormalities related to LVH. Noted on previous.   1. ESRD (end stage renal disease) on dialysis   2. CAD, multiple vessel   3. Hypertension       MDM  65yf with CP. ekg nondiagnostic.  Trop wnl. multipel risk factors. Will admit for further eval and acs r/o.        Raeford Razor, MD 04/11/11 [phone]

## 2011-04-08 NOTE — ED Notes (Signed)
Pt returned from out of the dept; Pt placed back on monitor, continuous pulse oximetry, blood pressure cuff and oxygen [place] (2L)

## 2011-04-08 NOTE — ED Notes (Signed)
EKG given to [place] M.D at 8:58 A.M

## 2011-04-08 NOTE — H&P (Signed)
Hospital Admission Note Date: 04/08/2011  Patient name: [name] Medical record number: [id number] Date of birth: Nov 08, 1945 Age: 65 y.o. Gender: female PCP: Thane Edu, MD  Medical Service: [facility] Internal Medicine Teaching Service  Attending physician: Dr. Johny Sax    1st Contact: Dr. Vernice Jefferson  Pager: [phone] 2nd Contact: Dr. Almyra Deforest  Pager: [phone] After 5 pm or weekends: 1st Contact:      Pager: [phone] 2nd Contact:      Pager: [phone]  Chief Complaint: Chest Pain  History of Present Illness: Patient is a very pleasant 65 yo woman that presents with acute onset 10/10 sharp, mid-chest pain at 6:30am on the morning of admission that woke her from sleep.  She notes that pain radiated to b/l arms, and decreased to 6/10 after receiving NTG & morphine in the ED.  She notes that the pain is similar to when she had an MI in the past.  Pain does not have any relation to food, as she has not had an appetite for the past 4 days.  She does note some difficulty with swallowing water.  She denies pain or dyspnea on exertion.  She reports that 3 days PTA she began to feel weak with decreased appetite & some mild nausea, as if a cold was coming on.  2 days PTA she notes a heavy pressure on her chest that she attributed to fibromyalgia.  One day PTA, about 1 hour into HD, she felt SOB and "panicy" that resolved with O2.  She also had 1 episode of yellow, nonbloody, nonblack vomitus.  She reports nonspecific, epigastric abdominal pain after vomiting.  She denies recent cough, congestion or sick contacts.    Meds: Medications Prior to Admission  Medication Dose Route Frequency Provider Last Rate Last Dose  . 0.9 %  sodium chloride infusion   Intravenous STAT Raeford Razor, MD      . ondansetron [facility]) 4 MG/2ML injection        4 mg at 04/08/11 0830  . ondansetron (ZOFRAN) injection 4 mg  4 mg Intravenous Q8H PRN Raeford Razor, MD       Outpatient  Medications: Lisinopril 20mg  daily Prozac 20mg  daily Dialyvite tablet 1 tab daily Metoprolol ER 25mg  daily ASA 81 mg daily Colace 100mg  daily Levothyroxine daily Tums #150 Lovastatin 10mg  daily Xanax 0.25, 2 tab daily as needed Tessalon 200mg , 1 capsule by mouth TID prn cough Vicodin 5/500, 1 tab 2-3 times daily as needed for pain Percocet 5/325mg , 1-2 tabs by mouth q6h prn pain Phenergan 25mg  TID prn    Allergies: Other; Pumpkin seed oil-saw palmetto-zinc; Shrimp; and Penicillins; PCN causes tongue swelling; pumpkin seed oil-saw palmetto-Zn and shrimp causes anaphylaxis  Past Medical History  Diagnosis Date  . ESRD (end stage renal disease) on dialysis   . Hypertension   . Diabetes mellitus   . Anxiety   . Hypothyroid   . Fibromyalgia   . CHF (congestive heart failure)   . Diabetic retinopathy   . PAD (peripheral artery disease)     s/p L BKA  . Diabetic retinopathy   . Major depression   . Morphea   . Osteoporosis   . Peripheral neuropathy   . Carpal tunnel syndrome, bilateral   . Secondary hyperparathyroidism   . Vitamin D deficiency   . Refusal of blood transfusions as patient is Jehovah's Witness   . CAD (coronary artery disease)     s/p MI x 2, s/p stent x 4  Past Surgical History  Procedure Date  . Leg amputation below knee     10/2008  . Cardiac catheterization 2006, 2007    pt had 4 stents placed  . Cholecystectomy 1987  . Left hip hemiarthroplasty 10/2009  . Vaginal hysterectomy 1994    with BSO  . Cataract extraction, bilateral 2000   Family History  Problem Relation Age of Onset  . Alcohol abuse Father   . Multiple myeloma Mother   . Diabetes type II Mother   . Hypertension Mother   . Appendicitis Brother    History   Social History  . Marital Status: Divorced    Spouse Name: N/A    Number of Children: N/A  . Years of Education: N/A   Occupational History  . retired     was Administrator, disabled since 2006   Social History  Main Topics  . Smoking status: Former Smoker -- 2.0 packs/day for 30 years    Types: Cigarettes    Quit date: 04/07/1989  . Smokeless tobacco: Not on file  . Alcohol Use: No  . Drug Use: No  . Sexually Active: Not on file   Other Topics Concern  . Not on file   Social History Narrative   Lives with her mother who is ill with lung CA in remission.  The two live in her apartment.  She is a Scientist, product/process development.  Enjoys reading, collecting dolls, and working on computer.   Review of Systems: General: no fevers, chills, changes in weight Skin: no rash HEENT: no blurry vision, hearing changes, sore throat Pulm: no dyspnea, coughing, wheezing CV: as per HPI Abd:no diarrhea/constipation, remaining as per HPI GU: no dysuria, hematuria, polyuria Ext: no new arthralgias, myalgias Neuro: no weakness, numbness, or tingling  Physical Exam: Blood pressure 201/93, pulse 69, temperature 97.9 F (36.6 C), temperature source Oral, resp. rate 18, SpO2 98.00%. General: resting in bed, pleasant, conversant  HEENT: PERRL, EOMI, no scleral icterus, no conjunctival pallor Cardiac: RRR, no rubs, murmurs or gallops, mid chest tender to palpation (but feels different than sharp pain on the morning of admission) Pulm: clear to auscultation bilaterally, but poor air mvmt, normal work of breathing Abd: soft, mild epigastric tenderness to palpation, nondistended, BS normoactive Ext: warm and well perfused, no pedal edema, Left BKA, right foot onychomyosis, small scattered healing ulcers of RLE (<10mm), bruising of RUE fistula access, Scars on left LE at amputation and on right LE above and below popliteal region, healed and nonerythematous. Neuro: alert and oriented X3, cranial nerves II-XII grossly intact, strength equal in bilateral upper and lower extremities, Right LE sensation impaired to light touch at foot.  Lab results: Most recent outpatient labs from [facility]: A1c 5.9% Oct 15, 2010,  Total Cholesterol was  154, triglycerides 125, HDL 52, LDL 77 all done on 10/10/2010.  CMP was normal that date except for a creatinine of 4.62 and a glucose of 125.  CK was normal at 51.  She had a normal fistulogram in April 2012.  Her TSH was normal in March of 2012. PTH was 119 in March of 2012.  Most recently he saw Dr. Darrick Penna, vascular surgeon, in April 2012.  Basic Metabolic Panel:  [facility] 04/08/11 0902  NA 136  K 3.6  CL 97  CO2 24  GLUCOSE 110*  BUN 23  CREATININE 3.93*  CALCIUM 8.9  MG --  PHOS --   CBC:  Basename 04/08/11 0902  WBC 7.3  NEUTROABS --  HGB 12.1  HCT 39.3  MCV 95.2  PLT 140*   Cardiac Enzymes:  Basename 04/08/11 0909  CKTOTAL --  CKMB --  CKMBINDEX --  TROPONINI <0.30   EKG: NSR, VR-68bpm, PR int-113ms, QRS-118, QT/QTc-468/498, probably LVH  Imaging results:  Dg Chest 2 View  04/08/2011  *RADIOLOGY REPORT*  Clinical Data: Chest pain, shortness of breath, history of kidney failure  CHEST - 2 VIEW  Comparison: Portable chest x-ray of 04/09/2010  Findings: No active infiltrate or effusion is seen.  There may be mild pulmonary vascular congestion present.  Cardiomegaly is stable.  The left central venous line noted previously is no longer seen.  There are degenerative changes throughout the thoracic spine.  IMPRESSION: Cardiomegaly.  Question mild pulmonary vascular congestion.  Original Report Authenticated By: Juline Patch, M.D.   Assessment & Plan by Problem: Ms. Galindez is a pleasant 65 yo female with a history of CAD s/p stenting in 2006 and ESRD secondary to DM who presented to the ED this morning with substernal chest pain radiating to her back and arms. We will admit her to telemetry in order to control her pain and blood pressure and in order to evaluate chest pain.   # Chest pain- Given patient's PMH, of most concern is acute MI.  Patient reports chest pain partially relieved by nitroglycerin and morphine.  First set of CE were negative, and EKG was not  suggestive of acute MI.    Also, given history of MI with stenting, unstable angina is a possibility.  With history of CHF and ESRD, fluid overload may be causing CHF exacerbation.  She notes that she has been compliant with HD, but it is possible that not enough fluid was removed.  On physical exam, chest was TTP, and thus MSK etiology cannot be ruled out. Less likely is aortic dissection since CXR shows no mediastinal widening.  Patient denies cough/fever, does not exhibit leukocytosis and CXR does not suggest consolidation, and thus PNA is not likely.  Patient's revised geneva score is 0, and thus there is low probability of PE. -Admit to telemetry  -prn pain medication - morphine 1mg  q4h prn pain -NTG 0.4mg  SL q26min PRN CP x 3, then notify MD -Aspirin 81mg   -Repeat EKG in the morning, BNP with AM labs, cycle CE  -May consider TTE to further evaluate CHF  # HTN- Past medical records note average blood pressures of 165/95, with a goal of less than 140/90. Her BP in the ED was 201/93, so we will continue her home regimen of lisinopril and metoprolol. We will closely follow her BPs and add additional medications if needed.  - Will continue home dose of Lisinopril 20mg  po daily and Metoprolol ER 25mg  po daily   # ESRD- Patient currently undergoes dialysis 3 times a week (MWF).  -Continue home Dialyvite 1 tablet po daily, Renal was consulted to dialyze patient tomorrow  # CAD- Patient is on aspirin, ACE inhibitor, beta-blocker, and statin. We will continue these medications. Patient's lipid panel performed in May was within normal limits, so we will not repeat this.  -Consider TTE to evaluate CHF  -Continue Lovastatin 10mg  po qHS, continue ASA 81   #CHF: Last 2D echo was in 2010, EF 60%.  May be contributing to current symptoms. Plan: Repeat 2D echo  # DM- Well controlled by diet only. Last HgbA1C <6 in May, so we will not repeat this. We will not check CBGs in house.   #  Depression/Anxiety/Fibromyalgia- Stable. Continue home  medications.  -Prozac 20mg  po daily  -Xanax 0.5mg  po daily prn  -Pain control   # S/p left BKA- Stable. Patient is receiving PT through [facility], so we will not recommend additional PT evaluation while inpatient.   # Hypothyroidism- Stable. [facility] will recheck TSH March 2013.  -Continue home levothyroxine po daily on empty stomach.   #VTE Prophylaxis: Heparin 5000u TID  SignedVernice Jefferson 04/08/2011, 4:06 PM  Internal Medicine Teaching Service Attending Note Date: 04/09/2011  Patient name: [name]  Medical record number: [id number]  Date of birth: [birth year]    This patient has been seen and discussed with the house staff. Please see their note for complete details. I concur with their findings with the following additions/corrections: none.  Please see my note for any additional comments  Johny Sax 04/09/2011, 9:39 AM

## 2011-04-08 NOTE — ED Notes (Signed)
Admit Doctors at bedside. 

## 2011-04-08 NOTE — ED Notes (Signed)
Pt resting; assisted with making a phone call; no needs at this time

## 2011-04-08 NOTE — Progress Notes (Signed)
Internal Medicine Teaching Service Attending Note Date: 04/08/2011  Patient name: [name]  Medical record number: [id number]  Date of birth: 07-Sep-1945   I have seen and evaluated Melodie Bouillon and discussed their care with the Residency Team.   65 yo F with a hx of MI and 4 stents placed 2006, ESRD and DM2 for 19 years (currently diet controlled). She began to feel poorly (like she had a cold/weakenss/achiness 04-05-11).04-06-11 she felt some heaviness in her chest.  At dialysis 04-07-11 she developed chest heaviness and some nausea.  This am she was awoken by knife like CP that she rated as 10/10. The pain radited into her back and arms. She had partial relief in the ED with NTG, more relief with morphine (but still 6/10).   ROS- no cough, no sick exposures. No problems with her LUE fistula.   Physical Exam: Blood pressure 188/94, pulse 68, temperature 97.9 F (36.6 C), temperature source Oral, resp. rate 18, SpO2 100.00%. BP 188/94  Pulse 68  Temp(Src) 97.9 F (36.6 C) (Oral)  Resp 18  SpO2 100%  General Appearance:    Alert, cooperative, no distress, appears stated age  Head:    Normocephalic, without obvious abnormality, atraumatic  Eyes:    PERRL, conjunctiva/corneas clear, EOM's intact         Throat:  mild whitish material on tongue.   Neck:   Supple, symmetrical, trachea midline, no adenopathy;    thyroid:  no tenderness; JVD+  Back:     Symmetric, no curvature,  Lungs:     Rales bilaterally, respirations unlabored  Chest Wall:   she has mild tenderness with palpation of her sternum, she states that this is different than the CP she had previously.    Heart:    Regular rate and rhythm, S1 and S2 normal, no murmur, rub   or gallop     Abdomen:     Soft, non-tender, bowel sounds active all four quadrants,          Extremities:   RLE 3+ edema, L BKA well healed. RUE fistula with thrill. Non-functioning AVG LUE.      Skin:   Diffuse darkening of skin.   Lymph  nodes:   No palpable cervical nodes.  Neurologic:   Absent light touch in RLE.     Lab results: Results for orders placed during the hospital encounter of 04/08/11 (from the past 24 hour(s))  CBC     Status: Abnormal   Collection Time   04/08/11  9:02 AM      Component Value Range   WBC 7.3  4.0 - 10.5 (K/uL)   RBC 4.13  3.87 - 5.11 (MIL/uL)   Hemoglobin 12.1  12.0 - 15.0 (g/dL)   HCT 40.9  81.1 - 91.4 (%)   MCV 95.2  78.0 - 100.0 (fL)   MCH 29.3  26.0 - 34.0 (pg)   MCHC 30.8  30.0 - 36.0 (g/dL)   RDW 78.2 (*) 95.6 - 15.5 (%)   Platelets 140 (*) 150 - 400 (K/uL)  BASIC METABOLIC PANEL     Status: Abnormal   Collection Time   04/08/11  9:02 AM      Component Value Range   Sodium 136  135 - 145 (mEq/L)   Potassium 3.6  3.5 - 5.1 (mEq/L)   Chloride 97  96 - 112 (mEq/L)   CO2 24  19 - 32 (mEq/L)   Glucose, Bld 110 (*) 70 - 99 (mg/dL)  BUN 23  6 - 23 (mg/dL)   Creatinine, Ser 1.47 (*) 0.50 - 1.10 (mg/dL)   Calcium 8.9  8.4 - 82.9 (mg/dL)   GFR calc non Af Amer 11 (*) >90 (mL/min)   GFR calc Af Amer 13 (*) >90 (mL/min)  TROPONIN I     Status: Normal   Collection Time   04/08/11  9:09 AM      Component Value Range   Troponin I <0.30  <0.30 (ng/mL)    Imaging results:  Dg Chest 2 View  04/08/2011  *RADIOLOGY REPORT*  Clinical Data: Chest pain, shortness of breath, history of kidney failure  CHEST - 2 VIEW  Comparison: Portable chest x-ray of 04/09/2010  Findings: No active infiltrate or effusion is seen.  There may be mild pulmonary vascular congestion present.  Cardiomegaly is stable.  The left central venous line noted previously is no longer seen.  There are degenerative changes throughout the thoracic spine.  IMPRESSION: Cardiomegaly.  Question mild pulmonary vascular congestion.  Original Report Authenticated By: Juline Patch, M.D.    Assessment and Plan: I agree with the formulated Assessment and Plan with the following changes:  Chest Pain- will watch her on  telemetry, continue her ASA, follow her CPKs/Troponins; will improve her BP control; consider getting a BNP and TTE to eval her EF with her edema and rales. Her recent lipid panel showed a chol of <200, will continue her statin. She may need CV eval.  DM- will follow CBGs.  ESRD- will ask Renal if she can continue her HD while in hospital.

## 2011-04-08 NOTE — ED Notes (Signed)
Per ems- pt awoke with substernal chest pain beginning this morning around 6:45, awoke pt from sleep. Pt was dyspneic and nauseous. Pt stated taht she had dialysis yesterday and had become very short of breath during treatment and was placed on O2 but her symptoms were relievd. With EMS pt had 4mg  morphine, 2 SL nitro, 4mg  Zofran. Pt took 325 aspirin before EMS arrived. Pt now rates pain as 4/10. Pt has Left BKA.

## 2011-04-09 ENCOUNTER — Inpatient Hospital Stay (HOSPITAL_COMMUNITY): Payer: Medicare (Managed Care)

## 2011-04-09 ENCOUNTER — Other Ambulatory Visit: Payer: Self-pay

## 2011-04-09 LAB — RENAL FUNCTION PANEL
Albumin: 3.2 g/dL — ABNORMAL LOW (ref 3.5–5.2)
CO2: 27 mEq/L (ref 19–32)
Calcium: 8.8 mg/dL (ref 8.4–10.5)
GFR calc Af Amer: 9 mL/min — ABNORMAL LOW (ref >90)
GFR calc non Af Amer: 8 mL/min — ABNORMAL LOW (ref >90)
Phosphorus: 2.6 mg/dL (ref 2.3–4.6)
Sodium: 132 mEq/L — ABNORMAL LOW (ref 135–145)

## 2011-04-09 LAB — CBC
MCH: 29.6 pg (ref 26.0–34.0)
MCHC: 31.1 g/dL (ref 30.0–36.0)
Platelets: 156 10*3/uL (ref 150–400)
RBC: 3.99 MIL/uL (ref 3.87–5.11)

## 2011-04-09 LAB — GLUCOSE, CAPILLARY: Glucose-Capillary: 86 mg/dL (ref 70–99)

## 2011-04-09 LAB — CARDIAC PANEL(CRET KIN+CKTOT+MB+TROPI)
CK, MB: 3 ng/mL (ref 0.3–4.0)
CK, MB: 3.1 ng/mL (ref 0.3–4.0)
Relative Index: INVALID (ref 0.0–2.5)
Relative Index: INVALID (ref 0.0–2.5)
Total CK: 37 U/L (ref 7–177)
Total CK: 33 U/L (ref 7–177)
Troponin I: <0.3 ng/mL (ref <0.30)
Troponin I: <0.3 ng/mL (ref <0.30)

## 2011-04-09 MED ORDER — ACETAMINOPHEN 325 MG PO TABS: 650.0000 mg | ORAL_TABLET | Freq: Four times a day (QID) | ORAL | Status: DC | PRN

## 2011-04-09 MED ORDER — SORBITOL 70 % SOLN
30.0000 mL | Status: DC | PRN
  Filled 2011-04-09: qty 30

## 2011-04-09 MED ORDER — CALCIUM CARBONATE ANTACID 500 MG PO CHEW
3.0000 | CHEWABLE_TABLET | Freq: Three times a day (TID) | ORAL | Status: DC
  Administered 2011-04-10 (×2): 600 mg via ORAL
  Filled 2011-04-09 (×4): qty 3

## 2011-04-09 MED ORDER — SODIUM CHLORIDE 0.9 % IV SOLN: 100.0000 mL | INTRAVENOUS | Status: DC | PRN

## 2011-04-09 MED ORDER — HYDROXYZINE HCL 25 MG PO TABS
ORAL_TABLET | ORAL | Status: AC
  Administered 2011-04-09: 25 mg via ORAL
  Filled 2011-04-09: qty 1

## 2011-04-09 MED ORDER — RENA-VITE PO TABS
1.0000 | ORAL_TABLET | Freq: Every day | ORAL | Status: DC
  Administered 2011-04-09: 1 via ORAL
  Filled 2011-04-09 (×2): qty 1

## 2011-04-09 MED ORDER — PROMETHAZINE HCL 25 MG PO TABS: 12.5000 mg | ORAL_TABLET | Freq: Four times a day (QID) | ORAL | Status: DC | PRN

## 2011-04-09 MED ORDER — PENTAFLUOROPROP-TETRAFLUOROETH EX AERO: 1.0000 "application " | INHALATION_SPRAY | CUTANEOUS | Status: DC | PRN

## 2011-04-09 MED ORDER — DIPHENHYDRAMINE HCL 25 MG PO CAPS
25.0000 mg | ORAL_CAPSULE | Freq: Once | ORAL | Status: AC
  Administered 2011-04-10: 25 mg via ORAL
  Filled 2011-04-09: qty 1

## 2011-04-09 MED ORDER — HEPARIN SODIUM (PORCINE) 1000 UNIT/ML DIALYSIS
100.0000 [IU]/kg | INTRAMUSCULAR | Status: DC | PRN
  Filled 2011-04-09: qty 9

## 2011-04-09 MED ORDER — ACETAMINOPHEN 650 MG RE SUPP: 650.0000 mg | Freq: Four times a day (QID) | RECTAL | Status: DC | PRN

## 2011-04-09 MED ORDER — ZOLPIDEM TARTRATE 5 MG PO TABS: 5.0000 mg | ORAL_TABLET | Freq: Every evening | ORAL | Status: DC | PRN

## 2011-04-09 MED ORDER — PROMETHAZINE HCL 25 MG RE SUPP: 25.0000 mg | Freq: Two times a day (BID) | RECTAL | Status: DC | PRN

## 2011-04-09 MED ORDER — CALCIUM CARBONATE 1250 MG/5ML PO SUSP
500.0000 mg | Freq: Four times a day (QID) | ORAL | Status: DC | PRN
  Filled 2011-04-09: qty 5

## 2011-04-09 MED ORDER — ALTEPLASE 2 MG IJ SOLR
2.0000 mg | Freq: Once | INTRAMUSCULAR | Status: AC | PRN
  Filled 2011-04-09: qty 2

## 2011-04-09 MED ORDER — PARICALCITOL 5 MCG/ML IV SOLN
INTRAVENOUS | Status: AC
  Administered 2011-04-09: 4 ug via INTRAVENOUS
  Filled 2011-04-09: qty 1

## 2011-04-09 MED ORDER — ALPRAZOLAM 0.5 MG PO TABS
ORAL_TABLET | ORAL | Status: AC
  Administered 2011-04-09: 0.5 mg via ORAL
  Filled 2011-04-09: qty 1

## 2011-04-09 MED ORDER — LIDOCAINE-PRILOCAINE 2.5-2.5 % EX CREA
1.0000 "application " | TOPICAL_CREAM | CUTANEOUS | Status: DC | PRN
  Filled 2011-04-09: qty 5

## 2011-04-09 MED ORDER — DIPHENHYDRAMINE HCL 25 MG PO CAPS
25.0000 mg | ORAL_CAPSULE | Freq: Once | ORAL | Status: AC
Start: 2011-04-09 — End: 2011-04-09
  Administered 2011-04-09: 25 mg via ORAL
  Filled 2011-04-09: qty 1

## 2011-04-09 MED ORDER — HYDROXYZINE HCL 25 MG PO TABS
25.0000 mg | ORAL_TABLET | Freq: Three times a day (TID) | ORAL | Status: DC | PRN
  Administered 2011-04-09: 25 mg via ORAL
  Filled 2011-04-09 (×2): qty 1

## 2011-04-09 MED ORDER — PARICALCITOL 5 MCG/ML IV SOLN
4.0000 ug | INTRAVENOUS | Status: DC
  Administered 2011-04-09: 4 ug via INTRAVENOUS
  Filled 2011-04-09 (×2): qty 0.8

## 2011-04-09 MED ORDER — HEPARIN SODIUM (PORCINE) 1000 UNIT/ML DIALYSIS
1000.0000 [IU] | INTRAMUSCULAR | Status: DC | PRN
  Filled 2011-04-09: qty 1

## 2011-04-09 MED ORDER — DOCUSATE SODIUM 283 MG RE ENEM
1.0000 | ENEMA | RECTAL | Status: DC | PRN
  Filled 2011-04-09: qty 1

## 2011-04-09 MED ORDER — ALPRAZOLAM 0.5 MG PO TABS
0.5000 mg | ORAL_TABLET | Freq: Once | ORAL | Status: AC
  Administered 2011-04-09: 0.5 mg via ORAL
  Filled 2011-04-09: qty 1

## 2011-04-09 MED ORDER — LIDOCAINE HCL (PF) 1 % IJ SOLN: 5.0000 mL | INTRAMUSCULAR | Status: DC | PRN

## 2011-04-09 MED ORDER — FOLIC ACID 1 MG PO TABS
1.0000 mg | ORAL_TABLET | Freq: Every day | ORAL | Status: DC
  Administered 2011-04-10: 1 mg via ORAL
  Filled 2011-04-09 (×3): qty 1

## 2011-04-09 MED ORDER — CAMPHOR-MENTHOL 0.5-0.5 % EX LOTN
1.0000 "application " | TOPICAL_LOTION | Freq: Three times a day (TID) | CUTANEOUS | Status: DC | PRN
  Filled 2011-04-09: qty 222

## 2011-04-09 NOTE — Consult Note (Signed)
[facility] Renal Consultation Note  Indication for Consultation:  Management of ESRD/hemodialysis; anemia, hypertension/volume and secondary hyperparathyroidism  HPI: [name] is a 65 y.o.female with ESRD secondary to diabetic nephropathy on ND since March 2010. She presented to the ED yesterday after she awoke with chest pain that radiated bilaterally into her arms. Her last dialysis had been 11/12. Her had a net UF of 1.8 liters and BP remained elevated throughout with a post BP weight of 198/104 sitting. There were no documented problems noted, but according to the RN [name]), who I talked with yesterday, said [name], just didn't feel good on Monday.  [name] also gave me the same history as she gave to Dr. Emelda Fear plus she told me she vomited on HD on Monday, which was very unusual for her.  At present, she is without CP or SOB at rest. Dialysis Orders: Center: [facility] on MWF EDW 83.5  HD Bath @ K 2.25 Ca Time 3.75 hrs Heparin std. Access Right upper AVF  BFR 400 DFR A 1.5 Zemplar 4cg IV/HD Epogen none Venofer  noone  Other 36 degrees  Past Medical History  Diagnosis Date  . ESRD (end stage renal disease) on dialysis   . Hypertension   . Diabetes mellitus   . Anxiety   . Hypothyroid   . Fibromyalgia   . CHF (congestive heart failure)   . Diabetic retinopathy   . PAD (peripheral artery disease)     s/p L BKA  . Diabetic retinopathy   . Major depression   . Morphea   . Osteoporosis   . Peripheral neuropathy   . Carpal tunnel syndrome, bilateral   . Secondary hyperparathyroidism   . Vitamin D deficiency   . Refusal of blood transfusions as patient is Jehovah's Witness   . CAD (coronary artery disease)     s/p MI x 2, s/p stent x 4  . PONV (postoperative nausea and vomiting)   . Hyperlipidemia   . Asthma     "haven't had it since 1970's"  . Migraines   . Constipation   . Hemorrhoids   . Dialysis patient     [name]; Mon, Wed, Friday    Past Surgical History  Procedure Date  . 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 Cardiac catheterization 2006, 2007    pt had 4 stents placed  . Cholecystectomy 1987  . Left hip hemiarthroplasty 10/2009  . Vaginal hysterectomy 1994    with BSO  . Cataract extraction, bilateral 2000  . Peripheral arterial stent graft     bilaterally "(left ~ 2010; right ~ 2011"  . Av fistula placement ~ 2011    right upper arm   Family History  Problem Relation Age of Onset  . Alcohol abuse Father   . Multiple myeloma Mother   . Diabetes type II Mother   . Hypertension Mother   . Appendicitis Brother   Social History:  reports that she quit smoking about 22 years ago. Her smoking use included Cigarettes. She has a 60 pack-year smoking history. She has never used smokeless tobacco. She reports that she drinks alcohol. She reports that she does not use illicit drugs. Her 30 year old mother lives with her. [place] 21 years ago.   Allergies  Allergen Reactions  . Other Anaphylaxis    peaches  . Pumpkin Seed Oil-Saw Palmetto-Zinc (Propalmex) Anaphylaxis  . Shrimp (Shellfish Allergy) Anaphylaxis  .  Penicillins     Tongue swelling.   Prior to Admission:    Medication Sig Dispense Refill  . ALPRAZolam (XANAX) 0.25 MG tablet Take 0.5 mg by mouth at bedtime as needed.       Marland Kitchen aspirin 81 MG tablet Take 81 mg by mouth daily.        Marland Kitchen docusate sodium (COLACE) 100 MG capsule Take 100 mg by mouth daily.        Marland Kitchen FLUoxetine (PROZAC) 20 MG capsule Take 20 mg by mouth daily.        . folic acid-vitamin b complex-vitamin c-selenium-zinc (DIALYVITE) 3 MG TABS Take 1 tablet by mouth daily.        . hydrocodone-acetaminophen (LORCET-HD) 5-500 MG per capsule Take 1 capsule by mouth 3 (three) times daily as needed.       Marland Kitchen levothyroxine (SYNTHROID, LEVOTHROID) 112 MCG tablet Take 112 mcg by mouth daily.        Marland Kitchen lisinopril (PRINIVIL,ZESTRIL) 20 MG tablet Take 20 mg by mouth  daily.        Marland Kitchen lovastatin (MEVACOR) 10 MG tablet Take 10 mg by mouth at bedtime.        . metoprolol succinate (TOPROL-XL) 25 MG 24 hr tablet Take 25 mg by mouth daily.        Marland Kitchen oxyCODONE-acetaminophen (PERCOCET) 5-325 MG per tablet Take 1 tablet by mouth every 6 (six) hours as needed. Take one or two tablet every 6 hours       . promethazine (PHENERGAN) 25 MG tablet Take 25 mg by mouth every 8 (eight) hours as needed.        Also on tums 750 2 tab ac tid   Labs:  Results for orders placed during the hospital encounter of 04/08/11 (from the past 48 hour(s))  CBC     Status: Abnormal   Collection Time   04/08/11  9:02 AM      Component Value Range Comment   WBC 7.3  4.0 - 10.5 (K/uL)    RBC 4.13  3.87 - 5.11 (MIL/uL)    Hemoglobin 12.1  12.0 - 15.0 (g/dL)    HCT 16.1  09.6 - 04.5 (%)    MCV 95.2  78.0 - 100.0 (fL)    MCH 29.3  26.0 - 34.0 (pg)    MCHC 30.8  30.0 - 36.0 (g/dL)    RDW 40.9 (*) 81.1 - 15.5 (%)    Platelets 140 (*) 150 - 400 (K/uL)   BASIC METABOLIC PANEL     Status: Abnormal   Collection Time   04/08/11  9:02 AM      Component Value Range Comment   Sodium 136  135 - 145 (mEq/L)    Potassium 3.6  3.5 - 5.1 (mEq/L)    Chloride 97  96 - 112 (mEq/L)    CO2 24  19 - 32 (mEq/L)    Glucose, Bld 110 (*) 70 - 99 (mg/dL)    BUN 23  6 - 23 (mg/dL)    Creatinine, Ser 9.14 (*) 0.50 - 1.10 (mg/dL)    Calcium 8.9  8.4 - 10.5 (mg/dL)    GFR calc non Af Amer 11 (*) >90 (mL/min)    GFR calc Af Amer 13 (*) >90 (mL/min)   TROPONIN I     Status: Normal   Collection Time   04/08/11  9:09 AM      Component Value Range Comment   Troponin I <0.30  <0.30 (ng/mL)   PRO  B NATRIURETIC PEPTIDE     Status: Abnormal   Collection Time   04/08/11  4:50 PM      Component Value Range Comment   BNP, POC >70000.0 (*) 0 - 125 (pg/mL)   CARDIAC PANEL(CRET KIN+CKTOT+MB+TROPI)     Status: Normal   Collection Time   04/08/11  6:20 PM      Component Value Range Comment   Total CK 33  7 - 177  (U/L)    CK, MB 2.8  0.3 - 4.0 (ng/mL)    Troponin I <0.30  <0.30 (ng/mL)    Relative Index RELATIVE INDEX IS INVALID  0.0 - 2.5    MRSA PCR SCREENING     Status: Normal   Collection Time   04/08/11  7:38 PM      Component Value Range Comment   MRSA by PCR NEGATIVE  NEGATIVE    CARDIAC PANEL(CRET KIN+CKTOT+MB+TROPI)     Status: Normal   Collection Time   04/09/11  1:50 AM      Component Value Range Comment   Total CK 37  7 - 177 (U/L)    CK, MB 3.0  0.3 - 4.0 (ng/mL)    Troponin I <0.30  <0.30 (ng/mL)    Relative Index RELATIVE INDEX IS INVALID  0.0 - 2.5     ROS as above plus:Anorexia recently, but ate yesterday, no energy, SOB at rest and when supine, not wearing prosthesis yet, because hasn't done the rehab necessary, no fever, chills, diarrhea, dysuria, no new muscle pain or rahses; occasional constipation - takes miralax,     Physical Exam: Filed Vitals:   04/09/11 0500  BP: 124/67  Pulse: 65  Temp: 98.2 F (36.8 C)  Resp: 16     General: NAD.  Sitting on side of bed without )2 HEENT: [place]/AT; no facial edema Eyes: sclera clear, no ptosis Neck: no JVD Heart: RRR no murmur or rub Lungs: crackles at bases, somewhat limited expansion Abdomen: soft + BS nontender Extremities: tr LE edema on right; left BKA no edema Skin: warm and dry; areas of pigment color changes esp back ? vitiligo  Neuro: A & O x3 Dialysis Access: right upper AVF patent with echymosis  Assessment/Plan: 1.Chest pain in pt with CAD/stents with no acute EKG findings or elevated enzymes; some increased fluid per CKR.  She was much more hypertensive in the outpatient setting and can stand to have her EDW lowered; additional work-up per primary service; BNP > 70K. For TTE today 2. ESRD -  HD today per routine; decrease volume. Check labs. 3 Hypertension/volume  - BP increased yesterday. Normal this am;  4. Anemia  - no ESA for now; Hgb > 12 5. Metabolic bone disease -  Continue usual out patient meds 6.  Nutrition - renal diet 7. DM - diet controlled 8. Hypothyroidism 9. PVD s/p left BKA 10. Depression - usual meds  Sheffield Slider, PA-C [facility] Beeper [phone] 04/09/2011, 10:16 AM   Attending Nephrologist: Dr. Terrial Rhodes  Addendum:  Was given BP meds this am.  Usually takes after HD MWF.  Sheffield Slider, PA-C [facility]  I have seen and examined this patient and agree with the plan of care. Patient was seen and examined while on dialysis.  Nillie Bartolotta A 04/09/2011, 3:11 PM

## 2011-04-09 NOTE — Progress Notes (Signed)
Subjective: Patient reports chest pain has resolved. Denies shortness of breath. Overnight she became very anxious and itchy, and was calmed by Benadryl and Xanax. Pt reports that she sometimes has "panic attacks" at home, which are relieved by Xanax. HD especially makes her nervous.  Objective: Vital signs in last 24 hours: Filed Vitals:   04/09/11 1430 04/09/11 1500 04/09/11 1530 04/09/11 1600  BP: 186/85 185/84 184/90 115/90  Pulse: 61 58 60 61  Temp:      TempSrc:      Resp: 14 14 14 17   Height:      Weight:      SpO2:       Weight change:   Intake/Output Summary (Last 24 hours) at 04/09/11 1609 Last data filed at 04/09/11 1341  Gross per 24 hour  Intake    603 ml  Output      0 ml  Net    603 ml   Physical Exam: General: Awake, lying in bed, in no acute distress  CV: RRR, no murmurs  Resp: CTAB, normal work of breathing, poor air movement  Abd: Normoactive BS. Soft, nontender, nondistended.  Ext: Warm and well-perfused. Minimal right pretibial edema. Left BKA.  Neuro: Oriented x3. CNs grossly intact. Right LE sensation impaired to light touch at foot, intact above ankle. Right LE sensation intact to deep pressure throughout.  Lab Results: Basic Metabolic Panel:  Lab 04/09/11 0981 04/08/11 0902  NA 132* 136  K 3.5 3.6  CL 93* 97  CO2 27 24  GLUCOSE 146* 110*  BUN 33* 23  CREATININE 5.26* 3.93*  CALCIUM 8.8 8.9  MG -- --  PHOS 2.6 --   Liver Function Tests:  Lab 04/09/11 1419  AST --  ALT --  ALKPHOS --  BILITOT --  PROT --  ALBUMIN 3.2*   CBC:  Lab 04/09/11 1419 04/08/11 0902  WBC 6.5 7.3  NEUTROABS -- --  HGB 11.8* 12.1  HCT 38.0 39.3  MCV 95.2 95.2  PLT 156 140*   Cardiac Enzymes:  Lab 04/09/11 1031 04/09/11 0150 04/08/11 1820  CKTOTAL 33 37 33  CKMB 3.1 3.0 2.8  CKMBINDEX -- -- --  TROPONINI <0.30 <0.30 <0.30   BNP:  Lab 04/08/11 1650  POCBNP >70000.0*   CBG:  Lab 04/09/11 1119  GLUCAP 86   Micro Results: Recent Results  (from the past 240 hour(s))  MRSA PCR SCREENING     Status: Normal   Collection Time   04/08/11  7:38 PM      Component Value Range Status Comment   MRSA by PCR NEGATIVE  NEGATIVE  Final    Studies/Results: Dg Chest 2 View  04/08/2011  *RADIOLOGY REPORT*  Clinical Data: Chest pain, shortness of breath, history of kidney failure  CHEST - 2 VIEW  Comparison: Portable chest x-ray of 04/09/2010  Findings: No active infiltrate or effusion is seen.  There may be mild pulmonary vascular congestion present.  Cardiomegaly is stable.  The left central venous line noted previously is no longer seen.  There are degenerative changes throughout the thoracic spine.  IMPRESSION: Cardiomegaly.  Question mild pulmonary vascular congestion.  Original Report Authenticated By: Juline Patch, M.D.   Medications: I have reviewed the patient's current medications. Scheduled Meds:   . ALPRAZolam  0.5 mg Oral Once  . aspirin EC  81 mg Oral Daily  . calcium carbonate  3 tablet Oral TID WC  . diphenhydrAMINE  25 mg Oral Once  . FLUoxetine  20 mg Oral Daily  . folic acid  1 mg Oral Daily  . heparin  5,000 Units Subcutaneous Q8H  . levothyroxine  112 mcg Oral Daily  . lisinopril  20 mg Oral Daily  . metoprolol succinate  25 mg Oral Daily  . multivitamin  1 tablet Oral QHS  . paricalcitol  4 mcg Intravenous 3 times weekly  . simvastatin  5 mg Oral q1800  . sodium chloride  3 mL Intravenous Q12H  . DISCONTD: sodium chloride   Intravenous STAT  . DISCONTD: aspirin  81 mg Oral Daily  . DISCONTD: folic acid-vitamin b complex-vitamin c-selenium-zinc  1 tablet Oral Daily   Continuous Infusions:   . sodium chloride     PRN Meds:.sodium chloride, sodium chloride, acetaminophen, acetaminophen, ALPRAZolam, alteplase, calcium carbonate (dosed in mg elemental calcium), camphor-menthol, docusate sodium, heparin, heparin, hydrOXYzine, lidocaine, lidocaine-prilocaine, morphine injection, nitroGLYCERIN,  pentafluoroprop-tetrafluoroeth, promethazine, promethazine, sodium chloride, sorbitol, zolpidem, DISCONTD: ondansetron (ZOFRAN) IV, DISCONTD: promethazine  Assessment/Plan: Ms. Cumberledge is a pleasant 65 yo female with a history of CAD s/p stenting in 2006 and ESRD secondary to DM who presented to the ED yesterday with substernal chest pain radiating to her back and arms.   # Chest pain- Seems to have resolved today.  Given patient's PMH, of most concern was acute MI, but cardiac enzymes have consistently been negative and EKGs have not been suggestive of acute MI. Given history of MI with stenting, unstable angina is still a possibility. With history of CHF and ESRD, fluid overload may be causing CHF exacerbation. Her BNP was >70,000, but she has reported no SOB or edema. Also likely is CP 2/2 anxiety/panic attack.  On physical exam, chest was TTP, and thus MSK etiology cannot be ruled out.  -Continue prn morphine 1mg  q4h for pain  -Aspirin 81mg   -Will get TTE today to further evaluate CHF   # HTN- Past medical records note average blood pressures of 165/95, with a goal of less than 140/90. Her BP in the ED was 201/93, but she has since dropped to 120s/60s-70s. We will continue her home regimen of lisinopril and metoprolol.  - Will continue home dose of Lisinopril 20mg  po daily and Metoprolol ER 25mg  po daily   # ESRD- Patient currently undergoes dialysis 3 times a week (MWF).  -Continue home Dialyvite 1 tablet po daily  -Patient to be dialyzed today  #Anemia: likely 2/2 ESRD, no c/o black tarry stools or BRBPR, no noticeable blood in urine, no recent hemoptysis/hemetemesis, therefore no concern for acute bleed.  Patient seems to be around baseline.   # CAD- Patient is on aspirin, ACE inhibitor, beta-blocker, and statin. We will continue these medications. Patient's lipid panel performed in May was within normal limits, so we will not repeat this.  -Continue Lovastatin 10mg  po qHS, continue ASA 81     #CHF: Last 2D echo was in 2010, EF 60%. May be contributing to current symptoms.  -Repeat 2D echo   # DM- Well controlled by diet only. Last HgbA1C <6 in May, so we will not repeat this. We will not check CBGs in house.   # Depression/Anxiety/Fibromyalgia- Increased anxiety overnight, relieved by Xanax. We will make sure that she gets Xanax before HD today. Continue home medications.  -Prozac 20mg  po daily  -Xanax 0.5mg  po daily prn  -Pain control   # S/p left BKA- Stable. Patient is receiving PT through [facility], so we will not recommend additional PT evaluation while inpatient.   #  Hypothyroidism- Stable. [facility] will recheck TSH March 2013.  -Continue home levothyroxine po daily on empty stomach.   #VTE Prophylaxis: Heparin 5000u TID  #Dispo: Tomorrow morning    LOS: 1 day   KAPADIA, Kiano Terrien 04/09/2011, 4:09 PM

## 2011-04-09 NOTE — Progress Notes (Signed)
  Internal Medicine Teaching Service Attending Note Date: 04/09/2011  Patient name: [name]  Medical record number: [id number]  Date of birth: [birth year]    This patient has been seen and discussed with the house staff. Please see their note for complete details. I concur with their findings with the following additions/corrections: She feels better this AM. She has decreased chest pain and SOB. Awaiting her TTE. Her BP is better controled today. She has lost 2kg. Will d/i [facility] program re: d/c planning.   Johny Sax 04/09/2011, 9:47 AM

## 2011-04-10 DIAGNOSIS — I059 Rheumatic mitral valve disease, unspecified: Secondary | ICD-10-CM

## 2011-04-10 MED ORDER — LISINOPRIL 20 MG PO TABS: 30.0000 mg | ORAL_TABLET | Freq: Every day | ORAL | Status: DC

## 2011-04-10 MED ORDER — LISINOPRIL 20 MG PO TABS
30.0000 mg | ORAL_TABLET | Freq: Every day | ORAL | Status: DC
  Administered 2011-04-10: 30 mg via ORAL
  Filled 2011-04-10: qty 1

## 2011-04-10 NOTE — Discharge Summary (Signed)
Internal Medicine Teaching [facility] Discharge Note  Name: [name] MRN: [id number] DOB: Oct 22, 1945 65 y.o.  Date of Admission: 04/08/2011  8:44 AM Date of Discharge: 04/10/2011 Attending Physician: Johny Sax, MD  Discharge Diagnosis: #CP of unclear etiology, resolved, possibly multifactorial (CHF vs anxiety attack) #ESRD on HD (MWF) #Hypertension #Diabetes Mellitus (diet controlled, A1c 5.9% 10/15/10) #Anxiety #Hypothyroidism #CAD with h/o MI x 2, stent x 4  Discharge Medications: Current Discharge Medication List  CONTINUE these medications which have CHANGED   Details  lisinopril (PRINIVIL,ZESTRIL) 20 MG tablet Take 1.5 tablets (30 mg total) by mouth daily. Qty: 60 tablet, Refills: 0      CONTINUE these medications which have NOT CHANGED   Details  ALPRAZolam (XANAX) 0.25 MG tablet Take 0.5 mg by mouth at bedtime as needed.     aspirin 81 MG tablet Take 81 mg by mouth daily.      docusate sodium (COLACE) 100 MG capsule Take 100 mg by mouth daily.      FLUoxetine (PROZAC) 20 MG capsule Take 20 mg by mouth daily.      folic acid-vitamin b complex-vitamin c-selenium-zinc (DIALYVITE) 3 MG TABS Take 1 tablet by mouth daily.      hydrocodone-acetaminophen (LORCET-HD) 5-500 MG per capsule Take 1 capsule by mouth 3 (three) times daily as needed.     levothyroxine (SYNTHROID, LEVOTHROID) 112 MCG tablet Take 112 mcg by mouth daily.      lovastatin (MEVACOR) 10 MG tablet Take 10 mg by mouth at bedtime.      metoprolol succinate (TOPROL-XL) 25 MG 24 hr tablet Take 25 mg by mouth daily.      oxyCODONE-acetaminophen (PERCOCET) 5-325 MG per tablet Take 1 tablet by mouth every 6 (six) hours as needed. Take one or two tablet every 6 hours     promethazine (PHENERGAN) 25 MG tablet Take 25 mg by mouth every 8 (eight) hours as needed.        STOP taking these medications     hydrOXYzine (ATARAX/VISTARIL) 25 MG tablet   Disposition and follow-up:   [name] was discharged from [facility] in Stable condition.  She will follow up in 1-2 weeks with the [facility] to re-evaluate symptoms.  We spoke with Dr. Dorothe Pea [phone]), who noted that she can stop by his office at discharge if she would like.  Follow-up Appointments:  Discharge Orders    Future Appointments: Provider: Department: Dept Phone: Center:   09/25/2011 1:00 PM [facility] [phone] [facility]   09/25/2011 1:30 PM [facility] [phone] [facility]     Future Orders Please Complete By Expires   Diet - low sodium heart healthy      Discharge instructions      Comments:   Please be sure to follow up in 1-2 weeks with the [facility] program.   Activity as tolerated - No restrictions      No wound care      (HEART FAILURE PATIENTS) Call MD:  Anytime you have any of the following symptoms: 1) 3 pound weight gain in 24 hours or 5 pounds in 1 week 2) shortness of breath, with or without a dry hacking cough 3) swelling in the hands, feet or stomach 4) if you have to sleep on extra pillows at night in order to breathe.      Call MD for:  persistant dizziness or light-headedness      Call MD for:  difficulty breathing, headache  or visual disturbances      Call MD for:  persistant nausea and vomiting      Call MD for:  temperature >100.4        Consultations: Treatment Team:  Cloretta Ned, MD  Procedures Performed:  Dg Chest 2 View 04/08/2011 Comparison: Portable chest x-ray of 04/09/2010   Findings: No active infiltrate or effusion is seen.  There may be mild pulmonary vascular congestion present.  Cardiomegaly is stable.  The left central venous line noted previously is no longer seen.  There are degenerative changes throughout the thoracic spine.   IMPRESSION: Cardiomegaly.  Question mild pulmonary vascular congestion.    2D Echo: done on 04/10/11, pending  Admission HPI:  Patient is a very pleasant 65 yo  woman that presents with acute onset 10/10 sharp, mid-chest pain at 6:30am on the morning of admission that woke her from sleep. She notes that pain radiated to b/l arms, and decreased to 6/10 after receiving NTG & morphine in the ED. She notes that the pain is similar to when she had an MI in the past. Pain does not have any relation to food, as she has not had an appetite for the past 4 days. She does note some difficulty with swallowing water. She denies pain or dyspnea on exertion. She reports that 3 days PTA she began to feel weak with decreased appetite & some mild nausea, as if a cold was coming on. 2 days PTA she notes a heavy pressure on her chest that she attributed to fibromyalgia. One day PTA, about 1 hour into HD, she felt SOB and "panicy" that resolved with O2. She also had 1 episode of yellow, nonbloody, nonblack vomitus. She reports nonspecific, epigastric abdominal pain after vomiting. She denies recent cough, congestion or sick contacts.   Admission Physical Exam:  Blood pressure 201/93, pulse 69, temperature 97.9 F (36.6 C), temperature source Oral, resp. rate 18, SpO2 98.00%.  General: resting in bed, pleasant, conversant  HEENT: PERRL, EOMI, no scleral icterus, no conjunctival pallor  Cardiac: RRR, no rubs, murmurs or gallops, mid chest tender to palpation (but feels different than sharp pain on the morning of admission)  Pulm: clear to auscultation bilaterally, but poor air mvmt, normal work of breathing  Abd: soft, mild epigastric tenderness to palpation, nondistended, BS normoactive  Ext: warm and well perfused, no pedal edema, Left BKA, right foot onychomyosis, small scattered healing ulcers of RLE (<56mm), bruising of RUE fistula access, Scars on left LE at amputation and on right LE above and below popliteal region, healed and nonerythematous.  Neuro: alert and oriented X3, cranial nerves II-XII grossly intact, strength equal in bilateral upper and lower extremities, Right LE  sensation impaired to light touch at foot.  Admission Labs: Most recent outpatient labs from [facility]: A1c 5.9% Oct 15, 2010,  Total Cholesterol was 154, triglycerides 125, HDL 52, LDL 77 all done on 10/10/2010.  CMP was normal that date except for a creatinine of 4.62 and a glucose of 125.  CK was normal at 51.  She had a normal fistulogram in April 2012.  Her TSH was normal in March of 2012. PTH was 119 in March of 2012.  Most recently he saw Dr. Darrick Penna, vascular surgeon, in April 2012.   Basic Metabolic Panel:  Basename  04/08/11 0902   NA  136   K  3.6   CL  97   CO2  24   GLUCOSE  110*  BUN  23   CREATININE  3.93*   CALCIUM  8.9   MG  --   PHOS  --    CBC:  Basename  04/08/11 0902   WBC  7.3   NEUTROABS  --   HGB  12.1   HCT  39.3   MCV  95.2   PLT  140*    Cardiac Enzymes:   Basename  04/08/11 0909   CKTOTAL  --   CKMB  --   CKMBINDEX  --   TROPONINI  <0.30    EKG: NSR, VR-68bpm, PR int-179ms, QRS-118, QT/QTc-468/498, probably LVH  Hospital Course by problem list: # Chest pain- Patient presented with substernal chest pain, which resolved by the morning following admission. All cardiac enzymes and ekg were negative, so unlikely acute MI. With history of CHF and ESRD, fluid overload is a more likely cause, especially in setting of pulmonary vascular congestion seen on CXR.  2D echocardiogram reveals EF of 35-40%.  Patient may benefit from addition of a venodilator. She is already a HD patient, so fluid status is managed by that, and likely the reason for resolution of symptoms.  If symptoms reappear, may also consider unstable angina.  Will be followed by [facility].   # HTN- Upon admission blood pressure was 201/93. Her blood pressure was controlled with her home doses of lisinopril and metoprolol, but systolics remained in the 160s. Upon discharge, her lisinopril dose was increased to 30mg . Her blood pressures will be followed up outpatient through the [facility].  # ESRD- Patient undergoes hemodialysis three times a week, on MWF. She was still hospitalized on Wednesday November 14, so she was dialyzed inpatient with 3000 cc output. She will continue dialysis three times a week and continue follow up at [facility].  # CAD- Patient's outpatient medications include aspirin, ACE inhibitor, beta-blocker, and statin. We continued these while inpatient and she will continue these medications, with the new dose of lisinopril, upon discharge. She will continue to follow up at [facility].  # CHF- Patient's last 2D echocardiogram was in 2010 with EF 60%. We repeated the echocardiogram during admission, and the results reveal EF 35-40%. She will follow up outpatient at [facility].  # Anxiety- Patient takes Xanax 0.5 mg po daily prn, and this was continued inpatient. She had some anxiety overnight while she was hospitalized, which was relieved by Xanax. She will continue to be followed at [facility]. She notes having "panic attacks" fairly regularly at home, and may benefit from outpatient counseling in addition to her medication.  # Depression- Patient's outpatient medications include Prozac 20mg  po daily, and this was continued inpatient. Patient did not note depressive symptoms while hospitalized and can continue taking Prozac and following up at [facility].  # Fibromyalgia- Stable while inpatient. She will continue outpatient pain management and follow up at [facility].  # S/p left BKA- Stable while inpatient. She will continue PT through the [facility], so inpatient PT was not consulted.  # Hypothyroidism- Stable while inpatient. [facility] will follow and check TSH in March 2013. She will continue her home dose of levothyroxine.  Discharge Vitals:  BP 167/74  Pulse 66  Temp(Src) 97.8 F (36.6 C) (Oral)  Resp 22  Ht 5\' 8"  (1.727 m)  Wt 177 lb 14.6 oz (80.7 kg)  BMI 27.05 kg/m2  SpO2 96%  Discharge Physical: General: Awake, lying in bed, in no acute distress  CV: RRR, no murmurs   Resp: CTAB, normal work of breathing, poor  air movement  Abd: Normoactive BS. Soft, nontender, nondistended.  Ext: Warm and well-perfused. Left BKA.  Neuro: Oriented x3. CNs grossly intact. Right LE sensation impaired to light touch at foot, intact above ankle. Right LE sensation intact to deep pressure throughout.  Discharge Labs:  Results for orders placed during the hospital encounter of 04/08/11 (from the past 24 hour(s))  CARDIAC PANEL(CRET KIN+CKTOT+MB+TROPI)     Status: Normal   Collection Time   04/09/11 10:31 AM      Component Value Range   Total CK 33  7 - 177 (U/L)   CK, MB 3.1  0.3 - 4.0 (ng/mL)   Troponin I <0.30  <0.30 (ng/mL)   Relative Index RELATIVE INDEX IS INVALID  0.0 - 2.5   GLUCOSE, CAPILLARY     Status: Normal   Collection Time   04/09/11 11:19 AM      Component Value Range   Glucose-Capillary 86  70 - 99 (mg/dL)   Comment 1 Notify RN    CBC     Status: Abnormal   Collection Time   04/09/11  2:19 PM      Component Value Range   WBC 6.5  4.0 - 10.5 (K/uL)   RBC 3.99  3.87 - 5.11 (MIL/uL)   Hemoglobin 11.8 (*) 12.0 - 15.0 (g/dL)   HCT 29.5  62.1 - 30.8 (%)   MCV 95.2  78.0 - 100.0 (fL)   MCH 29.6  26.0 - 34.0 (pg)   MCHC 31.1  30.0 - 36.0 (g/dL)   RDW 65.7 (*) 84.6 - 15.5 (%)   Platelets 156  150 - 400 (K/uL)  RENAL FUNCTION PANEL     Status: Abnormal   Collection Time   04/09/11  2:19 PM      Component Value Range   Sodium 132 (*) 135 - 145 (mEq/L)   Potassium 3.5  3.5 - 5.1 (mEq/L)   Chloride 93 (*) 96 - 112 (mEq/L)   CO2 27  19 - 32 (mEq/L)   Glucose, Bld 146 (*) 70 - 99 (mg/dL)   BUN 33 (*) 6 - 23 (mg/dL)   Creatinine, Ser 9.62 (*) 0.50 - 1.10 (mg/dL)   Calcium 8.8  8.4 - 95.2 (mg/dL)   Phosphorus 2.6  2.3 - 4.6 (mg/dL)   Albumin 3.2 (*) 3.5 - 5.2 (g/dL)   GFR calc non Af Amer 8 (*) >90 (mL/min)   GFR calc Af Amer 9 (*) >90 (mL/min)  GLUCOSE, CAPILLARY     Status: Abnormal   Collection Time   04/09/11  9:48 PM      Component Value Range    Glucose-Capillary 148 (*) 70 - 99 (mg/dL)   Comment 1 Notify RN     Signed: KAPADIA, Anaya Bovee 04/10/2011, 10:07 AM

## 2011-04-10 NOTE — Progress Notes (Signed)
  Internal Medicine Teaching Service Attending Note Date: 04/10/2011  Patient name: [name]  Medical record number: [id number]  Date of birth: 02/27/1946    This patient has been seen and discussed with the house staff. Please see their note for complete details. I concur with their findings with the following additions/corrections: She is CP free, her TTE has been completed this AM and [name] f/u as outpt. Plan to d/c home today with f/u in [facility] program.  Johny Sax 04/10/2011, 9:41 AM

## 2011-04-10 NOTE — Progress Notes (Signed)
  Echocardiogram 2D Echocardiogram has been performed.  [name] 04/10/2011, 9:29 AM

## 2011-04-10 NOTE — Progress Notes (Signed)
04/10/11 Nursing 1336 DC IV, DC Tele, DC home. Discharge instructions and home medications discussed with patient and patient's family members. Patient denies any questions or concerns at this time. Patient leaving unit via wheelchair and appears in no acute distress. Ernesta Amble, RN

## 2011-04-10 NOTE — Progress Notes (Signed)
Subjective:  No chest pain, sob; no problems on HD yesterday. Objective Vital signs in last 24 hours: Filed Vitals:   04/09/11 1730 04/09/11 1754 04/09/11 2140 04/10/11 0508  BP: 166/78 168/90 153/80 167/74  Pulse: 58 60 63 66  Temp:  96.3 F (35.7 C) 97.6 F (36.4 C) 97.8 F (36.6 C)  TempSrc:  Oral    Resp: 17 16 20 22   Height:      Weight:  81.8 kg (180 lb 5.4 oz)  80.7 kg (177 lb 14.6 oz)  SpO2:  92% 96% 96%   Weight change: 0.7 kg (1 lb 8.7 oz)  Intake/Output Summary (Last 24 hours) at 04/10/11 1041 Last data filed at 04/10/11 0900  Gross per 24 hour  Intake    880 ml  Output   3000 ml  Net  -2120 ml   Labs: Basic Metabolic Panel:  Lab 04/09/11 0454 04/08/11 0902  NA 132* 136  K 3.5 3.6  CL 93* 97  CO2 27 24  GLUCOSE 146* 110*  BUN 33* 23  CREATININE 5.26* 3.93*  CALCIUM 8.8 8.9  ALB -- --  PHOS 2.6 --   Liver Function Tests:  Lab 04/09/11 1419  AST --  ALT --  ALKPHOS --  BILITOT --  PROT --  ALBUMIN 3.2*   CBC:  Lab 04/09/11 1419 04/08/11 0902  WBC 6.5 7.3  NEUTROABS -- --  HGB 11.8* 12.1  HCT 38.0 39.3  MCV 95.2 95.2  PLT 156 140*    Lab 04/09/11 1031 04/09/11 0150 04/08/11 1820 04/08/11 0909  CKTOTAL 33 37 33 --  CKMB 3.1 3.0 2.8 --  CKMBINDEX -- -- -- --  TROPONINI <0.30 <0.30 <0.30 <0.30  CBG:  Lab 04/09/11 2148 04/09/11 1119  GLUCAP 148* 86  Medications:    . aspirin EC  81 mg Oral Daily  . calcium carbonate  3 tablet Oral TID WC  . diphenhydrAMINE  25 mg Oral Once  . FLUoxetine  20 mg Oral Daily  . folic acid  1 mg Oral Daily  . heparin  5,000 Units Subcutaneous Q8H  . levothyroxine  112 mcg Oral Daily  . lisinopril  30 mg Oral Daily  . metoprolol succinate  25 mg Oral Daily  . multivitamin  1 tablet Oral QHS  . paricalcitol  4 mcg Intravenous 3 times weekly  . simvastatin  5 mg Oral q1800  . sodium chloride  3 mL Intravenous Q12H  . DISCONTD: lisinopril  20 mg Oral Daily    I  have reviewed scheduled and prn  medications.  Physical Exam: General: NAD Heart: RRR Lungs: CTA Abdomen: obese soft Extremities:no edema  Dialysis Access:AVF right upper - + bruit   Problem/Plan: 1. Chest pain - resolved; TTE pending;  2. ESRD - HD yesterday without problems.  New EDW for d/c 81.. 3. Anemia - no ESA for d/c 4. Secondary hyperparathyroidism - no change 5. HTN/volume - decreased volume..may need further lowering of EDW at dialysis; ask dialysis to challenge    Sheffield Slider, PA-C [facility] [phone]  04/10/2011,10:41 AM  LOS: 2 days    I have seen and examined this patient and agree with the plan of care for d/c today.  Tani Virgo A 04/10/2011, 10:51 AM

## 2011-05-05 ENCOUNTER — Ambulatory Visit
Admission: RE | Admit: 2011-05-05 | Discharge: 2011-05-05 | Disposition: A | Discharge status: Home / Self Care | Payer: No Typology Code available for payment source | Source: Ambulatory Visit | Attending: Family Medicine | Admitting: Family Medicine

## 2011-05-05 ENCOUNTER — Other Ambulatory Visit: Payer: Self-pay | Admitting: Family Medicine

## 2011-05-15 ENCOUNTER — Emergency Department (HOSPITAL_COMMUNITY)
Admission: EM | Admit: 2011-05-15 | Discharge: 2011-05-15 | Disposition: A | Discharge status: Home / Self Care | Attending: Emergency Medicine | Admitting: Emergency Medicine

## 2011-05-15 ENCOUNTER — Other Ambulatory Visit: Payer: Self-pay

## 2011-05-15 ENCOUNTER — Emergency Department (HOSPITAL_COMMUNITY)

## 2011-05-15 DIAGNOSIS — I120 Hypertensive chronic kidney disease with stage 5 chronic kidney disease or end stage renal disease: Secondary | ICD-10-CM | POA: Insufficient documentation

## 2011-05-15 DIAGNOSIS — R0602 Shortness of breath: Secondary | ICD-10-CM | POA: Insufficient documentation

## 2011-05-15 DIAGNOSIS — Z992 Dependence on renal dialysis: Secondary | ICD-10-CM | POA: Insufficient documentation

## 2011-05-15 DIAGNOSIS — N186 End stage renal disease: Secondary | ICD-10-CM | POA: Insufficient documentation

## 2011-05-15 DIAGNOSIS — S88119A Complete traumatic amputation at level between knee and ankle, unspecified lower leg, initial encounter: Secondary | ICD-10-CM | POA: Insufficient documentation

## 2011-05-15 DIAGNOSIS — I252 Old myocardial infarction: Secondary | ICD-10-CM | POA: Insufficient documentation

## 2011-05-15 DIAGNOSIS — J45909 Unspecified asthma, uncomplicated: Secondary | ICD-10-CM | POA: Insufficient documentation

## 2011-05-15 DIAGNOSIS — I2510 Atherosclerotic heart disease of native coronary artery without angina pectoris: Secondary | ICD-10-CM | POA: Insufficient documentation

## 2011-05-15 DIAGNOSIS — E119 Type 2 diabetes mellitus without complications: Secondary | ICD-10-CM | POA: Insufficient documentation

## 2011-05-15 DIAGNOSIS — E039 Hypothyroidism, unspecified: Secondary | ICD-10-CM | POA: Insufficient documentation

## 2011-05-15 DIAGNOSIS — M810 Age-related osteoporosis without current pathological fracture: Secondary | ICD-10-CM | POA: Insufficient documentation

## 2011-05-15 DIAGNOSIS — I509 Heart failure, unspecified: Secondary | ICD-10-CM | POA: Insufficient documentation

## 2011-05-15 DIAGNOSIS — IMO0001 Reserved for inherently not codable concepts without codable children: Secondary | ICD-10-CM | POA: Insufficient documentation

## 2011-05-15 DIAGNOSIS — E785 Hyperlipidemia, unspecified: Secondary | ICD-10-CM | POA: Insufficient documentation

## 2011-05-15 LAB — PRO B NATRIURETIC PEPTIDE: Pro B Natriuretic peptide (BNP): >70000 pg/mL — ABNORMAL HIGH (ref 0–125)

## 2011-05-15 LAB — DIFFERENTIAL
Lymphocytes Relative: 12 % (ref 12–46)
Monocytes Absolute: 0.7 10*3/uL (ref 0.1–1.0)
Monocytes Relative: 10 % (ref 3–12)
Neutro Abs: 5.8 10*3/uL (ref 1.7–7.7)

## 2011-05-15 LAB — CBC
HCT: 38.1 % (ref 36.0–46.0)
Hemoglobin: 11.9 g/dL — ABNORMAL LOW (ref 12.0–15.0)
WBC: 7.6 10*3/uL (ref 4.0–10.5)

## 2011-05-15 LAB — COMPREHENSIVE METABOLIC PANEL
BUN: 19 mg/dL (ref 6–23)
CO2: 28 mEq/L (ref 19–32)
Chloride: 93 mEq/L — ABNORMAL LOW (ref 96–112)
Creatinine, Ser: 3.35 mg/dL — ABNORMAL HIGH (ref 0.50–1.10)
GFR calc non Af Amer: 14 mL/min — ABNORMAL LOW (ref >90)
Glucose, Bld: 189 mg/dL — ABNORMAL HIGH (ref 70–99)
Total Bilirubin: 1.3 mg/dL — ABNORMAL HIGH (ref 0.3–1.2)

## 2011-05-15 LAB — CARDIAC PANEL(CRET KIN+CKTOT+MB+TROPI): Total CK: 38 U/L (ref 7–177)

## 2011-05-15 MED ORDER — DIPHENHYDRAMINE HCL 50 MG/ML IJ SOLN
12.5000 mg | Freq: Once | INTRAMUSCULAR | Status: AC
  Administered 2011-05-15: 12.5 mg via INTRAVENOUS

## 2011-05-15 MED ORDER — LORAZEPAM 2 MG/ML IJ SOLN
1.0000 mg | Freq: Once | INTRAMUSCULAR | Status: AC
  Administered 2011-05-15: 1 mg via INTRAVENOUS
  Filled 2011-05-15: qty 1

## 2011-05-15 NOTE — ED Notes (Signed)
Pt to be discharged, to be transport by [facility]

## 2011-05-15 NOTE — ED Notes (Signed)
Pt requesting something for her itching skin, MD made aware and orders received

## 2011-05-15 NOTE — ED Provider Notes (Signed)
History     CSN: [id number]  Arrival date & time 05/15/11  1191   First MD Initiated Contact with Patient 05/15/11 [phone]      Chief Complaint  Patient presents with  . Shortness of Breath    (Consider location/radiation/quality/duration/timing/severity/associated sxs/prior treatment) HPI... complains of shortness of breath. Minimal chest pain. No diaphoresis or nausea.  Patient has a host of health care problems including end-stage renal disease, coronary artery disease, hypertension and diabetes.  She is not obviously dyspneic or tachypnea.  Nothing makes her symptoms better or worse.  No fever, chills, URI symptoms  Past Medical History  Diagnosis Date  . ESRD (end stage renal disease) on dialysis   . Hypertension   . Diabetes mellitus   . Anxiety   . Hypothyroid   . Fibromyalgia   . CHF (congestive heart failure)   . Diabetic retinopathy   . PAD (peripheral artery disease)     s/p L BKA  . Diabetic retinopathy   . Major depression   . Morphea   . Osteoporosis   . Peripheral neuropathy   . Carpal tunnel syndrome, bilateral   . Secondary hyperparathyroidism   . Vitamin D deficiency   . Refusal of blood transfusions as patient is Jehovah's Witness   . CAD (coronary artery disease)     s/p MI x 2, s/p stent x 4  . PONV (postoperative nausea and vomiting)   . Hyperlipidemia   . Asthma     "haven't had it since 1970's"  . Migraines   . Constipation   . Hemorrhoids   . Dialysis patient     [facility]; Mon, Wed, Friday    Past Surgical History  Procedure Date  . 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 Cardiac catheterization 2006, 2007    pt had 4 stents placed  . Cholecystectomy 1987  . Left hip hemiarthroplasty 10/2009  . Vaginal hysterectomy 1994    with BSO  . Cataract extraction, bilateral 2000  . Peripheral arterial stent graft     bilaterally "(left ~ 2010; right ~ 2011"  . Av fistula placement ~ 2011    right upper arm    Family History    Problem Relation Age of Onset  . Alcohol abuse Father   . Multiple myeloma Mother   . Diabetes type II Mother   . Hypertension Mother   . Appendicitis Brother     History  Substance Use Topics  . Smoking status: Former Smoker -- 2.0 packs/day for 30 years    Types: Cigarettes    Quit date: 04/07/1989  . Smokeless tobacco: Never Used  . Alcohol Use: 0.0 oz/week     "socially; I'm not an alcoholic"    OB History    Grav Para Term Preterm Abortions TAB SAB Ect Mult Living                  Review of Systems  All other systems reviewed and are negative.    Allergies  Other; Pumpkin seed oil-saw palmetto-zinc; Shrimp; and Penicillins  Home Medications   Current Outpatient Rx  Name Route Sig Dispense Refill  . ALPRAZOLAM 0.25 MG PO TABS Oral Take 0.25 mg by mouth 3 (three) times daily as needed. As needed for anxiety.    . ASPIRIN 81 MG PO TABS Oral Take 81 mg by mouth daily.      Marland Kitchen CETIRIZINE HCL 10 MG PO TABS Oral Take 10 mg by mouth daily.      Marland Kitchen  CLONAZEPAM 1 MG PO TABS Oral Take 1 mg by mouth 2 (two) times daily.      Marland Kitchen DIPHENHYDRAMINE HCL 25 MG PO CAPS Oral Take 25 mg by mouth every 6 (six) hours as needed. As needed for allergies.     . DOCUSATE SODIUM 100 MG PO CAPS Oral Take 100 mg by mouth daily.      Marland Kitchen FLUOXETINE HCL 40 MG PO CAPS Oral Take 40 mg by mouth daily.      Marland Kitchen DIALYVITE 3000 3 MG PO TABS Oral Take 1 tablet by mouth daily.      Marland Kitchen HYDROCODONE-ACETAMINOPHEN 5-500 MG PO CAPS Oral Take 1 capsule by mouth 3 (three) times daily as needed.     Marland Kitchen HYDROXYZINE HCL 25 MG PO TABS Oral Take 25 mg by mouth every 6 (six) hours as needed. As needed for itching.     Marland Kitchen LEVOTHYROXINE SODIUM 112 MCG PO TABS Oral Take 112 mcg by mouth daily.      Marland Kitchen LOPERAMIDE HCL 2 MG PO CAPS Oral Take 2 mg by mouth 4 (four) times daily as needed. As needed for loose stools.     Marland Kitchen LOVASTATIN 10 MG PO TABS Oral Take 10 mg by mouth at bedtime.      Marland Kitchen METOPROLOL SUCCINATE ER 25 MG PO TB24 Oral  Take 25 mg by mouth daily.     . OXYCODONE-ACETAMINOPHEN 5-325 MG PO TABS Oral Take 1 tablet by mouth every 6 (six) hours as needed. As needed for pain.    Marland Kitchen RANITIDINE HCL 150 MG PO TABS Oral Take 150 mg by mouth 2 (two) times daily.      . TRIAMCINOLONE ACETONIDE 0.1 % EX CREA Topical Apply 1 application topically 2 (two) times a week. Apply to rash.     Marland Kitchen PROMETHAZINE HCL 25 MG PO TABS Oral Take 25 mg by mouth every 8 (eight) hours as needed.        BP 148/98  Pulse 77  Temp(Src) 97.4 F (36.3 C) (Oral)  SpO2 99%  Physical Exam  Nursing note and vitals reviewed. Constitutional: She is oriented to person, place, and time. She appears well-developed and well-nourished.  HENT:  Head: Normocephalic and atraumatic.  Eyes: Conjunctivae and EOM are normal. Pupils are equal, round, and reactive to light.  Neck: Normal range of motion. Neck supple.  Cardiovascular: Normal rate and regular rhythm.   Pulmonary/Chest: Effort normal and breath sounds normal.  Abdominal: Soft. Bowel sounds are normal.  Musculoskeletal: Normal range of motion.       2+ lower extremity edema  Neurological: She is alert and oriented to person, place, and time.  Skin: Skin is warm and dry.  Psychiatric: She has a normal mood and affect.    ED Course  Procedures (including critical care time)  Labs Reviewed  CBC - Abnormal; Notable for the following:    Hemoglobin 11.9 (*)    RDW 17.9 (*)    All other components within normal limits  COMPREHENSIVE METABOLIC PANEL - Abnormal; Notable for the following:    Chloride 93 (*)    Glucose, Bld 189 (*)    Creatinine, Ser 3.35 (*)    Albumin 3.1 (*)    Total Bilirubin 1.3 (*)    GFR calc non Af Amer 14 (*)    GFR calc Af Amer 16 (*)    All other components within normal limits  DIFFERENTIAL  CARDIAC PANEL(CRET KIN+CKTOT+MB+TROPI)  PRO B NATRIURETIC PEPTIDE   Dg Chest  Port 1 View  05/15/2011  *RADIOLOGY REPORT*  Clinical Data: Epigastric pain which shoot  through to back  PORTABLE CHEST - 1 VIEW  Comparison: 05/05/2011  Findings:  Heart size is mildly enlarged.  There is no pleural effusion or interstitial edema.  Pulmonary venous congestion is present.  No airspace consolidation.  IMPRESSION:  1.  Mild cardiac enlargement. 2.  Pulmonary venous congestion.  Original Report Authenticated By: Rosealee Albee, M.D.     No diagnosis found.  Date: 05/15/2011  Rate: 73  Rhythm: normal sinus rhythm  QRS Axis: normal  Intervals: normal  ST/T Wave abnormalities: normal  Conduction Disutrbances:none  Narrative Interpretation:   Old EKG Reviewed: unchanged   MDM  Elevated BNP noted.  Patient is lying supine on the stretcher without dyspnea or tachypnea. Her cardiac enzymes negative.  EKG shows no acute findings. Dialysis tomorrow. We'll discharge home         Donnetta Hutching, MD 05/15/11 1239

## 2011-05-15 NOTE — ED Notes (Signed)
Pt very anxious at this time, MD made aware and orders received

## 2011-05-15 NOTE — ED Notes (Signed)
Pt asleep at this time

## 2011-05-15 NOTE — ED Notes (Signed)
Portible x-ray at bedside

## 2011-05-15 NOTE — ED Notes (Signed)
Pt arrived via EMS, c/o SOB/chest pain

## 2011-05-15 NOTE — ED Notes (Signed)
Pt placed in gown, on monitor with continuous blood pressure cuff and pulse oximetry.  Restricted extremity place on right wrist

## 2011-10-15 ENCOUNTER — Encounter: Payer: Self-pay | Admitting: Neurosurgery

## 2011-10-16 ENCOUNTER — Encounter (INDEPENDENT_AMBULATORY_CARE_PROVIDER_SITE_OTHER): Payer: Medicare (Managed Care) | Admitting: *Deleted

## 2011-10-16 ENCOUNTER — Ambulatory Visit (INDEPENDENT_AMBULATORY_CARE_PROVIDER_SITE_OTHER): Payer: Medicare (Managed Care) | Admitting: Neurosurgery

## 2011-10-16 ENCOUNTER — Ambulatory Visit (INDEPENDENT_AMBULATORY_CARE_PROVIDER_SITE_OTHER): Payer: Medicare (Managed Care) | Admitting: *Deleted

## 2011-10-16 ENCOUNTER — Encounter: Payer: Self-pay | Admitting: Neurosurgery

## 2011-10-16 VITALS — BP 185/85 | HR 68 | Resp 12 | Ht 68.0 in | Wt 156.0 lb

## 2011-10-16 DIAGNOSIS — Z48812 Encounter for surgical aftercare following surgery on the circulatory system: Secondary | ICD-10-CM

## 2011-10-16 DIAGNOSIS — I7092 Chronic total occlusion of artery of the extremities: Secondary | ICD-10-CM | POA: Insufficient documentation

## 2011-10-16 DIAGNOSIS — I739 Peripheral vascular disease, unspecified: Secondary | ICD-10-CM

## 2011-10-16 NOTE — Progress Notes (Signed)
[facility] [place] HISTORY AND PHYSICAL   CC: Annual right lower extremity graft duplex and ABI patient has history of left BKA Referring Physician: Fields  History of Present Illness: 66 year old female patient of Dr. Darrick Penna seen for followup of right lower extremity PAD. The patient has a history of left BKA as well as several procedures for hemodialysis access as well as a right femoropopliteal graft in 2011.Marland Kitchen Patient denies claudication in her right lower extremity. She has no open wounds and no complaints of rest pain.  Past Medical History  Diagnosis Date  . ESRD (end stage renal disease) on dialysis   . Hypertension   . Diabetes mellitus   . Anxiety   . Hypothyroid   . Fibromyalgia   . CHF (congestive heart failure)   . Diabetic retinopathy   . PAD (peripheral artery disease)     s/p L BKA  . Diabetic retinopathy   . Major depression   . Morphea   . Osteoporosis   . Peripheral neuropathy   . Carpal tunnel syndrome, bilateral   . Secondary hyperparathyroidism   . Vitamin d deficiency   . Refusal of blood transfusions as patient is Jehovah's Witness   . CAD (coronary artery disease)     s/p MI x 2, s/p stent x 4  . PONV (postoperative nausea and vomiting)   . Hyperlipidemia   . Asthma     "haven't had it since 1970's"  . Migraines   . Constipation   . Hemorrhoids   . Dialysis patient     [facility]; Mon, Wed, Friday    ROS: [x]  Positive   [ ]  Denies    General: [ ]  Weight loss, [ ]  Fever, [ ]  chills Neurologic: [ ]  Dizziness, [ ]  Blackouts, [ ]  Seizure [ ]  Stroke, [ ]  "Mini stroke", [ ]  Slurred speech, [ ]  Temporary blindness; [ ]  weakness in arms or legs, [ ]  Hoarseness Cardiac: [ ]  Chest pain/pressure, [ ]  Shortness of breath at rest [ ]  Shortness of breath with exertion, [ ]  Atrial fibrillation or irregular heartbeat Vascular: [ ]  Pain in legs with walking, [ ]  Pain in legs at rest, [ ]  Pain in legs at night,  [ ]  Non-healing ulcer, [ ]   Blood clot in vein/DVT,  Numbness in lower ext's Pulmonary: [ ]  Home oxygen, [ ]  Productive cough, [ ]  Coughing up blood, [ ]  Asthma,  [ ]  Wheezing Musculoskeletal:  [ ]  Arthritis, [ ]  Low back pain, [ ]  Joint pain Hematologic: [ ]  Easy Bruising, [ ]  Anemia; [ ]  Hepatitis Gastrointestinal: [ ]  Blood in stool, [ ]  Gastroesophageal Reflux/heartburn, [ ]  Trouble swallowing Urinary: [ ]  chronic Kidney disease, [ ]  on HD - [ ]  MWF or [ ]  TTHS, [ ]  Burning with urination, [ ]  Difficulty urinating Skin: [ ]  Rashes, [ ]  Wounds Psychological: [ ]  Anxiety, [ ]  Depression   Social History History  Substance Use Topics  . Smoking status: Former Smoker -- 2.0 packs/day for 30 years    Types: Cigarettes    Quit date: 04/07/1989  . Smokeless tobacco: Never Used  . Alcohol Use: 0.0 oz/week     "socially; I'm not an alcoholic"    Family History Family History  Problem Relation Age of Onset  . Alcohol abuse Father   . Multiple myeloma Mother   . Diabetes type II Mother   . Hypertension Mother   . Cancer Mother   . Diabetes Mother   .  Appendicitis Brother     Allergies  Allergen Reactions  . Other Anaphylaxis    peaches  . Pumpkin Seed Oil-Saw Palmetto-Zinc (Propalmex) Anaphylaxis  . Shrimp (Shellfish Allergy) Anaphylaxis  . Penicillins     Tongue swelling.     Current Outpatient Prescriptions  Medication Sig Dispense Refill  . ALPRAZolam (XANAX) 0.25 MG tablet Take 0.25 mg by mouth 3 (three) times daily as needed. As needed for anxiety.      Marland Kitchen aspirin 81 MG tablet Take 81 mg by mouth daily.        . diphenhydrAMINE (BENADRYL) 25 mg capsule Take 25 mg by mouth every 6 (six) hours as needed. As needed for allergies.       Marland Kitchen docusate sodium (COLACE) 100 MG capsule Take 100 mg by mouth daily.        . folic acid-vitamin b complex-vitamin c-selenium-zinc (DIALYVITE) 3 MG TABS Take 1 tablet by mouth daily.        . hydrocodone-acetaminophen (LORCET-HD) 5-500 MG per capsule Take 1 capsule  by mouth 3 (three) times daily as needed.       . hydrOXYzine (ATARAX/VISTARIL) 25 MG tablet Take 25 mg by mouth every 6 (six) hours as needed. As needed for itching.       . levothyroxine (SYNTHROID, LEVOTHROID) 112 MCG tablet Take 112 mcg by mouth daily.        Marland Kitchen loperamide (IMODIUM) 2 MG capsule Take 2 mg by mouth 4 (four) times daily as needed. As needed for loose stools.       . lovastatin (MEVACOR) 10 MG tablet Take 10 mg by mouth at bedtime.        . metoprolol succinate (TOPROL-XL) 25 MG 24 hr tablet Take 25 mg by mouth daily.       Marland Kitchen oxyCODONE-acetaminophen (PERCOCET) 5-325 MG per tablet Take 1 tablet by mouth every 6 (six) hours as needed. As needed for pain.      . promethazine (PHENERGAN) 25 MG tablet Take 25 mg by mouth every 8 (eight) hours as needed.        . triamcinolone cream (KENALOG) 0.1 % Apply 1 application topically 2 (two) times a week. Apply to rash.       . cetirizine (ZYRTEC) 10 MG tablet Take 10 mg by mouth daily.        . clonazePAM (KLONOPIN) 1 MG tablet Take 1 mg by mouth 2 (two) times daily.        Marland Kitchen FLUoxetine (PROZAC) 40 MG capsule Take 40 mg by mouth daily.          Physical Examination  Filed Vitals:   10/16/11 1440  BP: 185/85  Pulse: 68  Resp: 12    Body mass index is 23.72 kg/(m^2).  General:  WDWN in NAD Gait: Normal HEENT: WNL Eyes: Pupils equal Pulmonary: normal non-labored breathing , without Rales, rhonchi,  wheezing Cardiac: RRR, without  Murmurs, rubs or gallops; No carotid bruits Abdomen: soft, NT, no masses Skin: no rashes, ulcers noted Vascular Exam/Pulses: Patient has palpable PT and DP pulses on the right lower extremity, she has 2+ radial pulses bilaterally, right femoral pulses 2+  Extremities without ischemic changes, no Gangrene , no cellulitis; no open wounds;  Musculoskeletal: no muscle wasting or atrophy  Neurologic: A&O X 3; Appropriate Affect ; SENSATION: normal; MOTOR FUNCTION:  moving all extremities equally. Speech  is fluent/normal  Non-Invasive Vascular Imaging: Lower extremity ABIs show noncompressible vessels with triphasic waveforms. Her TBI is stable compared  to previous study of 0.64, bypass graft shows widely patent femoropopliteal on the right.  ASSESSMENT/PLAN: Asymptomatic stable patient with a right femoral popliteal graft as well as right toe pressure, the patient will followup here in one year with repeat bypass graft in ABI on the right, she is in agreement with this and  her questions were encouraged and answered.  Lauree Chandler ANP  Clinic M.D.: [facility]

## 2011-10-24 NOTE — Procedures (Unsigned)
BYPASS GRAFT EVALUATION  INDICATION:  Right fem-pop graft with popliteal endarterectomy 09/04/2009, left AKA.  HISTORY: Diabetes:  Yes. Cardiac:  No. Hypertension:  Yes. Smoking:  Previous. Previous Surgery:  See above.  SINGLE LEVEL ARTERIAL EXAM                              RIGHT              LEFT Brachial: Anterior tibial: Posterior tibial: Peroneal: Ankle/brachial index:  PREVIOUS ABI:  Date: 09/19/2010  RIGHT:  0.69  LEFT:  Amputation  LOWER EXTREMITY BYPASS GRAFT DUPLEX EXAM:  DUPLEX: 1. Widely patent right fem-pop graft with no re-stenosis noted. 2. Evidence of tibial disease; elevated velocities are observed at the     native outflow just distal to the anastomosis and distal flow could     not be appreciated; however, waveforms at the ankle are triphasic.  IMPRESSION:  Widely patent right femoral-popliteal graft without evidence of re-stenosis observed.     ___________________________________________ Janetta Hora. Fields, MD  LT/MEDQ  D:  10/16/2011  T:  10/17/2011  Job:  [id number]

## 2012-02-03 ENCOUNTER — Other Ambulatory Visit: Payer: Self-pay | Admitting: Family Medicine

## 2012-02-03 ENCOUNTER — Ambulatory Visit
Admission: RE | Admit: 2012-02-03 | Discharge: 2012-02-03 | Disposition: A | Discharge status: Home / Self Care | Source: Ambulatory Visit | Attending: Family Medicine | Admitting: Family Medicine

## 2012-02-03 DIAGNOSIS — W19XXXA Unspecified fall, initial encounter: Secondary | ICD-10-CM

## 2012-02-03 DIAGNOSIS — Z8781 Personal history of (healed) traumatic fracture: Secondary | ICD-10-CM

## 2012-08-09 ENCOUNTER — Inpatient Hospital Stay (HOSPITAL_COMMUNITY)
Admission: EM | Admit: 2012-08-09 | Discharge: 2012-08-12 | DRG: 917 | Disposition: A | Discharge status: Home / Self Care | Payer: Medicare (Managed Care) | Attending: Pulmonary Disease | Admitting: Pulmonary Disease

## 2012-08-09 ENCOUNTER — Emergency Department (HOSPITAL_COMMUNITY): Payer: Medicare (Managed Care)

## 2012-08-09 ENCOUNTER — Encounter (HOSPITAL_COMMUNITY): Payer: Self-pay | Admitting: Emergency Medicine

## 2012-08-09 DIAGNOSIS — I2789 Other specified pulmonary heart diseases: Secondary | ICD-10-CM | POA: Diagnosis present

## 2012-08-09 DIAGNOSIS — G629 Polyneuropathy, unspecified: Secondary | ICD-10-CM

## 2012-08-09 DIAGNOSIS — M810 Age-related osteoporosis without current pathological fracture: Secondary | ICD-10-CM | POA: Diagnosis present

## 2012-08-09 DIAGNOSIS — Z992 Dependence on renal dialysis: Secondary | ICD-10-CM

## 2012-08-09 DIAGNOSIS — E1139 Type 2 diabetes mellitus with other diabetic ophthalmic complication: Secondary | ICD-10-CM | POA: Diagnosis present

## 2012-08-09 DIAGNOSIS — D631 Anemia in chronic kidney disease: Secondary | ICD-10-CM | POA: Diagnosis present

## 2012-08-09 DIAGNOSIS — F411 Generalized anxiety disorder: Secondary | ICD-10-CM | POA: Diagnosis present

## 2012-08-09 DIAGNOSIS — K5900 Constipation, unspecified: Secondary | ICD-10-CM | POA: Diagnosis present

## 2012-08-09 DIAGNOSIS — E875 Hyperkalemia: Secondary | ICD-10-CM | POA: Diagnosis present

## 2012-08-09 DIAGNOSIS — E785 Hyperlipidemia, unspecified: Secondary | ICD-10-CM | POA: Diagnosis present

## 2012-08-09 DIAGNOSIS — G609 Hereditary and idiopathic neuropathy, unspecified: Secondary | ICD-10-CM | POA: Diagnosis present

## 2012-08-09 DIAGNOSIS — I739 Peripheral vascular disease, unspecified: Secondary | ICD-10-CM

## 2012-08-09 DIAGNOSIS — T400X1A Poisoning by opium, accidental (unintentional), initial encounter: Secondary | ICD-10-CM | POA: Diagnosis present

## 2012-08-09 DIAGNOSIS — Z7982 Long term (current) use of aspirin: Secondary | ICD-10-CM

## 2012-08-09 DIAGNOSIS — I2510 Atherosclerotic heart disease of native coronary artery without angina pectoris: Secondary | ICD-10-CM | POA: Diagnosis present

## 2012-08-09 DIAGNOSIS — G4733 Obstructive sleep apnea (adult) (pediatric): Secondary | ICD-10-CM | POA: Diagnosis present

## 2012-08-09 DIAGNOSIS — I252 Old myocardial infarction: Secondary | ICD-10-CM

## 2012-08-09 DIAGNOSIS — Z87891 Personal history of nicotine dependence: Secondary | ICD-10-CM

## 2012-08-09 DIAGNOSIS — N2581 Secondary hyperparathyroidism of renal origin: Secondary | ICD-10-CM | POA: Diagnosis present

## 2012-08-09 DIAGNOSIS — Z9861 Coronary angioplasty status: Secondary | ICD-10-CM

## 2012-08-09 DIAGNOSIS — F329 Major depressive disorder, single episode, unspecified: Secondary | ICD-10-CM | POA: Diagnosis present

## 2012-08-09 DIAGNOSIS — R4182 Altered mental status, unspecified: Secondary | ICD-10-CM

## 2012-08-09 DIAGNOSIS — I120 Hypertensive chronic kidney disease with stage 5 chronic kidney disease or end stage renal disease: Secondary | ICD-10-CM | POA: Diagnosis present

## 2012-08-09 DIAGNOSIS — E559 Vitamin D deficiency, unspecified: Secondary | ICD-10-CM | POA: Diagnosis present

## 2012-08-09 DIAGNOSIS — E1169 Type 2 diabetes mellitus with other specified complication: Secondary | ICD-10-CM | POA: Diagnosis not present

## 2012-08-09 DIAGNOSIS — J45909 Unspecified asthma, uncomplicated: Secondary | ICD-10-CM | POA: Diagnosis present

## 2012-08-09 DIAGNOSIS — E11319 Type 2 diabetes mellitus with unspecified diabetic retinopathy without macular edema: Secondary | ICD-10-CM | POA: Diagnosis present

## 2012-08-09 DIAGNOSIS — T40601A Poisoning by unspecified narcotics, accidental (unintentional), initial encounter: Principal | ICD-10-CM | POA: Diagnosis present

## 2012-08-09 DIAGNOSIS — Z79899 Other long term (current) drug therapy: Secondary | ICD-10-CM

## 2012-08-09 DIAGNOSIS — J9600 Acute respiratory failure, unspecified whether with hypoxia or hypercapnia: Secondary | ICD-10-CM | POA: Diagnosis present

## 2012-08-09 DIAGNOSIS — E039 Hypothyroidism, unspecified: Secondary | ICD-10-CM | POA: Diagnosis present

## 2012-08-09 DIAGNOSIS — IMO0001 Reserved for inherently not codable concepts without codable children: Secondary | ICD-10-CM | POA: Diagnosis present

## 2012-08-09 DIAGNOSIS — K219 Gastro-esophageal reflux disease without esophagitis: Secondary | ICD-10-CM

## 2012-08-09 DIAGNOSIS — D649 Anemia, unspecified: Secondary | ICD-10-CM | POA: Diagnosis not present

## 2012-08-09 DIAGNOSIS — I5022 Chronic systolic (congestive) heart failure: Secondary | ICD-10-CM | POA: Diagnosis present

## 2012-08-09 DIAGNOSIS — G43909 Migraine, unspecified, not intractable, without status migrainosus: Secondary | ICD-10-CM | POA: Diagnosis present

## 2012-08-09 DIAGNOSIS — S88119A Complete traumatic amputation at level between knee and ankle, unspecified lower leg, initial encounter: Secondary | ICD-10-CM

## 2012-08-09 DIAGNOSIS — Z96649 Presence of unspecified artificial hip joint: Secondary | ICD-10-CM

## 2012-08-09 DIAGNOSIS — E119 Type 2 diabetes mellitus without complications: Secondary | ICD-10-CM

## 2012-08-09 DIAGNOSIS — N186 End stage renal disease: Secondary | ICD-10-CM | POA: Diagnosis present

## 2012-08-09 HISTORY — DX: Anemia, unspecified: D64.9

## 2012-08-09 HISTORY — DX: Chronic systolic (congestive) heart failure: I50.22

## 2012-08-09 HISTORY — DX: Type 2 diabetes mellitus with unspecified complications: E11.8

## 2012-08-09 HISTORY — DX: Pulmonary hypertension, unspecified: I27.20

## 2012-08-09 LAB — BASIC METABOLIC PANEL
BUN: 44 mg/dL — ABNORMAL HIGH (ref 6–23)
CO2: 25 mEq/L (ref 19–32)
Calcium: 8.9 mg/dL (ref 8.4–10.5)
Chloride: 101 mEq/L (ref 96–112)
Creatinine, Ser: 6.77 mg/dL — ABNORMAL HIGH (ref 0.50–1.10)
GFR calc Af Amer: 7 mL/min — ABNORMAL LOW (ref >90)
GFR calc non Af Amer: 6 mL/min — ABNORMAL LOW (ref >90)
Glucose, Bld: 87 mg/dL (ref 70–99)
Potassium: 7.4 mEq/L (ref 3.5–5.1)
Sodium: 139 mEq/L (ref 135–145)

## 2012-08-09 LAB — COMPREHENSIVE METABOLIC PANEL
ALT: 18 U/L (ref 0–35)
AST: 28 U/L (ref 0–37)
Alkaline Phosphatase: 158 U/L — ABNORMAL HIGH (ref 39–117)
GFR calc Af Amer: 7 mL/min — ABNORMAL LOW (ref >90)
Glucose, Bld: 164 mg/dL — ABNORMAL HIGH (ref 70–99)
Potassium: 6.3 mEq/L (ref 3.5–5.1)
Sodium: 139 mEq/L (ref 135–145)
Total Protein: 7.8 g/dL (ref 6.0–8.3)

## 2012-08-09 LAB — CBC
Hemoglobin: 11.3 g/dL — ABNORMAL LOW (ref 12.0–15.0)
MCHC: 33.6 g/dL (ref 30.0–36.0)
RDW: 13 % (ref 11.5–15.5)

## 2012-08-09 LAB — CBC WITH DIFFERENTIAL/PLATELET
Eosinophils Absolute: 0 10*3/uL (ref 0.0–0.7)
Lymphocytes Relative: 4 % — ABNORMAL LOW (ref 12–46)
Lymphs Abs: 0.4 10*3/uL — ABNORMAL LOW (ref 0.7–4.0)
MCH: 32.7 pg (ref 26.0–34.0)
Neutrophils Relative %: 92 % — ABNORMAL HIGH (ref 43–77)
Platelets: 193 10*3/uL (ref 150–400)
RBC: 3.94 MIL/uL (ref 3.87–5.11)
WBC: 9 10*3/uL (ref 4.0–10.5)

## 2012-08-09 LAB — PRO B NATRIURETIC PEPTIDE: Pro B Natriuretic peptide (BNP): 15853 pg/mL — ABNORMAL HIGH (ref 0–125)

## 2012-08-09 LAB — MAGNESIUM: Magnesium: 3 mg/dL — ABNORMAL HIGH (ref 1.5–2.5)

## 2012-08-09 LAB — LIPASE, BLOOD: Lipase: 25 U/L (ref 11–59)

## 2012-08-09 LAB — GLUCOSE, CAPILLARY
Glucose-Capillary: 138 mg/dL — ABNORMAL HIGH (ref 70–99)
Glucose-Capillary: 102 mg/dL — ABNORMAL HIGH (ref 70–99)
Glucose-Capillary: 59 mg/dL — ABNORMAL LOW (ref 70–99)
Glucose-Capillary: 110 mg/dL — ABNORMAL HIGH (ref 70–99)

## 2012-08-09 LAB — POCT I-STAT, CHEM 8
Calcium, Ion: 1.11 mmol/L — ABNORMAL LOW (ref 1.13–1.30)
Chloride: 108 mEq/L (ref 96–112)
Glucose, Bld: 147 mg/dL — ABNORMAL HIGH (ref 70–99)
HCT: 40 % (ref 36.0–46.0)
TCO2: 26 mmol/L (ref 0–100)

## 2012-08-09 LAB — PROTIME-INR
INR: 1.05 (ref 0.00–1.49)
Prothrombin Time: 13.6 seconds (ref 11.6–15.2)

## 2012-08-09 LAB — TSH: TSH: 2.27 u[IU]/mL (ref 0.350–4.500)

## 2012-08-09 LAB — MRSA PCR SCREENING: MRSA by PCR: NEGATIVE

## 2012-08-09 LAB — APTT: aPTT: 32 seconds (ref 24–37)

## 2012-08-09 MED ORDER — INSULIN ASPART 100 UNIT/ML ~~LOC~~ SOLN: 2.0000 [IU] | SUBCUTANEOUS | Status: DC

## 2012-08-09 MED ORDER — SODIUM CHLORIDE 0.9 % IJ SOLN: 3.0000 mL | INTRAMUSCULAR | Status: DC | PRN

## 2012-08-09 MED ORDER — SODIUM CHLORIDE 0.9 % IV SOLN: 250.0000 mL | INTRAVENOUS | Status: DC | PRN

## 2012-08-09 MED ORDER — SODIUM CHLORIDE 0.9 % IV SOLN: 100.0000 mL | INTRAVENOUS | Status: DC | PRN

## 2012-08-09 MED ORDER — NEPRO/CARBSTEADY PO LIQD
237.0000 mL | ORAL | Status: DC | PRN
  Filled 2012-08-09: qty 237

## 2012-08-09 MED ORDER — HEPARIN SODIUM (PORCINE) 1000 UNIT/ML DIALYSIS: 1000.0000 [IU] | INTRAMUSCULAR | Status: DC | PRN

## 2012-08-09 MED ORDER — CALCIUM GLUCONATE 10 % IV SOLN: 1.0000 g | Freq: Once | INTRAVENOUS | Status: DC

## 2012-08-09 MED ORDER — LIDOCAINE HCL (PF) 1 % IJ SOLN: 5.0000 mL | INTRAMUSCULAR | Status: DC | PRN

## 2012-08-09 MED ORDER — ASPIRIN 300 MG RE SUPP
300.0000 mg | RECTAL | Status: AC
  Filled 2012-08-09: qty 1

## 2012-08-09 MED ORDER — BIOTENE DRY MOUTH MT LIQD
15.0000 mL | Freq: Two times a day (BID) | OROMUCOSAL | Status: DC
  Administered 2012-08-09 – 2012-08-12 (×6): 15 mL via OROMUCOSAL

## 2012-08-09 MED ORDER — DEXTROSE 50 % IV SOLN: 25.0000 mL | Freq: Once | INTRAVENOUS | Status: AC | PRN

## 2012-08-09 MED ORDER — SODIUM BICARBONATE 8.4 % IV SOLN: 50.0000 meq | Freq: Once | INTRAVENOUS | Status: DC

## 2012-08-09 MED ORDER — HEPARIN SODIUM (PORCINE) 5000 UNIT/ML IJ SOLN
5000.0000 [IU] | Freq: Three times a day (TID) | INTRAMUSCULAR | Status: DC
  Administered 2012-08-09 – 2012-08-11 (×6): 5000 [IU] via SUBCUTANEOUS
  Filled 2012-08-09 (×12): qty 1

## 2012-08-09 MED ORDER — HEPARIN SODIUM (PORCINE) 1000 UNIT/ML DIALYSIS
100.0000 [IU]/kg | INTRAMUSCULAR | Status: DC | PRN
  Filled 2012-08-09: qty 8

## 2012-08-09 MED ORDER — CALCIUM GLUCONATE 10 % IV SOLN
INTRAVENOUS | Status: AC
  Filled 2012-08-09: qty 10

## 2012-08-09 MED ORDER — ASPIRIN 81 MG PO CHEW
324.0000 mg | CHEWABLE_TABLET | ORAL | Status: AC
  Administered 2012-08-09: 324 mg via ORAL
  Filled 2012-08-09: qty 3
  Filled 2012-08-09: qty 1

## 2012-08-09 MED ORDER — SODIUM CHLORIDE 0.9 % IV SOLN
1.0000 g | INTRAVENOUS | Status: AC
  Administered 2012-08-09: 1 g via INTRAVENOUS
  Filled 2012-08-09: qty 10

## 2012-08-09 MED ORDER — SODIUM CHLORIDE 0.9 % IJ SOLN: 3.0000 mL | Freq: Two times a day (BID) | INTRAMUSCULAR | Status: DC

## 2012-08-09 MED ORDER — DEXTROSE 50 % IV SOLN
INTRAVENOUS | Status: AC
  Administered 2012-08-09: 25 mL via INTRAVENOUS
  Filled 2012-08-09: qty 50

## 2012-08-09 MED ORDER — SODIUM CHLORIDE 0.9 % IV BOLUS (SEPSIS)
500.0000 mL | Freq: Once | INTRAVENOUS | Status: AC
  Administered 2012-08-09: 500 mL via INTRAVENOUS

## 2012-08-09 MED ORDER — SODIUM CHLORIDE 0.9 % IV SOLN
INTRAVENOUS | Status: AC
  Filled 2012-08-09: qty 100

## 2012-08-09 MED ORDER — LEVOTHYROXINE SODIUM 100 MCG IV SOLR
60.0000 ug | Freq: Every day | INTRAVENOUS | Status: DC
  Administered 2012-08-09: 60 ug via INTRAVENOUS
  Filled 2012-08-09 (×3): qty 5

## 2012-08-09 MED ORDER — LIDOCAINE-PRILOCAINE 2.5-2.5 % EX CREA
1.0000 "application " | TOPICAL_CREAM | CUTANEOUS | Status: DC | PRN
  Filled 2012-08-09: qty 5

## 2012-08-09 MED ORDER — ONDANSETRON HCL 4 MG/2ML IJ SOLN
4.0000 mg | Freq: Once | INTRAMUSCULAR | Status: AC
  Administered 2012-08-09: 4 mg via INTRAVENOUS
  Filled 2012-08-09: qty 2

## 2012-08-09 MED ORDER — DEXTROSE 5 % IV SOLN
0.5000 mg/h | INTRAVENOUS | Status: DC
  Administered 2012-08-09: 0.5 mg/h via INTRAVENOUS
  Filled 2012-08-09: qty 4

## 2012-08-09 MED ORDER — DOCUSATE SODIUM 100 MG PO CAPS
100.0000 mg | ORAL_CAPSULE | Freq: Two times a day (BID) | ORAL | Status: DC
  Administered 2012-08-10 – 2012-08-11 (×2): 100 mg via ORAL
  Filled 2012-08-09 (×8): qty 1

## 2012-08-09 MED ORDER — PENTAFLUOROPROP-TETRAFLUOROETH EX AERO: 1.0000 "application " | INHALATION_SPRAY | CUTANEOUS | Status: DC | PRN

## 2012-08-09 MED ORDER — DOXERCALCIFEROL 4 MCG/2ML IV SOLN
1.0000 ug | INTRAVENOUS | Status: DC
  Administered 2012-08-09: 1 ug via INTRAVENOUS
  Filled 2012-08-09 (×2): qty 2

## 2012-08-09 MED ORDER — NALOXONE HCL 1 MG/ML IJ SOLN: 0.2500 mg/h | INTRAVENOUS | Status: DC

## 2012-08-09 MED ORDER — NALOXONE HCL 0.4 MG/ML IJ SOLN
0.4000 mg | Freq: Once | INTRAMUSCULAR | Status: AC
  Administered 2012-08-09: 0.4 mg via INTRAVENOUS
  Filled 2012-08-09: qty 1

## 2012-08-09 NOTE — Progress Notes (Signed)
CRITICAL VALUE ALERT  Critical value received:  Potassium 7.4  Date of notification:  08/09/12  Time of notification:  1355  Critical value read back:yes  Nurse who received alert:  Judeth Porch  MD notified (1st page):  Not paged PA-C Bard Herbert on unit  Time of first page:  1400  MD notified (2nd page):  Time of second page:  Responding MD:  Bard Herbert PA-C, Dr. Casimiro Needle  Time MD responded:

## 2012-08-09 NOTE — Progress Notes (Signed)
Patient had a critical high K level today pre-HD, Bard Herbert PA-C and Dr. Casimiro Needle notified, pt was dialyzed on a 1K bath for 2 hours today to lower K level.

## 2012-08-09 NOTE — ED Notes (Signed)
Results of chem 8 given to dr. Manus Gunning

## 2012-08-09 NOTE — ED Notes (Addendum)
Pt with multiple periods of apnea, one episode lasting longer than 30 seconds when pt was difficult to arouse even with sternal rub; EDP made aware; pt given narcan with effect and admits to taking 60mg  morphine of her mothers last night; pt pupils pin point prior to narcan and now 3mm with reaction; pt desat to 30% when apnic

## 2012-08-09 NOTE — Consult Note (Signed)
[facility] Renal Consultation Note    Indication for Consultation:  Management of ESRD/hemodialysis; anemia, hypertension/volume and secondary hyperparathyroidism  HPI: [name] is a 67 y.o. female with ESRD on MWF dialysis at the [facility] center was brought to the ED by EMS.  In the ED she was lethargic with 2 episodes of respirtory arrest and bradycardia that responded to narcan.  She states she was out of her usual pain med which is Rx by Dr. Modena Jansky at [facility] and took her mother's morphine pill last night for her fibromyalgia. She remembers going to bed last night, but is vague about the events of this morning.  She denis SOB, CP, fever, chills, N,V,constipation. She said she had a little diarrhea on Saturday. She denies HA, visual changes, dysuria or urgency (makes small amount of urine).   Past Medical History  Diagnosis Date  . ESRD (end stage renal disease) on dialysis HD since 07/2008    [facility]; Mon, Wed, Friday  . Hypertension   . Diabetes mellitus type 2, controlled, with complications     diet controlled  . Anxiety   . Hypothyroid   . Fibromyalgia   . Chronic systolic congestive heart failure     EF 35-40% per 2D echo (03/2011)  . Diabetic retinopathy(362.0)   . PAD (peripheral artery disease)     s/p L BKA  . Major depression   . Morphea   . Osteoporosis   . Peripheral neuropathy   . Carpal tunnel syndrome, bilateral   . Secondary hyperparathyroidism   . Vitamin D deficiency   . Refusal of blood transfusions as patient is Jehovah's Witness   . CAD (coronary artery disease)     s/p MI x 2, s/p stent x 4 (05/2005 with DES placed at that time)  . PONV (postoperative nausea and vomiting)   . Hyperlipidemia   . Asthma   . Migraines   . Constipation   . Hemorrhoids   . Pulmonary hypertension     PA Peak pressure 57 mmHg per 2D echo (03/2011)  . Normocytic anemia     BL Hgb 10-12  //  Chronic, likely multifactorial AOCD and possible  iron deficiency component with transferrin sat < 20 historically.   Past Surgical History  Procedure Laterality Date  . 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 Cardiac catheterization  2006, 2007    pt had 4 stents placed  . Cholecystectomy  1987  . Hemiarthroplasty hip Left 10/2009  . Vaginal hysterectomy  1994    with BSO  . Cataract extraction, bilateral Bilateral 2000  . Peripheral arterial stent graft Bilateral     bilaterally "(left ~ 2010; right ~ 2011"  . Av fistula placement  ~ 2011    right upper arm  . Bypass graft  2006  . Joint replacement  2007    Partial placement Left Hip   Family History  Problem Relation Age of Onset  . Alcohol abuse Father   . Multiple myeloma Mother   . Diabetes type II Mother   . Hypertension Mother   . Cancer Mother   . Diabetes Mother   . Appendicitis Brother    Social History:  reports that she quit smoking about 23 years ago. Her smoking use included Cigarettes. She has a 60 pack-year smoking history. She has never used smokeless tobacco. She reports that  drinks alcohol. She reports that she does not use illicit drugs. Allergies  Allergen Reactions  . Other Anaphylaxis    peaches  . Pumpkin Seed Oil-Saw Palmetto-Zinc (Propalmex) Anaphylaxis  . Shrimp (Shellfish Allergy) Anaphylaxis  . Penicillins     Tongue swelling.     Current Facility-Administered Medications  Medication Dose Route Frequency Provider Last Rate Last Dose  . 0.9 %  sodium chloride infusion  250 mL Intravenous PRN Alyson Reedy, MD      . aspirin chewable tablet 324 mg  324 mg Oral NOW Alyson Reedy, MD       Or  . aspirin suppository 300 mg  300 mg Rectal NOW Alyson Reedy, MD      . docusate sodium (COLACE) capsule 100 mg  100 mg Oral BID Alyson Reedy, MD      . doxercalciferol (HECTOROL) injection 1 mcg  1 mcg Intravenous Q M,W,F-HD Sheffield Slider, PA-C      . heparin injection 5,000 Units  5,000 Units Subcutaneous Q8H Alyson Reedy, MD       . insulin aspart (novoLOG) injection 2-6 Units  2-6 Units Subcutaneous Q4H Alyson Reedy, MD      . levothyroxine (SYNTHROID, LEVOTHROID) injection 60 mcg  60 mcg Intravenous Daily Alyson Reedy, MD      . naloxone [facility]) 4 mg in dextrose 5 % 250 mL infusion  0.5 mg/hr Intravenous Continuous Glynn Octave, MD 31.3 mL/hr at 08/09/12 1036 0.5 mg/hr at 08/09/12 1036  . naloxone (NARCAN) 4 mg in dextrose 5 % 250 mL infusion  0.25 mg/hr Intravenous Continuous Alyson Reedy, MD      . sodium bicarbonate injection 50 mEq  50 mEq Intravenous Once Glynn Octave, MD      . sodium chloride 0.9 % injection 3 mL  3 mL Intravenous Q12H Alyson Reedy, MD      . sodium chloride 0.9 % injection 3 mL  3 mL Intravenous PRN Alyson Reedy, MD       Current Outpatient Prescriptions  Medication Sig Dispense Refill  . ALPRAZolam (XANAX) 0.25 MG tablet Take 0.25 mg by mouth 3 (three) times daily as needed. As needed for anxiety.      Marland Kitchen aspirin 81 MG tablet Take 81 mg by mouth daily.        . cetirizine (ZYRTEC) 10 MG tablet Take 10 mg by mouth daily.        . clonazePAM (KLONOPIN) 1 MG tablet Take 1 mg by mouth 2 (two) times daily.        . diphenhydrAMINE (BENADRYL) 25 mg capsule Take 25 mg by mouth every 6 (six) hours as needed. As needed for allergies.       Marland Kitchen docusate sodium (COLACE) 100 MG capsule Take 100 mg by mouth daily.        . folic acid-vitamin b complex-vitamin c-selenium-zinc (DIALYVITE) 3 MG TABS Take 1 tablet by mouth daily.        . hydrOXYzine (ATARAX/VISTARIL) 25 MG tablet Take 25 mg by mouth every 6 (six) hours as needed. As needed for itching.       . levothyroxine (SYNTHROID, LEVOTHROID) 112 MCG tablet Take 112 mcg by mouth daily.        Marland Kitchen loperamide (IMODIUM) 2 MG capsule Take 2 mg by mouth 4 (four) times daily as needed. As needed for loose stools.       . lovastatin (MEVACOR) 10 MG tablet Take 10 mg by mouth at bedtime.        . metoprolol  succinate (TOPROL-XL) 25 MG 24 hr  tablet Take 25 mg by mouth daily.       Marland Kitchen oxyCODONE-acetaminophen (PERCOCET) 5-325 MG per tablet Take 1 tablet by mouth every 6 (six) hours as needed. As needed for pain.      . promethazine (PHENERGAN) 25 MG tablet Take 25 mg by mouth every 8 (eight) hours as needed.         Labs: Basic Metabolic Panel:  Recent Labs Lab 08/09/12 0824 08/09/12 0932  NA 139 139  K 6.3* 6.4*  CL 98 108  CO2 21  --   GLUCOSE 164* 147*  BUN 40* 45*  CREATININE 6.60* 5.70*  CALCIUM 9.4  --    Liver Function Tests:  Recent Labs Lab 08/09/12 0824  AST 28  ALT 18  ALKPHOS 158*  BILITOT 0.2*  PROT 7.8  ALBUMIN 3.9    Recent Labs Lab 08/09/12 0824  LIPASE 25   CBC:  Recent Labs Lab 08/09/12 0824 08/09/12 0932  WBC 9.0  --   NEUTROABS 8.2*  --   HGB 12.9 13.6  HCT 40.3 40.0  MCV 102.3*  --   PLT 193  --    Cardiac Enzymes:  Recent Labs Lab 08/09/12 0852  TROPONINI <0.30   CBG:  Recent Labs Lab 08/09/12 1035  GLUCAP 138*   Studies/Results: Dg Chest Portable 1 View  08/09/2012  *RADIOLOGY REPORT*  Clinical Data: Fatigue.  Vomiting.  PORTABLE CHEST - 1 VIEW  Comparison: Single view of the chest 05/15/2011 and 04/09/2010.  Findings: There is mild cardiomegaly without edema.  Trace left pleural effusion is noted.  Lungs are clear.  No pneumothorax.  IMPRESSION: Cardiomegaly and trace left pleural effusion.   Original Report Authenticated By: Holley Dexter, M.D.    ROS: obtained after she had narcan; fuzzy about events after she went to bed last night.  Physical Exam: Filed Vitals:   08/09/12 1000 08/09/12 1030 08/09/12 1040 08/09/12 1045  BP: 164/92 112/53 112/53 102/53  Pulse: 31 67 63 59  Temp:      TempSrc:      Resp: 20 17 15 22   SpO2: 50% 96% 96% 90%     General: Well developed, under warming blanket, looks older than stated age Head: Normocephalic, atraumatic, sclera non-icteric, mucus membranes dry,, teeth coated poor remair Neck: Supple. Mild +  JVD. Lungs: Decreased BS without wheezes, rales, or rhonchi. Breathing is unlabored. Heart: RRR with S1 S2. No murmurs, rubs, or gallops appreciated. Abdomen: Soft, non-tender, non-distended with normoactive bowel sounds. No rebound/guarding. No obvious abdominal masses. Lower extremities: left AKA and right LE without edema or ischemic changes, no open wounds  Neuro: Mildly confused. Moves all extremities spontaneously. Dialysis Access: right upper AVF + bruit and thrill  Dialysis Orders: Center: MWF AF 3.75 hours, 400/A 1.5 Optiflux 180 right upper AVF std heparin Venofer 50/week, Epo 1600 TIW, hectorol 1 TIW Venofer 50/week  Recent labs:  Hgb 10.9 3/12  iTPH 48 2/26, 580 1/22, 61 12/27 and 413 11/25 - highly variable   Assessment/Plan: 1. Respiratory failure/hypothermia - presumed secondary to narcotics - exact med and amount unclear, resolved with narcan; WBC normal - no diff; CXR no infection. 2. ESRD -  MWF - HD today; 4 hours longer due ^ K; depending on pre HD K recheck may do 1 K bath x 2 hr then 2 K; awaiting information to verify meds 3. Hyperkalemia - HD today asap; s/p Ca gluconate in ED; recheck; peaked T on  tele 4. Hypertension/volume  - volume control 5. Anemia  - Hgb 12.9 up from last Hgb 10.9 3/12 - no ESA or venofer yet 6. Metabolic bone disease -  iPTH volatile; hectorol doses alternating 1 mcg vs 2 mcg; continue current 1 mcg q HD 7. Nutrition - NPO - advance as tolerated. 8. N,V,D - she denied N and V to me; follow 9. Polypharmacy - on xanax klonopin and oxycodone with prn atarax and phenergan 10. CAD hx MI and stents;- prox and mid RCA 2007 - Dr. Elsie Lincoln on statin-   Sheffield Slider, PA-C [facility] Beeper [phone] 08/09/2012, 11:17 AM   Addendum: repeat K 7.4 nonhemolyzed; still waiting on info from dialysis unit  Kinetics had been upper 70s as an outpt; Last AF 1000; will use 1 K x 2 hours and 2 K x 2 hours and recheck K  in am.  MBergman, PA-C  2:19 pm  Attending note: Agree with history and exam as articulated above. Admitted with accidental narcotic overdose.  Found to have hyperkalemia with potassium of 7.4 meq/l.  Dialysis urgently in progress on 1 K bath. Will recheck K in AM.  Aisley Whan C

## 2012-08-09 NOTE — ED Provider Notes (Signed)
History     CSN: [id number]  Arrival date & time 08/09/12  0757   First MD Initiated Contact with Patient 08/09/12 0813      Chief Complaint  Patient presents with  . Fatigue  . Emesis    (Consider location/radiation/quality/duration/timing/severity/associated sxs/prior treatment) HPI Comments: Dialysis patient brought by EMS with one-day history of nausea, vomiting and diarrhea. She's had decreased mental status and felt too sick to go to dialysis today. There has been GI virus going around her facility. No reported fever. She denies abdominal pain, chest pain or shortness of breath. Her last dialysis was  Friday. She is unable to say how many times she vomited.  The history is provided by the patient and the EMS personnel. The history is limited by the condition of the patient.    Past Medical History  Diagnosis Date  . ESRD (end stage renal disease) on dialysis HD since 07/2008    [facility]; Mon, Wed, Friday  . Hypertension   . Diabetes mellitus type 2, controlled, with complications     diet controlled  . Anxiety   . Hypothyroid   . Fibromyalgia   . Chronic systolic congestive heart failure     EF 35-40% per 2D echo (03/2011)  . Diabetic retinopathy(362.0)   . PAD (peripheral artery disease)     s/p L BKA  . Major depression   . Morphea   . Osteoporosis   . Peripheral neuropathy   . Carpal tunnel syndrome, bilateral   . Secondary hyperparathyroidism   . Vitamin D deficiency   . Refusal of blood transfusions as patient is Jehovah's Witness   . CAD (coronary artery disease)     s/p MI x 2, s/p stent x 4 (05/2005 with DES placed at that time)  . PONV (postoperative nausea and vomiting)   . Hyperlipidemia   . Asthma   . Migraines   . Constipation   . Hemorrhoids   . Pulmonary hypertension     PA Peak pressure 57 mmHg per 2D echo (03/2011)  . Normocytic anemia     BL Hgb 10-12  //  Chronic, likely multifactorial AOCD and possible iron deficiency component with  transferrin sat < 20 historically.    Past Surgical History  Procedure Laterality Date  . 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 Cardiac catheterization  2006, 2007    pt had 4 stents placed  . Cholecystectomy  1987  . Hemiarthroplasty hip Left 10/2009  . Vaginal hysterectomy  1994    with BSO  . Cataract extraction, bilateral Bilateral 2000  . Peripheral arterial stent graft Bilateral     bilaterally "(left ~ 2010; right ~ 2011"  . Av fistula placement  ~ 2011    right upper arm  . Bypass graft  2006  . Joint replacement  2007    Partial placement Left Hip    Family History  Problem Relation Age of Onset  . Alcohol abuse Father   . Multiple myeloma Mother   . Diabetes type II Mother   . Hypertension Mother   . Cancer Mother   . Diabetes Mother   . Appendicitis Brother     History  Substance Use Topics  . Smoking status: Former Smoker -- 2.00 packs/day for 30 years    Types: Cigarettes    Quit date: 04/07/1989  . Smokeless tobacco: Never Used  . Alcohol Use: 0.0 oz/week     Comment: "socially; I'm not an  alcoholic"    OB History   Grav Para Term Preterm Abortions TAB SAB Ect Mult Living                  Review of Systems  Constitutional: Positive for activity change, appetite change and fatigue. Negative for fever.  HENT: Negative for congestion and rhinorrhea.   Respiratory: Negative for cough, chest tightness and shortness of breath.   Cardiovascular: Negative for chest pain.  Gastrointestinal: Positive for nausea and vomiting. Negative for abdominal pain.  Genitourinary: Negative for dysuria and hematuria.  Musculoskeletal: Positive for myalgias and arthralgias.  Neurological: Positive for weakness. Negative for dizziness and headaches.  A complete 10 system review of systems was obtained and all systems are negative except as noted in the HPI and PMH.    Allergies  Other; Pumpkin seed oil-saw palmetto-zinc; Shrimp; and Penicillins  Home  Medications   Current Outpatient Rx  Name  Route  Sig  Dispense  Refill  . ALPRAZolam (XANAX) 0.25 MG tablet   Oral   Take 0.25 mg by mouth 3 (three) times daily as needed. As needed for anxiety.         Marland Kitchen aspirin 81 MG tablet   Oral   Take 81 mg by mouth daily.           . cetirizine (ZYRTEC) 10 MG tablet   Oral   Take 10 mg by mouth daily.           . clonazePAM (KLONOPIN) 1 MG tablet   Oral   Take 1 mg by mouth 2 (two) times daily.           . diphenhydrAMINE (BENADRYL) 25 mg capsule   Oral   Take 25 mg by mouth every 6 (six) hours as needed. As needed for allergies.          Marland Kitchen docusate sodium (COLACE) 100 MG capsule   Oral   Take 100 mg by mouth daily.           . folic acid-vitamin b complex-vitamin c-selenium-zinc (DIALYVITE) 3 MG TABS   Oral   Take 1 tablet by mouth daily.           . hydrOXYzine (ATARAX/VISTARIL) 25 MG tablet   Oral   Take 25 mg by mouth every 6 (six) hours as needed. As needed for itching.          . levothyroxine (SYNTHROID, LEVOTHROID) 112 MCG tablet   Oral   Take 112 mcg by mouth daily.           Marland Kitchen loperamide (IMODIUM) 2 MG capsule   Oral   Take 2 mg by mouth 4 (four) times daily as needed. As needed for loose stools.          . lovastatin (MEVACOR) 10 MG tablet   Oral   Take 10 mg by mouth at bedtime.           . metoprolol succinate (TOPROL-XL) 25 MG 24 hr tablet   Oral   Take 25 mg by mouth daily.          Marland Kitchen oxyCODONE-acetaminophen (PERCOCET) 5-325 MG per tablet   Oral   Take 1 tablet by mouth every 6 (six) hours as needed. As needed for pain.         . promethazine (PHENERGAN) 25 MG tablet   Oral   Take 25 mg by mouth every 8 (eight) hours as needed.  BP 102/53  Pulse 59  Temp(Src) 95.4 F (35.2 C) (Rectal)  Resp 22  SpO2 90%  Physical Exam  Constitutional: She is oriented to person, place, and time. She appears well-developed and well-nourished. No distress.  HENT:  Head:  Normocephalic and atraumatic.  Mouth/Throat: No oropharyngeal exudate.  Dry mucous membranes  Eyes: Conjunctivae and EOM are normal. Pupils are equal, round, and reactive to light.  Neck: Normal range of motion. Neck supple.  Cardiovascular: Normal rate, regular rhythm and normal heart sounds.   No murmur heard. Pulmonary/Chest: Effort normal and breath sounds normal. No respiratory distress.  Abdominal: Soft. There is no tenderness. There is no rebound and no guarding.  Musculoskeletal: Normal range of motion.  Left BKA  Neurological: She is alert and oriented to person, place, and time. No cranial nerve deficit. She exhibits normal muscle tone. Coordination normal.  Skin: Skin is warm.    ED Course  Procedures (including critical care time)  Labs Reviewed  CBC WITH DIFFERENTIAL - Abnormal; Notable for the following:    MCV 102.3 (*)    Neutrophils Relative 92 (*)    Neutro Abs 8.2 (*)    Lymphocytes Relative 4 (*)    Lymphs Abs 0.4 (*)    All other components within normal limits  COMPREHENSIVE METABOLIC PANEL - Abnormal; Notable for the following:    Potassium 6.3 (*)    Glucose, Bld 164 (*)    BUN 40 (*)    Creatinine, Ser 6.60 (*)    Alkaline Phosphatase 158 (*)    Total Bilirubin 0.2 (*)    GFR calc non Af Amer 6 (*)    GFR calc Af Amer 7 (*)    All other components within normal limits  LACTIC ACID, PLASMA - Abnormal; Notable for the following:    Lactic Acid, Venous 5.9 (*)    All other components within normal limits  GLUCOSE, CAPILLARY - Abnormal; Notable for the following:    Glucose-Capillary 138 (*)    All other components within normal limits  POCT I-STAT, CHEM 8 - Abnormal; Notable for the following:    Potassium 6.4 (*)    BUN 45 (*)    Creatinine, Ser 5.70 (*)    Glucose, Bld 147 (*)    Calcium, Ion 1.11 (*)    All other components within normal limits  CULTURE, BLOOD (ROUTINE X 2)  CULTURE, BLOOD (ROUTINE X 2)  CULTURE, BLOOD (ROUTINE X 2)   CULTURE, BLOOD (ROUTINE X 2)  CULTURE, EXPECTORATED SPUTUM-ASSESSMENT  URINE CULTURE  TROPONIN I  LIPASE, BLOOD  URINALYSIS, ROUTINE W REFLEX MICROSCOPIC  TSH  CBC  TSH  T4, FREE  COMPREHENSIVE METABOLIC PANEL  MAGNESIUM  PHOSPHORUS  AMYLASE  LIPASE, BLOOD  LACTIC ACID, PLASMA  PROCALCITONIN  PRO B NATRIURETIC PEPTIDE  PROTIME-INR  APTT  CBC WITH DIFFERENTIAL  URINALYSIS, ROUTINE W REFLEX MICROSCOPIC  URINE RAPID DRUG SCREEN (HOSP PERFORMED)  URINALYSIS, ROUTINE W REFLEX MICROSCOPIC   Dg Chest Portable 1 View  08/09/2012  *RADIOLOGY REPORT*  Clinical Data: Fatigue.  Vomiting.  PORTABLE CHEST - 1 VIEW  Comparison: Single view of the chest 05/15/2011 and 04/09/2010.  Findings: There is mild cardiomegaly without edema.  Trace left pleural effusion is noted.  Lungs are clear.  No pneumothorax.  IMPRESSION: Cardiomegaly and trace left pleural effusion.   Original Report Authenticated By: Holley Dexter, M.D.      1. Acute respiratory failure   2. Altered mental status   3. Diabetes mellitus  type II   4. ESRD (end stage renal disease) on dialysis   5. Hyperkalemia   6. Hypothyroid   7. Narcotic overdose, initial encounter       MDM  One-day history of nausea, vomiting, diarrhea with somnolence hyperthermia. Did not have dialysis today.  Peaked T waves on EKG noted. Given empiric calcium.  K 6.4  D.w Dr. Lowell Guitar who will arrange for dialysis today.   Patient had a 30 second episode of apnea with unresponsiveness and hypoxia in the ED. She responded to sternal rub. Her sats are in the high 90s on nasal cannula she is awake and alert denies complaints. Patient was given Narcan with improvement. There is no change on repeat EKG. She admits to taking Vicodin today and later told nursing staff that she took her mother's morphine tablet. Narcan drip is ordered.  Discussed with critical care who has evaluated the patient. They will admit for airway monitoring. Patient is  awake and alert and not hypoxic on nasal cannula.   Date: 08/09/2012  Rate: 71  Rhythm: normal sinus rhythm  QRS Axis: normal  Intervals: normal  ST/T Wave abnormalities: nonspecific T wave changes  Conduction Disutrbances:none  Narrative Interpretation: peaked T wavez  Old EKG Reviewed: changes noted   Date: 08/09/2012  Rate: 64  Rhythm: normal sinus  QRS Axis: normal  Intervals: normal  ST/T Wave abnormalities: normal  Conduction Disutrbances:none  Narrative Interpretation:   Old EKG Reviewed: unchanged   CRITICAL CARE Performed by: Glynn Octave   Total critical care time: 45  Critical care time was exclusive of separately billable procedures and treating other patients.  Critical care was necessary to treat or prevent imminent or life-threatening deterioration.  Critical care was time spent personally by me on the following activities: development of treatment plan with patient and/or surrogate as well as nursing, discussions with consultants, evaluation of patient's response to treatment, examination of patient, obtaining history from patient or surrogate, ordering and performing treatments and interventions, ordering and review of laboratory studies, ordering and review of radiographic studies, pulse oximetry and re-evaluation of patient's condition.   Glynn Octave, MD 08/09/12 [phone]

## 2012-08-09 NOTE — Progress Notes (Signed)
Hypoglycemic Event  CBG: 54  Treatment: amp D5  Symptoms: none  Follow-up CBG: Time:2034 CBG Result:110  Possible Reasons for Event: NPO  Comments/MD notified:    Doree Albee  Remember to initiate Hypoglycemia Order Set & complete

## 2012-08-09 NOTE — Progress Notes (Signed)
**Note De-Identified  Obfuscation** RT note: RT unable to collect ABG due to limited access, bilateral dialysis grafts.  Dr. Manus Gunning aware and placed ABG on hold.  RT to continue to monitor.

## 2012-08-09 NOTE — ED Notes (Signed)
Per EMS: pt from home c/o lethargy and N/V/D since yesterday; pt due for dialysis today but did not go; pt hypotensive at present

## 2012-08-09 NOTE — H&P (Addendum)
PULMONARY  / CRITICAL CARE MEDICINE  Name: [name] MRN: [id number] DOB: [birth year]    ADMISSION DATE:  08/09/2012 CONSULTATION DATE:  08/09/12  REFERRING MD :  EDP  CHIEF COMPLAINT:  Respiratory failure.  BRIEF PATIENT DESCRIPTION: 67 year old ESRD-HD who missed dialysis on day of admission, called EMS for N/V.  In the ED the patient was very lethargic, suffered two episodes of respiratory arrest and bradycardia that seemed to respond to narcan.  The patient reports she took Vicodin on the day prior to admission.  She is intermittently lethargic and unable to provide further history.  SIGNIFICANT EVENTS / STUDIES:  3/17>>>Respiratory arrest responding to narcan.  LINES / TUBES: PIV  CULTURES: Blood 3/17>>> Urine 3/17>>> Sputum 3/17>>>  ANTIBIOTICS: None  PAST MEDICAL HISTORY :  Past Medical History  Diagnosis Date  . ESRD (end stage renal disease) on dialysis HD since 07/2008    [facility]; Mon, Wed, Friday  . Hypertension   . Diabetes mellitus type 2, controlled, with complications     diet controlled  . Anxiety   . Hypothyroid   . Fibromyalgia   . Chronic systolic congestive heart failure     EF 35-40% per 2D echo (03/2011)  . Diabetic retinopathy(362.0)   . PAD (peripheral artery disease)     s/p L BKA  . Major depression   . Morphea   . Osteoporosis   . Peripheral neuropathy   . Carpal tunnel syndrome, bilateral   . Secondary hyperparathyroidism   . Vitamin D deficiency   . Refusal of blood transfusions as patient is Jehovah's Witness   . CAD (coronary artery disease)     s/p MI x 2, s/p stent x 4 (05/2005 with DES placed at that time)  . PONV (postoperative nausea and vomiting)   . Hyperlipidemia   . Asthma   . Migraines   . Constipation   . Hemorrhoids   . Pulmonary hypertension     PA Peak pressure 57 mmHg per 2D echo (03/2011)  . Normocytic anemia     BL Hgb 10-12  //  Chronic, likely multifactorial AOCD and possible iron deficiency component  with transferrin sat < 20 historically.   Past Surgical History  Procedure Laterality Date  . 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 Cardiac catheterization  2006, 2007    pt had 4 stents placed  . Cholecystectomy  1987  . Hemiarthroplasty hip Left 10/2009  . Vaginal hysterectomy  1994    with BSO  . Cataract extraction, bilateral Bilateral 2000  . Peripheral arterial stent graft Bilateral     bilaterally "(left ~ 2010; right ~ 2011"  . Av fistula placement  ~ 2011    right upper arm  . Bypass graft  2006  . Joint replacement  2007    Partial placement Left Hip   Prior to Admission medications   Medication Sig Start Date End Date Taking? Authorizing Provider  ALPRAZolam (XANAX) 0.25 MG tablet Take 0.25 mg by mouth 3 (three) times daily as needed. As needed for anxiety.   Yes Historical Provider, MD  aspirin 81 MG tablet Take 81 mg by mouth daily.     Yes Historical Provider, MD  cetirizine (ZYRTEC) 10 MG tablet Take 10 mg by mouth daily.     Yes Historical Provider, MD  clonazePAM (KLONOPIN) 1 MG tablet Take 1 mg by mouth 2 (two) times daily.     Yes Historical Provider, MD  diphenhydrAMINE (BENADRYL) 25 mg capsule Take 25 mg by mouth every 6 (six) hours as needed. As needed for allergies.    Yes Historical Provider, MD  docusate sodium (COLACE) 100 MG capsule Take 100 mg by mouth daily.     Yes Historical Provider, MD  folic acid-vitamin b complex-vitamin c-selenium-zinc (DIALYVITE) 3 MG TABS Take 1 tablet by mouth daily.     Yes Historical Provider, MD  hydrOXYzine (ATARAX/VISTARIL) 25 MG tablet Take 25 mg by mouth every 6 (six) hours as needed. As needed for itching.    Yes Historical Provider, MD  levothyroxine (SYNTHROID, LEVOTHROID) 112 MCG tablet Take 112 mcg by mouth daily.     Yes Historical Provider, MD  loperamide (IMODIUM) 2 MG capsule Take 2 mg by mouth 4 (four) times daily as needed. As needed for loose stools.    Yes Historical Provider, MD  lovastatin  (MEVACOR) 10 MG tablet Take 10 mg by mouth at bedtime.     Yes Historical Provider, MD  metoprolol succinate (TOPROL-XL) 25 MG 24 hr tablet Take 25 mg by mouth daily.    Yes Historical Provider, MD  oxyCODONE-acetaminophen (PERCOCET) 5-325 MG per tablet Take 1 tablet by mouth every 6 (six) hours as needed. As needed for pain.   Yes Historical Provider, MD  promethazine (PHENERGAN) 25 MG tablet Take 25 mg by mouth every 8 (eight) hours as needed.     Yes Historical Provider, MD   Allergies  Allergen Reactions  . Other Anaphylaxis    peaches  . Pumpkin Seed Oil-Saw Palmetto-Zinc (Propalmex) Anaphylaxis  . Shrimp (Shellfish Allergy) Anaphylaxis  . Penicillins     Tongue swelling.    FAMILY HISTORY:  Family History  Problem Relation Age of Onset  . Alcohol abuse Father   . Multiple myeloma Mother   . Diabetes type II Mother   . Hypertension Mother   . Cancer Mother   . Diabetes Mother   . Appendicitis Brother    SOCIAL HISTORY:  reports that she quit smoking about 23 years ago. Her smoking use included Cigarettes. She has a 60 pack-year smoking history. She has never used smokeless tobacco. She reports that  drinks alcohol. She reports that she does not use illicit drugs.  REVIEW OF SYSTEMS:  Unattainable, patient is confused.  SUBJECTIVE: Unable to provide much history, confused and hard of hearing.  VITAL SIGNS: Temp:  [94 F (34.4 C)-95.4 F (35.2 C)] 95.4 F (35.2 C) (03/17 0959) Pulse Rate:  [69] 69 (03/17 0829) BP: (129)/(53) 129/53 mmHg (03/17 0829) SpO2:  [100 %] 100 % (03/17 0829) HEMODYNAMICS:   VENTILATOR SETTINGS:   INTAKE / OUTPUT: Intake/Output   None    PHYSICAL EXAMINATION: General:  Chronically ill appearing female, shivering. Neuro:  Confused, moving all ext to command.  Improves with narcan. HEENT:  [place]/AT, PERRL, EOM-I, supple neck, +JVD and -LAN. Cardiovascular:  RRR, Nl S1/S2, -M/R/G. Lungs:  CTA bilaterally but decreased BS and shallow. Abdomen:   Soft, NT, ND and +BS. Musculoskeletal:  -edema and -tenderness.  L BKA. Skin:  Thin but intact.  LABS:  Recent Labs Lab 08/09/12 0824 08/09/12 0852 08/09/12 0932  HGB 12.9  --  13.6  WBC 9.0  --   --   PLT 193  --   --   NA 139  --  139  K 6.3*  --  6.4*  CL 98  --  108  CO2 21  --   --   GLUCOSE 164*  --  147*  BUN 40*  --  45*  CREATININE 6.60*  --  5.70*  CALCIUM 9.4  --   --   AST 28  --   --   ALT 18  --   --   ALKPHOS 158*  --   --   BILITOT 0.2*  --   --   PROT 7.8  --   --   ALBUMIN 3.9  --   --   TROPONINI  --  <0.30  --    No results found for this basename: GLUCAP,  in the last 168 hours  CXR: Cardiomegally with small left sided pleural effusion.  ASSESSMENT / PLAN:  PULMONARY A: Respiratory failure in the settings of narcotic use. P:   - Narcan drip. - Titrate O2 down as tolerated for sat of 8-92%.  CARDIOVASCULAR A: Normotensive and RRR at this time.  ST segment peaked due to hyperkalemia. P:  - EKG. - Tele monitoring. - Calcium gluconate. - Dialysis.  RENAL A:  ESRD-HD, hyperkalemia. P:   - HD per renal. - Calcium/bicarb/glucose/insulin as done in ED.  GASTROINTESTINAL A:  N/V, ?narcotic related vs metabolic, no evidence of infection at this time. P:   - Colace for diarrhea. - Monitor.  HEMATOLOGIC A:  No acute abnormalities.  Patient is Jehova's witness. P:  - No transfusion. - Monitor CBC.  INFECTIOUS A:  No signs of active infection. P:   - Pan culture but no abx for now.  ENDOCRINE A:  Hypothyroid and DM.   P:   - ISS. - IV synthroid 1/2 dose of PO home dose.  NEUROLOGIC A:  Confused but responds to narcan. P:   - Narcan drip. - Monitor. - Patient on beta blockers, narcotics and xanax, will hold all for now until neuro is more stable but I am unaware as to how much xanax she uses, will need to be on alert for withdrawal.  I have personally obtained a history, examined the patient, evaluated laboratory and imaging  results, formulated the assessment and plan and placed orders.  CRITICAL CARE: The patient is critically ill with multiple organ systems failure and requires high complexity decision making for assessment and support, frequent evaluation and titration of therapies, application of advanced monitoring technologies and extensive interpretation of multiple databases. Critical Care Time devoted to patient care services described in this note is 45 minutes.    Pulmonary and Critical Care Medicine [facility] Pager: [phone]  08/09/2012, 10:04 AM

## 2012-08-10 LAB — T4, FREE: Free T4: 1.07 ng/dL (ref 0.80–1.80)

## 2012-08-10 LAB — BASIC METABOLIC PANEL
BUN: 16 mg/dL (ref 6–23)
GFR calc Af Amer: 14 mL/min — ABNORMAL LOW (ref >90)
GFR calc non Af Amer: 12 mL/min — ABNORMAL LOW (ref >90)
Potassium: 4.3 mEq/L (ref 3.5–5.1)
Sodium: 136 mEq/L (ref 135–145)

## 2012-08-10 LAB — GLUCOSE, CAPILLARY
Glucose-Capillary: 82 mg/dL (ref 70–99)
Glucose-Capillary: 82 mg/dL (ref 70–99)
Glucose-Capillary: 88 mg/dL (ref 70–99)
Glucose-Capillary: 84 mg/dL (ref 70–99)

## 2012-08-10 LAB — CBC
Hemoglobin: 10.9 g/dL — ABNORMAL LOW (ref 12.0–15.0)
MCHC: 32.3 g/dL (ref 30.0–36.0)
RBC: 3.39 MIL/uL — ABNORMAL LOW (ref 3.87–5.11)
WBC: 7.4 10*3/uL (ref 4.0–10.5)

## 2012-08-10 LAB — MAGNESIUM: Magnesium: 2.2 mg/dL (ref 1.5–2.5)

## 2012-08-10 LAB — PHOSPHORUS: Phosphorus: 4.5 mg/dL (ref 2.3–4.6)

## 2012-08-10 MED ORDER — CLONAZEPAM 1 MG PO TABS
1.0000 mg | ORAL_TABLET | Freq: Two times a day (BID) | ORAL | Status: DC
  Administered 2012-08-10 – 2012-08-12 (×4): 1 mg via ORAL
  Filled 2012-08-10 (×4): qty 1

## 2012-08-10 MED ORDER — ASPIRIN 81 MG PO TABS: 81.0000 mg | ORAL_TABLET | Freq: Every day | ORAL | Status: DC

## 2012-08-10 MED ORDER — DOCUSATE SODIUM 100 MG PO CAPS: 100.0000 mg | ORAL_CAPSULE | Freq: Every day | ORAL | Status: DC

## 2012-08-10 MED ORDER — LEVOTHYROXINE SODIUM 112 MCG PO TABS
112.0000 ug | ORAL_TABLET | Freq: Every day | ORAL | Status: DC
  Administered 2012-08-11 – 2012-08-12 (×2): 112 ug via ORAL
  Filled 2012-08-10 (×3): qty 1

## 2012-08-10 MED ORDER — DEXTROSE 10 % IV SOLN
INTRAVENOUS | Status: DC
  Administered 2012-08-10: 1000 mL via INTRAVENOUS

## 2012-08-10 MED ORDER — METOPROLOL SUCCINATE ER 25 MG PO TB24
25.0000 mg | ORAL_TABLET | Freq: Every day | ORAL | Status: DC
  Administered 2012-08-10 – 2012-08-12 (×2): 25 mg via ORAL
  Filled 2012-08-10 (×3): qty 1

## 2012-08-10 MED ORDER — ASPIRIN EC 81 MG PO TBEC
81.0000 mg | DELAYED_RELEASE_TABLET | Freq: Every day | ORAL | Status: DC
  Administered 2012-08-10 – 2012-08-12 (×2): 81 mg via ORAL
  Filled 2012-08-10 (×3): qty 1

## 2012-08-10 NOTE — Discharge Summary (Signed)
Physician Discharge Summary  Patient ID: [name] MRN: [id number] DOB/AGE: 1946/04/04 67 y.o.  Admit date: 08/09/2012 Discharge date: 08/10/2012    Discharge Diagnoses:  Active Problems:   Acute respiratory failure   Altered mental status   Narcotic overdose   Hyperkalemia    Hospital Summary: [name] is a 67 y.o. y/o female, Jehovah's Witness, with a complex PMH to include HTN, Hypothyroidism, Systolic CHF (EF 40-98% 11/12), PAH (per ECHO), CAD s/p MI x2 / stent x4, DM, PAD s/p L BKA and ESRD on HD (MWF at [facility]) who was admitted to [facility] on 3/17 with nausea / vomiting, and AMS.  Patient reportedly called EMS for episodes of nausea & vomiting.  Missed her hemodialysis on day of admission.  In ER she was very lethargic and suffered two episodes of respiratory arrest with bradycardia that responded to narcan.  She now indicates that she had run out of her normal home pain medications and took some of her mothers narcotics for her fibromyalgia pain.  No intent of self harm.  She was admitted to ICU for close observation, treated with narcan gtt and hemodialysis with resolution of lethargy / AMS.  Hospital course with noted hyperkalemia in setting of chronic renal failure and missed episode of HD. Mild peaked ST segment in setting of hyperkalemia that resolved with HD.  Nausea & vomiting thought related to metabolic derangements.  No evidence of acute infectious process during admission.  Blood glucose controlled with sliding scale insulin.  Patient is diet controlled at home.  Synthroid maintained during admit.           DISCHARGE INSTRUCTIONS BY DIAGNOSIS    Accidental Narcotic Overdose Altered Mental Status / Acute Encephalopathy   Discharge Instructions: -resume home medications -no Rx given at time of discharge as [facility] will manage outpatient prescriptions (discussed with [facility]) -follow up with [facility], they will call her for appt   ESRD  Hyperkalemia -  resolved.  Discharge Instructions: -follow up with HD on 3/19 as previously planned  Hypothyroidism Diabetes  Discharge Instructions: -continue diabetic diet -continue synthroid  CONSULTS Nephrology - Dr. Lowell Guitar  MICRO DATA  Blood 3/17>>>ng  Urine 3/17>>>  Sputum 3/17>>>   ANTIBIOTICS - none   SIGNIFICANT EVENTS / DIAGNOSTIC STUDIES 3/17 - Respiratory arrest responding to narcan 3/19 - Awake, alert, no distress   Discharge Exam: General: Chronically ill appearing female  Neuro: Alert, interactive, moving all ext to command.  HEENT: [place]/AT, PERRL, EOM-I, supple neck, +JVD and -LAN.  Cardiovascular: RRR, Nl S1/S2, -M/R/G.  Lungs: CTA bilaterally  Abdomen: Soft, NT, ND and +BS.  Musculoskeletal: -edema and -tenderness. L BKA.  Skin: Thin but intact    Filed Vitals:   08/10/12 0900 08/10/12 1000 08/10/12 1100 08/10/12 1140  BP: 109/47 132/57 144/76   Pulse: 68 69 66   Temp:    98 F (36.7 C)  TempSrc:    Oral  Resp: 8 13 16    Height:      Weight:      SpO2: 97% 99% 98%      Discharge Labs  BMET  Recent Labs Lab 08/09/12 0824 08/09/12 0905 08/09/12 0932 08/09/12 1300 08/10/12 0250  NA 139  --  139 139 136  K 6.3*  --  6.4* 7.4* 4.3  CL 98  --  108 101 96  CO2 21  --   --  25 29  GLUCOSE 164*  --  147* 87 82  BUN 40*  --  45* 44* 16  CREATININE 6.60*  --  5.70* 6.77* 3.73*  CALCIUM 9.4  --   --  8.9 8.9  MG  --  3.0*  --   --  2.2  PHOS  --  8.5*  --   --  4.5   CBC  Recent Labs Lab 08/09/12 0824 08/09/12 0932 08/09/12 1230 08/10/12 0250  HGB 12.9 13.6 11.3* 10.9*  HCT 40.3 40.0 33.6* 33.7*  WBC 9.0  --  6.6 7.4  PLT 193  --  179 165   Anti-Coagulation  Recent Labs Lab 08/09/12 1230  INR 1.05    Discharge Orders   Future Appointments Provider Department Dept Phone   10/21/2012 2:00 PM [facility] Lab 3 [facility] [phone]   10/21/2012 2:30 PM [facility] Lab 3 [facility]  [facility] [phone]   10/21/2012 3:00 PM Evern Bio, NP [facility] [phone]   Future Orders Complete By Expires     Call MD for:  difficulty breathing, headache or visual disturbances  As directed     Call MD for:  persistant dizziness or light-headedness  As directed     Call MD for:  severe uncontrolled pain  As directed     Call MD for:  temperature >100.4  As directed     Diet - low sodium heart healthy  As directed     Discharge instructions  As directed     Comments:      Do not share medications with others.  Only take what is prescribed for you. Follow up with HD as previously scheduled.    Increase activity slowly  As directed        Follow-up Information   Follow up with Thane Edu, MD On 08/10/2012. (Office will call you.  )    Contact information:   [address] [place] [address] [phone]       Follow up with Hemodialysis - [facility]  On 08/11/2012. (As previously scheduled. )         Medication List    TAKE these medications       ALPRAZolam 0.25 MG tablet  Commonly known as:  XANAX  Take 0.25 mg by mouth 3 (three) times daily as needed. As needed for anxiety.     aspirin 81 MG tablet  Take 81 mg by mouth daily.     cetirizine 10 MG tablet  Commonly known as:  ZYRTEC  Take 10 mg by mouth daily.     clonazePAM 1 MG tablet  Commonly known as:  KLONOPIN  Take 1 mg by mouth 2 (two) times daily.     diphenhydrAMINE 25 mg capsule  Commonly known as:  BENADRYL  Take 25 mg by mouth every 6 (six) hours as needed. As needed for allergies.     docusate sodium 100 MG capsule  Commonly known as:  COLACE  Take 100 mg by mouth daily.     folic acid-vitamin b complex-vitamin c-selenium-zinc 3 MG Tabs  Take 1 tablet by mouth daily.     hydrOXYzine 25 MG tablet  Commonly known as:  ATARAX/VISTARIL  Take 25 mg by mouth every 6 (six) hours as needed. As needed for itching.     levothyroxine 112  MCG tablet  Commonly known as:  SYNTHROID, LEVOTHROID  Take 112 mcg by mouth daily.     loperamide 2 MG capsule  Commonly known as:  IMODIUM  Take 2 mg by  mouth 4 (four) times daily as needed. As needed for loose stools.     lovastatin 10 MG tablet  Commonly known as:  MEVACOR  Take 10 mg by mouth at bedtime.     metoprolol succinate 25 MG 24 hr tablet  Commonly known as:  TOPROL-XL  Take 25 mg by mouth daily.     oxyCODONE-acetaminophen 5-325 MG per tablet  Commonly known as:  PERCOCET/ROXICET  Take 1 tablet by mouth every 6 (six) hours as needed. As needed for pain.     promethazine 25 MG tablet  Commonly known as:  PHENERGAN  Take 25 mg by mouth every 8 (eight) hours as needed.          Disposition:   Discharged Condition: [name] has met maximum benefit of inpatient care and is medically stable and cleared for discharge.  Patient is pending follow up as above.      Time spent on disposition:  Greater than 35 minutes.   Signed: Canary Brim, NP-C [facility] Pgr: [phone]

## 2012-08-10 NOTE — Progress Notes (Signed)
Report called to [facility].  Transported in bed on monitor to [id number] with RN.  VSS. No distress noted.

## 2012-08-10 NOTE — Progress Notes (Signed)
PT has refused CPAP machine for the night. PT says she does not wear a machine at home. PT also states that she does not want anything too complicated to sleep with and she does not think she can handle the mask covering her face. PT is currently on 3 lpm nasal cannula with o2 sats 100%. RT will continue to assist as needed.

## 2012-08-10 NOTE — Progress Notes (Signed)
Assessment/Plan:  1. Respiratory failure/hypothermia - presumed secondary to narcotics - resolved with narcan. 2. ESRD - MWF - HD Wednesday planned 3    Hyperkalemia, resolved 4    Polypharmacy -   Subjective: Interval History: Some hypoglycemia today  Objective: Vital signs in last 24 hours: Temp:  [97.7 F (36.5 C)-98.5 F (36.9 C)] 98.3 F (36.8 C) (03/18 0747) Pulse Rate:  [50-78] 67 (03/18 0700) Resp:  [8-18] 8 (03/18 0700) BP: (88-222)/(37-104) 129/67 mmHg (03/18 0700) SpO2:  [92 %-100 %] 95 % (03/18 0700) Weight:  [72.6 kg (160 lb 0.9 oz)-75.1 kg (165 lb 9.1 oz)] 72.6 kg (160 lb 0.9 oz) (03/18 0500) Weight change:   Intake/Output from previous day: 03/17 0701 - 03/18 0700 In: 379.1 [I.V.:379.1] Out: 1500  Intake/Output this shift: Total I/O In: 40 [I.V.:40] Out: -   General appearance: alert, cooperative and appears stated age Chest wall: no tenderness Cardio: regular rate and rhythm, S1, S2 normal, no murmur, click, rub or gallop GI: soft, non-tender; bowel sounds normal; no masses,  no organomegaly Extremities: extremities normal, atraumatic, no cyanosis or edema  Lab Results:  Recent Labs  08/09/12 1230 08/10/12 0250  WBC 6.6 7.4  HGB 11.3* 10.9*  HCT 33.6* 33.7*  PLT 179 165   BMET:  Recent Labs  08/09/12 1300 08/10/12 0250  NA 139 136  K 7.4* 4.3  CL 101 96  CO2 25 29  GLUCOSE 87 82  BUN 44* 16  CREATININE 6.77* 3.73*  CALCIUM 8.9 8.9   No results found for this basename: PTH,  in the last 72 hours Iron Studies: No results found for this basename: IRON, TIBC, TRANSFERRIN, FERRITIN,  in the last 72 hours Studies/Results: Dg Chest Portable 1 View  08/09/2012  *RADIOLOGY REPORT*  Clinical Data: Fatigue.  Vomiting.  PORTABLE CHEST - 1 VIEW  Comparison: Single view of the chest 05/15/2011 and 04/09/2010.  Findings: There is mild cardiomegaly without edema.  Trace left pleural effusion is noted.  Lungs are clear.  No pneumothorax.  IMPRESSION:  Cardiomegaly and trace left pleural effusion.   Original Report Authenticated By: Holley Dexter, M.D.    Scheduled: . antiseptic oral rinse  15 mL Mouth Rinse BID  . docusate sodium  100 mg Oral BID  . doxercalciferol  1 mcg Intravenous Q M,W,F-HD  . heparin  5,000 Units Subcutaneous Q8H  . insulin aspart  2-6 Units Subcutaneous Q4H  . levothyroxine  60 mcg Intravenous Daily  . sodium bicarbonate  50 mEq Intravenous Once    LOS: 1 day   Nesreen Albano C 08/10/2012,11:08 AM

## 2012-08-10 NOTE — Clinical Social Work Psychosocial (Signed)
     Clinical Social Work Department BRIEF PSYCHOSOCIAL ASSESSMENT 08/10/2012  Patient:  [name], [name]     Account Number:  [id number]     Admit date:  08/09/2012  Clinical Social Worker:  Margaree Mackintosh  Date/Time:  08/10/2012 12:00 M  Referred by:  CSW  Date Referred:  08/10/2012 Referred for  Transportation assistance   Other Referral:   Interview type:  Other - See comment Other interview type:   [facility] NP Marylyn.    PSYCHOSOCIAL DATA Living Status:  FAMILY Admitted from facility:   Level of care:   Primary support name:  Ellery Plunk: [phone] Primary support relationship to patient:  FRIEND Degree of support available:   Unknown.    CURRENT CONCERNS Current Concerns  Other - See comment   Other Concerns:   Transportation.    SOCIAL WORK ASSESSMENT / Clinical biochemist met with pt's [facility] NP-Marylyn.  Per Marylyn, [facility] is able to provide transportation for pt home once medically ready for dc.  NP requests notification the day of dc to assist with transportation home.  NP reports pt resides at home wiht her mother.  CSW staffed case with RN. CSW to continue to follow and assist as needed.   Assessment/plan status:  Information/Referral to [facility] Other assessment/ plan:   Information/referral to community resources:   [facility].    PATIENTS/FAMILYS RESPONSE TO PLAN OF CARE: NP thanked CSW for intervention.

## 2012-08-10 NOTE — Progress Notes (Signed)
Witnessed apneas while asleep on the chair with severe desatn to 76% & rapid recovery, snoring+ Will defer discharge x 1 day, trial CPAP overnight & during sleep & arrange for outpt PSG   Dhanvin Szeto V.

## 2012-08-10 NOTE — Progress Notes (Signed)
eLink Physician-Brief Progress Note Patient Name: [name] DOB: [birth year] MRN: [id number]  Date of Service  08/10/2012   HPI/Events of Note   2 x repeated hypoglycemia  eICU Interventions  Change d5 to d10 kvo    Intervention Category Intermediate Interventions: Other:  [name] 08/10/2012, 12:13 AM

## 2012-08-10 NOTE — Progress Notes (Signed)
PULMONARY  / CRITICAL CARE MEDICINE  Name: [name] MRN: [id number] DOB: 03/15/1946    ADMISSION DATE:  08/09/2012 CONSULTATION DATE:  08/09/12  REFERRING MD :  EDP  CHIEF COMPLAINT:  Respiratory failure.  BRIEF PATIENT DESCRIPTION: 67 year old ESRD-HD who missed dialysis on day of admission, called EMS for N/V.  In the ED the patient was very lethargic, suffered two episodes of respiratory arrest and bradycardia that seemed to respond to narcan.  The patient reports she took Vicodin on the day prior to admission.  She is intermittently lethargic and unable to provide further history.  SIGNIFICANT EVENTS / STUDIES:  3/17>>>Respiratory arrest responding to narcan.  LINES / TUBES: PIV  CULTURES: Blood 3/17>>>ng Urine 3/17>>> Sputum 3/17>>>  ANTIBIOTICS: None  SUBJECTIVE: oob to chair Denies pain, nausea  VITAL SIGNS: Temp:  [97.7 F (36.5 C)-98.5 F (36.9 C)] 98.3 F (36.8 C) (03/18 0747) Pulse Rate:  [50-78] 67 (03/18 0700) Resp:  [8-18] 8 (03/18 0700) BP: (88-222)/(37-104) 129/67 mmHg (03/18 0700) SpO2:  [92 %-100 %] 95 % (03/18 0700) Weight:  [72.6 kg (160 lb 0.9 oz)-75.1 kg (165 lb 9.1 oz)] 72.6 kg (160 lb 0.9 oz) (03/18 0500) HEMODYNAMICS:   VENTILATOR SETTINGS:   INTAKE / OUTPUT: Intake/Output     03/17 0701 - 03/18 0700 03/18 0701 - 03/19 0700   I.V. (mL/kg) 379.1 (5.2) 40 (0.6)   Total Intake(mL/kg) 379.1 (5.2) 40 (0.6)   Other 1500    Total Output 1500     Net -1120.9 +40         PHYSICAL EXAMINATION: General:  Chronically ill appearing female Neuro:  Alert, interactive, moving all ext to command. HEENT:  [place]/AT, PERRL, EOM-I, supple neck, +JVD and -LAN. Cardiovascular:  RRR, Nl S1/S2, -M/R/G. Lungs:  CTA bilaterally but decreased BS and shallow. Abdomen:  Soft, NT, ND and +BS. Musculoskeletal:  -edema and -tenderness.  L BKA. Skin:  Thin but intact.  LABS:  Recent Labs Lab 08/09/12 0824 08/09/12 0852 08/09/12 0905 08/09/12 0914  08/09/12 0932 08/09/12 1025 08/09/12 1230 08/09/12 1300 08/10/12 0250  HGB 12.9  --   --   --  13.6  --  11.3*  --  10.9*  WBC 9.0  --   --   --   --   --  6.6  --  7.4  PLT 193  --   --   --   --   --  179  --  165  NA 139  --   --   --  139  --   --  139 136  K 6.3*  --   --   --  6.4*  --   --  7.4* 4.3  CL 98  --   --   --  108  --   --  101 96  CO2 21  --   --   --   --   --   --  25 29  GLUCOSE 164*  --   --   --  147*  --   --  87 82  BUN 40*  --   --   --  45*  --   --  44* 16  CREATININE 6.60*  --   --   --  5.70*  --   --  6.77* 3.73*  CALCIUM 9.4  --   --   --   --   --   --  8.9 8.9  MG  --   --  3.0*  --   --   --   --   --  2.2  PHOS  --   --  8.5*  --   --   --   --   --  4.5  AST 28  --   --   --   --   --   --   --   --   ALT 18  --   --   --   --   --   --   --   --   ALKPHOS 158*  --   --   --   --   --   --   --   --   BILITOT 0.2*  --   --   --   --   --   --   --   --   PROT 7.8  --   --   --   --   --   --   --   --   ALBUMIN 3.9  --   --   --   --   --   --   --   --   APTT  --   --   --   --   --   --  32  --   --   INR  --   --   --   --   --   --  1.05  --   --   LATICACIDVEN  --   --   --  5.9*  --   --   --   --   --   TROPONINI  --  <0.30  --   --   --   --   --   --   --   PROCALCITON  --   --   --   --   --  0.24  --   --   --   PROBNP  --   --   --   --   --  15853.0*  --   --   --     Recent Labs Lab 08/09/12 2039 08/09/12 2349 08/10/12 0417 08/10/12 0648 08/10/12 0744  GLUCAP 110* 69* 78 82 82    CXR: Cardiomegally with small left sided pleural effusion.  ASSESSMENT / PLAN:  PULMONARY A: Respiratory failure in the settings of narcotic use. P:   - Off Narcan drip.  CARDIOVASCULAR A: Normotensive and RRR at this time.  ST segment peaked due to hyperkalemia. P:  -resolved - Dialysis.  RENAL A:  ESRD-HD, hyperkalemia. P:   - HD per renal.  GASTROINTESTINAL A:  N/V, ?narcotic related vs metabolic, no evidence of infection  at this time. P:   - Colace for diarrhea. - Monitor.  HEMATOLOGIC A:  No acute abnormalities.  Patient is Jehova's witness. P:  - No transfusion. - Monitor CBC.  INFECTIOUS A:  No signs of active infection. P:   - Pan culture but no abx for now.  ENDOCRINE A:  Hypothyroid and DM.   P:   - ISS. - po synthroid  NEUROLOGIC A:  Opioid overdose, accidental P:   -resolved -on xanax klonopin and oxycodone with prn atarax and phenergan - Patient on beta blockers, narcotics and xanax, will hold all for now  - I spoke to NP from [facility] who will make dc arrangements - polypharmacy was noted & they will provide all her Rx Discharge either today  or tomorrow once arrangements made   [facility] Iv V. [phone] Pulmonary and Critical Care Medicine [facility] Pager: [phone]  08/10/2012, 11:10 AM

## 2012-08-10 NOTE — Progress Notes (Signed)
08/10/2012 patient transfer from 2100 at 1830. She is alert, oriented and have a old left bka. Patient have bruise on the left ac where iv site is, feet is dry.  Patient have a right upper arm fistula and it is positve.She was placed on telemetry and running sinus rhythm. [facility] RN.

## 2012-08-10 NOTE — Discharge Summary (Signed)
Discharge deferred due to witnessed apneas 7 desaturations while asleep in a chair CPAP trial tonight - dc home with CPAP & oupt PSG (arranged for 4/14 )  Harbour Nordmeyer V.

## 2012-08-11 LAB — CBC WITH DIFFERENTIAL/PLATELET
Basophils Relative: 0 % (ref 0–1)
Eosinophils Absolute: 0.1 10*3/uL (ref 0.0–0.7)
MCH: 32.2 pg (ref 26.0–34.0)
MCHC: 32.7 g/dL (ref 30.0–36.0)
Neutrophils Relative %: 59 % (ref 43–77)
Platelets: 149 10*3/uL — ABNORMAL LOW (ref 150–400)

## 2012-08-11 LAB — RENAL FUNCTION PANEL
Albumin: 3.2 g/dL — ABNORMAL LOW (ref 3.5–5.2)
BUN: 33 mg/dL — ABNORMAL HIGH (ref 6–23)
Phosphorus: 5.1 mg/dL — ABNORMAL HIGH (ref 2.3–4.6)
Potassium: 4.2 mEq/L (ref 3.5–5.1)
Sodium: 135 mEq/L (ref 135–145)

## 2012-08-11 LAB — GLUCOSE, CAPILLARY
Glucose-Capillary: 68 mg/dL — ABNORMAL LOW (ref 70–99)
Glucose-Capillary: 106 mg/dL — ABNORMAL HIGH (ref 70–99)

## 2012-08-11 MED ORDER — SODIUM CHLORIDE 0.9 % IV SOLN: 100.0000 mL | INTRAVENOUS | Status: DC | PRN

## 2012-08-11 MED ORDER — DOXERCALCIFEROL 4 MCG/2ML IV SOLN
INTRAVENOUS | Status: AC
  Administered 2012-08-11: 1 ug via INTRAVENOUS
  Filled 2012-08-11: qty 2

## 2012-08-11 MED ORDER — LIDOCAINE HCL (PF) 1 % IJ SOLN: 5.0000 mL | INTRAMUSCULAR | Status: DC | PRN

## 2012-08-11 MED ORDER — ALTEPLASE 2 MG IJ SOLR: 2.0000 mg | Freq: Once | INTRAMUSCULAR | Status: DC | PRN

## 2012-08-11 MED ORDER — CALCIUM CARBONATE ANTACID 500 MG PO CHEW
200.0000 mg | CHEWABLE_TABLET | ORAL | Status: DC | PRN
  Administered 2012-08-11: 200 mg via ORAL
  Filled 2012-08-11 (×2): qty 1

## 2012-08-11 MED ORDER — HEPARIN SODIUM (PORCINE) 1000 UNIT/ML DIALYSIS: 20.0000 [IU]/kg | INTRAMUSCULAR | Status: DC | PRN

## 2012-08-11 MED ORDER — LIDOCAINE-PRILOCAINE 2.5-2.5 % EX CREA: 1.0000 "application " | TOPICAL_CREAM | CUTANEOUS | Status: DC | PRN

## 2012-08-11 MED ORDER — PENTAFLUOROPROP-TETRAFLUOROETH EX AERO: 1.0000 "application " | INHALATION_SPRAY | CUTANEOUS | Status: DC | PRN

## 2012-08-11 MED ORDER — NEPRO/CARBSTEADY PO LIQD: 237.0000 mL | ORAL | Status: DC | PRN

## 2012-08-11 MED ORDER — HEPARIN SODIUM (PORCINE) 1000 UNIT/ML DIALYSIS: 1000.0000 [IU] | INTRAMUSCULAR | Status: DC | PRN

## 2012-08-11 NOTE — Progress Notes (Signed)
Spoke with patient regarding what brought her into the hospital. She informed me that she had taken one of her mother's pain medications when hers ran out. She figured it would be "no big deal". Pt reports she will never take "even so much as another person's aspirin again". We spoke and I  Reinforced the importance and possible adverse reactions that could result from doing so. [name] agrees and understands. [name]

## 2012-08-11 NOTE — Progress Notes (Signed)
Pt declines CPAP per respiratory . Sats maintained 98% on 2 l/m while sleeping. Titrated down to 1 l/m via n/c  sats = 94%. No signs of labored or uncomfortable breathing. Will continue to monitor. [name]

## 2012-08-11 NOTE — Procedures (Signed)
Assessment/Plan:  1. Respiratory failure/hypothermia - presumed secondary to narcotics - resolved with narcan. 2. ESRD - MWF - HD Wednesday planned 3 Hyperkalemia, resolved  4 Polypharmacy -    Sats down to 88% on 0xygen and after awakening goes up to 100%.  Stable hemodynamics on dialysis.  Possible discharge today.  We will notify OP HD unit.  Raelea Gosse C

## 2012-08-11 NOTE — Progress Notes (Signed)
RT placed patient on CPAP set in auto titration mode with a minimum pressure of 5 and a maximum pressure of 15 with a 3L Oxygen bleed in.  Patient has never worn CPAP at home before so RT set her up with a nasal mask.  Patient seems to be tolerating well at this time.  Sats are 100% HR 66 RR 18.  RT made patient aware that if she needed assistance throughout the night that she could notify her RN and RT would come help.

## 2012-08-12 LAB — GLUCOSE, CAPILLARY
Glucose-Capillary: 104 mg/dL — ABNORMAL HIGH (ref 70–99)
Glucose-Capillary: 137 mg/dL — ABNORMAL HIGH (ref 70–99)

## 2012-08-12 NOTE — Progress Notes (Signed)
Patient discharged to home. Patient AVS reviewed. Patient made aware that home health services would provide her with a CPAP machine, per M.D.'s order. Patient verbalized understanding of importance of CPAP machine usage and understanding of medications and follow-up appointments.  Patient remains stable; no signs or symptoms of distress.  Patient educated to return to the ER in cases of SOB, dizziness, fever, chest pain, or fainting. Patient transported home with belongings via [facility].

## 2012-08-12 NOTE — Progress Notes (Signed)
PULMONARY  / CRITICAL CARE MEDICINE  Name: [name] MRN: [id number] DOB: December 12, 1945    ADMISSION DATE:  08/09/2012 CONSULTATION DATE:  08/09/12  REFERRING MD :  EDP  CHIEF COMPLAINT:  Respiratory failure.  BRIEF PATIENT DESCRIPTION: 67 year old ESRD-HD who missed dialysis on day of admission, called EMS for N/V.  In the ED the patient was very lethargic, suffered two episodes of respiratory arrest and bradycardia that seemed to respond to narcan.  The patient reports she took Vicodin on the day prior to admission.  She is intermittently lethargic and unable to provide further history.  SIGNIFICANT EVENTS / STUDIES:  3/17>>>Respiratory arrest responding to narcan.  LINES / TUBES: PIV  CULTURES: Blood 3/17>>>ng Urine 3/17>>> Sputum 3/17>>>  ANTIBIOTICS: None    VITAL SIGNS: Temp:  [97.7 F (36.5 C)-99 F (37.2 C)] 98.4 F (36.9 C) (03/20 0916) Pulse Rate:  [56-68] 68 (03/20 0916) Resp:  [9-17] 16 (03/20 0916) BP: (77-150)/(42-86) 136/70 mmHg (03/20 0916) SpO2:  [92 %-100 %] 99 % (03/20 0916) Weight:  [72.4 kg (159 lb 9.8 oz)] 72.4 kg (159 lb 9.8 oz) (03/19 2046) HEMODYNAMICS:   VENTILATOR SETTINGS:   INTAKE / OUTPUT: Intake/Output     03/19 0701 - 03/20 0700 03/20 0701 - 03/21 0700   P.O. 840 240   I.V. (mL/kg)     Total Intake(mL/kg) 840 (11.6) 240 (3.3)   Other 1476    Total Output 1476     Net -636 +240         PHYSICAL EXAMINATION: General:  Chronically ill appearing female. On O2 at 1 l/m. Wore cpap during the night. Neuro:  Alert, interactive, moving all ext to command. HEENT:  [place]/AT, PERRL, EOM-I, supple neck, +JVD and -LAN. Cardiovascular:  RRR, Nl S1/S2, -M/R/G. Lungs:  CTA bilaterally but decreased BS and shallow. Abdomen:  Soft, NT, ND and +BS. Musculoskeletal:  -edema and -tenderness.  L BKA. Skin:  Thin but intact.  LABS:  Recent Labs Lab 08/09/12 0824 08/09/12 0852 08/09/12 0905 08/09/12 0914  08/09/12 1025 08/09/12 1230  08/09/12 1300 08/10/12 0250 08/11/12 0808  HGB 12.9  --   --   --   < >  --  11.3*  --  10.9* 10.8*  WBC 9.0  --   --   --   --   --  6.6  --  7.4 5.5  PLT 193  --   --   --   --   --  179  --  165 149*  NA 139  --   --   --   < >  --   --  139 136 135  K 6.3*  --   --   --   < >  --   --  7.4* 4.3 4.2  CL 98  --   --   --   < >  --   --  101 96 95*  CO2 21  --   --   --   --   --   --  25 29 29   GLUCOSE 164*  --   --   --   < >  --   --  87 82 87  BUN 40*  --   --   --   < >  --   --  44* 16 33*  CREATININE 6.60*  --   --   --   < >  --   --  6.77*  3.73* 6.20*  CALCIUM 9.4  --   --   --   --   --   --  8.9 8.9 9.0  MG  --   --  3.0*  --   --   --   --   --  2.2  --   PHOS  --   --  8.5*  --   --   --   --   --  4.5 5.1*  AST 28  --   --   --   --   --   --   --   --   --   ALT 18  --   --   --   --   --   --   --   --   --   ALKPHOS 158*  --   --   --   --   --   --   --   --   --   BILITOT 0.2*  --   --   --   --   --   --   --   --   --   PROT 7.8  --   --   --   --   --   --   --   --   --   ALBUMIN 3.9  --   --   --   --   --   --   --   --  3.2*  APTT  --   --   --   --   --   --  32  --   --   --   INR  --   --   --   --   --   --  1.05  --   --   --   LATICACIDVEN  --   --   --  5.9*  --   --   --   --   --   --   TROPONINI  --  <0.30  --   --   --   --   --   --   --   --   PROCALCITON  --   --   --   --   --  0.24  --   --   --   --   PROBNP  --   --   --   --   --  15853.0*  --   --   --   --   < > = values in this interval not displayed.  Recent Labs Lab 08/11/12 1701 08/11/12 2042 08/12/12 0009 08/12/12 0417 08/12/12 0719  GLUCAP 90 106* 137* 104* 117*    Imaging: No results found.   ASSESSMENT / PLAN:  PULMONARY A: Respiratory failure in the settings of narcotic use. P:   - Off Narcan drip. -3-18 noted to desat off O2 during sleep and requires Cpap for nocturnal desaturation.   CARDIOVASCULAR A: Normotensive and RRR at this time.  ST segment peaked  due to hyperkalemia. P:  -resolved - Dialysis.  RENAL A:  ESRD-HD, hyperkalemia. P:   - HD per renal.  GASTROINTESTINAL A:  N/V, ?narcotic related vs metabolic, no evidence of infection at this time. P:   - Monitor.   ENDOCRINE A:  Hypothyroid and DM.   P:   - ISS. - po synthroid  NEUROLOGIC A:  Opioid overdose, accidental P:   -resolved -on  xanax klonopin and oxycodone with prn atarax and phenergan - Patient on beta blockers, narcotics and xanax, will hold all for now  - I discussde NP from [facility] who will make dc arrangements - polypharmacy was noted & they will provide all her Rx Discharge after need for O2 and Cpap confirmed. She should be maintained on cpap until PSG - this has been scheduled.  ALVA,RAKESH V.

## 2012-08-12 NOTE — Clinical Social Work Note (Signed)
Patient is medically stable and ready for discharge 08/12/2012. CSW intern contacted Nita Sells with [facility] [phone]) to advise that patient is ready for discharge and needs assistance with transportation.   Fernande Boyden, Social Work Intern 08/13/2102  Co-sign: Genelle Bal, LCSW

## 2012-08-12 NOTE — Discharge Summary (Signed)
Physician Discharge Summary     Patient ID: [name] MRN: [id number] DOB/AGE: [birth year] 65 y.o.  Admit date: 08/09/2012 Discharge date: 08/12/2012  Admission Diagnoses: Acute respiratory arrest  Discharge Diagnoses:  Active Problems:   Acute respiratory failure   Altered mental status   Narcotic overdose   Hyperkalemia  Significant Hospital tests/ studies/ interventions and procedures    SIGNIFICANT EVENTS / STUDIES:  3/17>>>Respiratory arrest responding to narcan.   LINES / TUBES:  PIV   CULTURES:  Blood 3/17>>>ng  Urine 3/17>>> neg  Sputum 3/17>>> neg   ANTIBIOTICS:  None  BRIEF PATIENT DESCRIPTION:  67 year old ESRD-HD who missed dialysis on day of admission, called EMS for N/V. In the ED the patient was very lethargic, suffered two episodes of respiratory arrest and bradycardia that seemed to respond to narcan. The patient reports she took Vicodin on the day prior to admission. She is intermittently lethargic and unable to provide further history. Hospital Course:  Respiratory failure in the settings of narcotic use.  (resolved) OSA. -treated supportively w/ narcan gtt and CPAP. Now resolved, but still has episodes of hypoxia. She has PSG arranged. She will go home with CPAP.   ESRD-HD, hyperkalemia.  See above. Missed a session of HD. Now ready to go home. Will follow up with routine HD center    Hypothyroid and DM.   - ISS.  - po synthroid   Opioid overdose, accidental  P:  -resolved  -on xanax klonopin and oxycodone with prn atarax and phenergan   - Patient on beta blockers, narcotics and xanax, will hold all for now - I discussde NP from [facility] who will make dc arrangements - polypharmacy was noted & they will provide all her Rx    Discharge Exam: BP 144/79  Pulse 67  Temp(Src) 98.1 F (36.7 C) (Oral)  Resp 18  Ht 5\' 8"  (1.727 m)  Wt 72.4 kg (159 lb 9.8 oz)  BMI 24.27 kg/m2  SpO2 99% Room air  PHYSICAL EXAMINATION:  General: Chronically  ill appearing female. On O2 at 1 l/m. Wore cpap during the night.  Neuro: Alert, interactive, moving all ext to command.  HEENT: [place]/AT, PERRL, EOM-I, supple neck, +JVD and -LAN.  Cardiovascular: RRR, Nl S1/S2, -M/R/G.  Lungs: CTA bilaterally but decreased BS and shallow.  Abdomen: Soft, NT, ND and +BS.  Musculoskeletal: -edema and -tenderness. L BKA.  Skin: Thin but intact.  Labs at discharge Lab Results  Component Value Date   CREATININE 6.20* 08/11/2012   BUN 33* 08/11/2012   NA 135 08/11/2012   K 4.2 08/11/2012   CL 95* 08/11/2012   CO2 29 08/11/2012   Lab Results  Component Value Date   WBC 5.5 08/11/2012   HGB 10.8* 08/11/2012   HCT 33.0* 08/11/2012   MCV 98.5 08/11/2012   PLT 149* 08/11/2012   Lab Results  Component Value Date   ALT 18 08/09/2012   AST 28 08/09/2012   ALKPHOS 158* 08/09/2012   BILITOT 0.2* 08/09/2012   Lab Results  Component Value Date   INR 1.05 08/09/2012   INR 0.98 02/11/2010   INR 1.07 11/13/2009    Current radiology studies No results found.  Disposition:  01-Home or Self Care      Discharge Orders   Future Appointments Provider Department Dept Phone   09/06/2012 8:00 PM [facility] Room 1 [facility] [phone]   10/21/2012 2:00 PM [facility] Lab 3 [facility]  [phone]   10/21/2012 2:30 PM [facility]Lab Lab 3 [facility] [phone]   10/21/2012 3:00 PM Evern Bio, NP [facility] [phone]   Future Orders Complete By Expires     Call MD for:  difficulty breathing, headache or visual disturbances  As directed     Call MD for:  persistant dizziness or light-headedness  As directed     Call MD for:  severe uncontrolled pain  As directed     Call MD for:  temperature >100.4  As directed     Diet - low sodium heart healthy  As directed     Discharge diet:  As directed     Scheduling Instructions:      Diabetic, Renal Diet /  Carbohydrate Modified.  No concentrated sweets.    Discharge instructions  As directed     Comments:      Do not share medications with others.  Only take what is prescribed for you. Follow up with HD as previously scheduled.    Discharge patient  As directed     Comments:      Dc after [facility] informed of dc.    Increase activity slowly  As directed     Increase activity slowly  As directed         Medication List    TAKE these medications       ALPRAZolam 0.25 MG tablet  Commonly known as:  XANAX  Take 0.25 mg by mouth 3 (three) times daily as needed. As needed for anxiety.     aspirin 81 MG tablet  Take 81 mg by mouth daily.     cetirizine 10 MG tablet  Commonly known as:  ZYRTEC  Take 10 mg by mouth daily.     clonazePAM 1 MG tablet  Commonly known as:  KLONOPIN  Take 1 mg by mouth 2 (two) times daily.     diphenhydrAMINE 25 mg capsule  Commonly known as:  BENADRYL  Take 25 mg by mouth every 6 (six) hours as needed. As needed for allergies.     docusate sodium 100 MG capsule  Commonly known as:  COLACE  Take 100 mg by mouth daily.     folic acid-vitamin b complex-vitamin c-selenium-zinc 3 MG Tabs  Take 1 tablet by mouth daily.     hydrOXYzine 25 MG tablet  Commonly known as:  ATARAX/VISTARIL  Take 25 mg by mouth every 6 (six) hours as needed. As needed for itching.     levothyroxine 112 MCG tablet  Commonly known as:  SYNTHROID, LEVOTHROID  Take 112 mcg by mouth daily.     loperamide 2 MG capsule  Commonly known as:  IMODIUM  Take 2 mg by mouth 4 (four) times daily as needed. As needed for loose stools.     lovastatin 10 MG tablet  Commonly known as:  MEVACOR  Take 10 mg by mouth at bedtime.     metoprolol succinate 25 MG 24 hr tablet  Commonly known as:  TOPROL-XL  Take 25 mg by mouth daily.     oxyCODONE-acetaminophen 5-325 MG per tablet  Commonly known as:  PERCOCET/ROXICET  Take 1 tablet by mouth every 6 (six) hours as needed. As needed for pain.       promethazine 25 MG tablet  Commonly known as:  PHENERGAN  Take 25 mg by mouth every 8 (eight) hours as needed.       Follow-up Information   Follow  up with Thane Edu, MD On 08/17/2012. (Office will call you.  )    Contact information:   [address] [place] [address] [phone]       Follow up with sleep lab On 09/06/2012. (8 pm)       Discharged Condition: fair  Physician Statement:   The Patient was personally examined, the discharge assessment and plan has been personally reviewed and I agree with ACNP Babcock's assessment and plan. > 30 minutes of time have been dedicated to discharge assessment, planning and discharge instructions.   Signed: BABCOCK,PETE 08/12/2012, 3:43 PM   Derya Dettmann V.

## 2012-08-12 NOTE — Progress Notes (Signed)
Subjective:  Used CPAP last night tolerated, no cos today ready to go home Objective Vital signs in last 24 hours: Filed Vitals:   08/11/12 2046 08/11/12 2310 08/12/12 0413 08/12/12 0916  BP: 127/82  108/68 136/70  Pulse: 67 66 67 68  Temp: 99 F (37.2 C)  98.1 F (36.7 C) 98.4 F (36.9 C)  TempSrc: Oral  Oral Oral  Resp: 12 16 16 16   Height:      Weight: 72.4 kg (159 lb 9.8 oz)     SpO2: 100% 100% 97% 99%   Weight change:   Intake/Output Summary (Last 24 hours) at 08/12/12 1002 Last data filed at 08/12/12 0915  Gross per 24 hour  Intake   1080 ml  Output   1476 ml  Net   -396 ml   Labs: Basic Metabolic Panel:  Recent Labs Lab 08/09/12 0905  08/09/12 1300 08/10/12 0250 08/11/12 0808  NA  --   < > 139 136 135  K  --   < > 7.4* 4.3 4.2  CL  --   < > 101 96 95*  CO2  --   --  25 29 29   GLUCOSE  --   < > 87 82 87  BUN  --   < > 44* 16 33*  CREATININE  --   < > 6.77* 3.73* 6.20*  CALCIUM  --   --  8.9 8.9 9.0  PHOS 8.5*  --   --  4.5 5.1*  < > = values in this interval not displayed. Liver Function Tests:  Recent Labs Lab 08/09/12 0824 08/11/12 0808  AST 28  --   ALT 18  --   ALKPHOS 158*  --   BILITOT 0.2*  --   PROT 7.8  --   ALBUMIN 3.9 3.2*    Recent Labs Lab 08/09/12 0824 08/09/12 0905  LIPASE 25  --   AMYLASE  --  275*   No results found for this basename: AMMONIA,  in the last 168 hours CBC:  Recent Labs Lab 08/09/12 0824  08/09/12 1230 08/10/12 0250 08/11/12 0808  WBC 9.0  --  6.6 7.4 5.5  NEUTROABS 8.2*  --   --   --  3.3  HGB 12.9  < > 11.3* 10.9* 10.8*  HCT 40.3  < > 33.6* 33.7* 33.0*  MCV 102.3*  --  96.6 99.4 98.5  PLT 193  --  179 165 149*  < > = values in this interval not displayed. Cardiac Enzymes:  Recent Labs Lab 08/09/12 0852  TROPONINI <0.30   CBG:  Recent Labs Lab 08/11/12 1701 08/11/12 2042 08/12/12 0009 08/12/12 0417 08/12/12 0719  GLUCAP 90 106* 137* 104* 117*    Iron Studies: No results found for  this basename: IRON, TIBC, TRANSFERRIN, FERRITIN,  in the last 72 hours Studies/Results: No results found. Medications: . dextrose 1,000 mL (08/10/12 0650)   . antiseptic oral rinse  15 mL Mouth Rinse BID  . aspirin EC  81 mg Oral Daily  . clonazePAM  1 mg Oral BID  . docusate sodium  100 mg Oral BID  . doxercalciferol  1 mcg Intravenous Q M,W,F-HD  . heparin  5,000 Units Subcutaneous Q8H  . insulin aspart  2-6 Units Subcutaneous Q4H  . levothyroxine  112 mcg Oral QAC breakfast  . metoprolol succinate  25 mg Oral Daily   I  have reviewed scheduled and prn medications.  Physical Exam: General: alert NAD Heart: RRR Lungs: CTA  Abdomen:  Soft, nintender Extremities: Dialysis Access: no pedal edema/ L  BKA/ Pos. Bruit R U A AVF   Dialysis Orders: Center: MWF AF 3.75 hours, 400/A 1.5 Optiflux 180 right upper AVF std heparin Venofer 50/week, Epo 1600 TIW, hectorol 1 TIW Venofer 50/week  ewd =74.5 kg   Assessment/Plan:  1. Respiratory failure/hypothermia - presumed secondary to narcotics -  With element of OSA 2. OSA = CPAP arrangements per Pulm.. 3. ESRD - MWF - HD on schedule, k=4.2 yesterday pre hd 4. Hyperkalemia - resolved with  HD FU out pt.labs for appropriate k bath on hd 5. Hypertension/volume -136/70 am bp  volume control with hd now lower edw  To 72.5 kga nd low dose Metoprolol 25mg  hs 6. Anemia - Hgb 10.8  - no ESA or venofer / continue as out pt. Same prior dosing 7. Metabolic bone disease - iPTH volatile; hectorol 1 mcg /cda and phos stab 8. Polypharmacy - prior on xanax klonopin and oxycodone with prn atarax and phenergan/ now on Clonazepam 1mg  bid 10. CAD hx MI and stents;- prox and mid RCA 2007 - Dr. Elsie Lincoln on statin-     Lenny Pastel, PA-C [facility] Beeper [phone] 08/12/2012,10:02 AM  LOS: 3 days   As above. Stable.  Now on CPAP with plans for OP sleep study.  Next HD MWF. Glennis Montenegro C

## 2012-08-15 LAB — CULTURE, BLOOD (ROUTINE X 2)

## 2012-09-06 ENCOUNTER — Ambulatory Visit (HOSPITAL_BASED_OUTPATIENT_CLINIC_OR_DEPARTMENT_OTHER): Payer: Medicare Other

## 2012-09-08 ENCOUNTER — Ambulatory Visit (HOSPITAL_BASED_OUTPATIENT_CLINIC_OR_DEPARTMENT_OTHER): Payer: Medicare (Managed Care) | Attending: Pulmonary Disease

## 2012-09-08 VITALS — Ht 68.0 in | Wt 164.0 lb

## 2012-09-08 DIAGNOSIS — G4733 Obstructive sleep apnea (adult) (pediatric): Secondary | ICD-10-CM

## 2012-09-14 ENCOUNTER — Telehealth: Payer: Self-pay | Admitting: Pulmonary Disease

## 2012-09-14 NOTE — Telephone Encounter (Signed)
Pt had split night done 09/08/12 The ordering provider, Dr Dorothe Pea is calling for results Please advise when read thanks

## 2012-09-14 NOTE — Telephone Encounter (Signed)
Pl ask sleep lab to fax over

## 2012-09-15 DIAGNOSIS — G4730 Sleep apnea, unspecified: Secondary | ICD-10-CM

## 2012-09-15 DIAGNOSIS — G4710 Hypersomnia, unspecified: Secondary | ICD-10-CM

## 2012-09-15 NOTE — Procedures (Signed)
[name], [name]                ACCOUNT NO.:  [id number]  MEDICAL RECORD NO.:  [id number]          PATIENT TYPE:  OUT  LOCATION:  SLEEP CENTER                 FACILITY:  [facility]  PHYSICIAN:  Oretha Milch, MD      DATE OF BIRTH:  [birth year]  DATE OF STUDY:  09/08/2012                           NOCTURNAL POLYSOMNOGRAM  REFERRING PHYSICIAN:  Jethro Bastos, M.D.  INDICATION FOR STUDY:  [name] is a 67 year old woman with end-stage renal disease on hemodialysis, who had a hospitalization on August 09, 2012 for an accidental overdose of opiates.  During this ICU admission, she was noted to have witnessed apneas with desaturations while asleep in a chair.  At the time of this study, she weighed 164 pounds with a height of 5 feet 8 inches, BMI of 25, neck size of 14 inches.  EPWORTH SLEEPINESS SCORE:  8.  This intervention polysomnogram was performed with sleep technologist in attendance.  EEG, EOG, EMG, EKG, and respiratory parameters were recorded.  Sleep stages, arousals, limb movements, and respiratory data were scored according to criteria laid out by the American Academy of Sleep Medicine.  SLEEP ARCHITECTURE:  Lights out was at 9:44 p.m., lights on was at 4:55 a.m.  CPAP was initiated at 52 minutes past midnight.  During the diagnostic portion, total sleep time was 125 minutes with a sleep period time of 127 minutes with a sleep efficiency of 67%.  Sleep latency was 16 minutes.  Latency to REM sleep was 39 minutes.  Sleep stages as a percentage of total sleep time was N1 4%, N2 42%, N3 1.6%, REM sleep 52% (65 minutes).  Supine sleep was not noted.  During the titration portion, REM sleep accounted for 94 minutes and supine sleep was noted for 83 minutes.  RESPIRATORY DATA:  During the diagnostic portion, there were 0 obstructive apneas, 0 central apneas, 0 mixed apneas, and 50 hypopneas with an apnea-hypopnea index of 24 events per hour.  Due to this degree of  respiratory disturbance, CPAP was initiated at 4 cm and titrated to a final level of 7 cm with a medium nasal mask.  At this final level for 37 minutes including 34 minutes of REM sleep, 1 central apnea was noted with a low desaturation of 92%.  This appears to be optimal level used during the study.  AROUSAL DATA:  During the diagnostic portion, the arousal index was 6 events per hour, and during the titration portion this was 5 events per hour.  LIMB MOVEMENT DATA:  During diagnostic portion 72 PLMS were noted with an index of 34 events per hour.  PLM arousal index was 1.9 events per hour.  These seemed to disappear with CPAP.  OXYGEN SATURATION DATA:  The desaturation index was 24 events per hour. During the titration portion, she spent 1 minute with a saturation less than 88%.  CARDIAC DATA:  The low heart rate was 49 beats per minute.  The high heart rate recorded was an artifact.  No arrhythmias were noted.  DISCUSSION:  She was desensitized with a small to medium nasal mask. Pressures were increased for respiratory events, snoring and arousal. Supine and  REM sleep was observed on final level of the CPAP.  Titration seems to be optimal.  IMPRESSION: 1. Moderate obstructive sleep apnea with predominant hypopneas causing     sleep fragmentation and moderate oxygen desaturation. 2. This was corrected by CPAP of 7 cm with a medium nasal mask.     Titration was optimal. 3. No evidence of cardiac arrhythmias or behavioral disturbance during     sleep.  PLMs were observed but seemed to disappear with CPAP.  RECOMMENDATION: 1. CPAP can be initiated at 7 cm with a medium nasal mask.  Compliance     can be monitored at this level. 2. She should be asked to avoid medication sedative side effects. 3. She should be cautioned against driving when sleepy.     Oretha Milch, MD    RVA/MEDQ  D:  09/15/2012 08:02:10  T:  09/15/2012 08:49:59  Job:  [id number]

## 2012-09-15 NOTE — Telephone Encounter (Signed)
I spoke with Aurther Loft at the sleep lab and was advised that RA has already read the study as of this am RA, please advise if there is anything further triage can do, thanks

## 2012-09-15 NOTE — Telephone Encounter (Signed)
lmomtcb x1 for pt  lmtcb x1 for [name]

## 2012-09-15 NOTE — Telephone Encounter (Signed)
Pl let dr Dorothe Pea know -  PSG showed moderate OSA we will take care of CPAP rx Start CPAP 7cm, med nasal mask, humidity, download in 4 wks with FU appt Pl let pt know

## 2012-09-16 ENCOUNTER — Telehealth: Payer: Self-pay | Admitting: Pulmonary Disease

## 2012-09-16 NOTE — Telephone Encounter (Signed)
Patient return call from telephone msg on 09/14/12 to receive sleep study results. Patient is aware of results and is aware CPAP has been ordered. Attempted to schedule patient to come in 4 week as requested by Dr. Vassie Loll Patient stated she would call back when she received her CPAP because it may take a little while for her to actually get the machine. Will await patients return call

## 2012-09-16 NOTE — Telephone Encounter (Signed)
Spoke with [name] she stated she has received download and THEY have placed the order for patient CPAP She states she receives download, it is discussed and then ordered.  ATC patient to inform her of results as well and set up f/u no answer,lmomtcb  Will forward to Dr. Vassie Loll as Lorain Childes of the above.

## 2012-10-21 ENCOUNTER — Ambulatory Visit: Payer: Medicare (Managed Care) | Admitting: Neurosurgery

## 2012-11-04 ENCOUNTER — Ambulatory Visit (INDEPENDENT_AMBULATORY_CARE_PROVIDER_SITE_OTHER): Payer: Medicare (Managed Care) | Admitting: Vascular Surgery

## 2012-11-04 ENCOUNTER — Encounter (INDEPENDENT_AMBULATORY_CARE_PROVIDER_SITE_OTHER): Payer: Medicare (Managed Care) | Admitting: *Deleted

## 2012-11-04 ENCOUNTER — Encounter: Payer: Self-pay | Admitting: Vascular Surgery

## 2012-11-04 DIAGNOSIS — L97511 Non-pressure chronic ulcer of other part of right foot limited to breakdown of skin: Secondary | ICD-10-CM

## 2012-11-04 DIAGNOSIS — Z48812 Encounter for surgical aftercare following surgery on the circulatory system: Secondary | ICD-10-CM

## 2012-11-04 DIAGNOSIS — I739 Peripheral vascular disease, unspecified: Secondary | ICD-10-CM

## 2012-11-04 DIAGNOSIS — I7092 Chronic total occlusion of artery of the extremities: Secondary | ICD-10-CM

## 2012-11-04 DIAGNOSIS — L97509 Non-pressure chronic ulcer of other part of unspecified foot with unspecified severity: Secondary | ICD-10-CM

## 2012-11-04 NOTE — Progress Notes (Signed)
Patient is a 67 year old female who has previously had left above-knee amputation and right femoral to below-knee popliteal bypass. Her bypass was in 2011. This was a vein graft. She has a new ulceration on the dorsum of her right foot. It is nonpainful. She thinks it is healing but wanted to check it today. She does not know how he occurred.  Chronic medical problems include hypertension, diabetes, end-stage renal disease, congestive heart failure, diabetic retinopathy, peripheral arterial disease all of which are currently stable.  Past Medical History  Diagnosis Date  . ESRD (end stage renal disease) on dialysis HD since 07/2008    [facility]; Mon, Wed, Friday  . Hypertension   . Diabetes mellitus type 2, controlled, with complications     diet controlled  . Anxiety   . Hypothyroid   . Fibromyalgia   . Chronic systolic congestive heart failure     EF 35-40% per 2D echo (03/2011)  . Diabetic retinopathy(362.0)   . PAD (peripheral artery disease)     s/p L BKA  . Major depression   . [place]   . Osteoporosis   . Peripheral neuropathy   . Carpal tunnel syndrome, bilateral   . Secondary hyperparathyroidism   . Vitamin D deficiency   . Refusal of blood transfusions as patient is Jehovah's Witness   . CAD (coronary artery disease)     s/p MI x 2, s/p stent x 4 (05/2005 with DES placed at that time)  . PONV (postoperative nausea and vomiting)   . Hyperlipidemia   . Asthma   . Migraines   . Constipation   . Hemorrhoids   . Pulmonary hypertension     PA Peak pressure 57 mmHg per 2D echo (03/2011)  . Normocytic anemia     BL Hgb 10-12  //  Chronic, likely multifactorial AOCD and possible iron deficiency component with transferrin sat < 20 historically.   Review of systems: She denies shortness of breath. She denies chest pain.  Physical exam:  Extremities: 2+ dorsalis pedis pulses 1+ popliteal pulse right leg, well-healed left above-knee amputation  Skin: 1.5 cm less than 1 mm  depth ulceration dorsum right foot directly over dorsalis pedis pulse  Data: Patient had a graft duplex scan today which showed no evidence of stenosis. She does have some increased velocity in the distal native vessel most likely due to tibial disease. This is unchanged from May of 2013. She had biphasic waveforms ABIs not checked due to vessel calcification  Assessment: Patent right femoral to below-knee popliteal bypass superficial ulceration right foot which should heal spontaneously  Plan: Followup with me in 2-3 weeks if the wound is now completely healed. Otherwise, she will stay on graft surveillance protocol.  Fabienne Bruns, MD [facility] [place] Office: [phone] Pager: [phone]

## 2012-11-25 ENCOUNTER — Ambulatory Visit
Admission: RE | Admit: 2012-11-25 | Discharge: 2012-11-25 | Disposition: A | Discharge status: Home / Self Care | Payer: No Typology Code available for payment source | Source: Ambulatory Visit | Attending: *Deleted | Admitting: *Deleted

## 2012-11-25 ENCOUNTER — Other Ambulatory Visit: Payer: Self-pay | Admitting: *Deleted

## 2012-11-25 DIAGNOSIS — R52 Pain, unspecified: Secondary | ICD-10-CM

## 2012-12-29 NOTE — Telephone Encounter (Signed)
Will close encounter as patient has not got in otuch with our office

## 2014-09-21 ENCOUNTER — Other Ambulatory Visit: Payer: Self-pay | Admitting: Family Medicine

## 2014-09-21 DIAGNOSIS — R1011 Right upper quadrant pain: Secondary | ICD-10-CM

## 2014-09-28 ENCOUNTER — Ambulatory Visit
Admission: RE | Admit: 2014-09-28 | Discharge: 2014-09-28 | Disposition: A | Discharge status: Home / Self Care | Payer: Medicare (Managed Care) | Source: Ambulatory Visit | Attending: Family Medicine | Admitting: Family Medicine

## 2014-09-28 DIAGNOSIS — R1011 Right upper quadrant pain: Secondary | ICD-10-CM

## 2014-09-28 MED ORDER — IOHEXOL 300 MG/ML  SOLN
30.0000 mL | Freq: Once | INTRAMUSCULAR | Status: AC | PRN
  Administered 2014-09-28: 30 mL via ORAL

## 2016-06-07 ENCOUNTER — Emergency Department (HOSPITAL_COMMUNITY): Payer: Medicare (Managed Care)

## 2016-06-07 ENCOUNTER — Encounter (HOSPITAL_COMMUNITY): Payer: Self-pay | Admitting: Emergency Medicine

## 2016-06-07 ENCOUNTER — Inpatient Hospital Stay (HOSPITAL_COMMUNITY)
Admission: EM | Admit: 2016-06-07 | Discharge: 2016-06-12 | DRG: 480 | Disposition: A | Discharge status: Skilled Nursing Facility | Payer: Medicare (Managed Care) | Attending: Orthopedic Surgery | Admitting: Orthopedic Surgery

## 2016-06-07 DIAGNOSIS — Z880 Allergy status to penicillin: Secondary | ICD-10-CM

## 2016-06-07 DIAGNOSIS — Z885 Allergy status to narcotic agent status: Secondary | ICD-10-CM

## 2016-06-07 DIAGNOSIS — E1142 Type 2 diabetes mellitus with diabetic polyneuropathy: Secondary | ICD-10-CM | POA: Diagnosis present

## 2016-06-07 DIAGNOSIS — Z79899 Other long term (current) drug therapy: Secondary | ICD-10-CM | POA: Diagnosis not present

## 2016-06-07 DIAGNOSIS — S728X2D Other fracture of left femur, subsequent encounter for closed fracture with routine healing: Secondary | ICD-10-CM | POA: Diagnosis not present

## 2016-06-07 DIAGNOSIS — W19XXXA Unspecified fall, initial encounter: Secondary | ICD-10-CM

## 2016-06-07 DIAGNOSIS — Z992 Dependence on renal dialysis: Secondary | ICD-10-CM | POA: Diagnosis not present

## 2016-06-07 DIAGNOSIS — Y92009 Unspecified place in unspecified non-institutional (private) residence as the place of occurrence of the external cause: Secondary | ICD-10-CM | POA: Diagnosis not present

## 2016-06-07 DIAGNOSIS — S72422A Displaced fracture of lateral condyle of left femur, initial encounter for closed fracture: Secondary | ICD-10-CM | POA: Diagnosis present

## 2016-06-07 DIAGNOSIS — M4856XA Collapsed vertebra, not elsewhere classified, lumbar region, initial encounter for fracture: Secondary | ICD-10-CM | POA: Diagnosis present

## 2016-06-07 DIAGNOSIS — Z993 Dependence on wheelchair: Secondary | ICD-10-CM

## 2016-06-07 DIAGNOSIS — E039 Hypothyroidism, unspecified: Secondary | ICD-10-CM | POA: Diagnosis present

## 2016-06-07 DIAGNOSIS — T402X5D Adverse effect of other opioids, subsequent encounter: Secondary | ICD-10-CM | POA: Diagnosis not present

## 2016-06-07 DIAGNOSIS — Z96642 Presence of left artificial hip joint: Secondary | ICD-10-CM | POA: Diagnosis present

## 2016-06-07 DIAGNOSIS — M797 Fibromyalgia: Secondary | ICD-10-CM | POA: Diagnosis present

## 2016-06-07 DIAGNOSIS — W010XXA Fall on same level from slipping, tripping and stumbling without subsequent striking against object, initial encounter: Secondary | ICD-10-CM | POA: Diagnosis present

## 2016-06-07 DIAGNOSIS — M810 Age-related osteoporosis without current pathological fracture: Secondary | ICD-10-CM | POA: Diagnosis present

## 2016-06-07 DIAGNOSIS — E1122 Type 2 diabetes mellitus with diabetic chronic kidney disease: Secondary | ICD-10-CM | POA: Diagnosis present

## 2016-06-07 DIAGNOSIS — I5022 Chronic systolic (congestive) heart failure: Secondary | ICD-10-CM | POA: Diagnosis present

## 2016-06-07 DIAGNOSIS — W050XXA Fall from non-moving wheelchair, initial encounter: Secondary | ICD-10-CM | POA: Diagnosis present

## 2016-06-07 DIAGNOSIS — I132 Hypertensive heart and chronic kidney disease with heart failure and with stage 5 chronic kidney disease, or end stage renal disease: Secondary | ICD-10-CM | POA: Diagnosis present

## 2016-06-07 DIAGNOSIS — Z807 Family history of other malignant neoplasms of lymphoid, hematopoietic and related tissues: Secondary | ICD-10-CM

## 2016-06-07 DIAGNOSIS — Z87892 Personal history of anaphylaxis: Secondary | ICD-10-CM

## 2016-06-07 DIAGNOSIS — Z419 Encounter for procedure for purposes other than remedying health state, unspecified: Secondary | ICD-10-CM

## 2016-06-07 DIAGNOSIS — Z89512 Acquired absence of left leg below knee: Secondary | ICD-10-CM | POA: Diagnosis not present

## 2016-06-07 DIAGNOSIS — S7292XA Unspecified fracture of left femur, initial encounter for closed fracture: Secondary | ICD-10-CM | POA: Diagnosis present

## 2016-06-07 DIAGNOSIS — M898X9 Other specified disorders of bone, unspecified site: Secondary | ICD-10-CM | POA: Diagnosis present

## 2016-06-07 DIAGNOSIS — M48061 Spinal stenosis, lumbar region without neurogenic claudication: Secondary | ICD-10-CM | POA: Diagnosis present

## 2016-06-07 DIAGNOSIS — Y9389 Activity, other specified: Secondary | ICD-10-CM | POA: Diagnosis not present

## 2016-06-07 DIAGNOSIS — R188 Other ascites: Secondary | ICD-10-CM | POA: Diagnosis present

## 2016-06-07 DIAGNOSIS — E785 Hyperlipidemia, unspecified: Secondary | ICD-10-CM | POA: Diagnosis present

## 2016-06-07 DIAGNOSIS — S72412A Displaced unspecified condyle fracture of lower end of left femur, initial encounter for closed fracture: Secondary | ICD-10-CM | POA: Diagnosis present

## 2016-06-07 DIAGNOSIS — S72492D Other fracture of lower end of left femur, subsequent encounter for closed fracture with routine healing: Secondary | ICD-10-CM | POA: Diagnosis not present

## 2016-06-07 DIAGNOSIS — Y92002 Bathroom of unspecified non-institutional (private) residence single-family (private) house as the place of occurrence of the external cause: Secondary | ICD-10-CM

## 2016-06-07 DIAGNOSIS — K6812 Psoas muscle abscess: Secondary | ICD-10-CM | POA: Diagnosis not present

## 2016-06-07 DIAGNOSIS — Z833 Family history of diabetes mellitus: Secondary | ICD-10-CM

## 2016-06-07 DIAGNOSIS — I252 Old myocardial infarction: Secondary | ICD-10-CM

## 2016-06-07 DIAGNOSIS — S300XXA Contusion of lower back and pelvis, initial encounter: Secondary | ICD-10-CM | POA: Diagnosis not present

## 2016-06-07 DIAGNOSIS — S32040D Wedge compression fracture of fourth lumbar vertebra, subsequent encounter for fracture with routine healing: Secondary | ICD-10-CM | POA: Diagnosis not present

## 2016-06-07 DIAGNOSIS — Z9889 Other specified postprocedural states: Secondary | ICD-10-CM | POA: Diagnosis not present

## 2016-06-07 DIAGNOSIS — F329 Major depressive disorder, single episode, unspecified: Secondary | ICD-10-CM | POA: Diagnosis present

## 2016-06-07 DIAGNOSIS — Z7982 Long term (current) use of aspirin: Secondary | ICD-10-CM | POA: Diagnosis not present

## 2016-06-07 DIAGNOSIS — W1839XA Other fall on same level, initial encounter: Secondary | ICD-10-CM | POA: Diagnosis not present

## 2016-06-07 DIAGNOSIS — S72402A Unspecified fracture of lower end of left femur, initial encounter for closed fracture: Secondary | ICD-10-CM | POA: Diagnosis not present

## 2016-06-07 DIAGNOSIS — R52 Pain, unspecified: Secondary | ICD-10-CM

## 2016-06-07 DIAGNOSIS — E1151 Type 2 diabetes mellitus with diabetic peripheral angiopathy without gangrene: Secondary | ICD-10-CM | POA: Diagnosis present

## 2016-06-07 DIAGNOSIS — Z91013 Allergy to seafood: Secondary | ICD-10-CM

## 2016-06-07 DIAGNOSIS — W1811XA Fall from or off toilet without subsequent striking against object, initial encounter: Secondary | ICD-10-CM | POA: Diagnosis not present

## 2016-06-07 DIAGNOSIS — Z531 Procedure and treatment not carried out because of patient's decision for reasons of belief and group pressure: Secondary | ICD-10-CM | POA: Diagnosis present

## 2016-06-07 DIAGNOSIS — I10 Essential (primary) hypertension: Secondary | ICD-10-CM | POA: Diagnosis present

## 2016-06-07 DIAGNOSIS — N2581 Secondary hyperparathyroidism of renal origin: Secondary | ICD-10-CM | POA: Diagnosis present

## 2016-06-07 DIAGNOSIS — I2510 Atherosclerotic heart disease of native coronary artery without angina pectoris: Secondary | ICD-10-CM | POA: Diagnosis present

## 2016-06-07 DIAGNOSIS — M7981 Nontraumatic hematoma of soft tissue: Secondary | ICD-10-CM | POA: Diagnosis not present

## 2016-06-07 DIAGNOSIS — N186 End stage renal disease: Secondary | ICD-10-CM | POA: Diagnosis present

## 2016-06-07 DIAGNOSIS — Z66 Do not resuscitate: Secondary | ICD-10-CM | POA: Diagnosis present

## 2016-06-07 DIAGNOSIS — Z91018 Allergy to other foods: Secondary | ICD-10-CM

## 2016-06-07 DIAGNOSIS — Z87891 Personal history of nicotine dependence: Secondary | ICD-10-CM

## 2016-06-07 DIAGNOSIS — D62 Acute posthemorrhagic anemia: Secondary | ICD-10-CM | POA: Diagnosis not present

## 2016-06-07 DIAGNOSIS — Y9289 Other specified places as the place of occurrence of the external cause: Secondary | ICD-10-CM | POA: Diagnosis not present

## 2016-06-07 DIAGNOSIS — Z9181 History of falling: Secondary | ICD-10-CM | POA: Diagnosis not present

## 2016-06-07 DIAGNOSIS — E11319 Type 2 diabetes mellitus with unspecified diabetic retinopathy without macular edema: Secondary | ICD-10-CM | POA: Diagnosis present

## 2016-06-07 DIAGNOSIS — D631 Anemia in chronic kidney disease: Secondary | ICD-10-CM | POA: Diagnosis present

## 2016-06-07 DIAGNOSIS — E669 Obesity, unspecified: Secondary | ICD-10-CM | POA: Diagnosis not present

## 2016-06-07 DIAGNOSIS — IMO0001 Reserved for inherently not codable concepts without codable children: Secondary | ICD-10-CM

## 2016-06-07 DIAGNOSIS — I272 Pulmonary hypertension, unspecified: Secondary | ICD-10-CM | POA: Diagnosis present

## 2016-06-07 DIAGNOSIS — Z955 Presence of coronary angioplasty implant and graft: Secondary | ICD-10-CM | POA: Diagnosis not present

## 2016-06-07 DIAGNOSIS — Z8249 Family history of ischemic heart disease and other diseases of the circulatory system: Secondary | ICD-10-CM

## 2016-06-07 DIAGNOSIS — D649 Anemia, unspecified: Secondary | ICD-10-CM

## 2016-06-07 DIAGNOSIS — K5903 Drug induced constipation: Secondary | ICD-10-CM | POA: Diagnosis not present

## 2016-06-07 DIAGNOSIS — S72432A Displaced fracture of medial condyle of left femur, initial encounter for closed fracture: Secondary | ICD-10-CM

## 2016-06-07 DIAGNOSIS — S32040A Wedge compression fracture of fourth lumbar vertebra, initial encounter for closed fracture: Secondary | ICD-10-CM | POA: Diagnosis present

## 2016-06-07 LAB — BASIC METABOLIC PANEL
ANION GAP: 18 — AB (ref 5–15)
BUN: 24 mg/dL — ABNORMAL HIGH (ref 6–20)
CHLORIDE: 98 mmol/L — AB (ref 101–111)
CO2: 24 mmol/L (ref 22–32)
Calcium: 9.3 mg/dL (ref 8.9–10.3)
Creatinine, Ser: 4.79 mg/dL — ABNORMAL HIGH (ref 0.44–1.00)
GFR calc non Af Amer: 9 mL/min — ABNORMAL LOW (ref >60)
GFR, EST AFRICAN AMERICAN: 10 mL/min — AB (ref >60)
Glucose, Bld: 109 mg/dL — ABNORMAL HIGH (ref 65–99)
Potassium: 4.6 mmol/L (ref 3.5–5.1)
SODIUM: 140 mmol/L (ref 135–145)

## 2016-06-07 LAB — CBC WITH DIFFERENTIAL/PLATELET
BASOS PCT: 0 %
Basophils Absolute: 0 10*3/uL (ref 0.0–0.1)
Eosinophils Absolute: 0.3 10*3/uL (ref 0.0–0.7)
Eosinophils Relative: 4 %
HEMATOCRIT: 32.3 % — AB (ref 36.0–46.0)
HEMOGLOBIN: 10.5 g/dL — AB (ref 12.0–15.0)
LYMPHS ABS: 0.8 10*3/uL (ref 0.7–4.0)
Lymphocytes Relative: 11 %
MCH: 31.6 pg (ref 26.0–34.0)
MCHC: 32.5 g/dL (ref 30.0–36.0)
MCV: 97.3 fL (ref 78.0–100.0)
MONOS PCT: 5 %
Monocytes Absolute: 0.4 10*3/uL (ref 0.1–1.0)
NEUTROS PCT: 80 %
Neutro Abs: 5.6 10*3/uL (ref 1.7–7.7)
Platelets: 184 10*3/uL (ref 150–400)
RBC: 3.32 MIL/uL — AB (ref 3.87–5.11)
RDW: 14.7 % (ref 11.5–15.5)
WBC: 7.1 10*3/uL (ref 4.0–10.5)

## 2016-06-07 MED ORDER — FENTANYL CITRATE (PF) 100 MCG/2ML IJ SOLN
100.0000 ug | Freq: Once | INTRAMUSCULAR | Status: AC
  Administered 2016-06-07: 100 ug via INTRAVENOUS
  Filled 2016-06-07: qty 2

## 2016-06-07 MED ORDER — HYDROMORPHONE HCL 2 MG/ML IJ SOLN
1.0000 mg | INTRAMUSCULAR | Status: DC | PRN
  Administered 2016-06-08 (×2): 1 mg via INTRAVENOUS
  Filled 2016-06-07 (×2): qty 1

## 2016-06-07 MED ORDER — HEPARIN SODIUM (PORCINE) 5000 UNIT/ML IJ SOLN
5000.0000 [IU] | Freq: Three times a day (TID) | INTRAMUSCULAR | Status: DC
  Administered 2016-06-08: 5000 [IU] via SUBCUTANEOUS
  Filled 2016-06-07: qty 1

## 2016-06-07 MED ORDER — OXYCODONE-ACETAMINOPHEN 5-325 MG PO TABS
1.0000 | ORAL_TABLET | Freq: Four times a day (QID) | ORAL | Status: DC | PRN
  Administered 2016-06-08: 2 via ORAL
  Filled 2016-06-07: qty 2

## 2016-06-07 NOTE — ED Notes (Signed)
Will obtain vitals when pt returns from MRI.

## 2016-06-07 NOTE — ED Triage Notes (Signed)
Per EMS pt was at home tried transferring from wheel chair to bathroom, found on L side. C/o pain in L knee, L kneecap looked displaced on arrival but no swelling or abnormality noted at present time. 100 fentanyl, pt is on plavix, denies LOC or hitting head. Fistula on R arm

## 2016-06-07 NOTE — ED Notes (Signed)
Patient transported to CT 

## 2016-06-07 NOTE — ED Provider Notes (Signed)
[name] DEPT Provider Note   CSN: [id number] Arrival date & time: 06/07/16  1533     History   Chief Complaint Chief Complaint  Patient presents with  . Fall    HPI [name] is a 71 y.o. female.  HPI Complains of left knee pain and right groin pain after she fell 3:30 PM today while transferring to a wheelchair. She cannot extend her left knee since the event. No other injury. Denies sugar head. She was feeling well prior to the event. Treated by EMS with supplemental oxygen and with fentanyl 100 g IV prior to arrival with partial improvement of pain. She denies any dyspnea denies chest pain denies abdominal pain denies neck pain denies back pain denies striking her head. No other associated symptoms. Past Medical History:  Diagnosis Date  . Anxiety   . Asthma   . CAD (coronary artery disease)    s/p MI x 2, s/p stent x 4 (05/2005 with DES placed at that time)  . Carpal tunnel syndrome, bilateral   . Chronic systolic congestive heart failure ([place])    EF 35-40% per 2D echo (03/2011)  . Constipation   . Diabetes mellitus type 2, controlled, with complications ([place])    diet controlled  . Diabetic retinopathy   . ESRD (end stage renal disease) on dialysis [facility]) HD since 07/2008   [facility]; Mon, [place], Friday  . Fibromyalgia   . Hemorrhoids   . Hyperlipidemia   . Hypertension   . Hypothyroid   . Major depression   . Migraines   . Morphea   . Normocytic anemia    BL Hgb 10-12  //  Chronic, likely multifactorial AOCD and possible iron deficiency component with transferrin sat < 20 historically.  . Osteoporosis   . PAD (peripheral artery disease) (HCC)    s/p L BKA  . Peripheral neuropathy ([place])   . PONV (postoperative nausea and vomiting)   . Pulmonary hypertension    PA Peak pressure 57 mmHg per 2D echo (03/2011)  . Refusal of blood transfusions as patient is Jehovah's Witness   . Secondary hyperparathyroidism ([place])   . Vitamin D deficiency     Patient  Active Problem List   Diagnosis Date Noted  . Acute respiratory failure ([place]) 08/09/2012  . Altered mental status 08/09/2012  . Narcotic overdose 08/09/2012  . Hyperkalemia 08/09/2012  . Chronic total occlusion of artery of the extremities ([place]) 10/16/2011  . Peripheral vascular disease, unspecified 10/16/2011  . ESRD (end stage renal disease) on dialysis ([place]) 04/08/2011  . Diabetes mellitus type II 04/08/2011  . Diabetic retinopathy associated with type 2 diabetes mellitus ([place]) 04/08/2011  . GERD (gastroesophageal reflux disease) 04/08/2011  . CAD, multiple vessel 04/08/2011  . PAD (peripheral artery disease) ([place]) 04/08/2011  . CHF (congestive heart failure) ([place]) 04/08/2011  . Hypertension 04/08/2011  . Fibromyalgia 04/08/2011  . Hypothyroid 04/08/2011  . Anxiety 04/08/2011  . Major depression 04/08/2011  . Peripheral neuropathy ([place]) 04/08/2011  . Morphea 04/08/2011  . Osteoporosis 04/08/2011  . Carpal tunnel syndrome on both sides 04/08/2011  . Refusal of blood transfusions as patient is Jehovah's Witness 04/08/2011  . Hyperparathyroidism, secondary ([place]) 04/08/2011  . Vitamin D deficiency 04/08/2011    Past Surgical History:  Procedure Laterality Date  . AV FISTULA PLACEMENT  ~ 2011   right upper arm  . BYPASS GRAFT  2006  . CARDIAC CATHETERIZATION  2006, 2007   pt had 4 stents placed  . CATARACT EXTRACTION,  BILATERAL Bilateral 2000  . CHOLECYSTECTOMY  1987  . HEMIARTHROPLASTY HIP Left 10/2009  . JOINT REPLACEMENT  2007   Partial placement Left Hip  . LEG AMPUTATION BELOW KNEE Left 10/2008   left  . PERIPHERAL ARTERIAL STENT GRAFT Bilateral    bilaterally "(left ~ 2010; right ~ 2011"  . VAGINAL HYSTERECTOMY  1994   with BSO    OB History    No data available       Home Medications    Prior to Admission medications   Medication Sig Start Date End Date Taking? Authorizing Provider  ALPRAZolam (XANAX) 0.25 MG tablet Take 0.25 mg by mouth 3 (three) times  daily as needed. As needed for anxiety.    Historical Provider, MD  aspirin 81 MG tablet Take 81 mg by mouth daily.      Historical Provider, MD  cetirizine (ZYRTEC) 10 MG tablet Take 10 mg by mouth daily.      Historical Provider, MD  clonazePAM (KLONOPIN) 1 MG tablet Take 1 mg by mouth 2 (two) times daily.      Historical Provider, MD  diphenhydrAMINE (BENADRYL) 25 mg capsule Take 25 mg by mouth every 6 (six) hours as needed. As needed for allergies.     Historical Provider, MD  docusate sodium (COLACE) 100 MG capsule Take 100 mg by mouth daily.      Historical Provider, MD  folic acid-vitamin b complex-vitamin c-selenium-zinc (DIALYVITE) 3 MG TABS Take 1 tablet by mouth daily.      Historical Provider, MD  hydrOXYzine (ATARAX/VISTARIL) 25 MG tablet Take 25 mg by mouth every 6 (six) hours as needed. As needed for itching.     Historical Provider, MD  levothyroxine (SYNTHROID, LEVOTHROID) 112 MCG tablet Take 112 mcg by mouth daily.      Historical Provider, MD  loperamide (IMODIUM) 2 MG capsule Take 2 mg by mouth 4 (four) times daily as needed. As needed for loose stools.     Historical Provider, MD  lovastatin (MEVACOR) 10 MG tablet Take 10 mg by mouth at bedtime.      Historical Provider, MD  metoprolol succinate (TOPROL-XL) 25 MG 24 hr tablet Take 25 mg by mouth daily.     Historical Provider, MD  oxyCODONE-acetaminophen (PERCOCET) 5-325 MG per tablet Take 1 tablet by mouth every 6 (six) hours as needed. As needed for pain.    Historical Provider, MD  promethazine (PHENERGAN) 25 MG tablet Take 25 mg by mouth every 8 (eight) hours as needed.      Historical Provider, MD    Family History Family History  Problem Relation Age of Onset  . Alcohol abuse Father   . Multiple myeloma Mother   . Diabetes type II Mother   . Hypertension Mother   . Cancer Mother   . Diabetes Mother   . Appendicitis Brother     Social History Social History  Substance Use Topics  . Smoking status: Former  Smoker    Packs/day: 2.00    Years: 30.00    Types: Cigarettes    Quit date: 04/07/1989  . Smokeless tobacco: Never Used  . Alcohol use 0.0 oz/week     Comment: "socially; I'm not an alcoholic"     Allergies   Other; Pumpkin seed oil-saw palmetto-zinc [propalmex]; Shrimp [shellfish allergy]; Morphine and related; and Penicillins   Review of Systems Review of Systems  Constitutional: Negative.   HENT: Negative.   Respiratory: Negative.   Cardiovascular: Negative.   Gastrointestinal: Negative.  Genitourinary:       Hemodialysis patient  Musculoskeletal: Positive for arthralgias and gait problem.       Wheelchair-bound. Left AKA  Skin: Negative.   Allergic/Immunologic: Positive for immunocompromised state.       Diabetic, hemodialysis patient  Psychiatric/Behavioral: Negative.   All other systems reviewed and are negative.    Physical Exam Updated Vital Signs BP 179/84   Pulse 73   Temp 98.4 F (36.9 C) (Oral)   Resp 18   Ht _0  (1.727 m)   Wt 178 lb (80.7 kg)   SpO2 100%   BMI 27.06 kg/m   Physical Exam  Constitutional: She appears well-developed and well-nourished. No distress.  HENT:  Head: Normocephalic and atraumatic.  Eyes: Conjunctivae are normal. Pupils are equal, round, and reactive to light.  Neck: Neck supple. No tracheal deviation present. No thyromegaly present.  Cardiovascular: Normal rate, regular rhythm and normal heart sounds.   No murmur heard. Pulmonary/Chest: Effort normal and breath sounds normal.  Abdominal: Soft. Bowel sounds are normal. She exhibits no distension. There is no tenderness.  Musculoskeletal: Normal range of motion. She exhibits no edema or tenderness.  Entire spine nontender. Pelvis tender right inguinal area no crepitance noted ecchymosis and swelling. Left lower extremity BKA,, skin intact, tender over the distal anterior thigh. knees tender and flexed at 90. All other extremities or contusion abrasion or tenderness  neurovascularly intact  Neurological: She is alert. Coordination normal.  Skin: Skin is warm and dry. No rash noted.  Psychiatric: She has a normal mood and affect.  Nursing note and vitals reviewed.    ED Treatments / Results  Labs (all labs ordered are listed, but only abnormal results are displayed) Labs Reviewed - No data to display  EKG  EKG Interpretation None       Radiology No results found.  Procedures Procedures (including critical care time)  Medications Ordered in ED Medications  fentaNYL (SUBLIMAZE) injection 100 mcg (not administered)     Results for orders placed or performed during the hospital encounter of 93/71/69  Basic metabolic panel  Result Value Ref Range   Sodium 140 135 - 145 mmol/L   Potassium 4.6 3.5 - 5.1 mmol/L   Chloride 98 (L) 101 - 111 mmol/L   CO2 24 22 - 32 mmol/L   Glucose, Bld 109 (H) 65 - 99 mg/dL   BUN 24 (H) 6 - 20 mg/dL   Creatinine, Ser 4.79 (H) 0.44 - 1.00 mg/dL   Calcium 9.3 8.9 - 10.3 mg/dL   GFR calc non Af Amer 9 (L) >60 mL/min   GFR calc Af Amer 10 (L) >60 mL/min   Anion gap 18 (H) 5 - 15  CBC with Differential/Platelet  Result Value Ref Range   WBC 7.1 4.0 - 10.5 K/uL   RBC 3.32 (L) 3.87 - 5.11 MIL/uL   Hemoglobin 10.5 (L) 12.0 - 15.0 g/dL   HCT 32.3 (L) 36.0 - 46.0 %   MCV 97.3 78.0 - 100.0 fL   MCH 31.6 26.0 - 34.0 pg   MCHC 32.5 30.0 - 36.0 g/dL   RDW 14.7 11.5 - 15.5 %   Platelets 184 150 - 400 K/uL   Neutrophils Relative % 80 %   Neutro Abs 5.6 1.7 - 7.7 K/uL   Lymphocytes Relative 11 %   Lymphs Abs 0.8 0.7 - 4.0 K/uL   Monocytes Relative 5 %   Monocytes Absolute 0.4 0.1 - 1.0 K/uL   Eosinophils Relative 4 %  Eosinophils Absolute 0.3 0.0 - 0.7 K/uL   Basophils Relative 0 %   Basophils Absolute 0.0 0.0 - 0.1 K/uL   Dg Knee 1-2 Views Left  Result Date: 06/07/2016 CLINICAL DATA:  Fall 3 hours ago.  Prior amputation. EXAM: LEFT KNEE - 1-2 VIEW COMPARISON:  02/11/2010 FINDINGS: Status post  below-the-knee amputation. Extensive vascular calcifications. Suboptimal ans atypical patient positioning, especially on the second image. Osteopenia. Fracture of the distal femoral metaphysis with probable impaction and angulation. This is suboptimally evaluated on the AP images secondary to positioning and altered anatomy. No dislocation. IMPRESSION: Distal femoral fracture, suboptimally evaluated secondary to chronic deformities and suboptimal positioning. Favored to be acute or subacute. CT could be informative to help delineate. Vascular calcifications. Electronically Signed   By: Abigail Miyamoto M.D.   On: 06/07/2016 17:31   Ct Pelvis Wo Contrast  Result Date: 06/07/2016 CLINICAL DATA:  Left hip pain after fall earlier today. EXAM: CT PELVIS WITHOUT CONTRAST TECHNIQUE: Multidetector CT imaging of the pelvis was performed following the standard protocol without intravenous contrast. COMPARISON:  Plain films of the pelvis of 11/13/2009. Prior CT of 04/29/2005. FINDINGS: Soft tissues: Degradation secondary to beam hardening artifact from left hip arthroplasty. Scattered colonic diverticula. Mild motion degradation throughout. Normal terminal ileum and appendix. Normal small bowel caliber. Advanced distal aortic and pelvic atherosclerosis. No pelvic sidewall adenopathy. Probable hysterectomy. Limited evaluation the adnexa. No free intraperitoneal air. No free fluid. Bilateral fat containing inguinal hernias. Fat containing lateral pelvic wall hernia including on image 67/series 202. Musculoskeletal: Pelvic anasarca, without well-defined hematoma. Soft tissue fullness, heterogeneity, and gas within the right psoas muscle including on the order of 4.0 x 4.2 cm on image 26/ series 202. This is new since the CT of 2006. Also likely new since the abdominal CT of 2016. Osteopenia. Left hip arthroplasty. 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 Degenerative sclerosis of the bilateral sacroiliac joints. A moderate compression  deformity involves the L4 vertebral body. There is gas within the vertebral body, including on sagittal image 84/series 206. Findings are new since 09/28/2014 abdominal study. Mild ventral canal encroachment. Advanced lumbosacral spondylosis. IMPRESSION: 1. Left hip arthroplasty, without evidence of complicating fracture or dislocation. Please note given the extent of beam hardening artifact from arthroplasty and osteopenia, CT is relatively low sensitivity for acute fracture and the test of choice is pelvic MRI. 2. New L4 compression deformity since 2016. Concurrent gas within the vertebral body as well as gas and soft tissue fullness within the right psoas muscle. Findings are overall suspicious for osteomyelitis and discitis with extension into the right psoas muscle representing infected hematoma or abscess. 3.  Possible constipation. These results were called by telephone at the time of interpretation on 06/07/2016 at 5:41 pm to Dr. Orlie Dakin , who verbally acknowledged these results. Electronically Signed   By: Abigail Miyamoto M.D.   On: 06/07/2016 17:45   Mr Lumbar Spine Wo Contrast  Result Date: 06/07/2016 CLINICAL DATA:  Status post fall.  Back pain. EXAM: MRI LUMBAR SPINE WITHOUT CONTRAST TECHNIQUE: Multiplanar, multisequence MR imaging of the lumbar spine was performed. No intravenous contrast was administered. COMPARISON:  CT abdomen and pelvis 06/07/2016. FINDINGS: Segmentation:  Standard. Alignment:  Physiologic. Vertebrae: There is an acute compression fracture of L4, significant superior endplate depression, diffuse bone marrow edema, no involvement of the pedicles. 3 mm retropulsed fragment. Conus medullaris: Extends to the L1 level and appears normal. Paraspinal and other soft tissues: There is T1 hypointense, T2 hyperintense heterogeneous fluid  collection enlarging the RIGHT psoas muscle. Representative cross-section as seen on axial image 26 of 36 x 40 mm. Small T1 and T2 hypointense bubbles  of air can be seen within. Psoas abscess is suspected. Disc levels: The L1-2, and L2-3 disc spaces are normal. At L3-4 the disc is unremarkable but there is mild stenosis related to the retropulsed bone and posterior element hypertrophy just below the interspace. At L4-5 there is mild stenosis related to posterior element hypertrophy. At L5-S1 there is advanced disc space narrowing. Osseous ridging with annular bulging is observed. 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 Tissue sampling is warranted. Acute L4 compression fracture, without evidence for discitis. 3 mm retropulsed bone. Given the air which is within the L4 vertebral body as seen on CT, L4 osteomyelitis is not excluded. Electronically Signed   By: Staci Righter M.D.   On: 06/07/2016 21:29    Initial Impression / Assessment and Plan / ED Course  I have reviewed the triage vital signs and the nursing notes.  Pertinent labs & imaging results that were available during my care of the patient were reviewed by me and considered in my medical decision making (see chart for details).  Clinical Course     Dr. Randel Pigg consulted for femur fracture, plan bed rest, nothing by mouth after midnight. He will see patient tomorrow. He requested that I contact neurosurgery regarding so as abscess and lumbar compression fracture. I consulted Dr. Annette Stable from neurosurgery who requests that interventional radiologist be consulted for psoas abscess, if patient is not septic appearing antibiotics be withheld until fluid is drained and sampled tomorrow. Internal medicine service should call him tomorrow for formal consultation. I also consulted Dr. Posey Pronto from internal medicine service who will arrange for admission. Patient's next hemodialysis is due 06/09/2016 Final diagnoses:  None    New Prescriptions New Prescriptions   No medications on file  Diagnosis #1 fall  #2 closed fracture of left distal femur  #3 acute compression fracture of L4    #5 psoas abscess #6 chronic renal failure 27 anemia    Orlie Dakin, MD 06/07/16 2303

## 2016-06-07 NOTE — ED Notes (Signed)
Pt oxygen saturations dropped to 89% on room air. Placed on 2L nasal cannula saturations 97%

## 2016-06-08 ENCOUNTER — Encounter (HOSPITAL_COMMUNITY): Payer: Self-pay

## 2016-06-08 ENCOUNTER — Inpatient Hospital Stay (HOSPITAL_COMMUNITY): Payer: Medicare (Managed Care)

## 2016-06-08 DIAGNOSIS — Y9289 Other specified places as the place of occurrence of the external cause: Secondary | ICD-10-CM

## 2016-06-08 DIAGNOSIS — I2510 Atherosclerotic heart disease of native coronary artery without angina pectoris: Secondary | ICD-10-CM

## 2016-06-08 DIAGNOSIS — I132 Hypertensive heart and chronic kidney disease with heart failure and with stage 5 chronic kidney disease, or end stage renal disease: Secondary | ICD-10-CM

## 2016-06-08 DIAGNOSIS — Z79899 Other long term (current) drug therapy: Secondary | ICD-10-CM

## 2016-06-08 DIAGNOSIS — Z885 Allergy status to narcotic agent status: Secondary | ICD-10-CM

## 2016-06-08 DIAGNOSIS — S7292XA Unspecified fracture of left femur, initial encounter for closed fracture: Secondary | ICD-10-CM | POA: Diagnosis present

## 2016-06-08 DIAGNOSIS — Z880 Allergy status to penicillin: Secondary | ICD-10-CM

## 2016-06-08 DIAGNOSIS — W1811XA Fall from or off toilet without subsequent striking against object, initial encounter: Secondary | ICD-10-CM

## 2016-06-08 DIAGNOSIS — Z91018 Allergy to other foods: Secondary | ICD-10-CM

## 2016-06-08 DIAGNOSIS — I5022 Chronic systolic (congestive) heart failure: Secondary | ICD-10-CM

## 2016-06-08 DIAGNOSIS — R188 Other ascites: Secondary | ICD-10-CM | POA: Diagnosis present

## 2016-06-08 DIAGNOSIS — Z9109 Other allergy status, other than to drugs and biological substances: Secondary | ICD-10-CM

## 2016-06-08 DIAGNOSIS — Y9389 Activity, other specified: Secondary | ICD-10-CM

## 2016-06-08 DIAGNOSIS — Z91013 Allergy to seafood: Secondary | ICD-10-CM

## 2016-06-08 DIAGNOSIS — E039 Hypothyroidism, unspecified: Secondary | ICD-10-CM

## 2016-06-08 DIAGNOSIS — Z955 Presence of coronary angioplasty implant and graft: Secondary | ICD-10-CM

## 2016-06-08 DIAGNOSIS — Z91010 Allergy to peanuts: Secondary | ICD-10-CM

## 2016-06-08 DIAGNOSIS — Z993 Dependence on wheelchair: Secondary | ICD-10-CM

## 2016-06-08 LAB — PROTIME-INR
INR: 1.05
Prothrombin Time: 13.7 seconds (ref 11.4–15.2)

## 2016-06-08 LAB — RENAL FUNCTION PANEL
ALBUMIN: 2.8 g/dL — AB (ref 3.5–5.0)
ANION GAP: 14 (ref 5–15)
BUN: 31 mg/dL — ABNORMAL HIGH (ref 6–20)
CO2: 27 mmol/L (ref 22–32)
CREATININE: 5.41 mg/dL — AB (ref 0.44–1.00)
Calcium: 9.1 mg/dL (ref 8.9–10.3)
Chloride: 99 mmol/L — ABNORMAL LOW (ref 101–111)
GFR calc Af Amer: 9 mL/min — ABNORMAL LOW (ref >60)
GFR calc non Af Amer: 7 mL/min — ABNORMAL LOW (ref >60)
GLUCOSE: 126 mg/dL — AB (ref 65–99)
PHOSPHORUS: 7.9 mg/dL — AB (ref 2.5–4.6)
Potassium: 4.7 mmol/L (ref 3.5–5.1)
SODIUM: 140 mmol/L (ref 135–145)

## 2016-06-08 LAB — CBC
HEMATOCRIT: 31.3 % — AB (ref 36.0–46.0)
HEMOGLOBIN: 10 g/dL — AB (ref 12.0–15.0)
MCH: 31.3 pg (ref 26.0–34.0)
MCHC: 31.9 g/dL (ref 30.0–36.0)
MCV: 97.8 fL (ref 78.0–100.0)
Platelets: 156 10*3/uL (ref 150–400)
RBC: 3.2 MIL/uL — ABNORMAL LOW (ref 3.87–5.11)
RDW: 15 % (ref 11.5–15.5)
WBC: 7.4 10*3/uL (ref 4.0–10.5)

## 2016-06-08 LAB — SURGICAL PCR SCREEN
MRSA, PCR: POSITIVE — AB
Staphylococcus aureus: POSITIVE — AB

## 2016-06-08 LAB — TROPONIN I: Troponin I: <0.03 ng/mL (ref <0.03)

## 2016-06-08 LAB — NO BLOOD PRODUCTS

## 2016-06-08 LAB — APTT: APTT: 32 s (ref 24–36)

## 2016-06-08 LAB — GLUCOSE, CAPILLARY: Glucose-Capillary: 109 mg/dL — ABNORMAL HIGH (ref 65–99)

## 2016-06-08 MED ORDER — HYDROMORPHONE HCL 2 MG/ML IJ SOLN
2.0000 mg | INTRAMUSCULAR | Status: DC | PRN
Start: 2016-06-08 — End: 2016-06-10
  Administered 2016-06-08 – 2016-06-09 (×2): 2 mg via INTRAVENOUS
  Filled 2016-06-08: qty 1

## 2016-06-08 MED ORDER — FENTANYL CITRATE (PF) 100 MCG/2ML IJ SOLN
INTRAMUSCULAR | Status: AC
  Filled 2016-06-08: qty 4

## 2016-06-08 MED ORDER — LEVOTHYROXINE SODIUM 112 MCG PO TABS
112.0000 ug | ORAL_TABLET | Freq: Every day | ORAL | Status: DC
  Administered 2016-06-08 – 2016-06-12 (×5): 112 ug via ORAL
  Filled 2016-06-08 (×7): qty 1

## 2016-06-08 MED ORDER — RENA-VITE PO TABS
1.0000 | ORAL_TABLET | Freq: Every day | ORAL | Status: DC
  Administered 2016-06-09: 1 via ORAL
  Filled 2016-06-08: qty 1

## 2016-06-08 MED ORDER — SODIUM CHLORIDE 0.9 % IV BOLUS (SEPSIS)
250.0000 mL | Freq: Once | INTRAVENOUS | Status: AC
  Administered 2016-06-08: 250 mL via INTRAVENOUS

## 2016-06-08 MED ORDER — FENTANYL CITRATE (PF) 100 MCG/2ML IJ SOLN
INTRAMUSCULAR | Status: AC | PRN
  Administered 2016-06-08: 25 ug via INTRAVENOUS

## 2016-06-08 MED ORDER — METOPROLOL SUCCINATE ER 25 MG PO TB24
25.0000 mg | ORAL_TABLET | Freq: Every day | ORAL | Status: DC
  Administered 2016-06-08 – 2016-06-12 (×3): 25 mg via ORAL
  Filled 2016-06-08 (×4): qty 1

## 2016-06-08 MED ORDER — LANTHANUM CARBONATE 500 MG PO CHEW
500.0000 mg | CHEWABLE_TABLET | Freq: Three times a day (TID) | ORAL | Status: DC
  Administered 2016-06-08 – 2016-06-12 (×12): 500 mg via ORAL
  Filled 2016-06-08 (×11): qty 1

## 2016-06-08 MED ORDER — MIDAZOLAM HCL 2 MG/2ML IJ SOLN
INTRAMUSCULAR | Status: AC | PRN
  Administered 2016-06-08: 0.5 mg via INTRAVENOUS

## 2016-06-08 MED ORDER — MIDAZOLAM HCL 2 MG/2ML IJ SOLN
INTRAMUSCULAR | Status: AC
  Filled 2016-06-08: qty 4

## 2016-06-08 MED ORDER — PRAVASTATIN SODIUM 20 MG PO TABS
20.0000 mg | ORAL_TABLET | Freq: Every day | ORAL | Status: DC
  Administered 2016-06-08 – 2016-06-12 (×5): 20 mg via ORAL
  Filled 2016-06-08 (×4): qty 1

## 2016-06-08 MED ORDER — LIDOCAINE-EPINEPHRINE 1 %-1:100000 IJ SOLN
INTRAMUSCULAR | Status: AC
  Filled 2016-06-08: qty 1

## 2016-06-08 MED ORDER — ASPIRIN 81 MG PO CHEW
81.0000 mg | CHEWABLE_TABLET | Freq: Every day | ORAL | Status: DC
  Administered 2016-06-08 – 2016-06-12 (×5): 81 mg via ORAL
  Filled 2016-06-08 (×5): qty 1

## 2016-06-08 MED ORDER — OXYCODONE-ACETAMINOPHEN 5-325 MG PO TABS
1.0000 | ORAL_TABLET | ORAL | Status: DC
  Administered 2016-06-08 – 2016-06-10 (×10): 2 via ORAL
  Filled 2016-06-08 (×10): qty 2

## 2016-06-08 NOTE — Consult Note (Signed)
Reason for Consult: Possible L4 osteomyelitis Referring Physician: Internal medicine teaching service  [name] is an 71 y.o. female.  HPI: Patient is a 71 year old female with multiple medical problems including significant diabetes mellitus and peripheral vascular disease. She is status post left lower extremity amputation. Patient admitted after mechanical fall striking her left knee. Workup demonstrated a left-sided distal femur fracture. Incidentally during workup patient noted to have abnormality of her lower lumbar spine. Further workup demonstrates evidence of a significant right-sided psoas abscess with an associated compression fracture of L4 and moderate stenosis at L4-5. The patient does not have true radicular pain or numbness. She has chronic right lower extremity weakness and is essentially nonambulatory depending on a wheelchair. This is been true for many months. The patient denies right lower extremity pain. The patient denies back pain. Most of her left lower extremity pain seems to be centered around the area of her fracture.   Past Medical History:  Diagnosis Date  . Anxiety   . Asthma   . CAD (coronary artery disease)    s/p MI x 2, s/p stent x 4 (05/2005 with DES placed at that time)  . Carpal tunnel syndrome, bilateral   . Chronic systolic congestive heart failure ([place])    EF 35-40% per 2D echo (03/2011)  . Constipation   . Diabetes mellitus type 2, controlled, with complications ([place])    diet controlled  . Diabetic retinopathy   . ESRD (end stage renal disease) on dialysis [facility] [place]) HD since 07/2008   [facility]; Mon, [place], Friday  . Fibromyalgia   . Hemorrhoids   . Hyperlipidemia   . Hypertension   . Hypothyroid   . Major depression   . Migraines   . Morphea   . Normocytic anemia    BL Hgb 10-12  //  Chronic, likely multifactorial AOCD and possible iron deficiency component with transferrin sat < 20 historically.  . Osteoporosis   . PAD (peripheral artery  disease) ([facility])    s/p L BKA  . Peripheral neuropathy ([place])   . PONV (postoperative nausea and vomiting)   . Pulmonary hypertension    PA Peak pressure 57 mmHg per 2D echo (03/2011)  . Refusal of blood transfusions as patient is Jehovah's Witness   . Secondary hyperparathyroidism ([place])   . Vitamin D deficiency     Past Surgical History:  Procedure Laterality Date  . AV FISTULA PLACEMENT  ~ 2011   right upper arm  . BYPASS GRAFT  2006  . CARDIAC CATHETERIZATION  2006, 2007   pt had 4 stents placed  . CATARACT EXTRACTION, BILATERAL Bilateral 2000  . CHOLECYSTECTOMY  1987  . HEMIARTHROPLASTY HIP Left 10/2009  . JOINT REPLACEMENT  2007   Partial placement Left Hip  . LEG AMPUTATION BELOW KNEE Left 10/2008   left  . PERIPHERAL ARTERIAL STENT GRAFT Bilateral    bilaterally "(left ~ 2010; right ~ 2011"  . VAGINAL HYSTERECTOMY  1994   with BSO    Family History  Problem Relation Age of Onset  . Alcohol abuse Father   . Multiple myeloma Mother   . Diabetes type II Mother   . Hypertension Mother   . Cancer Mother   . Diabetes Mother   . Appendicitis Brother     Social History:  reports that she quit smoking about 27 years ago. Her smoking use included Cigarettes. She has a 60.00 pack-year smoking history. She has never used smokeless tobacco. She reports that she does  not drink alcohol or use drugs.  Allergies:  Allergies  Allergen Reactions  . Other Anaphylaxis    peaches  . Pumpkin Seed Oil-Saw Palmetto-Zinc [Propalmex] Anaphylaxis  . Shrimp [Shellfish Allergy] Anaphylaxis  . Morphine And Related Other (See Comments)    Pt fell into deep sleep and could not be woken up had to be resuscitated says she did not overdose, unsure whether throat swelled or SOB pt does not remember  . Penicillins     Tongue swelling.     Medications: I have reviewed the patient's current medications.  Results for orders placed or performed during the hospital encounter of 06/07/16 (from the  past 48 hour(s))  Basic metabolic panel     Status: Abnormal   Collection Time: 06/07/16  6:16 PM  Result Value Ref Range   Sodium 140 135 - 145 mmol/L   Potassium 4.6 3.5 - 5.1 mmol/L   Chloride 98 (L) 101 - 111 mmol/L   CO2 24 22 - 32 mmol/L   Glucose, Bld 109 (H) 65 - 99 mg/dL   BUN 24 (H) 6 - 20 mg/dL   Creatinine, Ser 4.79 (H) 0.44 - 1.00 mg/dL   Calcium 9.3 8.9 - 10.3 mg/dL   GFR calc non Af Amer 9 (L) >60 mL/min   GFR calc Af Amer 10 (L) >60 mL/min    Comment: (NOTE) The eGFR has been calculated using the CKD EPI equation. This calculation has not been validated in all clinical situations. eGFR's persistently <60 mL/min signify possible Chronic Kidney Disease.    Anion gap 18 (H) 5 - 15  CBC with Differential/Platelet     Status: Abnormal   Collection Time: 06/07/16  6:16 PM  Result Value Ref Range   WBC 7.1 4.0 - 10.5 K/uL   RBC 3.32 (L) 3.87 - 5.11 MIL/uL   Hemoglobin 10.5 (L) 12.0 - 15.0 g/dL   HCT 32.3 (L) 36.0 - 46.0 %   MCV 97.3 78.0 - 100.0 fL   MCH 31.6 26.0 - 34.0 pg   MCHC 32.5 30.0 - 36.0 g/dL   RDW 14.7 11.5 - 15.5 %   Platelets 184 150 - 400 K/uL   Neutrophils Relative % 80 %   Neutro Abs 5.6 1.7 - 7.7 K/uL   Lymphocytes Relative 11 %   Lymphs Abs 0.8 0.7 - 4.0 K/uL   Monocytes Relative 5 %   Monocytes Absolute 0.4 0.1 - 1.0 K/uL   Eosinophils Relative 4 %   Eosinophils Absolute 0.3 0.0 - 0.7 K/uL   Basophils Relative 0 %   Basophils Absolute 0.0 0.0 - 0.1 K/uL  No blood products     Status: None   Collection Time: 06/07/16 11:53 PM  Result Value Ref Range   Transfuse no blood products      TRANSFUSE NO BLOOD PRODUCTS, VERIFIED BY Lianne Bushy  Surgical PCR screen     Status: Abnormal   Collection Time: 06/08/16  1:19 AM  Result Value Ref Range   MRSA, PCR POSITIVE (A) NEGATIVE    Comment: RESULT CALLED TO, READ BACK BY AND VERIFIED WITH:  TO N(ANDERSON) BY TCLEVELAND 06/08/2016 AT 0439    Staphylococcus aureus POSITIVE (A) NEGATIVE     Comment:        The Xpert SA Assay (FDA approved for NASAL specimens in patients over 42 years of age), is one component of a comprehensive surveillance program.  Test performance has been validated by [facility] for patients greater than or equal to  3 year old. It is not intended to diagnose infection nor to guide or monitor treatment.   CBC     Status: Abnormal   Collection Time: 06/08/16  5:11 AM  Result Value Ref Range   WBC 7.4 4.0 - 10.5 K/uL   RBC 3.20 (L) 3.87 - 5.11 MIL/uL   Hemoglobin 10.0 (L) 12.0 - 15.0 g/dL   HCT 31.3 (L) 36.0 - 46.0 %   MCV 97.8 78.0 - 100.0 fL   MCH 31.3 26.0 - 34.0 pg   MCHC 31.9 30.0 - 36.0 g/dL   RDW 15.0 11.5 - 15.5 %   Platelets 156 150 - 400 K/uL  Renal function panel     Status: Abnormal   Collection Time: 06/08/16  5:11 AM  Result Value Ref Range   Sodium 140 135 - 145 mmol/L   Potassium 4.7 3.5 - 5.1 mmol/L   Chloride 99 (L) 101 - 111 mmol/L   CO2 27 22 - 32 mmol/L   Glucose, Bld 126 (H) 65 - 99 mg/dL   BUN 31 (H) 6 - 20 mg/dL   Creatinine, Ser 5.41 (H) 0.44 - 1.00 mg/dL   Calcium 9.1 8.9 - 10.3 mg/dL   Phosphorus 7.9 (H) 2.5 - 4.6 mg/dL   Albumin 2.8 (L) 3.5 - 5.0 g/dL   GFR calc non Af Amer 7 (L) >60 mL/min   GFR calc Af Amer 9 (L) >60 mL/min    Comment: (NOTE) The eGFR has been calculated using the CKD EPI equation. This calculation has not been validated in all clinical situations. eGFR's persistently <60 mL/min signify possible Chronic Kidney Disease.    Anion gap 14 5 - 15  Glucose, capillary     Status: Abnormal   Collection Time: 06/08/16  8:25 AM  Result Value Ref Range   Glucose-Capillary 109 (H) 65 - 99 mg/dL  APTT     Status: None   Collection Time: 06/08/16  9:24 AM  Result Value Ref Range   aPTT 32 24 - 36 seconds  Protime-INR     Status: None   Collection Time: 06/08/16  9:24 AM  Result Value Ref Range   Prothrombin Time 13.7 11.4 - 15.2 seconds   INR 1.05     Dg Knee 1-2 Views Left  Result  Date: 06/07/2016 CLINICAL DATA:  Fall 3 hours ago.  Prior amputation. EXAM: LEFT KNEE - 1-2 VIEW COMPARISON:  02/11/2010 FINDINGS: Status post below-the-knee amputation. Extensive vascular calcifications. Suboptimal ans atypical patient positioning, especially on the second image. Osteopenia. Fracture of the distal femoral metaphysis with probable impaction and angulation. This is suboptimally evaluated on the AP images secondary to positioning and altered anatomy. No dislocation. IMPRESSION: Distal femoral fracture, suboptimally evaluated secondary to chronic deformities and suboptimal positioning. Favored to be acute or subacute. CT could be informative to help delineate. Vascular calcifications. Electronically Signed   By: Abigail Miyamoto M.D.   On: 06/07/2016 17:31   Ct Pelvis Wo Contrast  Result Date: 06/07/2016 CLINICAL DATA:  Left hip pain after fall earlier today. EXAM: CT PELVIS WITHOUT CONTRAST TECHNIQUE: Multidetector CT imaging of the pelvis was performed following the standard protocol without intravenous contrast. COMPARISON:  Plain films of the pelvis of 11/13/2009. Prior CT of 04/29/2005. FINDINGS: Soft tissues: Degradation secondary to beam hardening artifact from left hip arthroplasty. Scattered colonic diverticula. Mild motion degradation throughout. Normal terminal ileum and appendix. Normal small bowel caliber. Advanced distal aortic and pelvic atherosclerosis. No pelvic sidewall adenopathy. Probable hysterectomy. Limited evaluation  the adnexa. No free intraperitoneal air. No free fluid. Bilateral fat containing inguinal hernias. Fat containing lateral pelvic wall hernia including on image 67/series 202. Musculoskeletal: Pelvic anasarca, without well-defined hematoma. Soft tissue fullness, heterogeneity, and gas within the right psoas muscle including on the order of 4.0 x 4.2 cm on image 26/ series 202. This is new since the CT of 2006. Also likely new since the abdominal CT of 2016.  Osteopenia. Left hip arthroplasty. 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 Degenerative sclerosis of the bilateral sacroiliac joints. A moderate compression deformity involves the L4 vertebral body. There is gas within the vertebral body, including on sagittal image 84/series 206. Findings are new since 09/28/2014 abdominal study. Mild ventral canal encroachment. Advanced lumbosacral spondylosis. IMPRESSION: 1. Left hip arthroplasty, without evidence of complicating fracture or dislocation. Please note given the extent of beam hardening artifact from arthroplasty and osteopenia, CT is relatively low sensitivity for acute fracture and the test of choice is pelvic MRI. 2. New L4 compression deformity since 2016. Concurrent gas within the vertebral body as well as gas and soft tissue fullness within the right psoas muscle. Findings are overall suspicious for osteomyelitis and discitis with extension into the right psoas muscle representing infected hematoma or abscess. 3.  Possible constipation. These results were called by telephone at the time of interpretation on 06/07/2016 at 5:41 pm to Dr. Orlie Dakin , who verbally acknowledged these results. Electronically Signed   By: Abigail Miyamoto M.D.   On: 06/07/2016 17:45   Mr Lumbar Spine Wo Contrast  Result Date: 06/07/2016 CLINICAL DATA:  Status post fall.  Back pain. EXAM: MRI LUMBAR SPINE WITHOUT CONTRAST TECHNIQUE: Multiplanar, multisequence MR imaging of the lumbar spine was performed. No intravenous contrast was administered. COMPARISON:  CT abdomen and pelvis 06/07/2016. FINDINGS: Segmentation:  Standard. Alignment:  Physiologic. Vertebrae: There is an acute compression fracture of L4, significant superior endplate depression, diffuse bone marrow edema, no involvement of the pedicles. 3 mm retropulsed fragment. Conus medullaris: Extends to the L1 level and appears normal. Paraspinal and other soft tissues: There is T1 hypointense, T2 hyperintense heterogeneous fluid  collection enlarging the RIGHT psoas muscle. Representative cross-section as seen on axial image 26 of 36 x 40 mm. Small T1 and T2 hypointense bubbles of air can be seen within. Psoas abscess is suspected. Disc levels: The L1-2, and L2-3 disc spaces are normal. At L3-4 the disc is unremarkable but there is mild stenosis related to the retropulsed bone and posterior element hypertrophy just below the interspace. At L4-5 there is mild stenosis related to posterior element hypertrophy. At L5-S1 there is advanced disc space narrowing. Osseous ridging with annular bulging is observed. 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 Tissue sampling is warranted. Acute L4 compression fracture, without evidence for discitis. 3 mm retropulsed bone. Given the air which is within the L4 vertebral body as seen on CT, L4 osteomyelitis is not excluded. Electronically Signed   By: Staci Righter M.D.   On: 06/07/2016 21:29   Mr Pelvis Wo Contrast  Result Date: 06/07/2016 CLINICAL DATA:  Pain in the left hip and right knee. Changes suggesting discitis/ osteomyelitis seen on CT. EXAM: MRI PELVIS WITHOUT CONTRAST TECHNIQUE: Multiplanar multisequence MR imaging of the pelvis was performed. No intravenous contrast was administered. COMPARISON:  MRI lumbar spine 06/07/2016.  CT pelvis 06/07/2016. FINDINGS: The examination is technically limited due to prominent susceptibility artifact arising from left hip arthroplasty and due to motion artifact as well as wraparound artifact.  Urinary Tract: Bladder wall is not thickened and no filling defects are demonstrated. No distal ureteral dilatation. Bowel: Visualized portions of small and large bowel are not abnormally distended. Vascular/Lymphatic: No significant lymphadenopathy in the pelvis. Visualized iliac and femoral arteries and veins are normal in caliber with normal flow voids demonstrated. Reproductive: Uterus appears surgically absent. Ovaries are not specifically  demonstrated but no abnormal adnexal masses are seen. Other: Small fat containing stick daily in hernia on the left. Small right inguinal hernia containing fat. Musculoskeletal: Diffuse muscular atrophy involving the visualized pelvic musculature. Compression deformity and increased T2 signal intensity demonstrated within the L4 vertebra. This is more completely demonstrated on the MRI lumbar spine and CT. Overall appearance is worrisome for osteomyelitis. See additional reports. Diffuse increased T2 signal intensity and expansion of the right iliopsoas muscle with focal circumscribed collection measuring 1.8 x 4.6 cm in diameter. There is Less prominent T2 signal intensity in the left psoas muscle. Findings are consistent with inflammatory change and likely represents psoas abscess. Postoperative left hip arthroplasty. Susceptibility artifact arising from the metallic hardware precludes evaluation of the left hip arthroplasty and surrounding bone structures. There is increased T2 signal intensity in the soft tissues around the visualized midshaft of the femur, extending below the field of view. Given the finding of distal left femoral fracture on plain films of the knee, this likely represents hematoma or edema related to the fractures. Additional femoral shaft fractures are not excluded. Bone marrow signal intensities in the visualized pelvis, sacrum, and right hip are unremarkable without evidence of fracture or contusion. IMPRESSION: 1. Abscess and inflammatory changes demonstrated in the right ileus psoas muscle and less prominent in the left psoas muscle. Compression of the L4 vertebra possibly indicating osteomyelitis. See additional reports of CT and lumbar spine MR from earlier today. 2. Large amount of susceptibility artifact arises from left hip arthroplasty precluding evaluation of the left hip for tractor or dislocation. There is soft tissue edema in the musculature around the visualized midshaft femur,  probably arising from the known distal femoral fracture. Otherwise, no marrow changes to suggest pelvic, sacral, or right hip fractures. 3. Incidental finding of small left stick galea and and right inguinal hernias containing fat. Electronically Signed   By: Lucienne Capers M.D.   On: 06/07/2016 23:01     Blood pressure (!) 127/47, pulse 86, temperature 97.7 F (36.5 C), temperature source Oral, resp. rate 18, height 5' 8" (1.727 m), weight 80.7 kg (178 lb), SpO2 100 %. Patient is awake and alert. She is oriented and appropriate. She does not appear toxic nor does she appear significantly uncomfortable. Examination of her head ears eyes and throat is unremarkable. Chest and abdomen are benign. Extremities reveal her left BKA with significant swelling and tenderness distally. Otherwise extremities normal. Neurologically she is awake and alert. She is oriented and appropriate. Her speech is fluent. Cranial nerve function is intact. Motor examination of the extremities revealed good motor strength in both lower extremities. She has no pain with straight leg raising on the right side. She has minimal if any lumbar tenderness.  Assessment/Plan: This is an unusual situation. Imaging scleral he shows evidence of a so as abscess and L4 fracture worrisome for osteomyelitis and even discitis at the L4-5 level that being said the patient seems to be reasonably asymptomatic from this. This is quite uncommon as this is typically a very painful condition. The patient is scheduled to undergo biopsy and aspiration of her psoas abscess. This  will hopefully shed better light of the situation. I would empirically cover for staph osteomyelitis until cultures come back. I think an ID consult would be appropriate. I do not see any need for surgical intervention for my standpoint.  , A 06/08/2016, 1:50 PM

## 2016-06-08 NOTE — Consult Note (Signed)
[facility] Renal Consultation Note    Indication for Consultation:  Management of ESRD/hemodialysis; anemia, hypertension/volume and secondary hyperparathyroidism  HPI: [name] is a 71 y.o. female with ESRD on hemodialysis MWF. ESRD secondary to DM. [name] presented to ED yesterday after falling while transferring from wheelchair. She is s/p L BKA and wheelchair dependent. Imaging revealed L distal femur fracture and new L4 compression fracture with possible osteomyelitis. Additional finding of abscess in R psoas muscle on MRI. She is admitted with ortho and neurosurgery w/u pending.  She is seen at bedside currently, she does report pain in her leg and back, but no other complaints currently. She says she has been feeling depressed lately with the recent passing of her mother. She denies fever, chills, dyspnea, chest pain, nausea, vomiting. She has been wheelchair dependent for 5 years and says this is her first fall. She is compliant with HD, no recent missed or shortened treatments.   Past Medical History:  Diagnosis Date  . Anxiety   . Asthma   . CAD (coronary artery disease)    s/p MI x 2, s/p stent x 4 (05/2005 with DES placed at that time)  . Carpal tunnel syndrome, bilateral   . Chronic systolic congestive heart failure ([place])    EF 35-40% per 2D echo (03/2011)  . Constipation   . Diabetes mellitus type 2, controlled, with complications ([place])    diet controlled  . Diabetic retinopathy   . ESRD (end stage renal disease) on dialysis [facility]) HD since 07/2008   [facility]; Mon, [place], Friday  . Fibromyalgia   . Hemorrhoids   . Hyperlipidemia   . Hypertension   . Hypothyroid   . Major depression   . Migraines   . Morphea   . Normocytic anemia    BL Hgb 10-12  //  Chronic, likely multifactorial AOCD and possible iron deficiency component with transferrin sat < 20 historically.  . Osteoporosis   . PAD (peripheral artery disease) (HCC)    s/p L BKA  .  Peripheral neuropathy ([place])   . PONV (postoperative nausea and vomiting)   . Pulmonary hypertension    PA Peak pressure 57 mmHg per 2D echo (03/2011)  . Refusal of blood transfusions as patient is Jehovah's Witness   . Secondary hyperparathyroidism ([place])   . Vitamin D deficiency    Past Surgical History:  Procedure Laterality Date  . AV FISTULA PLACEMENT  ~ 2011   right upper arm  . BYPASS GRAFT  2006  . CARDIAC CATHETERIZATION  2006, 2007   pt had 4 stents placed  . CATARACT EXTRACTION, BILATERAL Bilateral 2000  . CHOLECYSTECTOMY  1987  . HEMIARTHROPLASTY HIP Left 10/2009  . JOINT REPLACEMENT  2007   Partial placement Left Hip  . LEG AMPUTATION BELOW KNEE Left 10/2008   left  . PERIPHERAL ARTERIAL STENT GRAFT Bilateral    bilaterally "(left ~ 2010; right ~ 2011"  . VAGINAL HYSTERECTOMY  1994   with BSO   Family History  Problem Relation Age of Onset  . Alcohol abuse Father   . Multiple myeloma Mother   . Diabetes type II Mother   . Hypertension Mother   . Cancer Mother   . Diabetes Mother   . Appendicitis Brother    Social History:  reports that she quit smoking about 27 years ago. Her smoking use included Cigarettes. She has a 60.00 pack-year smoking history. She has never used smokeless tobacco. She reports that she  does not drink alcohol or use drugs. Allergies  Allergen Reactions  . Other Anaphylaxis    peaches  . Pumpkin Seed Oil-Saw Palmetto-Zinc [Propalmex] Anaphylaxis  . Shrimp [Shellfish Allergy] Anaphylaxis  . Morphine And Related Other (See Comments)    Pt fell into deep sleep and could not be woken up had to be resuscitated says she did not overdose, unsure whether throat swelled or SOB pt does not remember  . Penicillins     Tongue swelling.    Prior to Admission medications   Medication Sig Start Date End Date Taking? Authorizing Provider  ALPRAZolam (XANAX) 0.25 MG tablet Take 0.25 mg by mouth 3 (three) times daily as needed. As needed for anxiety.    Yes Historical Provider, MD  clonazePAM (KLONOPIN) 1 MG tablet Take 1 mg by mouth 2 (two) times daily.     Yes Historical Provider, MD  aspirin 81 MG tablet Take 81 mg by mouth daily.      Historical Provider, MD  cetirizine (ZYRTEC) 10 MG tablet Take 10 mg by mouth daily.      Historical Provider, MD  diphenhydrAMINE (BENADRYL) 25 mg capsule Take 25 mg by mouth every 6 (six) hours as needed. As needed for allergies.     Historical Provider, MD  docusate sodium (COLACE) 100 MG capsule Take 100 mg by mouth daily.      Historical Provider, MD  folic acid-vitamin b complex-vitamin c-selenium-zinc (DIALYVITE) 3 MG TABS Take 1 tablet by mouth daily.      Historical Provider, MD  hydrOXYzine (ATARAX/VISTARIL) 25 MG tablet Take 25 mg by mouth every 6 (six) hours as needed. As needed for itching.     Historical Provider, MD  levothyroxine (SYNTHROID, LEVOTHROID) 112 MCG tablet Take 112 mcg by mouth daily.      Historical Provider, MD  loperamide (IMODIUM) 2 MG capsule Take 2 mg by mouth 4 (four) times daily as needed. As needed for loose stools.     Historical Provider, MD  lovastatin (MEVACOR) 10 MG tablet Take 10 mg by mouth at bedtime.      Historical Provider, MD  metoprolol succinate (TOPROL-XL) 25 MG 24 hr tablet Take 25 mg by mouth daily.     Historical Provider, MD  oxyCODONE-acetaminophen (PERCOCET) 5-325 MG per tablet Take 1 tablet by mouth every 6 (six) hours as needed. As needed for pain.    Historical Provider, MD  promethazine (PHENERGAN) 25 MG tablet Take 25 mg by mouth every 8 (eight) hours as needed.      Historical Provider, MD   Current Facility-Administered Medications  Medication Dose Route Frequency Provider Last Rate Last Dose  . aspirin chewable tablet 81 mg  81 mg Oral Daily Ophelia Shoulder, MD      . HYDROmorphone (DILAUDID) injection 2 mg  2 mg Intravenous Q4H PRN Bethany Molt, DO   2 mg at 06/08/16 1051  . levothyroxine (SYNTHROID, LEVOTHROID) tablet 112 mcg  112 mcg Oral QAC  breakfast Zada Finders, MD   112 mcg at 06/08/16 0830  . metoprolol succinate (TOPROL-XL) 24 hr tablet 25 mg  25 mg Oral Daily Zada Finders, MD   25 mg at 06/08/16 9381  . multivitamin (RENA-VIT) tablet 1 tablet  1 tablet Oral Daily Zada Finders, MD      . oxyCODONE-acetaminophen (PERCOCET/ROXICET) 5-325 MG per tablet 1-2 tablet  1-2 tablet Oral Q4H Bethany Molt, DO   2 tablet at 06/08/16 0829  . pravastatin (PRAVACHOL) tablet 20 mg  20 mg Oral  [id number] Zada Finders, MD       Labs: Basic Metabolic Panel:  Recent Labs Lab 06/07/16 1816 06/08/16 0511  NA 140 140  K 4.6 4.7  CL 98* 99*  CO2 24 27  GLUCOSE 109* 126*  BUN 24* 31*  CREATININE 4.79* 5.41*  CALCIUM 9.3 9.1  PHOS  --  7.9*   Liver Function Tests:  Recent Labs Lab 06/08/16 0511  ALBUMIN 2.8*   No results for input(s): LIPASE, AMYLASE in the last 168 hours. No results for input(s): AMMONIA in the last 168 hours. CBC:  Recent Labs Lab 06/07/16 1816 06/08/16 0511  WBC 7.1 7.4  NEUTROABS 5.6  --   HGB 10.5* 10.0*  HCT 32.3* 31.3*  MCV 97.3 97.8  PLT 184 156   Cardiac Enzymes: No results for input(s): CKTOTAL, CKMB, CKMBINDEX, TROPONINI in the last 168 hours. CBG: No results for input(s): GLUCAP in the last 168 hours. Iron Studies: No results for input(s): IRON, TIBC, TRANSFERRIN, FERRITIN in the last 72 hours. Studies/Results: Dg Knee 1-2 Views Left  Result Date: 06/07/2016 CLINICAL DATA:  Fall 3 hours ago.  Prior amputation. EXAM: LEFT KNEE - 1-2 VIEW COMPARISON:  02/11/2010 FINDINGS: Status post below-the-knee amputation. Extensive vascular calcifications. Suboptimal ans atypical patient positioning, especially on the second image. Osteopenia. Fracture of the distal femoral metaphysis with probable impaction and angulation. This is suboptimally evaluated on the AP images secondary to positioning and altered anatomy. No dislocation. IMPRESSION: Distal femoral fracture, suboptimally evaluated secondary to chronic  deformities and suboptimal positioning. Favored to be acute or subacute. CT could be informative to help delineate. Vascular calcifications. Electronically Signed   By: Abigail Miyamoto M.D.   On: 06/07/2016 17:31   Ct Pelvis Wo Contrast  Result Date: 06/07/2016 CLINICAL DATA:  Left hip pain after fall earlier today. EXAM: CT PELVIS WITHOUT CONTRAST TECHNIQUE: Multidetector CT imaging of the pelvis was performed following the standard protocol without intravenous contrast. COMPARISON:  Plain films of the pelvis of 11/13/2009. Prior CT of 04/29/2005. FINDINGS: Soft tissues: Degradation secondary to beam hardening artifact from left hip arthroplasty. Scattered colonic diverticula. Mild motion degradation throughout. Normal terminal ileum and appendix. Normal small bowel caliber. Advanced distal aortic and pelvic atherosclerosis. No pelvic sidewall adenopathy. Probable hysterectomy. Limited evaluation the adnexa. No free intraperitoneal air. No free fluid. Bilateral fat containing inguinal hernias. Fat containing lateral pelvic wall hernia including on image 67/series 202. Musculoskeletal: Pelvic anasarca, without well-defined hematoma. Soft tissue fullness, heterogeneity, and gas within the right psoas muscle including on the order of 4.0 x 4.2 cm on image 26/ series 202. This is new since the CT of 2006. Also likely new since the abdominal CT of 2016. Osteopenia. Left hip arthroplasty. 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 Degenerative sclerosis of the bilateral sacroiliac joints. A moderate compression deformity involves the L4 vertebral body. There is gas within the vertebral body, including on sagittal image 84/series 206. Findings are new since 09/28/2014 abdominal study. Mild ventral canal encroachment. Advanced lumbosacral spondylosis. IMPRESSION: 1. Left hip arthroplasty, without evidence of complicating fracture or dislocation. Please note given the extent of beam hardening artifact from arthroplasty and  osteopenia, CT is relatively low sensitivity for acute fracture and the test of choice is pelvic MRI. 2. New L4 compression deformity since 2016. Concurrent gas within the vertebral body as well as gas and soft tissue fullness within the right psoas muscle. Findings are overall suspicious for osteomyelitis and discitis with extension into the right psoas muscle representing infected  hematoma or abscess. 3.  Possible constipation. These results were called by telephone at the time of interpretation on 06/07/2016 at 5:41 pm to Dr. Orlie Dakin , who verbally acknowledged these results. Electronically Signed   By: Abigail Miyamoto M.D.   On: 06/07/2016 17:45   Mr Lumbar Spine Wo Contrast  Result Date: 06/07/2016 CLINICAL DATA:  Status post fall.  Back pain. EXAM: MRI LUMBAR SPINE WITHOUT CONTRAST TECHNIQUE: Multiplanar, multisequence MR imaging of the lumbar spine was performed. No intravenous contrast was administered. COMPARISON:  CT abdomen and pelvis 06/07/2016. FINDINGS: Segmentation:  Standard. Alignment:  Physiologic. Vertebrae: There is an acute compression fracture of L4, significant superior endplate depression, diffuse bone marrow edema, no involvement of the pedicles. 3 mm retropulsed fragment. Conus medullaris: Extends to the L1 level and appears normal. Paraspinal and other soft tissues: There is T1 hypointense, T2 hyperintense heterogeneous fluid collection enlarging the RIGHT psoas muscle. Representative cross-section as seen on axial image 26 of 36 x 40 mm. Small T1 and T2 hypointense bubbles of air can be seen within. Psoas abscess is suspected. Disc levels: The L1-2, and L2-3 disc spaces are normal. At L3-4 the disc is unremarkable but there is mild stenosis related to the retropulsed bone and posterior element hypertrophy just below the interspace. At L4-5 there is mild stenosis related to posterior element hypertrophy. At L5-S1 there is advanced disc space narrowing. Osseous ridging with annular  bulging is observed. 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 Tissue sampling is warranted. Acute L4 compression fracture, without evidence for discitis. 3 mm retropulsed bone. Given the air which is within the L4 vertebral body as seen on CT, L4 osteomyelitis is not excluded. Electronically Signed   By: Staci Righter M.D.   On: 06/07/2016 21:29   Mr Pelvis Wo Contrast  Result Date: 06/07/2016 CLINICAL DATA:  Pain in the left hip and right knee. Changes suggesting discitis/ osteomyelitis seen on CT. EXAM: MRI PELVIS WITHOUT CONTRAST TECHNIQUE: Multiplanar multisequence MR imaging of the pelvis was performed. No intravenous contrast was administered. COMPARISON:  MRI lumbar spine 06/07/2016.  CT pelvis 06/07/2016. FINDINGS: The examination is technically limited due to prominent susceptibility artifact arising from left hip arthroplasty and due to motion artifact as well as wraparound artifact. Urinary Tract: Bladder wall is not thickened and no filling defects are demonstrated. No distal ureteral dilatation. Bowel: Visualized portions of small and large bowel are not abnormally distended. Vascular/Lymphatic: No significant lymphadenopathy in the pelvis. Visualized iliac and femoral arteries and veins are normal in caliber with normal flow voids demonstrated. Reproductive: Uterus appears surgically absent. Ovaries are not specifically demonstrated but no abnormal adnexal masses are seen. Other: Small fat containing stick daily in hernia on the left. Small right inguinal hernia containing fat. Musculoskeletal: Diffuse muscular atrophy involving the visualized pelvic musculature. Compression deformity and increased T2 signal intensity demonstrated within the L4 vertebra. This is more completely demonstrated on the MRI lumbar spine and CT. Overall appearance is worrisome for osteomyelitis. See additional reports. Diffuse increased T2 signal intensity and expansion of the right iliopsoas muscle with  focal circumscribed collection measuring 1.8 x 4.6 cm in diameter. There is Less prominent T2 signal intensity in the left psoas muscle. Findings are consistent with inflammatory change and likely represents psoas abscess. Postoperative left hip arthroplasty. Susceptibility artifact arising from the metallic hardware precludes evaluation of the left hip arthroplasty and surrounding bone structures. There is increased T2 signal intensity in the soft tissues around the visualized  midshaft of the femur, extending below the field of view. Given the finding of distal left femoral fracture on plain films of the knee, this likely represents hematoma or edema related to the fractures. Additional femoral shaft fractures are not excluded. Bone marrow signal intensities in the visualized pelvis, sacrum, and right hip are unremarkable without evidence of fracture or contusion. IMPRESSION: 1. Abscess and inflammatory changes demonstrated in the right ileus psoas muscle and less prominent in the left psoas muscle. Compression of the L4 vertebra possibly indicating osteomyelitis. See additional reports of CT and lumbar spine MR from earlier today. 2. Large amount of susceptibility artifact arises from left hip arthroplasty precluding evaluation of the left hip for tractor or dislocation. There is soft tissue edema in the musculature around the visualized midshaft femur, probably arising from the known distal femoral fracture. Otherwise, no marrow changes to suggest pelvic, sacral, or right hip fractures. 3. Incidental finding of small left stick galea and and right inguinal hernias containing fat. Electronically Signed   By: Lucienne Capers M.D.   On: 06/07/2016 23:01    ROS: As per HPI otherwise negative.  Physical Exam: Vitals:   06/07/16 2300 06/07/16 2330 06/08/16 0053 06/08/16 0532  BP: (!) 135/48 147/62 (!) 116/58 (!) 113/58  Pulse: 85 88 85 77  Resp:      Temp:   97.8 F (36.6 C) 97.8 F (36.6 C)  TempSrc:    Oral Oral  SpO2: 97% 97% 100% 100%  Weight:      Height:         General: Ill-appearing female, NAD Head: NCAT sclera not icteric MMM Neck: Supple. No JVD Lungs: CTA bilaterally without wheezes, rales, or rhonchi. Breathing is unlabored. Heart: RRR with S1 S2.  Abdomen: soft NT + BS  Lower extremities: L BKA with mild swelling, R LE brusies to knee no edema Neuro: A & O  X 3. Moves all extremities spontaneously. Psych:  Responds to questions appropriately with a normal affect. Dialysis Access: RUE AVF +thrill/bruit   Dialysis Orders:  AF MWF  3h 23mn BFR 400 2k/2/25 Ca Linear Na EDW 81 kg Heparin 8500 U IV bolus Hectorol 2 mcg IV 3x q week No ESA OP Labs: P 6.5 Ca 9.4 PTH 279   Assessment/Plan: 1.  L distal femur fracture, L4 compression fracture  s/p fall from wheelchair - per admit- ortho/neurosurgy consulted  2. R psoas abscess - per admit - IR to drain today - no abx yet  3.  ESRD -  HD MWF - cont on schedule K+4.7 4.  Hypertension/volume  - BP controlled on metoprolol /no volume excess on exam, meeting EDW  5.  Anemia  - Hgb 10.0 - follow with possible surgery - no OP ESA  6.  Metabolic bone disease -  OP Ca/PTH at goal - no binder on med list - will add Fosrenol for ^P 7.  Nutrition - renal diet/vitamins  8. Hypothyroidism - on Synthroid  9. Depression   Oge[place] GLarina EarthlyPA-C [facility] Pager [phone]/14/2018, 11:35 AM   Pt seen, examined and agree w A/P as above.  RKelly SplinterMD CNewell Rubbermaidpager [phone]  06/08/2016, 5:20 PM

## 2016-06-08 NOTE — ED Notes (Signed)
Attempted report, 5N did not answer phone

## 2016-06-08 NOTE — Sedation Documentation (Signed)
Patient is resting comfortably. 

## 2016-06-08 NOTE — Sedation Documentation (Signed)
Pt is arousable, no complaints at this time.

## 2016-06-08 NOTE — Procedures (Signed)
Technically successful CT guided aspiration of approximately 3 cc of thick, bloody fluid from dominant component of left sided paraspinal fluid collection. All aspirated samples sent to the laboratory for analysis.   EBL: None No immediate post procedural complications.   Katherina RightJay Shenice Dolder, MD Pager #: [phone]

## 2016-06-08 NOTE — Progress Notes (Signed)
   Subjective:   No acute events overnight.  Pt continues to complain of pain in left lower extremity. Has weakness of right leg. She does not bear weight at baseline  and uses wheelchair to get around. We increased the norco.  Objective:  Vital signs in last 24 hours: Vitals:   06/07/16 2300 06/07/16 2330 06/08/16 0053 06/08/16 0532  BP: (!) 135/48 147/62 (!) 116/58 (!) 113/58  Pulse: 85 88 85 77  Resp:      Temp:   97.8 F (36.6 C) 97.8 F (36.6 C)  TempSrc:   Oral Oral  SpO2: 97% 97% 100% 100%  Weight:      Height:       General: Vital signs reviewed. Patient in no acute distress Cardiovascular: regular rate, rhythm, no murmur appreciated  Pulmonary/Chest: Clear to auscultation bilaterally, no wheezes, rales, or rhonchi. Abdominal: Soft, non-tender, non-distended, BS + Extremities: Left BKA, tenderness with light touch to left distal femur, some right lower extremity tenderness. Able to flex and extend right leg.  Neuro: A&O x 3    Assessment/Plan:  Active Problems:   ESRD (end stage renal disease) on dialysis (HCC)   Hypertension   Refusal of blood transfusions as patient is Jehovah's Witness   Compression fracture of L4 lumbar vertebra (HCC)   Closed left femoral fracture (HCC)   Psoas abscess (HCC)  Left distal femur fracture (fragility), L4 compression fracture vs possible osteomyelitis:  Pt presents after mechanical fall with left knee pain and back pain. CT pelvis showed left distal femur fracture and L4 compression fracture vs osteomyelitis. Orthopedic consulted, as well as neurosurgery.  They also found a right psoas abscess for which IR has been consulted and pt will undergo image guided drainage.  Pt is also Jehovas witness. She does not seem systemically ill butshe is at risk for transient bacteremia given hemodialysis. . She is not immunosuppressed. May need bone biopsy to distinguish osteo from compression fractures.  -consulted orthopedics -consulted  neurosurgery -Norco 1-2 tabs q4 hours -dilaudid 1 mg q3 hours PRn -follow blood cultures  Right psoas abscess: Found on imaging. IR consulted and will do drainage. We will continue to hold the antibiotics to increase the yield of cultures. She is afebrile and has no leukocytosis.  Also ordered HIV and Hepatitis C  -follow cultures once obtained -trend CBC -low threshold to start abx  ESRD MWF:  Will call renal  -dialysis tomorrow   Dispo: Anticipated discharge in approximately 2 day(s).   Deneise LeverParth Temeka [name], MD 06/08/2016, 11:57 AM

## 2016-06-08 NOTE — H&P (Signed)
Date: 06/08/2016               Patient Name:  [name] MRN: [id number]  DOB: Jun 02, 1945 Age / Sex: 71 y.o., female   PCP: Janifer Adie, MD         Medical Service: Internal Medicine Teaching Service         Attending Physician: Dr. Axel Filler, MD    First Contact: Dr. Asencion Partridge Pager: [phone]  Second Contact: Dr. Burgess Estelle Pager: [phone]       After Hours (After 5p/  First Contact Pager: [phone]  weekends / holidays): Second Contact Pager: [phone]  Fall Chief Complaint: Fall  History of Present Illness: The patient is a 71 year old female with a past medical history of end-stage renal disease on hemodialysis (M,W,F), hypertension, diabetes mellitus type 2, HFrEF (EF:35-40% ), Peripheral arterial disease status post left BKA, coronary artery disease status post MI 2, status post stent 4, hyperlipidemia and pulmonary hypertension who presents from a facility after a fall. The patient states that she was transferring from the toilet to her wheelchair when she slipped and landed on her left side. She denied head trauma. She did not lose consciousness. She denies dizziness or feeling of lightheadedness. This fall seems to be mechanical in nature as she slipped when moving from the toilet to her wheelchair. She is endorsing severe left superior knee pain and back pain. She denies chest pain or shortness of breath. She denies nausea, vomiting or abdominal pain. She denies diarrhea or constipation. She denies fevers, chills or night sweats. She has no additional acute complaints or concerns today.  In the emergency department the patient was afebrile, hypertensive and hemodynamically stable. Labs were significant for a creatinine of 4.79 and hemoglobin of 10.5. No leukocytosis. The patient had an extensive imaging evaluation which demonstrated the following: Left knee two view diagnostic radiograph-distal femoral fracture, CT pelvis without contrast-new L4  compression deformity since 2016 with concurrent gas within the vertebral body as well as gas and soft tissue fullness within the right psoas muscle, MR lumbar spine without contrast-suspected right psoas abscess, acute L4 compression fracture without evidence of discitis, MRI pelvis without contrast-abscess and inflammatory changes demonstrated in the right iliopsoas muscle and compression of the L4 vertebra.  In the emergency Department orthopedic surgery was called regarding the patient's distal femoral fracture. Orthopedic surgery requested neurosurgery evaluation secondary to compression fracture. Neurosurgery was called and requests interventional radiology drainage of potential abscess for culture. Orthopedic surgery and neurosurgery will see the patient tomorrow. Speaking with the ED provider neurosurgery will need to be officially consulted again in the morning by internal medicine. The patient was then admitted to the [facility] internal medicine teaching service for further workup and management.  Meds:  No outpatient prescriptions have been marked as taking for the 06/07/16 encounter [facility] Encounter).     Allergies: Allergies as of 06/07/2016 - Review Complete 06/07/2016  Allergen Reaction Noted  . Other Anaphylaxis 04/08/2011  . Pumpkin seed oil-saw palmetto-zinc [propalmex] Anaphylaxis 04/08/2011  . Shrimp [shellfish allergy] Anaphylaxis 04/08/2011  . Morphine and related Other (See Comments) 06/07/2016  . Penicillins  04/08/2011   Past Medical History:  Diagnosis Date  . Anxiety   . Asthma   . CAD (coronary artery disease)    s/p MI x 2, s/p stent x 4 (05/2005 with DES placed at that time)  . Carpal tunnel syndrome, bilateral   . Chronic systolic congestive heart failure (  [place])    EF 35-40% per 2D echo (03/2011)  . Constipation   . Diabetes mellitus type 2, controlled, with complications ([place])    diet controlled  . Diabetic retinopathy   . ESRD (end stage renal  disease) on dialysis [facility]) HD since 07/2008   [facility]; Mon, [place], Friday  . Fibromyalgia   . Hemorrhoids   . Hyperlipidemia   . Hypertension   . Hypothyroid   . Major depression   . Migraines   . Morphea   . Normocytic anemia    BL Hgb 10-12  //  Chronic, likely multifactorial AOCD and possible iron deficiency component with transferrin sat < 20 historically.  . Osteoporosis   . PAD (peripheral artery disease) ([facility])    s/p L BKA  . Peripheral neuropathy ([place])   . PONV (postoperative nausea and vomiting)   . Pulmonary hypertension    PA Peak pressure 57 mmHg per 2D echo (03/2011)  . Refusal of blood transfusions as patient is Jehovah's Witness   . Secondary hyperparathyroidism ([place])   . Vitamin D deficiency     Family History:  Family History  Problem Relation Age of Onset  . Alcohol abuse Father   . Multiple myeloma Mother   . Diabetes type II Mother   . Hypertension Mother   . Cancer Mother   . Diabetes Mother   . Appendicitis Brother      Social History: Denies current tobacco, alcohol or illicit drug use  Review of Systems: A complete ROS was negative except as per HPI.   Physical Exam: Blood pressure 147/62, pulse 88, temperature 98.4 F (36.9 C), temperature source Oral, resp. rate 16, height 5' 8"  (1.727 m), weight 178 lb (80.7 kg), SpO2 97 %. Physical Exam  Constitutional: She is oriented to person, place, and time. She appears well-developed and well-nourished.  Chronically ill-appearing  HENT:  Head: Normocephalic and atraumatic.  Eyes: Pupils are equal, round, and reactive to light.  Cardiovascular: Normal rate and regular rhythm.  Exam reveals no gallop and no friction rub.   No murmur heard. Respiratory: Effort normal and breath sounds normal. No respiratory distress. She has no wheezes.  GI: Soft. Bowel sounds are normal. She exhibits no distension. There is no tenderness.  Musculoskeletal:  Left BKA, tenderness with light touch to the left  distal femur, right prepatellar ecchymosis.  Neurological: She is alert and oriented to person, place, and time.     EKG: Not performed  CXR: Not performed  Assessment & Plan by Problem: Active Problems:   Compression fracture of L4 lumbar vertebra [facility])  The patient is a 71 year old female with multiple medical comorbidities who presents after a fall with a distal femur fracture, L4 compression fracture and a right iliopsoas abscess.  # Left distal femur fracture, L4 compression fracture The patient presents after mechanical fall with left knee pain and back pain. Imaging evaluation demonstrated a left distal femur fracture and an L4 compression fracture. Orthopedic surgery has been consulted and will see the patient tomorrow. Neurosurgery has been consulted and have recommended IR drainage of the abscess (see below). Neurosurgery will need to be officially reconsulted in the morning. For now, we will treat the patient's pain overnight. She will also be made nothing by mouth at midnight for potential operation tomorrow. -- Oxycodone-acetaminophen, 1-2 tablets every 6 hours as needed -- Hydromorphone 1 mg every 3 hours as needed for severe pain -- Nothing by mouth at midnight -- Importantly the patient is [employer]  Witness and does not wish to have any blood products  # Right psoas abscess Incidentally, a right psoas abscess was found during diagnostic imaging workup for the patient's fall. Currently, the patient is afebrile, hemodynamically stable and does not have a leukocytosis. She does not meet sepsis criteria and does not appear grossly infected. We will not start antibiotics at this time but will wait for IR guided drainage and culture data to guide proper antimicrobial therapy. -- Consult interventional radiology in the morning -- Cultures of fluid  # End stage renal disease on hemodialysis Continue his normal dialysis schedule. -- Dialysis M, W, F  # HFrEF ## HTN ## CAD s/p  stent The patient has a history of hypertension, heart failure with reduced ejection fraction and coronary artery disease status post stent. Most recent echocardiogram demonstrated a left ventricular ejection fraction of 35-40 percent. -- Metoprolol extended release 25 mg once daily -- Pravastatin 20 mg once daily -- Aspirin 81 mg once daily  # Hypothyroidism -- Levothyroxine 112 mcg daily  FEN/GI: Nothing by mouth DVT/PE prophylaxis: Heparin Code: DO NOT RESUSCITATE Special consideration: Jehovah's Witness, does not wish to have blood products  Dispo: Admit patient to Inpatient with expected length of stay greater than 2 midnights.  Signed: Ophelia Shoulder, MD 06/08/2016, 12:30 AM  Pager: [phone]

## 2016-06-08 NOTE — Sedation Documentation (Signed)
Pt does not have complaints at this time. Procedure started, MD aware of vital signs

## 2016-06-08 NOTE — Consult Note (Signed)
Reason for Consult: Left leg pain Referring Physician: Dr Zara Council is an 71 y.o. female.  HPI: [name] is a 71 year old female who had a fall yesterday.  She was evaluated in the emergency room and noted have a distal femur fracture on the left around a BKA.  She does not walk on that side and does not use the prosthesis.  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  Neurosurgery has been consult did.  Interventional radiology to drain the abscess today.  Patient transfers but does not weight-bear on the left side.  She is typically in a wheelchair.  Past Medical History:  Diagnosis Date  . Anxiety   . Asthma   . CAD (coronary artery disease)    s/p MI x 2, s/p stent x 4 (05/2005 with DES placed at that time)  . Carpal tunnel syndrome, bilateral   . Chronic systolic congestive heart failure ([place])    EF 35-40% per 2D echo (03/2011)  . Constipation   . Diabetes mellitus type 2, controlled, with complications ([place])    diet controlled  . Diabetic retinopathy   . ESRD (end stage renal disease) on dialysis [facility]) HD since 07/2008   [facility]; Mon, [place], Friday  . Fibromyalgia   . Hemorrhoids   . Hyperlipidemia   . Hypertension   . Hypothyroid   . Major depression   . Migraines   . Morphea   . Normocytic anemia    BL Hgb 10-12  //  Chronic, likely multifactorial AOCD and possible iron deficiency component with transferrin sat < 20 historically.  . Osteoporosis   . PAD (peripheral artery disease) ([facility])    s/p L BKA  . Peripheral neuropathy ([place])   . PONV (postoperative nausea and vomiting)   . Pulmonary hypertension    PA Peak pressure 57 mmHg per 2D echo (03/2011)  . Refusal of blood transfusions as patient is Jehovah's Witness   . Secondary hyperparathyroidism ([place])   . Vitamin D deficiency     Past Surgical History:  Procedure Laterality Date  . AV FISTULA PLACEMENT  ~ 2011   right upper arm  . BYPASS GRAFT  2006   . CARDIAC CATHETERIZATION  2006, 2007   pt had 4 stents placed  . CATARACT EXTRACTION, BILATERAL Bilateral 2000  . CHOLECYSTECTOMY  1987  . HEMIARTHROPLASTY HIP Left 10/2009  . JOINT REPLACEMENT  2007   Partial placement Left Hip  . LEG AMPUTATION BELOW KNEE Left 10/2008   left  . PERIPHERAL ARTERIAL STENT GRAFT Bilateral    bilaterally "(left ~ 2010; right ~ 2011"  . VAGINAL HYSTERECTOMY  1994   with BSO    Family History  Problem Relation Age of Onset  . Alcohol abuse Father   . Multiple myeloma Mother   . Diabetes type II Mother   . Hypertension Mother   . Cancer Mother   . Diabetes Mother   . Appendicitis Brother     Social History:  reports that she quit smoking about 27 years ago. Her smoking use included Cigarettes. She has a 60.00 pack-year smoking history. She has never used smokeless tobacco. She reports that she does not drink alcohol or use drugs.  Allergies:  Allergies  Allergen Reactions  . Other Anaphylaxis    peaches  . Pumpkin Seed Oil-Saw Palmetto-Zinc [Propalmex] Anaphylaxis  . Shrimp [Shellfish Allergy] Anaphylaxis  . Morphine And Related Other (See Comments)    Pt fell  into deep sleep and could not be woken up had to be resuscitated says she did not overdose, unsure whether throat swelled or SOB pt does not remember  . Penicillins     Tongue swelling.     Medications: I have reviewed the patient's current medications.  Results for orders placed or performed during the hospital encounter of 06/07/16 (from the past 48 hour(s))  Basic metabolic panel     Status: Abnormal   Collection Time: 06/07/16  6:16 PM  Result Value Ref Range   Sodium 140 135 - 145 mmol/L   Potassium 4.6 3.5 - 5.1 mmol/L   Chloride 98 (L) 101 - 111 mmol/L   CO2 24 22 - 32 mmol/L   Glucose, Bld 109 (H) 65 - 99 mg/dL   BUN 24 (H) 6 - 20 mg/dL   Creatinine, Ser 4.79 (H) 0.44 - 1.00 mg/dL   Calcium 9.3 8.9 - 10.3 mg/dL   GFR calc non Af Amer 9 (L) >60 mL/min   GFR calc Af  Amer 10 (L) >60 mL/min    Comment: (NOTE) The eGFR has been calculated using the CKD EPI equation. This calculation has not been validated in all clinical situations. eGFR's persistently <60 mL/min signify possible Chronic Kidney Disease.    Anion gap 18 (H) 5 - 15  CBC with Differential/Platelet     Status: Abnormal   Collection Time: 06/07/16  6:16 PM  Result Value Ref Range   WBC 7.1 4.0 - 10.5 K/uL   RBC 3.32 (L) 3.87 - 5.11 MIL/uL   Hemoglobin 10.5 (L) 12.0 - 15.0 g/dL   HCT 32.3 (L) 36.0 - 46.0 %   MCV 97.3 78.0 - 100.0 fL   MCH 31.6 26.0 - 34.0 pg   MCHC 32.5 30.0 - 36.0 g/dL   RDW 14.7 11.5 - 15.5 %   Platelets 184 150 - 400 K/uL   Neutrophils Relative % 80 %   Neutro Abs 5.6 1.7 - 7.7 K/uL   Lymphocytes Relative 11 %   Lymphs Abs 0.8 0.7 - 4.0 K/uL   Monocytes Relative 5 %   Monocytes Absolute 0.4 0.1 - 1.0 K/uL   Eosinophils Relative 4 %   Eosinophils Absolute 0.3 0.0 - 0.7 K/uL   Basophils Relative 0 %   Basophils Absolute 0.0 0.0 - 0.1 K/uL  No blood products     Status: None   Collection Time: 06/07/16 11:53 PM  Result Value Ref Range   Transfuse no blood products      TRANSFUSE NO BLOOD PRODUCTS, VERIFIED BY Lianne Bushy  Surgical PCR screen     Status: Abnormal   Collection Time: 06/08/16  1:19 AM  Result Value Ref Range   MRSA, PCR POSITIVE (A) NEGATIVE    Comment: RESULT CALLED TO, READ BACK BY AND VERIFIED WITH:  TO N(ANDERSON) BY TCLEVELAND 06/08/2016 AT 0439    Staphylococcus aureus POSITIVE (A) NEGATIVE    Comment:        The Xpert SA Assay (FDA approved for NASAL specimens in patients over 58 years of age), is one component of a comprehensive surveillance program.  Test performance has been validated by [facility] for patients greater than or equal to 52 year old. It is not intended to diagnose infection nor to guide or monitor treatment.   CBC     Status: Abnormal   Collection Time: 06/08/16  5:11 AM  Result Value Ref Range    WBC 7.4 4.0 - 10.5 K/uL  RBC 3.20 (L) 3.87 - 5.11 MIL/uL   Hemoglobin 10.0 (L) 12.0 - 15.0 g/dL   HCT 31.3 (L) 36.0 - 46.0 %   MCV 97.8 78.0 - 100.0 fL   MCH 31.3 26.0 - 34.0 pg   MCHC 31.9 30.0 - 36.0 g/dL   RDW 15.0 11.5 - 15.5 %   Platelets 156 150 - 400 K/uL  Renal function panel     Status: Abnormal   Collection Time: 06/08/16  5:11 AM  Result Value Ref Range   Sodium 140 135 - 145 mmol/L   Potassium 4.7 3.5 - 5.1 mmol/L   Chloride 99 (L) 101 - 111 mmol/L   CO2 27 22 - 32 mmol/L   Glucose, Bld 126 (H) 65 - 99 mg/dL   BUN 31 (H) 6 - 20 mg/dL   Creatinine, Ser 5.41 (H) 0.44 - 1.00 mg/dL   Calcium 9.1 8.9 - 10.3 mg/dL   Phosphorus 7.9 (H) 2.5 - 4.6 mg/dL   Albumin 2.8 (L) 3.5 - 5.0 g/dL   GFR calc non Af Amer 7 (L) >60 mL/min   GFR calc Af Amer 9 (L) >60 mL/min    Comment: (NOTE) The eGFR has been calculated using the CKD EPI equation. This calculation has not been validated in all clinical situations. eGFR's persistently <60 mL/min signify possible Chronic Kidney Disease.    Anion gap 14 5 - 15  APTT     Status: None   Collection Time: 06/08/16  9:24 AM  Result Value Ref Range   aPTT 32 24 - 36 seconds  Protime-INR     Status: None   Collection Time: 06/08/16  9:24 AM  Result Value Ref Range   Prothrombin Time 13.7 11.4 - 15.2 seconds   INR 1.05     Dg Knee 1-2 Views Left  Result Date: 06/07/2016 CLINICAL DATA:  Fall 3 hours ago.  Prior amputation. EXAM: LEFT KNEE - 1-2 VIEW COMPARISON:  02/11/2010 FINDINGS: Status post below-the-knee amputation. Extensive vascular calcifications. Suboptimal ans atypical patient positioning, especially on the second image. Osteopenia. Fracture of the distal femoral metaphysis with probable impaction and angulation. This is suboptimally evaluated on the AP images secondary to positioning and altered anatomy. No dislocation. IMPRESSION: Distal femoral fracture, suboptimally evaluated secondary to chronic deformities and suboptimal  positioning. Favored to be acute or subacute. CT could be informative to help delineate. Vascular calcifications. Electronically Signed   By: Abigail Miyamoto M.D.   On: 06/07/2016 17:31   Ct Pelvis Wo Contrast  Result Date: 06/07/2016 CLINICAL DATA:  Left hip pain after fall earlier today. EXAM: CT PELVIS WITHOUT CONTRAST TECHNIQUE: Multidetector CT imaging of the pelvis was performed following the standard protocol without intravenous contrast. COMPARISON:  Plain films of the pelvis of 11/13/2009. Prior CT of 04/29/2005. FINDINGS: Soft tissues: Degradation secondary to beam hardening artifact from left hip arthroplasty. Scattered colonic diverticula. Mild motion degradation throughout. Normal terminal ileum and appendix. Normal small bowel caliber. Advanced distal aortic and pelvic atherosclerosis. No pelvic sidewall adenopathy. Probable hysterectomy. Limited evaluation the adnexa. No free intraperitoneal air. No free fluid. Bilateral fat containing inguinal hernias. Fat containing lateral pelvic wall hernia including on image 67/series 202. Musculoskeletal: Pelvic anasarca, without well-defined hematoma. Soft tissue fullness, heterogeneity, and gas within the right psoas muscle including on the order of 4.0 x 4.2 cm on image 26/ series 202. This is new since the CT of 2006. Also likely new since the abdominal CT of 2016. Osteopenia. Left hip arthroplasty. 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 Degenerative sclerosis of the bilateral sacroiliac joints. A moderate compression deformity involves the L4 vertebral body. There is gas within the vertebral body, including on sagittal image 84/series 206. Findings are new since 09/28/2014 abdominal study. Mild ventral canal encroachment. Advanced lumbosacral spondylosis. IMPRESSION: 1. Left hip arthroplasty, without evidence of complicating fracture or dislocation. Please note given the extent of beam hardening artifact from arthroplasty and osteopenia, CT is relatively low  sensitivity for acute fracture and the test of choice is pelvic MRI. 2. New L4 compression deformity since 2016. Concurrent gas within the vertebral body as well as gas and soft tissue fullness within the right psoas muscle. Findings are overall suspicious for osteomyelitis and discitis with extension into the right psoas muscle representing infected hematoma or abscess. 3.  Possible constipation. These results were called by telephone at the time of interpretation on 06/07/2016 at 5:41 pm to Dr. Orlie Dakin , who verbally acknowledged these results. Electronically Signed   By: Abigail Miyamoto M.D.   On: 06/07/2016 17:45   Mr Lumbar Spine Wo Contrast  Result Date: 06/07/2016 CLINICAL DATA:  Status post fall.  Back pain. EXAM: MRI LUMBAR SPINE WITHOUT CONTRAST TECHNIQUE: Multiplanar, multisequence MR imaging of the lumbar spine was performed. No intravenous contrast was administered. COMPARISON:  CT abdomen and pelvis 06/07/2016. FINDINGS: Segmentation:  Standard. Alignment:  Physiologic. Vertebrae: There is an acute compression fracture of L4, significant superior endplate depression, diffuse bone marrow edema, no involvement of the pedicles. 3 mm retropulsed fragment. Conus medullaris: Extends to the L1 level and appears normal. Paraspinal and other soft tissues: There is T1 hypointense, T2 hyperintense heterogeneous fluid collection enlarging the RIGHT psoas muscle. Representative cross-section as seen on axial image 26 of 36 x 40 mm. Small T1 and T2 hypointense bubbles of air can be seen within. Psoas abscess is suspected. Disc levels: The L1-2, and L2-3 disc spaces are normal. At L3-4 the disc is unremarkable but there is mild stenosis related to the retropulsed bone and posterior element hypertrophy just below the interspace. At L4-5 there is mild stenosis related to posterior element hypertrophy. At L5-S1 there is advanced disc space narrowing. Osseous ridging with annular bulging is observed. 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 Tissue sampling is warranted. Acute L4 compression fracture, without evidence for discitis. 3 mm retropulsed bone. Given the air which is within the L4 vertebral body as seen on CT, L4 osteomyelitis is not excluded. Electronically Signed   By: Staci Righter M.D.   On: 06/07/2016 21:29   Mr Pelvis Wo Contrast  Result Date: 06/07/2016 CLINICAL DATA:  Pain in the left hip and right knee. Changes suggesting discitis/ osteomyelitis seen on CT. EXAM: MRI PELVIS WITHOUT CONTRAST TECHNIQUE: Multiplanar multisequence MR imaging of the pelvis was performed. No intravenous contrast was administered. COMPARISON:  MRI lumbar spine 06/07/2016.  CT pelvis 06/07/2016. FINDINGS: The examination is technically limited due to prominent susceptibility artifact arising from left hip arthroplasty and due to motion artifact as well as wraparound artifact. Urinary Tract: Bladder wall is not thickened and no filling defects are demonstrated. No distal ureteral dilatation. Bowel: Visualized portions of small and large bowel are not abnormally distended. Vascular/Lymphatic: No significant lymphadenopathy in the pelvis. Visualized iliac and femoral arteries and veins are normal in caliber with normal flow voids demonstrated. Reproductive: Uterus appears surgically absent. Ovaries are not specifically demonstrated but no abnormal adnexal masses are seen. Other: Small fat containing stick daily in hernia on the  left. Small right inguinal hernia containing fat. Musculoskeletal: Diffuse muscular atrophy involving the visualized pelvic musculature. Compression deformity and increased T2 signal intensity demonstrated within the L4 vertebra. This is more completely demonstrated on the MRI lumbar spine and CT. Overall appearance is worrisome for osteomyelitis. See additional reports. Diffuse increased T2 signal intensity and expansion of the right iliopsoas muscle with focal circumscribed collection  measuring 1.8 x 4.6 cm in diameter. There is Less prominent T2 signal intensity in the left psoas muscle. Findings are consistent with inflammatory change and likely represents psoas abscess. Postoperative left hip arthroplasty. Susceptibility artifact arising from the metallic hardware precludes evaluation of the left hip arthroplasty and surrounding bone structures. There is increased T2 signal intensity in the soft tissues around the visualized midshaft of the femur, extending below the field of view. Given the finding of distal left femoral fracture on plain films of the knee, this likely represents hematoma or edema related to the fractures. Additional femoral shaft fractures are not excluded. Bone marrow signal intensities in the visualized pelvis, sacrum, and right hip are unremarkable without evidence of fracture or contusion. IMPRESSION: 1. Abscess and inflammatory changes demonstrated in the right ileus psoas muscle and less prominent in the left psoas muscle. Compression of the L4 vertebra possibly indicating osteomyelitis. See additional reports of CT and lumbar spine MR from earlier today. 2. Large amount of susceptibility artifact arises from left hip arthroplasty precluding evaluation of the left hip for tractor or dislocation. There is soft tissue edema in the musculature around the visualized midshaft femur, probably arising from the known distal femoral fracture. Otherwise, no marrow changes to suggest pelvic, sacral, or right hip fractures. 3. Incidental finding of small left stick galea and and right inguinal hernias containing fat. Electronically Signed   By: Lucienne Capers M.D.   On: 06/07/2016 23:01    Review of Systems  Musculoskeletal: Positive for joint pain.  All other systems reviewed and are negative.  Blood pressure (!) 113/58, pulse 77, temperature 97.8 F (36.6 C), temperature source Oral, resp. rate 16, height _0  (1.727 m), weight 178 lb (80.7 kg), SpO2 100 %. Physical  Exam  Constitutional: She appears well-developed.  HENT:  Head: Normocephalic.  Eyes: Pupils are equal, round, and reactive to light.  Neck: Normal range of motion.  Cardiovascular: Normal rate.   Respiratory: Effort normal.  Neurological: She is alert.  Skin: Skin is warm.  Psychiatric: She has a normal mood and affect.   Examination of the left leg demonstrates a flexed DKA region on the left.  Tissue is perfused.  Mild swelling is present but compartments are soft.  There is no groin pain with internal/external rotation of the leg.  Right lower chemise is functional   Assessment/Plan: Impression is left distal femur fracture in a patient who has BKA on that side along with a hip prosthesis.  Currently she has a psoas abscess which is going to be drained by interventional radiology.  Less inclined to place hardware in the distal femur with active infection currently.  Plan is to obtain CT scan of that distal femur to see if there is an intra-articular component.  I may be inclined to try closed reduction and casting to see if that can heal in a straighter position.  Would probably do that sometime later this week.  We'll follow.  Landry Dyke Tesean Stump 06/08/2016, 9:55 AM

## 2016-06-08 NOTE — Consult Note (Signed)
Chief Complaint: Patient was seen in consultation today for right psoas abscess aspiration/drain placement Chief Complaint  Patient presents with  . Fall   at the request of Dr Mendel Corning  Referring Physician(s): Dr Mendel Corning  Supervising Physician: Sandi Mariscal  Patient Status: [facility] - In-pt  History of Present Illness: [name] is a 71 y.o. female   Lives in a [facility] getting from toilet to [facility] Left leg pain---Hx L BKA + new left distal femur fx and L4 compression fx MRI 1/13: IMPRESSION: 1. Abscess and inflammatory changes demonstrated in the right ileus psoas muscle and less prominent in the left psoas muscle. Compression of the L4 vertebra possibly indicating osteomyelitis. See additional reports of CT and lumbar spine MR from earlier today. 2. Large amount of susceptibility artifact arises from left hip arthroplasty precluding evaluation of the left hip for tractor or dislocation. There is soft tissue edema in the musculature around the visualized midshaft femur, probably arising from the known distal femoral fracture. Otherwise, no marrow changes to suggest pelvic, sacral, or right hip fractures. 3. Incidental finding of small left stick galea and and right inguinal hernias containing fat.  Request made for Rt psaos abscess aspiration/drain placement per MD Imaging reviewed by Dr Pascal Lux and approves procedure   Past Medical History:  Diagnosis Date  . Anxiety   . Asthma   . CAD (coronary artery disease)    s/p MI x 2, s/p stent x 4 (05/2005 with DES placed at that time)  . Carpal tunnel syndrome, bilateral   . Chronic systolic congestive heart failure ([place])    EF 35-40% per 2D echo (03/2011)  . Constipation   . Diabetes mellitus type 2, controlled, with complications ([place])    diet controlled  . Diabetic retinopathy   . ESRD (end stage renal disease) on dialysis [facility]) HD since 07/2008   [facility]; Mon, [place], Friday  . Fibromyalgia   . Hemorrhoids     . Hyperlipidemia   . Hypertension   . Hypothyroid   . Major depression   . Migraines   . Morphea   . Normocytic anemia    BL Hgb 10-12  //  Chronic, likely multifactorial AOCD and possible iron deficiency component with transferrin sat < 20 historically.  . Osteoporosis   . PAD (peripheral artery disease) (H[facility])    s/p L BKA  . Peripheral neuropathy ([place])   . PONV (postoperative nausea and vomiting)   . Pulmonary hypertension    PA Peak pressure 57 mmHg per 2D echo (03/2011)  . Refusal of blood transfusions as patient is Jehovah's Witness   . Secondary hyperparathyroidism ([place])   . Vitamin D deficiency     Past Surgical History:  Procedure Laterality Date  . AV FISTULA PLACEMENT  ~ 2011   right upper arm  . BYPASS GRAFT  2006  . CARDIAC CATHETERIZATION  2006, 2007   pt had 4 stents placed  . CATARACT EXTRACTION, BILATERAL Bilateral 2000  . CHOLECYSTECTOMY  1987  . HEMIARTHROPLASTY HIP Left 10/2009  . JOINT REPLACEMENT  2007   Partial placement Left Hip  . LEG AMPUTATION BELOW KNEE Left 10/2008   left  . PERIPHERAL ARTERIAL STENT GRAFT Bilateral    bilaterally "(left ~ 2010; right ~ 2011"  . VAGINAL HYSTERECTOMY  1994   with BSO    Allergies: Other; Pumpkin seed oil-saw palmetto-zinc [propalmex]; Shrimp [shellfish allergy]; Morphine and related; and Penicillins  Medications: Prior to Admission medications  Medication Sig Start Date End Date Taking? Authorizing Provider  ALPRAZolam (XANAX) 0.25 MG tablet Take 0.25 mg by mouth 3 (three) times daily as needed. As needed for anxiety.   Yes Historical Provider, MD  clonazePAM (KLONOPIN) 1 MG tablet Take 1 mg by mouth 2 (two) times daily.     Yes Historical Provider, MD  aspirin 81 MG tablet Take 81 mg by mouth daily.      Historical Provider, MD  cetirizine (ZYRTEC) 10 MG tablet Take 10 mg by mouth daily.      Historical Provider, MD  diphenhydrAMINE (BENADRYL) 25 mg capsule Take 25 mg by mouth every 6 (six) hours as  needed. As needed for allergies.     Historical Provider, MD  docusate sodium (COLACE) 100 MG capsule Take 100 mg by mouth daily.      Historical Provider, MD  folic acid-vitamin b complex-vitamin c-selenium-zinc (DIALYVITE) 3 MG TABS Take 1 tablet by mouth daily.      Historical Provider, MD  hydrOXYzine (ATARAX/VISTARIL) 25 MG tablet Take 25 mg by mouth every 6 (six) hours as needed. As needed for itching.     Historical Provider, MD  levothyroxine (SYNTHROID, LEVOTHROID) 112 MCG tablet Take 112 mcg by mouth daily.      Historical Provider, MD  loperamide (IMODIUM) 2 MG capsule Take 2 mg by mouth 4 (four) times daily as needed. As needed for loose stools.     Historical Provider, MD  lovastatin (MEVACOR) 10 MG tablet Take 10 mg by mouth at bedtime.      Historical Provider, MD  metoprolol succinate (TOPROL-XL) 25 MG 24 hr tablet Take 25 mg by mouth daily.     Historical Provider, MD  oxyCODONE-acetaminophen (PERCOCET) 5-325 MG per tablet Take 1 tablet by mouth every 6 (six) hours as needed. As needed for pain.    Historical Provider, MD  promethazine (PHENERGAN) 25 MG tablet Take 25 mg by mouth every 8 (eight) hours as needed.      Historical Provider, MD     Family History  Problem Relation Age of Onset  . Alcohol abuse Father   . Multiple myeloma Mother   . Diabetes type II Mother   . Hypertension Mother   . Cancer Mother   . Diabetes Mother   . Appendicitis Brother     Social History   Social History  . Marital status: Divorced    Spouse name: N/A  . Number of children: N/A  . Years of education: N/A   Occupational History  . retired Unemployed    was [employer], disabled since 2006   Social History Main Topics  . Smoking status: Former Smoker    Packs/day: 2.00    Years: 30.00    Types: Cigarettes    Quit date: 04/07/1989  . Smokeless tobacco: Never Used  . Alcohol use No     Comment: "socially; I'm not an alcoholic"  . Drug use: No  . Sexual activity: Not Asked    Other Topics Concern  . None   Social History Narrative   Lives with her mother who is ill with lung CA in remission.  The two live in her apartment.  She is a Restaurant manager, fast food.  Enjoys reading, collecting dolls, and working on computer.     Review of Systems: A 12 point ROS discussed and pertinent positives are indicated in the HPI above.  All other systems are negative.  Review of Systems  Constitutional: Positive for activity change, appetite change and  fatigue. Negative for fever.  Respiratory: Negative for shortness of breath.   Cardiovascular: Negative for chest pain.  Gastrointestinal: Negative for abdominal pain.  Musculoskeletal: Positive for back pain and gait problem.  Neurological: Positive for weakness.  Psychiatric/Behavioral: Negative for behavioral problems and confusion.    Vital Signs: BP (!) 113/58 (BP Location: Left Arm)   Pulse 77   Temp 97.8 F (36.6 C) (Oral)   Resp 16   Ht _0  (1.727 m)   Wt 178 lb (80.7 kg)   SpO2 100%   BMI 27.06 kg/m   Physical Exam  Constitutional: She is oriented to person, place, and time.  Cardiovascular: Normal rate and regular rhythm.   Pulmonary/Chest: Effort normal and breath sounds normal.  Abdominal: Soft.  Musculoskeletal:  Left leg no movement- ++ painful fx  Neurological: She is alert and oriented to person, place, and time.  Skin: Skin is warm and dry.  Psychiatric: She has a normal mood and affect. Her behavior is normal. Thought content normal.  Nursing note and vitals reviewed.   Mallampati Score:  MD Evaluation Airway: WNL Heart: WNL Abdomen: WNL Chest/ Lungs: WNL ASA  Classification: 2 Mallampati/Airway Score: One  Imaging: Dg Knee 1-2 Views Left  Result Date: 06/07/2016 CLINICAL DATA:  Fall 3 hours ago.  Prior amputation. EXAM: LEFT KNEE - 1-2 VIEW COMPARISON:  02/11/2010 FINDINGS: Status post below-the-knee amputation. Extensive vascular calcifications. Suboptimal ans atypical patient  positioning, especially on the second image. Osteopenia. Fracture of the distal femoral metaphysis with probable impaction and angulation. This is suboptimally evaluated on the AP images secondary to positioning and altered anatomy. No dislocation. IMPRESSION: Distal femoral fracture, suboptimally evaluated secondary to chronic deformities and suboptimal positioning. Favored to be acute or subacute. CT could be informative to help delineate. Vascular calcifications. Electronically Signed   By: Abigail Miyamoto M.D.   On: 06/07/2016 17:31   Ct Pelvis Wo Contrast  Result Date: 06/07/2016 CLINICAL DATA:  Left hip pain after fall earlier today. EXAM: CT PELVIS WITHOUT CONTRAST TECHNIQUE: Multidetector CT imaging of the pelvis was performed following the standard protocol without intravenous contrast. COMPARISON:  Plain films of the pelvis of 11/13/2009. Prior CT of 04/29/2005. FINDINGS: Soft tissues: Degradation secondary to beam hardening artifact from left hip arthroplasty. Scattered colonic diverticula. Mild motion degradation throughout. Normal terminal ileum and appendix. Normal small bowel caliber. Advanced distal aortic and pelvic atherosclerosis. No pelvic sidewall adenopathy. Probable hysterectomy. Limited evaluation the adnexa. No free intraperitoneal air. No free fluid. Bilateral fat containing inguinal hernias. Fat containing lateral pelvic wall hernia including on image 67/series 202. Musculoskeletal: Pelvic anasarca, without well-defined hematoma. Soft tissue fullness, heterogeneity, and gas within the right psoas muscle including on the order of 4.0 x 4.2 cm on image 26/ series 202. This is new since the CT of 2006. Also likely new since the abdominal CT of 2016. Osteopenia. Left hip arthroplasty. 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 Degenerative sclerosis of the bilateral sacroiliac joints. A moderate compression deformity involves the L4 vertebral body. There is gas within the vertebral body, including on  sagittal image 84/series 206. Findings are new since 09/28/2014 abdominal study. Mild ventral canal encroachment. Advanced lumbosacral spondylosis. IMPRESSION: 1. Left hip arthroplasty, without evidence of complicating fracture or dislocation. Please note given the extent of beam hardening artifact from arthroplasty and osteopenia, CT is relatively low sensitivity for acute fracture and the test of choice is pelvic MRI. 2. New L4 compression deformity since 2016. Concurrent gas within the vertebral body  as well as gas and soft tissue fullness within the right psoas muscle. Findings are overall suspicious for osteomyelitis and discitis with extension into the right psoas muscle representing infected hematoma or abscess. 3.  Possible constipation. These results were called by telephone at the time of interpretation on 06/07/2016 at 5:41 pm to Dr. Orlie Dakin , who verbally acknowledged these results. Electronically Signed   By: Abigail Miyamoto M.D.   On: 06/07/2016 17:45   Mr Lumbar Spine Wo Contrast  Result Date: 06/07/2016 CLINICAL DATA:  Status post fall.  Back pain. EXAM: MRI LUMBAR SPINE WITHOUT CONTRAST TECHNIQUE: Multiplanar, multisequence MR imaging of the lumbar spine was performed. No intravenous contrast was administered. COMPARISON:  CT abdomen and pelvis 06/07/2016. FINDINGS: Segmentation:  Standard. Alignment:  Physiologic. Vertebrae: There is an acute compression fracture of L4, significant superior endplate depression, diffuse bone marrow edema, no involvement of the pedicles. 3 mm retropulsed fragment. Conus medullaris: Extends to the L1 level and appears normal. Paraspinal and other soft tissues: There is T1 hypointense, T2 hyperintense heterogeneous fluid collection enlarging the RIGHT psoas muscle. Representative cross-section as seen on axial image 26 of 36 x 40 mm. Small T1 and T2 hypointense bubbles of air can be seen within. Psoas abscess is suspected. Disc levels: The L1-2, and L2-3 disc  spaces are normal. At L3-4 the disc is unremarkable but there is mild stenosis related to the retropulsed bone and posterior element hypertrophy just below the interspace. At L4-5 there is mild stenosis related to posterior element hypertrophy. At L5-S1 there is advanced disc space narrowing. Osseous ridging with annular bulging is observed. 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 Tissue sampling is warranted. Acute L4 compression fracture, without evidence for discitis. 3 mm retropulsed bone. Given the air which is within the L4 vertebral body as seen on CT, L4 osteomyelitis is not excluded. Electronically Signed   By: Staci Righter M.D.   On: 06/07/2016 21:29   Mr Pelvis Wo Contrast  Result Date: 06/07/2016 CLINICAL DATA:  Pain in the left hip and right knee. Changes suggesting discitis/ osteomyelitis seen on CT. EXAM: MRI PELVIS WITHOUT CONTRAST TECHNIQUE: Multiplanar multisequence MR imaging of the pelvis was performed. No intravenous contrast was administered. COMPARISON:  MRI lumbar spine 06/07/2016.  CT pelvis 06/07/2016. FINDINGS: The examination is technically limited due to prominent susceptibility artifact arising from left hip arthroplasty and due to motion artifact as well as wraparound artifact. Urinary Tract: Bladder wall is not thickened and no filling defects are demonstrated. No distal ureteral dilatation. Bowel: Visualized portions of small and large bowel are not abnormally distended. Vascular/Lymphatic: No significant lymphadenopathy in the pelvis. Visualized iliac and femoral arteries and veins are normal in caliber with normal flow voids demonstrated. Reproductive: Uterus appears surgically absent. Ovaries are not specifically demonstrated but no abnormal adnexal masses are seen. Other: Small fat containing stick daily in hernia on the left. Small right inguinal hernia containing fat. Musculoskeletal: Diffuse muscular atrophy involving the visualized pelvic musculature.  Compression deformity and increased T2 signal intensity demonstrated within the L4 vertebra. This is more completely demonstrated on the MRI lumbar spine and CT. Overall appearance is worrisome for osteomyelitis. See additional reports. Diffuse increased T2 signal intensity and expansion of the right iliopsoas muscle with focal circumscribed collection measuring 1.8 x 4.6 cm in diameter. There is Less prominent T2 signal intensity in the left psoas muscle. Findings are consistent with inflammatory change and likely represents psoas abscess. Postoperative left hip arthroplasty. Susceptibility  artifact arising from the metallic hardware precludes evaluation of the left hip arthroplasty and surrounding bone structures. There is increased T2 signal intensity in the soft tissues around the visualized midshaft of the femur, extending below the field of view. Given the finding of distal left femoral fracture on plain films of the knee, this likely represents hematoma or edema related to the fractures. Additional femoral shaft fractures are not excluded. Bone marrow signal intensities in the visualized pelvis, sacrum, and right hip are unremarkable without evidence of fracture or contusion. IMPRESSION: 1. Abscess and inflammatory changes demonstrated in the right ileus psoas muscle and less prominent in the left psoas muscle. Compression of the L4 vertebra possibly indicating osteomyelitis. See additional reports of CT and lumbar spine MR from earlier today. 2. Large amount of susceptibility artifact arises from left hip arthroplasty precluding evaluation of the left hip for tractor or dislocation. There is soft tissue edema in the musculature around the visualized midshaft femur, probably arising from the known distal femoral fracture. Otherwise, no marrow changes to suggest pelvic, sacral, or right hip fractures. 3. Incidental finding of small left stick galea and and right inguinal hernias containing fat. Electronically  Signed   By: Lucienne Capers M.D.   On: 06/07/2016 23:01    Labs:  CBC:  Recent Labs  06/07/16 1816 06/08/16 0511  WBC 7.1 7.4  HGB 10.5* 10.0*  HCT 32.3* 31.3*  PLT 184 156    COAGS: No results for input(s): INR, APTT in the last 8760 hours.  BMP:  Recent Labs  06/07/16 1816 06/08/16 0511  NA 140 140  K 4.6 4.7  CL 98* 99*  CO2 24 27  GLUCOSE 109* 126*  BUN 24* 31*  CALCIUM 9.3 9.1  CREATININE 4.79* 5.41*  GFRNONAA 9* 7*  GFRAA 10* 9*    LIVER FUNCTION TESTS:  Recent Labs  06/08/16 0511  ALBUMIN 2.8*    TUMOR MARKERS: No results for input(s): AFPTM, CEA, CA199, CHROMGRNA in the last 8760 hours.  Assessment and Plan:  Right psoas abscess  Aspiration/drain placement scheduled in Radiology Risks and Benefits discussed with the patient including bleeding, infection, damage to adjacent structures, bowel perforation/fistula connection, and sepsis. All of the patient's questions were answered, patient is agreeable to proceed. Consent signed and in chart.   Thank you for this interesting consult.  I greatly enjoyed meeting LOYDA COSTIN and look forward to participating in their care.  A copy of this report was sent to the requesting provider on this date.  Electronically Signed: Kadin Canipe A 06/08/2016, 9:14 AM   I spent a total of 40 Minutes    in face to face in clinical consultation, greater than 50% of which was counseling/coordinating care for right psoas abscess asp/drain

## 2016-06-09 ENCOUNTER — Inpatient Hospital Stay (HOSPITAL_COMMUNITY): Payer: Medicare (Managed Care)

## 2016-06-09 ENCOUNTER — Encounter (HOSPITAL_COMMUNITY)
Admission: EM | Disposition: A | Discharge status: Skilled Nursing Facility | Payer: Self-pay | Source: Home / Self Care | Attending: Orthopedic Surgery

## 2016-06-09 ENCOUNTER — Inpatient Hospital Stay (HOSPITAL_COMMUNITY): Payer: Medicare (Managed Care) | Admitting: Anesthesiology

## 2016-06-09 DIAGNOSIS — Y92009 Unspecified place in unspecified non-institutional (private) residence as the place of occurrence of the external cause: Secondary | ICD-10-CM

## 2016-06-09 DIAGNOSIS — Z89512 Acquired absence of left leg below knee: Secondary | ICD-10-CM

## 2016-06-09 DIAGNOSIS — T402X5D Adverse effect of other opioids, subsequent encounter: Secondary | ICD-10-CM

## 2016-06-09 DIAGNOSIS — K5903 Drug induced constipation: Secondary | ICD-10-CM

## 2016-06-09 DIAGNOSIS — S72422A Displaced fracture of lateral condyle of left femur, initial encounter for closed fracture: Secondary | ICD-10-CM | POA: Diagnosis present

## 2016-06-09 DIAGNOSIS — E1122 Type 2 diabetes mellitus with diabetic chronic kidney disease: Secondary | ICD-10-CM

## 2016-06-09 DIAGNOSIS — S300XXA Contusion of lower back and pelvis, initial encounter: Secondary | ICD-10-CM

## 2016-06-09 DIAGNOSIS — S32040A Wedge compression fracture of fourth lumbar vertebra, initial encounter for closed fracture: Secondary | ICD-10-CM

## 2016-06-09 DIAGNOSIS — E669 Obesity, unspecified: Secondary | ICD-10-CM

## 2016-06-09 DIAGNOSIS — K6812 Psoas muscle abscess: Secondary | ICD-10-CM

## 2016-06-09 DIAGNOSIS — S72402A Unspecified fracture of lower end of left femur, initial encounter for closed fracture: Secondary | ICD-10-CM

## 2016-06-09 DIAGNOSIS — W19XXXA Unspecified fall, initial encounter: Secondary | ICD-10-CM

## 2016-06-09 DIAGNOSIS — Z9181 History of falling: Secondary | ICD-10-CM

## 2016-06-09 DIAGNOSIS — S72432A Displaced fracture of medial condyle of left femur, initial encounter for closed fracture: Secondary | ICD-10-CM

## 2016-06-09 DIAGNOSIS — W1839XA Other fall on same level, initial encounter: Secondary | ICD-10-CM

## 2016-06-09 DIAGNOSIS — E1151 Type 2 diabetes mellitus with diabetic peripheral angiopathy without gangrene: Secondary | ICD-10-CM

## 2016-06-09 HISTORY — PX: ORIF FEMUR FRACTURE: SHX2119

## 2016-06-09 LAB — C-REACTIVE PROTEIN: CRP: 3.1 mg/dL — AB (ref <1.0)

## 2016-06-09 LAB — RENAL FUNCTION PANEL
ALBUMIN: 2.4 g/dL — AB (ref 3.5–5.0)
Anion gap: 19 — ABNORMAL HIGH (ref 5–15)
BUN: 51 mg/dL — AB (ref 6–20)
CALCIUM: 8.5 mg/dL — AB (ref 8.9–10.3)
CO2: 22 mmol/L (ref 22–32)
Chloride: 92 mmol/L — ABNORMAL LOW (ref 101–111)
Creatinine, Ser: 7.04 mg/dL — ABNORMAL HIGH (ref 0.44–1.00)
GFR calc Af Amer: 6 mL/min — ABNORMAL LOW (ref >60)
GFR, EST NON AFRICAN AMERICAN: 5 mL/min — AB (ref >60)
GLUCOSE: 77 mg/dL (ref 65–99)
PHOSPHORUS: 8.9 mg/dL — AB (ref 2.5–4.6)
POTASSIUM: 5 mmol/L (ref 3.5–5.1)
SODIUM: 133 mmol/L — AB (ref 135–145)

## 2016-06-09 LAB — GLUCOSE, CAPILLARY
GLUCOSE-CAPILLARY: 69 mg/dL (ref 65–99)
GLUCOSE-CAPILLARY: 75 mg/dL (ref 65–99)
GLUCOSE-CAPILLARY: 92 mg/dL (ref 65–99)

## 2016-06-09 LAB — CBC
HEMATOCRIT: 23.2 % — AB (ref 36.0–46.0)
HEMOGLOBIN: 7.7 g/dL — AB (ref 12.0–15.0)
MCH: 32.1 pg (ref 26.0–34.0)
MCHC: 33.2 g/dL (ref 30.0–36.0)
MCV: 96.7 fL (ref 78.0–100.0)
Platelets: 164 10*3/uL (ref 150–400)
RBC: 2.4 MIL/uL — ABNORMAL LOW (ref 3.87–5.11)
RDW: 15.4 % (ref 11.5–15.5)
WBC: 6.3 10*3/uL (ref 4.0–10.5)

## 2016-06-09 LAB — SEDIMENTATION RATE: Sed Rate: 70 mm/hr — ABNORMAL HIGH (ref 0–22)

## 2016-06-09 LAB — TROPONIN I: Troponin I: <0.03 ng/mL (ref <0.03)

## 2016-06-09 LAB — HEPATITIS C ANTIBODY (REFLEX): HCV Ab: <0.1 s/co ratio (ref 0.0–0.9)

## 2016-06-09 LAB — HIV ANTIBODY (ROUTINE TESTING W REFLEX): HIV Screen 4th Generation wRfx: NONREACTIVE

## 2016-06-09 LAB — HCV COMMENT:

## 2016-06-09 SURGERY — OPEN REDUCTION INTERNAL FIXATION (ORIF) DISTAL FEMUR FRACTURE
Anesthesia: Regional | Site: Leg Lower | Laterality: Left

## 2016-06-09 MED ORDER — FENTANYL CITRATE (PF) 100 MCG/2ML IJ SOLN
INTRAMUSCULAR | Status: AC
  Filled 2016-06-09: qty 2

## 2016-06-09 MED ORDER — SODIUM CHLORIDE 0.9 % IV SOLN: 100.0000 mL | INTRAVENOUS | Status: DC | PRN

## 2016-06-09 MED ORDER — HYDROMORPHONE HCL 1 MG/ML IJ SOLN
INTRAMUSCULAR | Status: AC
  Filled 2016-06-09: qty 2

## 2016-06-09 MED ORDER — ROPIVACAINE HCL 7.5 MG/ML IJ SOLN
INTRAMUSCULAR | Status: DC | PRN
  Administered 2016-06-09: 20 mL via PERINEURAL

## 2016-06-09 MED ORDER — PENTAFLUOROPROP-TETRAFLUOROETH EX AERO: 1.0000 "application " | INHALATION_SPRAY | CUTANEOUS | Status: DC | PRN

## 2016-06-09 MED ORDER — BUPIVACAINE HCL (PF) 0.25 % IJ SOLN
INTRAMUSCULAR | Status: DC | PRN
  Administered 2016-06-09: 10 mL

## 2016-06-09 MED ORDER — MUPIROCIN CALCIUM 2 % EX CREA
TOPICAL_CREAM | CUTANEOUS | Status: AC
  Filled 2016-06-09: qty 15

## 2016-06-09 MED ORDER — FENTANYL CITRATE (PF) 100 MCG/2ML IJ SOLN
INTRAMUSCULAR | Status: DC | PRN
  Administered 2016-06-09: 50 ug via INTRAVENOUS

## 2016-06-09 MED ORDER — LIDOCAINE HCL (CARDIAC) 20 MG/ML IV SOLN
INTRAVENOUS | Status: DC | PRN
  Administered 2016-06-09: 80 mg via INTRATRACHEAL

## 2016-06-09 MED ORDER — BUPIVACAINE-EPINEPHRINE (PF) 0.5% -1:200000 IJ SOLN
INTRAMUSCULAR | Status: DC | PRN
  Administered 2016-06-09: 10 mL via PERINEURAL

## 2016-06-09 MED ORDER — PROPOFOL 10 MG/ML IV BOLUS
INTRAVENOUS | Status: DC | PRN
  Administered 2016-06-09: 100 mg via INTRAVENOUS
  Administered 2016-06-09: 20 mg via INTRAVENOUS

## 2016-06-09 MED ORDER — ACETAMINOPHEN 325 MG PO TABS
650.0000 mg | ORAL_TABLET | Freq: Four times a day (QID) | ORAL | Status: DC | PRN
  Administered 2016-06-11: 650 mg via ORAL

## 2016-06-09 MED ORDER — OXYCODONE HCL 5 MG/5ML PO SOLN: 5.0000 mg | Freq: Once | ORAL | Status: DC | PRN

## 2016-06-09 MED ORDER — ACETAMINOPHEN 650 MG RE SUPP: 650.0000 mg | Freq: Four times a day (QID) | RECTAL | Status: DC | PRN

## 2016-06-09 MED ORDER — HEPARIN SODIUM (PORCINE) 1000 UNIT/ML DIALYSIS: 20.0000 [IU]/kg | INTRAMUSCULAR | Status: DC | PRN

## 2016-06-09 MED ORDER — OXYCODONE HCL 5 MG PO TABS: 5.0000 mg | ORAL_TABLET | Freq: Once | ORAL | Status: DC | PRN

## 2016-06-09 MED ORDER — MIDAZOLAM HCL 2 MG/2ML IJ SOLN
INTRAMUSCULAR | Status: DC | PRN
  Administered 2016-06-09 (×2): 1 mg via INTRAVENOUS

## 2016-06-09 MED ORDER — RENA-VITE PO TABS
1.0000 | ORAL_TABLET | Freq: Every day | ORAL | Status: DC
  Administered 2016-06-10 – 2016-06-11 (×2): 1 via ORAL
  Filled 2016-06-09 (×2): qty 1

## 2016-06-09 MED ORDER — METOCLOPRAMIDE HCL 5 MG/ML IJ SOLN: 5.0000 mg | Freq: Three times a day (TID) | INTRAMUSCULAR | Status: DC | PRN

## 2016-06-09 MED ORDER — SUCCINYLCHOLINE CHLORIDE 200 MG/10ML IV SOSY
PREFILLED_SYRINGE | INTRAVENOUS | Status: AC
  Filled 2016-06-09: qty 10

## 2016-06-09 MED ORDER — CLONIDINE HCL (ANALGESIA) 100 MCG/ML EP SOLN
EPIDURAL | Status: DC | PRN
  Administered 2016-06-09: 75 ug

## 2016-06-09 MED ORDER — VANCOMYCIN HCL IN DEXTROSE 1-5 GM/200ML-% IV SOLN
1000.0000 mg | Freq: Two times a day (BID) | INTRAVENOUS | Status: AC
  Administered 2016-06-10: 1000 mg via INTRAVENOUS
  Filled 2016-06-09: qty 200

## 2016-06-09 MED ORDER — LIDOCAINE 2% (20 MG/ML) 5 ML SYRINGE
INTRAMUSCULAR | Status: AC
  Filled 2016-06-09: qty 5

## 2016-06-09 MED ORDER — ONDANSETRON HCL 4 MG/2ML IJ SOLN: 4.0000 mg | Freq: Four times a day (QID) | INTRAMUSCULAR | Status: DC | PRN

## 2016-06-09 MED ORDER — WHITE PETROLATUM GEL
Status: AC
  Administered 2016-06-09: 11:00:00
  Filled 2016-06-09: qty 1

## 2016-06-09 MED ORDER — PROPOFOL 10 MG/ML IV BOLUS
INTRAVENOUS | Status: AC
  Filled 2016-06-09: qty 20

## 2016-06-09 MED ORDER — ONDANSETRON HCL 4 MG PO TABS: 4.0000 mg | ORAL_TABLET | Freq: Four times a day (QID) | ORAL | Status: DC | PRN

## 2016-06-09 MED ORDER — CHLORHEXIDINE GLUCONATE CLOTH 2 % EX PADS
6.0000 | MEDICATED_PAD | Freq: Every day | CUTANEOUS | Status: DC
Start: 2016-06-09 — End: 2016-06-12
  Administered 2016-06-09 – 2016-06-12 (×3): 6 via TOPICAL

## 2016-06-09 MED ORDER — METOCLOPRAMIDE HCL 5 MG PO TABS: 5.0000 mg | ORAL_TABLET | Freq: Three times a day (TID) | ORAL | Status: DC | PRN

## 2016-06-09 MED ORDER — MUPIROCIN 2 % EX OINT
1.0000 "application " | TOPICAL_OINTMENT | Freq: Two times a day (BID) | CUTANEOUS | Status: DC
  Administered 2016-06-09 – 2016-06-12 (×6): 1 via NASAL
  Filled 2016-06-09: qty 22

## 2016-06-09 MED ORDER — VANCOMYCIN HCL IN DEXTROSE 1-5 GM/200ML-% IV SOLN
INTRAVENOUS | Status: AC
  Filled 2016-06-09: qty 200

## 2016-06-09 MED ORDER — BUPIVACAINE HCL (PF) 0.25 % IJ SOLN
INTRAMUSCULAR | Status: AC
  Filled 2016-06-09: qty 10

## 2016-06-09 MED ORDER — MIDAZOLAM HCL 2 MG/2ML IJ SOLN
INTRAMUSCULAR | Status: AC
  Filled 2016-06-09: qty 2

## 2016-06-09 MED ORDER — FENTANYL CITRATE (PF) 100 MCG/2ML IJ SOLN
25.0000 ug | INTRAMUSCULAR | Status: DC | PRN
  Administered 2016-06-09: 50 ug via INTRAVENOUS

## 2016-06-09 MED ORDER — PHENYLEPHRINE HCL 10 MG/ML IJ SOLN
INTRAMUSCULAR | Status: DC | PRN
  Administered 2016-06-09 (×6): 80 ug via INTRAVENOUS

## 2016-06-09 MED ORDER — ONDANSETRON HCL 4 MG/2ML IJ SOLN
INTRAMUSCULAR | Status: DC | PRN
  Administered 2016-06-09: 4 mg via INTRAVENOUS

## 2016-06-09 MED ORDER — VANCOMYCIN HCL 1000 MG IV SOLR
INTRAVENOUS | Status: DC | PRN
  Administered 2016-06-09: 1000 mg via INTRAVENOUS

## 2016-06-09 MED ORDER — DOCUSATE SODIUM 100 MG PO CAPS
100.0000 mg | ORAL_CAPSULE | Freq: Every day | ORAL | Status: DC
  Administered 2016-06-10 – 2016-06-12 (×3): 100 mg via ORAL
  Filled 2016-06-09 (×4): qty 1

## 2016-06-09 MED ORDER — LIDOCAINE-PRILOCAINE 2.5-2.5 % EX CREA: 1.0000 "application " | TOPICAL_CREAM | CUTANEOUS | Status: DC | PRN

## 2016-06-09 MED ORDER — HEPARIN SODIUM (PORCINE) 1000 UNIT/ML DIALYSIS: 1000.0000 [IU] | INTRAMUSCULAR | Status: DC | PRN

## 2016-06-09 MED ORDER — LIDOCAINE HCL (PF) 1 % IJ SOLN: 5.0000 mL | INTRAMUSCULAR | Status: DC | PRN

## 2016-06-09 MED ORDER — OXYCODONE-ACETAMINOPHEN 5-325 MG PO TABS
ORAL_TABLET | ORAL | Status: AC
Start: 2016-06-09 — End: 2016-06-09
  Administered 2016-06-09: 2 via ORAL
  Filled 2016-06-09: qty 2

## 2016-06-09 MED ORDER — SODIUM CHLORIDE 0.9 % IV SOLN
INTRAVENOUS | Status: DC | PRN
  Administered 2016-06-09: 20:00:00 via INTRAVENOUS

## 2016-06-09 MED ORDER — DEXTROSE 5 % IV SOLN
INTRAVENOUS | Status: DC | PRN
  Administered 2016-06-09: 50 ug/min via INTRAVENOUS

## 2016-06-09 SURGICAL SUPPLY — 45 items
BANDAGE ACE 4X5 VEL STRL LF (GAUZE/BANDAGES/DRESSINGS) ×3 IMPLANT
BANDAGE ACE 6X5 VEL STRL LF (GAUZE/BANDAGES/DRESSINGS) ×6 IMPLANT
DECANTER SPIKE VIAL GLASS SM (MISCELLANEOUS) ×6 IMPLANT
DRAPE C-ARM 42X72 X-RAY (DRAPES) ×3 IMPLANT
DRAPE IMP U-DRAPE 54X76 (DRAPES) ×3 IMPLANT
DRAPE INCISE IOBAN 66X45 STRL (DRAPES) ×3 IMPLANT
DRAPE ORTHO SPLIT 77X108 STRL (DRAPES) ×6
DRAPE PROXIMA HALF (DRAPES) ×3 IMPLANT
DRAPE SURG 17X11 SM STRL (DRAPES) ×3 IMPLANT
DRAPE SURG ORHT 6 SPLT 77X108 (DRAPES) ×2 IMPLANT
DRAPE U-SHAPE 47X51 STRL (DRAPES) ×3 IMPLANT
DRSG PAD ABDOMINAL 8X10 ST (GAUZE/BANDAGES/DRESSINGS) ×15 IMPLANT
DURAPREP 26ML APPLICATOR (WOUND CARE) ×3 IMPLANT
ELECT REM PT RETURN 9FT ADLT (ELECTROSURGICAL) ×3
ELECTRODE REM PT RTRN 9FT ADLT (ELECTROSURGICAL) ×1 IMPLANT
GAUZE SPONGE 4X4 12PLY STRL (GAUZE/BANDAGES/DRESSINGS) ×3 IMPLANT
GAUZE XEROFORM 1X8 LF (GAUZE/BANDAGES/DRESSINGS) ×3 IMPLANT
GLOVE BIO SURGEON ST LM GN SZ9 (GLOVE) ×3 IMPLANT
GLOVE BIOGEL PI IND STRL 8 (GLOVE) ×1 IMPLANT
GLOVE BIOGEL PI INDICATOR 8 (GLOVE) ×2
GLOVE SURG ORTHO 8.0 STRL STRW (GLOVE) ×3 IMPLANT
GOWN STRL REUS W/ TWL LRG LVL3 (GOWN DISPOSABLE) ×2 IMPLANT
GOWN STRL REUS W/ TWL XL LVL3 (GOWN DISPOSABLE) ×1 IMPLANT
GOWN STRL REUS W/TWL LRG LVL3 (GOWN DISPOSABLE) ×4
GOWN STRL REUS W/TWL XL LVL3 (GOWN DISPOSABLE) ×2
KIT BASIN OR (CUSTOM PROCEDURE TRAY) ×3 IMPLANT
KIT ROOM TURNOVER OR (KITS) ×3 IMPLANT
NEEDLE 18GX1X1/2 (RX/OR ONLY) (NEEDLE) ×3 IMPLANT
NS IRRIG 1000ML POUR BTL (IV SOLUTION) ×3 IMPLANT
PACK GENERAL/GYN (CUSTOM PROCEDURE TRAY) ×3 IMPLANT
PAD ARMBOARD 7.5X6 YLW CONV (MISCELLANEOUS) ×6 IMPLANT
PAD CAST 4YDX4 CTTN HI CHSV (CAST SUPPLIES) ×1 IMPLANT
PADDING CAST COTTON 4X4 STRL (CAST SUPPLIES) ×3
PADDING CAST COTTON 6X4 STRL (CAST SUPPLIES) ×6 IMPLANT
SCREW CANN 7.0X70MM (Screw) ×3 IMPLANT
SCREW CANN 7.0X85MM (Screw) ×4 IMPLANT
SCREW CANN RVRS CUT FLT 85X32X (Screw) ×2 IMPLANT
SPLINT PLASTER CAST XFAST 5X30 (CAST SUPPLIES) ×2 IMPLANT
SPLINT PLASTER XFAST SET 5X30 (CAST SUPPLIES) ×4
SPONGE GAUZE 4X4 12PLY STER LF (GAUZE/BANDAGES/DRESSINGS) ×3 IMPLANT
SUT ETHILON 3 0 PS 1 (SUTURE) ×9 IMPLANT
SUT VIC AB 2-0 CTB1 (SUTURE) ×3 IMPLANT
SYRINGE 12CC LL (MISCELLANEOUS) ×3 IMPLANT
TOWEL OR 17X24 6PK STRL BLUE (TOWEL DISPOSABLE) ×3 IMPLANT
TOWEL OR 17X26 10 PK STRL BLUE (TOWEL DISPOSABLE) ×3 IMPLANT

## 2016-06-09 NOTE — Transfer of Care (Signed)
Immediate Anesthesia Transfer of Care Note  Patient: Melodie BouillonMaria C Hoying  Procedure(s) Performed: Procedure(s): CLOSED REDUCTION,  CANNULATED SCREWS PINNING (Left)  Patient Location: PACU  Anesthesia Type:GA combined with regional for post-op pain  Level of Consciousness: awake, sedated and patient cooperative  Airway & Oxygen Therapy: Patient connected to nasal cannula oxygen  Post-op Assessment: Report given to RN and Post -op Vital signs reviewed and stable  Post vital signs: Reviewed and stable  Last Vitals:  Vitals:   06/09/16 2256 06/09/16 2300  BP: (!) 107/39 (!) 107/39  Pulse: 89 82  Resp: (!) 8 (!) 8  Temp: 36.5 C     Last Pain:  Vitals:   06/09/16 1615  TempSrc: Oral  PainSc: 0-No pain      Patients Stated Pain Goal: 6 (06/08/16 0500)  Complications: No apparent anesthesia complications

## 2016-06-09 NOTE — Anesthesia Preprocedure Evaluation (Addendum)
Anesthesia Evaluation  Patient identified by MRN, date of birth, ID band Patient awake    Reviewed: Allergy & Precautions, NPO status , Patient's Chart, lab work & pertinent test results  History of Anesthesia Complications (+) PONV and history of anesthetic complications  Airway Mallampati: II  TM Distance: >3 FB Neck ROM: Full    Dental  (+) Missing, Poor Dentition   Pulmonary asthma , former smoker,    breath sounds clear to auscultation       Cardiovascular hypertension, Pt. on medications and Pt. on home beta blockers + angina + CAD, + Cardiac Stents, + Peripheral Vascular Disease and +CHF   Rhythm:Regular     Neuro/Psych  Headaches, PSYCHIATRIC DISORDERS Anxiety Depression  Neuromuscular disease    GI/Hepatic GERD  ,  Endo/Other  diabetes, Type 2Hypothyroidism   Renal/GU ESRF and DialysisRenal disease     Musculoskeletal   Abdominal   Peds  Hematology  (+) anemia , JEHOVAH'S WITNESS  Anesthesia Other Findings   Reproductive/Obstetrics                           Anesthesia Physical Anesthesia Plan  ASA: III  Anesthesia Plan: Regional and General   Post-op Pain Management:    Induction: Intravenous  Airway Management Planned: LMA  Additional Equipment: None  Intra-op Plan:   Post-operative Plan: Extubation in OR  Informed Consent: I have reviewed the patients History and Physical, chart, labs and discussed the procedure including the risks, benefits and alternatives for the proposed anesthesia with the patient or authorized representative who has indicated his/her understanding and acceptance.   Dental advisory given  Plan Discussed with: CRNA and Surgeon  Anesthesia Plan Comments:       Anesthesia Quick Evaluation

## 2016-06-09 NOTE — Brief Op Note (Signed)
06/07/2016 - 06/09/2016  10:54 PM  PATIENT:  Tina BouillonMaria C Haston  71 y.o. female  PRE-OPERATIVE DIAGNOSIS:  LEFT DISTAL FEMUR FX  POST-OPERATIVE DIAGNOSIS:  LEFT DISTAL FEMUR FX  PROCEDURE:  Procedure(s): CLOSED REDUCTION,  CANNULATED SCREWS PINNING  SURGEON:  Surgeon(s): Cammy CopaScott Gregory Dean, MD  ASSISTANT: none  ANESTHESIA:   general  EBL: 10 ml    Total I/O In: 600 [I.V.:600] Out: 25 [Blood:25]  BLOOD ADMINISTERED: none  DRAINS: none   LOCAL MEDICATIONS USED:  Marcaine mso4 clonodine  SPECIMEN:  No Specimen  COUNTS:  YES  TOURNIQUET:  * No tourniquets in log *  DICTATION: .Other Dictation: Dictation Number done  PLAN OF CARE: Admit to inpatient   PATIENT DISPOSITION:  PACU - hemodynamically stable

## 2016-06-09 NOTE — Anesthesia Procedure Notes (Signed)
Procedure Name: LMA Insertion Date/Time: 06/09/2016 9:00 PM Performed by: Molli HazardGORDON, Jakelyn Squyres M Pre-anesthesia Checklist: Patient identified, Emergency Drugs available, Suction available and Patient being monitored Patient Re-evaluated:Patient Re-evaluated prior to inductionOxygen Delivery Method: Circle system utilized Preoxygenation: Pre-oxygenation with 100% oxygen Intubation Type: IV induction Ventilation: Mask ventilation without difficulty LMA: LMA inserted LMA Size: 4.0 Number of attempts: 1 Placement Confirmation: positive ETCO2 Tube secured with: Tape Dental Injury: Teeth and Oropharynx as per pre-operative assessment

## 2016-06-09 NOTE — Progress Notes (Signed)
Subjective:  co some discomfort  In L lower extrem.  For hd today and Surg  ~~500 pm   Objective Vital signs in last 24 hours: Vitals:   06/08/16 1758 06/08/16 2010 06/09/16 0621 06/09/16 1035  BP: (!) 86/54 118/90 (!) 120/40   Pulse: 77 99 86   Resp:      Temp:  98 F (36.7 C) 98 F (36.7 C)   TempSrc:  Oral Oral   SpO2: 97% 99% 100% 95%  Weight:      Height:       Weight change:  Physical Exam: General:  alert , chronically  Ill-appearing female, NAD Lungs: CTA bilaterally  Breathing is unlabored. Heart: RRR, no rub or mur Abdomen: soft NT + BS  Lower extremities: L BKA with mild swelling, R LE brusies to knee no edema Dialysis Access: RUE AVF +thrill/bruit     OP Dialysis Orders:  [facility] MWF  3h BFR 400 2k/2/25 Ca Linear Na EDW 81 kg Heparin 8500 U IV bolus Hectorol 2 mcg IV 3x q week No ESA OP Labs: P 6.5 Ca 9.4 PTH 279   Problem/Plan: 1.  L distal femur fracture, L4 compression fracture  s/p fall from wheelchair - per admit- ortho/neurosurgy consulted  2. R psoas abscess - per admit - IR drain yesterday - on  abx yet  3.  ESRD -  HD MWF - cont on schedule K+4.7 yest .  4.  Hypertension/volume  - BP controlled on metoprolol /this am no volume excess on exam, meeting EDW as op (  Wts/  hard to access with bed wts only in [facility]) 5.  Anemia  - Hgb 10.0 - follow trend  With surgery - no OP ESA  6.  Metabolic bone disease -  OP Ca/PTH at goal - no binder on med list - now on Fosrenol for ^P=7.9  7.  Nutrition - renal diet/vitamins  8. Hypothyroidism - on Synthroid  9. Depression - baseline /in good mood this am   Tina Pastel, PA-C [facility] [phone] 06/09/2016,11:30 AM  LOS: 2 days   Labs: Basic Metabolic Panel:  Recent Labs Lab 06/07/16 1816 06/08/16 0511  NA 140 140  K 4.6 4.7  CL 98* 99*  CO2 24 27  GLUCOSE 109* 126*  BUN 24* 31*  CREATININE 4.79* 5.41*  CALCIUM 9.3 9.1  PHOS  --  7.9*   Liver Function Tests:  Recent  Labs Lab 06/08/16 0511  ALBUMIN 2.8*   No results for input(s): LIPASE, AMYLASE in the last 168 hours. No results for input(s): AMMONIA in the last 168 hours. CBC:  Recent Labs Lab 06/07/16 1816 06/08/16 0511  WBC 7.1 7.4  NEUTROABS 5.6  --   HGB 10.5* 10.0*  HCT 32.3* 31.3*  MCV 97.3 97.8  PLT 184 156   Cardiac Enzymes:  Recent Labs Lab 06/08/16 1821 06/08/16 2307 06/09/16 0705  TROPONINI <0.03 <0.03 <0.03   CBG:  Recent Labs Lab 06/08/16 0825 06/09/16 0637 06/09/16 1012  GLUCAP 109* 69 75     Medications:  . aspirin  81 mg Oral Daily  . Chlorhexidine Gluconate Cloth  6 each Topical Q0600  . docusate sodium  100 mg Oral Daily  . lanthanum  500 mg Oral TID WC  . levothyroxine  112 mcg Oral QAC breakfast  . metoprolol succinate  25 mg Oral Daily  . multivitamin  1 tablet Oral Daily  . mupirocin ointment  1 application Nasal BID  .  oxyCODONE-acetaminophen  1-2 tablet Oral Q4H  . pravastatin  20 mg Oral q1800

## 2016-06-09 NOTE — Progress Notes (Signed)
Subjective:  [name] is doing well this morning, no acute complaints. Reports that her left leg pain is bothersome but tolerable with the pain meds. Reports poor appetite and does not like the hospital food, currently NPO for closed reduction via ortho this evening. Reports no BM since Saturday and requesting the laxative she takes at home.  Objective:  Vital signs in last 24 hours: Vitals:   06/08/16 1652 06/08/16 1758 06/08/16 2010 06/09/16 0621  BP: (!) 92/55 (!) 86/54 118/90 (!) 120/40  Pulse:  77 99 86  Resp: 11     Temp: 98 F (36.7 C)  98 F (36.7 C) 98 F (36.7 C)  TempSrc: Oral  Oral Oral  SpO2: 95% 97% 99% 100%  Weight:      Height:       General: Obese woman resting in bed in no acute distress Cardiovascular: Regular rate, rhythm, no murmur appreciated  Pulmonary/Chest: Clear to auscultation bilaterally, no wheezes, rales, or rhonchi. Abdominal: Obese, soft, non-tender, non-distended, BS + Extremities: Left BKA, tenderness with light touch to left distal femur, RLE with 1+ pitting edema Neuro: A&O x 3  Ct Knee Left Wo Contrast  Result Date: 06/09/2016 CLINICAL DATA:  Left knee injury due to fall 06/07/2016. Initial encounter. EXAM: CT OF THE LEFT KNEE WITHOUT CONTRAST TECHNIQUE: Multidetector CT imaging of the left knee was performed according to the standard protocol. Multiplanar CT image reconstructions were also generated. COMPARISON:  Plain films left knee 06/07/2016. FINDINGS: Bones/Joint/Cartilage Bones are osteopenic. The knee is flexed at 90 degrees. The patient has a metaphyseal fracture of the distal femur with approximately 2.7 cm impaction and 1/2 shaft width posterior displacement. The distal fracture fragment demonstrates posterior angulation of approximately 45 degrees. The fracture has a nondisplaced component extending through the medial femoral trochlea. No other fracture is identified. Healed below-the-knee amputation is noted. Ligaments Suboptimally  assessed by CT.  Appear intact. Muscles and Tendons Intact with mild atrophy noted. Soft tissues Soft tissue contusion about the patient's fracture is noted IMPRESSION: Mildly comminuted mid diaphyseal fracture of the distal femur as described above. Osteopenia. Status post below the knee amputation. Electronically Signed   By: Inge Rise M.D.   On: 06/09/2016 07:19   Assessment/Plan:  Active Problems:   ESRD (end stage renal disease) on dialysis [facility])   Hypertension   Refusal of blood transfusions as patient is Jehovah's Witness   Compression fracture of L4 lumbar vertebra (HCC)   Closed left femoral fracture (HCC)   Psoas abscess (HCC)  Left distal femur fracture (fragility), L4 compression fracture vs possible osteomyelitis:  Pt presents after mechanical fall with left knee pain and back pain. CT pelvis showed left distal femur fracture and L4 compression fracture vs osteomyelitis. Orthopedic consulted, as well as neurosurgery. Questionable right psoas abscess also seen on imaging. Pt is also Jehovas witness. She does not seem systemically ill but she is at risk for transient bacteremia given hemodialysis. She is not immunosuppressed. May need bone biopsy to distinguish osteo from compression fractures. Clinical history not impressive for osteomyelitis, pain is acute, patient afebrile, without leukocytosis.  - Check ESR, CRP - elevated to 70 and 3.1 respectively -consulted orthopedics, appreciate recs -  planned for closed reduction under anesthesia evening of 1/15 -consulted neurosurgery, appreciate recs -consulted ID, appreciate recs -Norco 1-2 tabs q4 hours -Dilaudid 1 mg q3 hours PRN -follow blood cultures  Right psoas fluid collection: Found on imaging. IR consulted and pt underwent CT-guided needle aspiration on  1/14 which revealed grossly bloody fluid more consistent with hematoma. She is afebrile and has no leukocytosis.  Also ordered HIV and Hepatitis C -consulted ID,  appreciate recs - follow cultures - trend CBC -low threshold to start abx, ideal empiric staph coverage  ESRD MWF - nephrology following - dialysis inpatient today 1/15  Constipation, 2/2 opioid medications - Resumed home Dulcolax  Dispo: Anticipated discharge in approximately 2-3 day(s).   Asencion Partridge, MD 06/09/2016, 7:45 AM

## 2016-06-09 NOTE — Consult Note (Addendum)
[facility]    Date of Admission:  06/07/2016          Reason for Consult: Right psoas muscle fluid collection    Referring Physician: Dr. Asencion Partridge  Principal Problem:   Retroperitoneal fluid collection Active Problems:   Compression fracture of L4 lumbar vertebra (HCC)   Closed left femoral fracture (HCC)   ESRD (end stage renal disease) on dialysis [facility])   Hypertension   Refusal of blood transfusions as patient is Jehovah's Witness   . aspirin  81 mg Oral Daily  . Chlorhexidine Gluconate Cloth  6 each Topical Q0600  . docusate sodium  100 mg Oral Daily  . lanthanum  500 mg Oral TID WC  . levothyroxine  112 mcg Oral QAC breakfast  . metoprolol succinate  25 mg Oral Daily  . [START ON 06/10/2016] multivitamin  1 tablet Oral QHS  . mupirocin ointment  1 application Nasal BID  . oxyCODONE-acetaminophen  1-2 tablet Oral Q4H  . pravastatin  20 mg Oral q1800    Recommendations: 1. Observe off of antibiotics pending culture results   Assessment: I'm not convinced that the incidental finding of a right psoas fluid collection represents an abscess. I suspect that she has a hematoma due to her recent fall and L4 compression fracture. I would have expected her to have had some fairly significant back pain prior to her fall if this was all due to infection. I favor observation off of antibiotics pending final cultures.   HPI: [name] is a 71 y.o. female with end-stage renal disease on hemodialysis. She also has diabetes and peripheral artery disease and has previously undergone left BKA. She is confined to a wheelchair. She recently fell, striking her left knee, when transferring from the toilet to her wheelchair. She had acute onset of low back and left leg pain. She was brought to the emergency room where she was found to have an acute fracture of her distal left femur. She was also noted to have an acute compression fracture of L4 and a  fluid collection in the right psoas muscle. She denies having any back pain prior to her fall. She denies any recent fever, chills or sweats. She has not been treated for any infection recently that she knows of. Yesterday, Dr. Sandi Mariscal aspirated the fluid collection revealing "thick bloody fluid favored to represent a hematoma". Few white blood cells were seen on Gram stain. No organisms were seen and there is no growth on culture so far. Blood cultures remain negative.   Review of Systems: Review of Systems  Constitutional: Negative for chills, diaphoresis and fever.  Respiratory: Negative for cough, sputum production and shortness of breath.   Cardiovascular: Negative for chest pain.  Musculoskeletal: Positive for back pain, falls and joint pain.  Skin: Negative for rash.    Past Medical History:  Diagnosis Date  . Anxiety   . Asthma   . CAD (coronary artery disease)    s/p MI x 2, s/p stent x 4 (05/2005 with DES placed at that time)  . Carpal tunnel syndrome, bilateral   . Chronic systolic congestive heart failure ([place])    EF 35-40% per 2D echo (03/2011)  . Constipation   . Diabetes mellitus type 2, controlled, with complications ([place])    diet controlled  . Diabetic retinopathy   . ESRD (end stage renal disease) on dialysis [facility]) HD since 07/2008   [address] [place]; [place], [place], Friday  . Fibromyalgia   . Hemorrhoids   . Hyperlipidemia   . Hypertension   . Hypothyroid   . Major depression   . Migraines   . Morphea   . Normocytic anemia    BL Hgb 10-12  //  Chronic, likely multifactorial AOCD and possible iron deficiency component with transferrin sat < 20 historically.  . Osteoporosis   . PAD (peripheral artery disease) (HCC)    s/p L BKA  . Peripheral neuropathy ([place])   . PONV (postoperative nausea and vomiting)   . Pulmonary hypertension    PA Peak pressure 57 mmHg per 2D echo (03/2011)  . Refusal of blood transfusions as patient is Jehovah's Witness   . Secondary  hyperparathyroidism ([place])   . Vitamin D deficiency     Social History  Substance Use Topics  . Smoking status: Former Smoker    Packs/day: 2.00    Years: 30.00    Types: Cigarettes    Quit date: 04/07/1989  . Smokeless tobacco: Never Used  . Alcohol use No     Comment: "socially; I'm not an alcoholic"    Family History  Problem Relation Age of Onset  . Alcohol abuse Father   . Multiple myeloma Mother   . Diabetes type II Mother   . Hypertension Mother   . Cancer Mother   . Diabetes Mother   . Appendicitis Brother    Allergies  Allergen Reactions  . Other Anaphylaxis    peaches  . Pumpkin Seed Oil-Saw Palmetto-Zinc [Propalmex] Anaphylaxis  . Shrimp [Shellfish Allergy] Anaphylaxis  . Morphine And Related Other (See Comments)    Pt fell into deep sleep and could not be woken up had to be resuscitated says she did not overdose, unsure whether throat swelled or SOB pt does not remember  . Penicillins     Tongue swelling.     OBJECTIVE: Blood pressure (!) 92/41, pulse 82, temperature 97.7 F (36.5 C), temperature source Oral, resp. rate 15, height 5' 8" (1.727 m), weight 175 lb 7.8 oz (79.6 kg), SpO2 96 %.  Physical Exam  Constitutional: She is oriented to person, place, and time.  She is currently on hemodialysis via a right upper arm fistula. She is uncomfortable in bed due to her left leg pain.  Cardiovascular: Normal rate and regular rhythm.   No murmur heard. Pulmonary/Chest: Effort normal and breath sounds normal. She has no wheezes. She has no rales.  Neurological: She is alert and oriented to person, place, and time.  Skin: No rash noted.  Psychiatric: Mood and affect normal.    Lab Results Lab Results  Component Value Date   WBC 6.3 06/09/2016   HGB 7.7 (L) 06/09/2016   HCT 23.2 (L) 06/09/2016   MCV 96.7 06/09/2016   PLT 164 06/09/2016    Lab Results  Component Value Date   CREATININE 7.04 (H) 06/09/2016   BUN 51 (H) 06/09/2016   NA 133 (L)  06/09/2016   K 5.0 06/09/2016   CL 92 (L) 06/09/2016   CO2 22 06/09/2016    Lab Results  Component Value Date   ALT 18 08/09/2012   AST 28 08/09/2012   ALKPHOS 158 (H) 08/09/2012   BILITOT 0.2 (L) 08/09/2012     Microbiology: Recent Results (from the past 240 hour(s))  Surgical PCR screen     Status: Abnormal   Collection Time: 06/08/16  1:19 AM  Result Value Ref Range Status   MRSA, PCR POSITIVE (A)  NEGATIVE Final    Comment: RESULT CALLED TO, READ BACK BY AND VERIFIED WITH:  TO N(ANDERSON) BY TCLEVELAND 06/08/2016 AT 0439    Staphylococcus aureus POSITIVE (A) NEGATIVE Final    Comment:        The Xpert SA Assay (FDA approved for NASAL specimens in patients over 58 years of age), is one component of a comprehensive surveillance program.  Test performance has been validated by [facility] for patients greater than or equal to 56 year old. It is not intended to diagnose infection nor to guide or monitor treatment.   Culture, blood (Routine X 2) w Reflex to ID Panel     Status: None (Preliminary result)   Collection Time: 06/08/16  8:01 AM  Result Value Ref Range Status   Specimen Description BLOOD LEFT ANTECUBITAL  Final   Special Requests IN PEDIATRIC BOTTLE 2CC  Final   Culture NO GROWTH 1 DAY  Final   Report Status PENDING  Incomplete  Culture, blood (Routine X 2) w Reflex to ID Panel     Status: None (Preliminary result)   Collection Time: 06/08/16  8:08 AM  Result Value Ref Range Status   Specimen Description BLOOD LEFT ANTECUBITAL  Final   Special Requests IN PEDIATRIC BOTTLE 2.0CC  Final   Culture NO GROWTH 1 DAY  Final   Report Status PENDING  Incomplete  Aerobic/Anaerobic Culture (surgical/deep wound)     Status: None (Preliminary result)   Collection Time: 06/08/16  4:31 PM  Result Value Ref Range Status   Specimen Description DRAINAGE  Final   Special Requests DEEP SURGICAL WOUND  Final   Gram Stain   Final    FEW WBC PRESENT, PREDOMINANTLY PMN NO  ORGANISMS SEEN    Culture NO GROWTH < 24 HOURS  Final   Report Status PENDING  Incomplete    Michel Bickers, MD [facility] [phone] pager   [phone] cell 06/09/2016, 2:55 PM

## 2016-06-09 NOTE — Progress Notes (Signed)
Patient doing reasonably well. Plan tonight is for closed reduction and pinning of her distal femur fracture.  In general she does not weight-bear on this extremity.  She does not wear prosthesis.  Her main functional requirement for her left lower extremity is that it be straight.  Currently she has a very distal femur fracture which also has significant associated osteopenia.  There is also the issue of infection versus hematoma in her psoas region.  That area has been aspirated today.  Plan tonight is for closed reduction and attempted pinning of the distal femoral fracture.  I think he would be acceptable since this is a non-weight-bearing or nonweightbearing joint to go across the joint temporarily with pins in order to facilitate healing.  She does not wear her prosthesis so even if this heals in a mildly malaligned position I think he would still be an improvement over its currently flexed position.  Risk and benefits discussed all questions answered

## 2016-06-09 NOTE — Progress Notes (Signed)
Subjective:  [name] does not have any acute complaints or changes overnight.  She says that her left leg is still painful but says that she does not have any back pain on norco now and that her back pain had been at her baseline fibromyalgia pain before hospitalization.   Objective: Vital signs in last 24 hours: Vitals:   06/08/16 1652 06/08/16 1758 06/08/16 2010 06/09/16 0621  BP: (!) 92/55 (!) 86/54 118/90 (!) 120/40  Pulse:  77 99 86  Resp: 11     Temp: 98 F (36.7 C)  98 F (36.7 C) 98 F (36.7 C)  TempSrc: Oral  Oral Oral  SpO2: 95% 97% 99% 100%  Weight:      Height:       Weight change:   Intake/Output Summary (Last 24 hours) at 06/09/16 0844 Last data filed at 06/08/16 1840  Gross per 24 hour  Intake           258.75 ml  Output                0 ml  Net           258.75 ml   Physical Exam Gen: Well-appearing in NAD. HEENT: NCAT. EOMI. Moist mucous membranes.  CV: RRR with no murmurs, rubs, or gallops. Pulm:  CTAB, no wheezes or rhonchi. Abdomen: Soft, non-tender to palpation in four quadrants.  Normal BS appreciated.  Extremities: Left leg BKA.  Mildly tender to palpation of LLE.  No ecchymosis.    Lab Results: @LABTEST2 @ Micro Results: Recent Results (from the past 240 hour(s))  Surgical PCR screen     Status: Abnormal   Collection Time: 06/08/16  1:19 AM  Result Value Ref Range Status   MRSA, PCR POSITIVE (A) NEGATIVE Final    Comment: RESULT CALLED TO, READ BACK BY AND VERIFIED WITH:  TO N(ANDERSON) BY TCLEVELAND 06/08/2016 AT 0439    Staphylococcus aureus POSITIVE (A) NEGATIVE Final    Comment:        The Xpert SA Assay (FDA approved for NASAL specimens in patients over 31 years of age), is one component of a comprehensive surveillance program.  Test performance has been validated by [facility] for patients greater than or equal to 80 year old. It is not intended to diagnose infection nor to guide or monitor treatment.   Aerobic/Anaerobic  Culture (surgical/deep wound)     Status: None (Preliminary result)   Collection Time: 06/08/16  4:31 PM  Result Value Ref Range Status   Specimen Description DRAINAGE  Final   Special Requests DEEP SURGICAL WOUND  Final   Gram Stain   Final    FEW WBC PRESENT, PREDOMINANTLY PMN NO ORGANISMS SEEN    Culture PENDING  Incomplete   Report Status PENDING  Incomplete   Studies/Results: Dg Knee 1-2 Views Left  Result Date: 06/07/2016 CLINICAL DATA:  Fall 3 hours ago.  Prior amputation. EXAM: LEFT KNEE - 1-2 VIEW COMPARISON:  02/11/2010 FINDINGS: Status post below-the-knee amputation. Extensive vascular calcifications. Suboptimal ans atypical patient positioning, especially on the second image. Osteopenia. Fracture of the distal femoral metaphysis with probable impaction and angulation. This is suboptimally evaluated on the AP images secondary to positioning and altered anatomy. No dislocation. IMPRESSION: Distal femoral fracture, suboptimally evaluated secondary to chronic deformities and suboptimal positioning. Favored to be acute or subacute. CT could be informative to help delineate. Vascular calcifications. Electronically Signed   By: Abigail Miyamoto M.D.   On: 06/07/2016 17:31  Ct Pelvis Wo Contrast  Result Date: 06/07/2016 CLINICAL DATA:  Left hip pain after fall earlier today. EXAM: CT PELVIS WITHOUT CONTRAST TECHNIQUE: Multidetector CT imaging of the pelvis was performed following the standard protocol without intravenous contrast. COMPARISON:  Plain films of the pelvis of 11/13/2009. Prior CT of 04/29/2005. FINDINGS: Soft tissues: Degradation secondary to beam hardening artifact from left hip arthroplasty. Scattered colonic diverticula. Mild motion degradation throughout. Normal terminal ileum and appendix. Normal small bowel caliber. Advanced distal aortic and pelvic atherosclerosis. No pelvic sidewall adenopathy. Probable hysterectomy. Limited evaluation the adnexa. No free intraperitoneal air.  No free fluid. Bilateral fat containing inguinal hernias. Fat containing lateral pelvic wall hernia including on image 67/series 202. Musculoskeletal: Pelvic anasarca, without well-defined hematoma. Soft tissue fullness, heterogeneity, and gas within the right psoas muscle including on the order of 4.0 x 4.2 cm on image 26/ series 202. This is new since the CT of 2006. Also likely new since the abdominal CT of 2016. Osteopenia. Left hip arthroplasty. 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 Degenerative sclerosis of the bilateral sacroiliac joints. A moderate compression deformity involves the L4 vertebral body. There is gas within the vertebral body, including on sagittal image 84/series 206. Findings are new since 09/28/2014 abdominal study. Mild ventral canal encroachment. Advanced lumbosacral spondylosis. IMPRESSION: 1. Left hip arthroplasty, without evidence of complicating fracture or dislocation. Please note given the extent of beam hardening artifact from arthroplasty and osteopenia, CT is relatively low sensitivity for acute fracture and the test of choice is pelvic MRI. 2. New L4 compression deformity since 2016. Concurrent gas within the vertebral body as well as gas and soft tissue fullness within the right psoas muscle. Findings are overall suspicious for osteomyelitis and discitis with extension into the right psoas muscle representing infected hematoma or abscess. 3.  Possible constipation. These results were called by telephone at the time of interpretation on 06/07/2016 at 5:41 pm to Dr. Orlie Dakin , who verbally acknowledged these results. Electronically Signed   By: Abigail Miyamoto M.D.   On: 06/07/2016 17:45   Ct Knee Left Wo Contrast  Result Date: 06/09/2016 CLINICAL DATA:  Left knee injury due to fall 06/07/2016. Initial encounter. EXAM: CT OF THE LEFT KNEE WITHOUT CONTRAST TECHNIQUE: Multidetector CT imaging of the left knee was performed according to the standard protocol. Multiplanar CT image  reconstructions were also generated. COMPARISON:  Plain films left knee 06/07/2016. FINDINGS: Bones/Joint/Cartilage Bones are osteopenic. The knee is flexed at 90 degrees. The patient has a metaphyseal fracture of the distal femur with approximately 2.7 cm impaction and 1/2 shaft width posterior displacement. The distal fracture fragment demonstrates posterior angulation of approximately 45 degrees. The fracture has a nondisplaced component extending through the medial femoral trochlea. No other fracture is identified. Healed below-the-knee amputation is noted. Ligaments Suboptimally assessed by CT.  Appear intact. Muscles and Tendons Intact with mild atrophy noted. Soft tissues Soft tissue contusion about the patient's fracture is noted IMPRESSION: Mildly comminuted mid diaphyseal fracture of the distal femur as described above. Osteopenia. Status post below the knee amputation. Electronically Signed   By: Inge Rise M.D.   On: 06/09/2016 07:19   Mr Lumbar Spine Wo Contrast  Result Date: 06/07/2016 CLINICAL DATA:  Status post fall.  Back pain. EXAM: MRI LUMBAR SPINE WITHOUT CONTRAST TECHNIQUE: Multiplanar, multisequence MR imaging of the lumbar spine was performed. No intravenous contrast was administered. COMPARISON:  CT abdomen and pelvis 06/07/2016. FINDINGS: Segmentation:  Standard. Alignment:  Physiologic. Vertebrae: There is an  acute compression fracture of L4, significant superior endplate depression, diffuse bone marrow edema, no involvement of the pedicles. 3 mm retropulsed fragment. Conus medullaris: Extends to the L1 level and appears normal. Paraspinal and other soft tissues: There is T1 hypointense, T2 hyperintense heterogeneous fluid collection enlarging the RIGHT psoas muscle. Representative cross-section as seen on axial image 26 of 36 x 40 mm. Small T1 and T2 hypointense bubbles of air can be seen within. Psoas abscess is suspected. Disc levels: The L1-2, and L2-3 disc spaces are normal.  At L3-4 the disc is unremarkable but there is mild stenosis related to the retropulsed bone and posterior element hypertrophy just below the interspace. At L4-5 there is mild stenosis related to posterior element hypertrophy. At L5-S1 there is advanced disc space narrowing. Osseous ridging with annular bulging is observed. 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 Tissue sampling is warranted. Acute L4 compression fracture, without evidence for discitis. 3 mm retropulsed bone. Given the air which is within the L4 vertebral body as seen on CT, L4 osteomyelitis is not excluded. Electronically Signed   By: Staci Righter M.D.   On: 06/07/2016 21:29   Mr Pelvis Wo Contrast  Result Date: 06/07/2016 CLINICAL DATA:  Pain in the left hip and right knee. Changes suggesting discitis/ osteomyelitis seen on CT. EXAM: MRI PELVIS WITHOUT CONTRAST TECHNIQUE: Multiplanar multisequence MR imaging of the pelvis was performed. No intravenous contrast was administered. COMPARISON:  MRI lumbar spine 06/07/2016.  CT pelvis 06/07/2016. FINDINGS: The examination is technically limited due to prominent susceptibility artifact arising from left hip arthroplasty and due to motion artifact as well as wraparound artifact. Urinary Tract: Bladder wall is not thickened and no filling defects are demonstrated. No distal ureteral dilatation. Bowel: Visualized portions of small and large bowel are not abnormally distended. Vascular/Lymphatic: No significant lymphadenopathy in the pelvis. Visualized iliac and femoral arteries and veins are normal in caliber with normal flow voids demonstrated. Reproductive: Uterus appears surgically absent. Ovaries are not specifically demonstrated but no abnormal adnexal masses are seen. Other: Small fat containing stick daily in hernia on the left. Small right inguinal hernia containing fat. Musculoskeletal: Diffuse muscular atrophy involving the visualized pelvic musculature. Compression deformity  and increased T2 signal intensity demonstrated within the L4 vertebra. This is more completely demonstrated on the MRI lumbar spine and CT. Overall appearance is worrisome for osteomyelitis. See additional reports. Diffuse increased T2 signal intensity and expansion of the right iliopsoas muscle with focal circumscribed collection measuring 1.8 x 4.6 cm in diameter. There is Less prominent T2 signal intensity in the left psoas muscle. Findings are consistent with inflammatory change and likely represents psoas abscess. Postoperative left hip arthroplasty. Susceptibility artifact arising from the metallic hardware precludes evaluation of the left hip arthroplasty and surrounding bone structures. There is increased T2 signal intensity in the soft tissues around the visualized midshaft of the femur, extending below the field of view. Given the finding of distal left femoral fracture on plain films of the knee, this likely represents hematoma or edema related to the fractures. Additional femoral shaft fractures are not excluded. Bone marrow signal intensities in the visualized pelvis, sacrum, and right hip are unremarkable without evidence of fracture or contusion. IMPRESSION: 1. Abscess and inflammatory changes demonstrated in the right ileus psoas muscle and less prominent in the left psoas muscle. Compression of the L4 vertebra possibly indicating osteomyelitis. See additional reports of CT and lumbar spine MR from earlier today. 2. Large amount of susceptibility  artifact arises from left hip arthroplasty precluding evaluation of the left hip for tractor or dislocation. There is soft tissue edema in the musculature around the visualized midshaft femur, probably arising from the known distal femoral fracture. Otherwise, no marrow changes to suggest pelvic, sacral, or right hip fractures. 3. Incidental finding of small left stick galea and and right inguinal hernias containing fat. Electronically Signed   By: Lucienne Capers M.D.   On: 06/07/2016 23:01   Ct Aspiration  Result Date: 06/09/2016 INDICATION: Concern for discitis/osteomyelitis with paraspinal abscess. Please perform CT-guided paraspinal aspiration/drain placement EXAM: CT GUIDANCE NEEDLE PLACEMENT COMPARISON:  Lumbar spine MRI - 05/28/2016 MEDICATIONS: The patient is currently admitted to the hospital and receiving intravenous antibiotics. The antibiotics were administered within an appropriate time frame prior to the initiation of the procedure. ANESTHESIA/SEDATION: Moderate (conscious) sedation was employed during this procedure. A total of Versed 0.5 mg and Fentanyl 25 mcg was administered intravenously. Moderate Sedation Time: 15 minutes. The patient's level of consciousness and vital signs were monitored continuously by radiology nursing throughout the procedure under my direct supervision. CONTRAST:  None COMPLICATIONS: None immediate. PROCEDURE: Informed written consent was obtained from the patient after a discussion of the risks, benefits and alternatives to treatment. The patient was placed right lateral decubitus on the CT gantry and a pre procedural CT was performed re-demonstrating the known fluid collection within the right paraspinal musculature with dominant ill-defined tear complaining component measuring approximately 3.3 x 3.4 cm (image 52, series 2). The procedure was planned. A timeout was performed prior to the initiation of the procedure. The skin overlying the posterior back was prepped and draped in the usual sterile fashion. The overlying soft tissues were anesthetized with 1% lidocaine with epinephrine. Appropriate trajectory was planned with the use of a 22 gauge spinal needle. An 18 gauge trocar needle was advanced into the fluid collection. Appropriate position was confirmed however no fluid could be aspirated from the collection. A short Amplatz wire was coiled within the collection. Appropriate position was again confirmed and the  Amplatz wire was removed however again, no fluid was able to be aspirated from the trocar needle. Again, a short Amplatz wire was coiled within the collection. Appropriate position was confirmed and the trocar needle was exchanged for a Yueh sheath catheter. Approximately 3 cc of thick bloody fluid was aspirated as the Yueh sheath catheter was slowly removed. All aspirated fluid was capped and sent to the laboratory for analysis. A dressing was placed. The patient tolerated the procedure well without immediate post procedural complication. IMPRESSION: Successful CT guided placement of aspiration of approximately 3 cc of thick bloody fluid from right paraspinal fluid collection, which given aspiration results is favored to represent a hematoma. All aspirated fluid was capped and sent to the laboratory for analysis. Given concern that the indeterminate right-sided paraspinal fluid collection represents an evolving hematoma, a drainage catheter was not placed. Electronically Signed   By: Sandi Mariscal M.D.   On: 06/09/2016 08:27   Medications: I have reviewed the patient's current medications. Scheduled Meds: . aspirin  81 mg Oral Daily  . lanthanum  500 mg Oral TID WC  . levothyroxine  112 mcg Oral QAC breakfast  . metoprolol succinate  25 mg Oral Daily  . multivitamin  1 tablet Oral Daily  . oxyCODONE-acetaminophen  1-2 tablet Oral Q4H  . pravastatin  20 mg Oral q1800   Continuous Infusions: PRN Meds:.HYDROmorphone (DILAUDID) injection Assessment/Plan: Active Problems:   ESRD (end  stage renal disease) on dialysis [facility])   Hypertension   Refusal of blood transfusions as patient is Jehovah's Witness   Compression fracture of L4 lumbar vertebra (HCC)   Closed left femoral fracture (HCC)   Psoas abscess (HCC)  Left distal femur fracture: Pt had mechanical fall in bathroom while transferring w/o LOC or head injury.   - orthopedics consulted  - closed reduction today - norco 1-2 tabs q4 - dilaudid  28m PRN for pain  Right psoas fluid collection: Incidentally found on imaging.  S/p IR drainage of L sided paraspinal collection 1/14 which revealed sanguinous fluid more consistent with a psoas hematoma . Afebrile with WBC wnl, but is higher risk for bacteremia given her hemodialysis for ESRD. HIV and Hep C pending. - ESR, CRP - follow blood and paraspinal fluid cultures - will start abx with pos results or signs of infx - consult ID  L4 compression fracture vs osteomyelitis: MRI shows compression that is possibly consistent with osteomyelitis.  Back pain at baseline from fibromyalgia per pt and neurosurgery says would expect more pain with L4/5 discitis or osteomyelitis.  - will continue to follow cultures - consult ID  ESRD on HD: MWF - renal consult for dialysis today  This is a MCareers information officerNote.  The care of the patient was discussed with Dr. JWynetta Emeryand the assessment and plan formulated with their assistance.  Please see their attached note for official documentation of the daily encounter.   LOS: 2 days   DDoug Sou Medical Student 06/09/2016, 8:44 AM

## 2016-06-09 NOTE — Anesthesia Procedure Notes (Signed)
Anesthesia Regional Block:  Femoral nerve block  Pre-Anesthetic Checklist: ,, timeout performed, Correct Patient, Correct Site, Correct Laterality, Correct Procedure, Correct Position, site marked, Risks and benefits discussed,  Surgical consent,  Pre-op evaluation,  At surgeon's request and post-op pain management  Laterality: Lower and Left  Prep: chloraprep       Needles:  Injection technique: Single-shot  Needle Type: Echogenic Stimulator Needle          Additional Needles:  Procedures: ultrasound guided (picture in chart) and nerve stimulator Femoral nerve block  Nerve Stimulator or Paresthesia:  Response: quad, 0.4 mA,   Additional Responses:   Narrative:  Start time: 06/09/2016 8:33 PM End time: 06/09/2016 8:43 PM Injection made incrementally with aspirations every 5 mL.  Performed by: Personally  Anesthesiologist: Roya Gieselman  Additional Notes: H+P and labs reviewed, risks and benefits discussed with patient, procedure tolerated well without complications

## 2016-06-10 ENCOUNTER — Inpatient Hospital Stay (HOSPITAL_COMMUNITY): Payer: Medicare (Managed Care)

## 2016-06-10 DIAGNOSIS — D631 Anemia in chronic kidney disease: Secondary | ICD-10-CM

## 2016-06-10 DIAGNOSIS — Z992 Dependence on renal dialysis: Secondary | ICD-10-CM

## 2016-06-10 DIAGNOSIS — S72412A Displaced unspecified condyle fracture of lower end of left femur, initial encounter for closed fracture: Principal | ICD-10-CM

## 2016-06-10 DIAGNOSIS — Z531 Procedure and treatment not carried out because of patient's decision for reasons of belief and group pressure: Secondary | ICD-10-CM

## 2016-06-10 DIAGNOSIS — Z9889 Other specified postprocedural states: Secondary | ICD-10-CM

## 2016-06-10 DIAGNOSIS — I10 Essential (primary) hypertension: Secondary | ICD-10-CM

## 2016-06-10 DIAGNOSIS — D649 Anemia, unspecified: Secondary | ICD-10-CM

## 2016-06-10 DIAGNOSIS — S32040D Wedge compression fracture of fourth lumbar vertebra, subsequent encounter for fracture with routine healing: Secondary | ICD-10-CM

## 2016-06-10 DIAGNOSIS — R188 Other ascites: Secondary | ICD-10-CM

## 2016-06-10 DIAGNOSIS — N186 End stage renal disease: Secondary | ICD-10-CM

## 2016-06-10 LAB — CBC
HCT: 21.3 % — ABNORMAL LOW (ref 36.0–46.0)
Hemoglobin: 6.9 g/dL — CL (ref 12.0–15.0)
MCH: 32.1 pg (ref 26.0–34.0)
MCHC: 32.4 g/dL (ref 30.0–36.0)
MCV: 99.1 fL (ref 78.0–100.0)
Platelets: 159 10*3/uL (ref 150–400)
RBC: 2.15 MIL/uL — ABNORMAL LOW (ref 3.87–5.11)
RDW: 16 % — AB (ref 11.5–15.5)
WBC: 6.3 10*3/uL (ref 4.0–10.5)

## 2016-06-10 LAB — RENAL FUNCTION PANEL
ALBUMIN: 2.5 g/dL — AB (ref 3.5–5.0)
Anion gap: 11 (ref 5–15)
BUN: 24 mg/dL — AB (ref 6–20)
CALCIUM: 8 mg/dL — AB (ref 8.9–10.3)
CO2: 26 mmol/L (ref 22–32)
Chloride: 97 mmol/L — ABNORMAL LOW (ref 101–111)
Creatinine, Ser: 4.16 mg/dL — ABNORMAL HIGH (ref 0.44–1.00)
GFR calc Af Amer: 12 mL/min — ABNORMAL LOW (ref >60)
GFR calc non Af Amer: 10 mL/min — ABNORMAL LOW (ref >60)
GLUCOSE: 109 mg/dL — AB (ref 65–99)
PHOSPHORUS: 5.5 mg/dL — AB (ref 2.5–4.6)
Potassium: 3.8 mmol/L (ref 3.5–5.1)
SODIUM: 134 mmol/L — AB (ref 135–145)

## 2016-06-10 LAB — GLUCOSE, CAPILLARY
GLUCOSE-CAPILLARY: 68 mg/dL (ref 65–99)
GLUCOSE-CAPILLARY: 72 mg/dL (ref 65–99)
Glucose-Capillary: 66 mg/dL (ref 65–99)
Glucose-Capillary: 90 mg/dL (ref 65–99)

## 2016-06-10 MED ORDER — DARBEPOETIN ALFA 100 MCG/0.5ML IJ SOSY
100.0000 ug | PREFILLED_SYRINGE | INTRAMUSCULAR | Status: DC
  Administered 2016-06-11: 100 ug via INTRAVENOUS
  Filled 2016-06-10: qty 0.5

## 2016-06-10 MED ORDER — OXYCODONE-ACETAMINOPHEN 5-325 MG PO TABS
1.0000 | ORAL_TABLET | Freq: Four times a day (QID) | ORAL | Status: DC | PRN
  Administered 2016-06-10 – 2016-06-12 (×5): 2 via ORAL
  Administered 2016-06-12: 1 via ORAL
  Filled 2016-06-10 (×2): qty 2
  Filled 2016-06-10: qty 1
  Filled 2016-06-10 (×2): qty 2

## 2016-06-10 MED ORDER — HYDROMORPHONE HCL 2 MG/ML IJ SOLN
1.0000 mg | INTRAMUSCULAR | Status: DC | PRN
Start: 2016-06-10 — End: 2016-06-12

## 2016-06-10 NOTE — Evaluation (Signed)
Physical Therapy Evaluation Patient Details Name: Tina BouillonMaria C Fialkowski MRN: [id number] DOB: 1945-07-29 Today's Date: 06/10/2016   History of Present Illness  Admitted after fall transferring to he wheelchair in her bathroom ("brakes were on, but he wheelchair moved") resulting in L distal femur fx (s/p ORIF, NWB; prev BKA and does not use prosthesis), L4 vertebral compression fx (Neurosurgery consulted, considering possibility of osteo or discitis), R psoas fluid collection (s/p drain by IR, awaiting cultures, nothing growing yet, so observing off of antibiotics);  has a past medical history of Chronic systolic congestive heart failure (HCC); Constipation; Diabetes mellitus type 2, controlled, with complications (HCC); Diabetic retinopathy; ESRD (end stage renal disease) on dialysis (HCC) (HD since 07/2008) with sequelae; Fibromyalgia;  Osteoporosis; PAD (peripheral artery disease); Pulmonary hypertension; Refusal of blood transfusions as patient is Jehovah's Witness  Clinical Impression   Patient is s/p above surgeries resulting in functional limitations due to the deficits listed below (see PT Problem List). Was completely independent PTA, and now presents with functional deficits and decr activity tolerance; worth considering post-acute reahb, and Ms. Donnamarie Rossettiineda has been to CIR before with good outcomes; Will place CIR screen; BPs low during session: lying 86/41, HR 74; sitting upright 94/47, HR 85;  Patient will benefit from skilled PT to increase their independence and safety with mobility to allow discharge to the venue listed below.       Follow Up Recommendations CIR    Equipment Recommendations  None recommended by PT    Recommendations for Other Services Rehab consult     Precautions / Restrictions Precautions Precautions: Fall Precaution Comments: Watch BPs Restrictions LLE Weight Bearing: Non weight bearing      Mobility  Bed Mobility Overal bed mobility: Needs Assistance Bed  Mobility: Rolling;Sidelying to Sit;Sit to Sidelying Rolling: Min assist Sidelying to sit: Mod assist     Sit to sidelying: Mod assist General bed mobility comments: Cues for technique; opted for log roll technqiue, given L4 compression fx; mod assist to elevate trunk to sit with assist to support LLE; mod assist to help RLE back into bed with sit to sidelie  Transfers                 General transfer comment: BPs quite low and accompanied by dizziness sitting, so opted to lie back down  Ambulation/Gait                Stairs            Wheelchair Mobility    Modified Rankin (Stroke Patients Only)       Balance                                             Pertinent Vitals/Pain Pain Assessment: 0-10 Pain Score: 6  Pain Location: L residual limb with movement Pain Descriptors / Indicators: Aching;Grimacing;Guarding Pain Intervention(s): Monitored during session    Home Living Family/patient expects to be discharged to:: Private residence Living Arrangements: Non-relatives/Friends Available Help at Discharge: Friend(s);Available PRN/intermittently Type of Home: Apartment (I'm unsure; saw in notes she is in a "facility", but I believe she is in an apartment?) Home Access: Ramped entrance     Home Layout: One level Home Equipment: Wheelchair - manual;Tub bench      Prior Function Level of Independence: Independent with assistive device(s)         Comments: Managing  independently at wheelchair level     Hand Dominance        Extremity/Trunk Assessment   Upper Extremity Assessment Upper Extremity Assessment: Overall WFL for tasks assessed    Lower Extremity Assessment Lower Extremity Assessment: LLE deficits/detail LLE Deficits / Details: Grossly decr AROM and strength L hip, limited by pain; cast/dressing in place with knee in full extension LLE: Unable to fully assess due to pain       Communication    Communication: No difficulties  Cognition Arousal/Alertness: Awake/alert Behavior During Therapy: WFL for tasks assessed/performed Overall Cognitive Status: Within Functional Limits for tasks assessed (though nervous with the anticipation of pain)                      General Comments      Exercises     Assessment/Plan    PT Assessment Patient needs continued PT services  PT Problem List Decreased strength;Decreased range of motion;Decreased activity tolerance;Decreased balance;Decreased mobility;Decreased coordination;Decreased knowledge of use of DME;Pain;Decreased knowledge of precautions          PT Treatment Interventions DME instruction;Functional mobility training;Therapeutic activities;Therapeutic exercise;Balance training;Patient/family education;Wheelchair mobility training    PT Goals (Current goals can be found in the Care Plan section)  Acute Rehab PT Goals Patient Stated Goal: Back to independence PT Goal Formulation: With patient Time For Goal Achievement: 06/24/16 Potential to Achieve Goals: Good    Frequency Min 4X/week   Barriers to discharge        Co-evaluation               End of Session   Activity Tolerance: Patient tolerated treatment well Patient left: in bed;with call bell/phone within reach;with family/visitor present Nurse Communication: Mobility status         Time: 1610-9604 PT Time Calculation (min) (ACUTE ONLY): 22 min   Charges:   PT Evaluation $PT Eval Moderate Complexity: 1 Procedure     PT G CodesLevi Aland 06/10/2016, 2:47 PM  Van Clines, PT  Acute Rehabilitation Services Pager [phone] Office [phone]

## 2016-06-10 NOTE — Op Note (Signed)
NAMMemory Dance:  [name], Tina                ACCOUNT NO.:  [id number]  MEDICAL RECORD NO.:  [id number]  LOCATION:  E40C                         FACILITY:  [facility]  PHYSICIAN:  Burnard BuntingG. Scott Dean, M.D.    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  POSTOPERATIVE DIAGNOSIS:  Left displaced distal femur fracture below- knee amputation.  PROCEDURE:  Closed reduction, percutaneous pinning and percutaneous screws for improvement of alignment.  SURGEON:  Burnard BuntingG. Scott Dean, M.D.  ASSISTANT:  None.  ANESTHESIA:  General.  INDICATIONS:  Tina HesselbachMaria is a 71 year old patient with left BKA.  She does not weight bear through the BKA.  She does not wear her prosthesis through the BKA.  Currently after her fall what is typically a straight BKA has become flexed 90 degrees and is painful.  The patient has multiple medical comorbidities including being on hemodialysis, MRSA positive, as well as having peripheral vascular disease.  Surgical plan was to provisionally improve the alignment with pins which do cross the joint in order to allow the distal fracture to heal in a position in which it does not keep the BKA flexed.  PROCEDURE IN DETAIL:  The patient was brought to the operating room where general anesthetic was induced, preoperative antibiotics administered, time-out was called.  Left leg was pre-scrubbed with alcohol and Betadine and allowed to air dry, prepped with DuraPrep solution and draped in sterile manner.  Reduction was attempted.  In general, the patient had very poor bone quality, which was another reason to avoid a large dissection and plate fixation.  Three screws were placed lateral to medial in order to secure the condyles.  Once this was achieved, the stump was extended and with traction two pins were placed beginning outside of  the knee joint and extending across the joint into the tibia.  With extension of the BKA stump, the distal fragment which contained the condyles moved from flexion to extension. This was more or less unpreventable based on attempting to do this closed and also based on her bone quality.  Nonetheless, the end result of stable fixation with provisional pins across the joint was achieved. At this time, thorough irrigation was performed.  The percutaneous screw sites were irrigated and closed using 2-0 Vicryl and 3-0 nylon.  More proximal crossing pin sites were closed using 2-0 Vicryl and 3-0 nylon. Bactroban cream, Xeroform, and a well-padded BKA cast which extended up to the groin was placed.  This gave added stability.  Plan is to remove these pins in 3 weeks after some measure of fracture healing has occurred.  Keep the leg in a cast for a total of 6 weeks.     Burnard BuntingG. Scott Dean, M.D.     GSD/MEDQ  D:  06/09/2016  T:  06/10/2016  Job:  [id number]

## 2016-06-10 NOTE — Progress Notes (Signed)
Hypoglycemic Event  CBG: 68  Treatment: 15 GM carbohydrate snack  Symptoms: None  Follow-up CBG: Time:0753 CBG Result:72  Possible Reasons for Event: Inadequate meal intake  Comments/MD notified:yes- said to recheck blood sugar in 1hr and call back if not gone up    [name]

## 2016-06-10 NOTE — Progress Notes (Signed)
Pt stable Pain better Plan for dc when medically ready F/u in 10 - 14 days for suture removal nwb left leg

## 2016-06-10 NOTE — Progress Notes (Signed)
No growth yet on culture from back aspiration.  Continuing to monitor.  No antibiotics indicated at this time.

## 2016-06-10 NOTE — Progress Notes (Signed)
PT has recommended IP Rehab following their evaluation.  At this point, it appears that pt. may require SNF level therapies, however we will follow for tolerance.  Additionally, pt's [name] insurance often will not approve for IP Rehab admission.. Will follow along to determine if there is any opprotunity for CIR.  Please call if questions.  Weldon PickingSusan Nikkia Devoss PT Inpatient Rehab Admissions Coordinator Cell [phone] Office [phone]

## 2016-06-10 NOTE — Progress Notes (Signed)
Subjective:  tolerated hd yesterday on schedule/ Minimal   Post op pain /   Objective Vital signs in last 24 hours: Vitals:   06/09/16 2300 06/09/16 2315 06/09/16 2343 06/10/16 0600  BP: (!) 107/39 (!) 107/26 (!) 102/31 (!) 103/45  Pulse: 82 88 93 73  Resp: (!) 8 13 14 16   Temp:  97.7 F (36.5 C) 98.4 F (36.9 C) 98.6 F (37 C)  TempSrc:   Axillary Oral  SpO2: 98% 100% 100% 100%  Weight:      Height:       Weight change:   Physical Exam: General:  alert , chronically Ill-appearing female, NAD Lungs: CTA bilaterally  Breathing is unlabored. Heart:RRR, no rub or mur Abdomen: soft NT + BS Lower extremities:L BKA with dressing dry intact/ R LE with chronic venous type edema  Dialysis Access: RUE AVF +thrill/bruit     OP Dialysis Orders:  AF MWF 3h BFR 400 2k/2/25 Ca Linear Na EDW 81 kg Heparin 8500 U IV bolus Hectorol 2 mcg IV 3x q week No ESA OP Labs: P 6.5 Ca 9.4 PTH 279   Problem/Plan: 1.  L distal femur fracture, L4 compression fracture - s/p fall from wheelchair / Sp yesterday -CLOSED REDUCTION,  CANNULATED SCREWS PINNING L Femur  - ortho/ neurosurgy consulted  2. R psoas abscess -  ID following no antibiotics yet =No growth yet on culture from back aspiration. 3. ESRD- HD MWF - cont on schedule  yest ./ k today 3.8   4. Hypertension/volume- BP  lowish  Will dc metoprolol 25mg  q day /  / no volume excess on exam,  BED WT = 79.6 below op edw 81 / no uf with hd tomor. (  Wts/  hard to access with bed wts ) 5. Anemia- Hgb 10.0 ->6.9  Post op  / DW PT she DOES NOT WANT ANY BLOOD  TRANSFUSIONS ,  Melene Muller aranesp  On hd in am /  Was on no OP ESA  6. Metabolic bone disease- OP Ca/PTH at goal - no binder on med list -   Phos7.9> 8.9 > 5.5 this am  now on Fosrenol  7. Nutrition- renal diet/vitamins  8. Hypothyroidism- on Synthroid  9. Depression- baseline /again good mood this am   Lenny Pastel, PA-C [facility] Beeper  [phone] 06/10/2016,11:18 AM  LOS: 3 days   Labs: Basic Metabolic Panel:  Recent Labs Lab 06/08/16 0511 06/09/16 1220 06/10/16 0906  NA 140 133* 134*  K 4.7 5.0 3.8  CL 99* 92* 97*  CO2 27 22 26   GLUCOSE 126* 77 109*  BUN 31* 51* 24*  CREATININE 5.41* 7.04* 4.16*  CALCIUM 9.1 8.5* 8.0*  PHOS 7.9* 8.9* 5.5*   Liver Function Tests:  Recent Labs Lab 06/08/16 0511 06/09/16 1220 06/10/16 0906  ALBUMIN 2.8* 2.4* 2.5*   No results for input(s): LIPASE, AMYLASE in the last 168 hours. No results for input(s): AMMONIA in the last 168 hours. CBC:  Recent Labs Lab 06/07/16 1816 06/08/16 0511 06/09/16 1227 06/10/16 0906  WBC 7.1 7.4 6.3 6.3  NEUTROABS 5.6  --   --   --   HGB 10.5* 10.0* 7.7* 6.9*  HCT 32.3* 31.3* 23.2* 21.3*  MCV 97.3 97.8 96.7 99.1  PLT 184 156 164 159   Cardiac Enzymes:  Recent Labs Lab 06/08/16 1821 06/08/16 2307 06/09/16 0705  TROPONINI <0.03 <0.03 <0.03   CBG:  Recent Labs Lab 06/09/16 1012 06/09/16 2258 06/10/16 0654 06/10/16 0750  06/10/16 0853  GLUCAP 75 92 68 72 90    Studies/Results: Dg Knee 1-2 Views Left  Result Date: 06/09/2016 CLINICAL DATA:  Closed reduction, cannulated screw/pinning of left femur EXAM: DG C-ARM 61-120 MIN; LEFT KNEE - 1-2 VIEW COMPARISON:  CT from 06/08/2016, radiographs 06/07/2016 FINDINGS: A total of 2 minutes 28 seconds of fluoroscopic time was utilized for placement of 3 cannulated screws and 2 pins involving a distal diaphyseal fracture of the femur. Improved alignment. IMPRESSION: Closed reduction with improved alignment of supracondylar left femoral fracture. Three cannulated screws and 2 pins traverse the site of fracture. Electronically Signed   By: Tollie Eth M.D.   On: 06/09/2016 23:20   Ct Knee Left Wo Contrast  Result Date: 06/09/2016 CLINICAL DATA:  Left knee injury due to fall 06/07/2016. Initial encounter. EXAM: CT OF THE LEFT KNEE WITHOUT CONTRAST TECHNIQUE: Multidetector CT imaging of the  left knee was performed according to the standard protocol. Multiplanar CT image reconstructions were also generated. COMPARISON:  Plain films left knee 06/07/2016. FINDINGS: Bones/Joint/Cartilage Bones are osteopenic. The knee is flexed at 90 degrees. The patient has a metaphyseal fracture of the distal femur with approximately 2.7 cm impaction and 1/2 shaft width posterior displacement. The distal fracture fragment demonstrates posterior angulation of approximately 45 degrees. The fracture has a nondisplaced component extending through the medial femoral trochlea. No other fracture is identified. Healed below-the-knee amputation is noted. Ligaments Suboptimally assessed by CT.  Appear intact. Muscles and Tendons Intact with mild atrophy noted. Soft tissues Soft tissue contusion about the patient's fracture is noted IMPRESSION: Mildly comminuted mid diaphyseal fracture of the distal femur as described above. Osteopenia. Status post below the knee amputation. Electronically Signed   By: Drusilla Kanner M.D.   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 Please perform CT-guided paraspinal aspiration/drain placement EXAM: CT GUIDANCE NEEDLE PLACEMENT COMPARISON:  Lumbar spine MRI - 05/28/2016 MEDICATIONS: The patient is currently admitted to the hospital and receiving intravenous antibiotics. The antibiotics were administered within an appropriate time frame prior to the initiation of the procedure. ANESTHESIA/SEDATION: Moderate (conscious) sedation was employed during this procedure. A total of Versed 0.5 mg and Fentanyl 25 mcg was administered intravenously. Moderate Sedation Time: 15 minutes. The patient's level of consciousness and vital signs were monitored continuously by radiology nursing throughout the procedure under my direct supervision. CONTRAST:  None COMPLICATIONS: None immediate. PROCEDURE: Informed written  consent was obtained from the patient after a discussion of the risks, benefits and alternatives to treatment. The patient was placed right lateral decubitus on the CT gantry and a pre procedural CT was performed re-demonstrating the known fluid collection within the right paraspinal musculature with dominant ill-defined tear complaining component measuring approximately 3.3 x 3.4 cm (image 52, series 2). The procedure was planned. A timeout was performed prior to the initiation of the procedure. The skin overlying the posterior back was prepped and draped in the usual sterile fashion. The overlying soft tissues were anesthetized with 1% lidocaine with epinephrine. Appropriate trajectory was planned with the use of a 22 gauge spinal needle. An 18 gauge trocar needle was advanced into the fluid collection. Appropriate position was confirmed however no fluid could be aspirated from the collection. A short Amplatz wire was coiled within the collection. Appropriate position was again confirmed and the Amplatz wire was removed however again, no fluid was able to be aspirated from the trocar needle. Again, a short  Amplatz wire was coiled within the collection. Appropriate position was confirmed and the trocar needle was exchanged for a Yueh sheath catheter. Approximately 3 cc of thick bloody fluid was aspirated as the Yueh sheath catheter was slowly removed. All aspirated fluid was capped and sent to the laboratory for analysis. A dressing was placed. The patient tolerated the procedure well without immediate post procedural complication. IMPRESSION: Successful CT guided placement of aspiration of approximately 3 cc of thick bloody fluid from right paraspinal fluid collection, which given aspiration results is favored to represent a hematoma. All aspirated fluid was capped and sent to the laboratory for analysis. Given concern that the indeterminate right-sided paraspinal fluid collection represents an evolving hematoma,  a drainage catheter was not placed. Electronically Signed   By: Simonne ComeJohn  Watts M.D.   On: 06/09/2016 08:27   Dg Knee Left Port  Result Date: 06/09/2016 CLINICAL DATA:  Status post surgery of closed bicondylar fracture of distal femur. EXAM: PORTABLE LEFT KNEE - 1-2 VIEW COMPARISON:  Intraoperative fluoroscopic images of the left knee from earlier same day. FINDINGS: Again noted are the 3 cannulated screws and 2 pins traversing the distal femur fracture site, grossly stable in position compared to the intraoperative fluoroscopic images from earlier today. There is improved alignment at the femoral fracture site compared to the preoperative plain film of 06/08/2016. IMPRESSION: Status post closed reduction with improved alignment at the distal left femur fracture site, status post placement of cannulated screws and 2 pins which traverse the fracture site. No evidence of surgical complicating feature. Electronically Signed   By: Bary RichardStan  Maynard M.D.   On: 06/09/2016 23:51   Dg C-arm 1-60 Min  Result Date: 06/09/2016 CLINICAL DATA:  Closed reduction, cannulated screw/pinning of left femur EXAM: DG C-ARM 61-120 MIN; LEFT KNEE - 1-2 VIEW COMPARISON:  CT from 06/08/2016, radiographs 06/07/2016 FINDINGS: A total of 2 minutes 28 seconds of fluoroscopic time was utilized for placement of 3 cannulated screws and 2 pins involving a distal diaphyseal fracture of the femur. Improved alignment. IMPRESSION: Closed reduction with improved alignment of supracondylar left femoral fracture. Three cannulated screws and 2 pins traverse the site of fracture. Electronically Signed   By: Tollie Ethavid  Kwon M.D.   On: 06/09/2016 23:20   Medications:  . aspirin  81 mg Oral Daily  . Chlorhexidine Gluconate Cloth  6 each Topical Q0600  . docusate sodium  100 mg Oral Daily  . lanthanum  500 mg Oral TID WC  . levothyroxine  112 mcg Oral QAC breakfast  . metoprolol succinate  25 mg Oral Daily  . multivitamin  1 tablet Oral QHS  . mupirocin  ointment  1 application Nasal BID  . oxyCODONE-acetaminophen  1-2 tablet Oral Q4H  . pravastatin  20 mg Oral q1800

## 2016-06-10 NOTE — Progress Notes (Signed)
  Date: 06/10/2016  Patient name: [name]  Medical record number: [id number]  Date of birth: 05/31/1945   I have seen and evaluated this patient and I have discussed the plan of care with the house staff. Please see Dr. Henriette CombsJohnson's note for complete details. I concur with his findings with the following additions/corrections:   I am hopeful that her drop in Hgb is dilutional to some extent and related to her procedures (IR drainage and femur fracture reduction).  We will check Hgb again in the morning.  If stabilized, she would likely be ready for discharge from our standpoint.  If continuing to drop, we may need to consider iron infusion vs. Aranesp.  She does not want blood transfusions.   Inez CatalinaEmily B Mullen, MD 06/10/2016, 2:39 PM

## 2016-06-10 NOTE — Progress Notes (Signed)
Subjective:  [name] says she is feeling fine today after reduction yesterday and also does not note any changes in back pain and no new leg pain.  She says she has started to feel dizzy over the past 2 days, but no SOB today.    Objective: Vital signs in last 24 hours: Vitals:   06/09/16 2300 06/09/16 2315 06/09/16 2343 06/10/16 0600  BP: (!) 107/39 (!) 107/26 (!) 102/31 (!) 103/45  Pulse: 82 88 93 73  Resp: (!) 8 13 14 16   Temp:  97.7 F (36.5 C) 98.4 F (36.9 C) 98.6 F (37 C)  TempSrc:   Axillary Oral  SpO2: 98% 100% 100% 100%  Weight:      Height:       Weight change:   Intake/Output Summary (Last 24 hours) at 06/10/16 1148 Last data filed at 06/10/16 0900  Gross per 24 hour  Intake              800 ml  Output               25 ml  Net              775 ml   Physical Exam Gen: Well-appearing in NAD HEENT: NCAT. EOMI. EOMI. Moist mucous membranes.  CV: RRR with no murmurs, rubs, or gallops. Pulm:  CTAB, no wheezes or rhonchi. Abdomen: Soft, non-tender to palpation in four quadrants.  Normal BS appreciated.  Extremities: L BKA wrapped post closed reduction yesterday. No obvious swelling or ecchymosis. Pulses in all extremities intact.  Lab Results: CBC Latest Ref Rng & Units 06/10/2016 06/09/2016 06/08/2016  WBC 4.0 - 10.5 K/uL 6.3 6.3 7.4  Hemoglobin 12.0 - 15.0 g/dL 6.9(LL) 7.7(L) 10.0(L)  Hematocrit 36.0 - 46.0 % 21.3(L) 23.2(L) 31.3(L)  Platelets 150 - 400 K/uL 159 164 156  MCV - 99.1  BMP Latest Ref Rng & Units 06/10/2016 06/09/2016 06/08/2016  Glucose 65 - 99 mg/dL 161(W) 77 960(A)  BUN 6 - 20 mg/dL 54(U) 98(J) 19(J)  Creatinine 0.44 - 1.00 mg/dL 4.78(G) 9.56(O) 1.30(Q)  Sodium 135 - 145 mmol/L 134(L) 133(L) 140  Potassium 3.5 - 5.1 mmol/L 3.8 5.0 4.7  Chloride 101 - 111 mmol/L 97(L) 92(L) 99(L)  CO2 22 - 32 mmol/L 26 22 27   Calcium 8.9 - 10.3 mg/dL 8.0(L) 8.5(L) 9.1   Micro Results: Recent Results (from the past 240 hour(s))  Surgical PCR screen      Status: Abnormal   Collection Time: 06/08/16  1:19 AM  Result Value Ref Range Status   MRSA, PCR POSITIVE (A) NEGATIVE Final    Comment: RESULT CALLED TO, READ BACK BY AND VERIFIED WITH:  TO N(ANDERSON) BY TCLEVELAND 06/08/2016 AT 0439    Staphylococcus aureus POSITIVE (A) NEGATIVE Final    Comment:        The Xpert SA Assay (FDA approved for NASAL specimens in patients over 43 years of age), is one component of a comprehensive surveillance program.  Test performance has been validated by [facility] for patients greater than or equal to 61 year old. It is not intended to diagnose infection nor to guide or monitor treatment.   Culture, blood (Routine X 2) w Reflex to ID Panel     Status: None (Preliminary result)   Collection Time: 06/08/16  8:01 AM  Result Value Ref Range Status   Specimen Description BLOOD LEFT ANTECUBITAL  Final   Special Requests IN PEDIATRIC BOTTLE [facility]  Final   Culture NO  GROWTH 1 DAY  Final   Report Status PENDING  Incomplete  Culture, blood (Routine X 2) w Reflex to ID Panel     Status: None (Preliminary result)   Collection Time: 06/08/16  8:08 AM  Result Value Ref Range Status   Specimen Description BLOOD LEFT ANTECUBITAL  Final   Special Requests IN PEDIATRIC BOTTLE 2.0CC  Final   Culture NO GROWTH 1 DAY  Final   Report Status PENDING  Incomplete  Aerobic/Anaerobic Culture (surgical/deep wound)     Status: None (Preliminary result)   Collection Time: 06/08/16  4:31 PM  Result Value Ref Range Status   Specimen Description DRAINAGE  Final   Special Requests DEEP SURGICAL WOUND  Final   Gram Stain   Final    FEW WBC PRESENT, PREDOMINANTLY PMN NO ORGANISMS SEEN    Culture NO GROWTH < 24 HOURS  Final   Report Status PENDING  Incomplete   Studies/Results: Dg Knee 1-2 Views Left  Result Date: 06/09/2016 CLINICAL DATA:  Closed reduction, cannulated screw/pinning of left femur EXAM: DG C-ARM 61-120 MIN; LEFT KNEE - 1-2 VIEW COMPARISON:  CT from  06/08/2016, radiographs 06/07/2016 FINDINGS: A total of 2 minutes 28 seconds of fluoroscopic time was utilized for placement of 3 cannulated screws and 2 pins involving a distal diaphyseal fracture of the femur. Improved alignment. IMPRESSION: Closed reduction with improved alignment of supracondylar left femoral fracture. Three cannulated screws and 2 pins traverse the site of fracture. Electronically Signed   By: Tollie Eth M.D.   On: 06/09/2016 23:20   Ct Knee Left Wo Contrast  Result Date: 06/09/2016 CLINICAL DATA:  Left knee injury due to fall 06/07/2016. Initial encounter. EXAM: CT OF THE LEFT KNEE WITHOUT CONTRAST TECHNIQUE: Multidetector CT imaging of the left knee was performed according to the standard protocol. Multiplanar CT image reconstructions were also generated. COMPARISON:  Plain films left knee 06/07/2016. FINDINGS: Bones/Joint/Cartilage Bones are osteopenic. The knee is flexed at 90 degrees. The patient has a metaphyseal fracture of the distal femur with approximately 2.7 cm impaction and 1/2 shaft width posterior displacement. The distal fracture fragment demonstrates posterior angulation of approximately 45 degrees. The fracture has a nondisplaced component extending through the medial femoral trochlea. No other fracture is identified. Healed below-the-knee amputation is noted. Ligaments Suboptimally assessed by CT.  Appear intact. Muscles and Tendons Intact with mild atrophy noted. Soft tissues Soft tissue contusion about the patient's fracture is noted IMPRESSION: Mildly comminuted mid diaphyseal fracture of the distal femur as described above. Osteopenia. Status post below the knee amputation. Electronically Signed   By: Drusilla Kanner M.D.   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 Please perform CT-guided paraspinal aspiration/drain placement EXAM: CT GUIDANCE NEEDLE PLACEMENT  COMPARISON:  Lumbar spine MRI - 05/28/2016 MEDICATIONS: The patient is currently admitted to the hospital and receiving intravenous antibiotics. The antibiotics were administered within an appropriate time frame prior to the initiation of the procedure. ANESTHESIA/SEDATION: Moderate (conscious) sedation was employed during this procedure. A total of Versed 0.5 mg and Fentanyl 25 mcg was administered intravenously. Moderate Sedation Time: 15 minutes. The patient's level of consciousness and vital signs were monitored continuously by radiology nursing throughout the procedure under my direct supervision. CONTRAST:  None COMPLICATIONS: None immediate. PROCEDURE: Informed written consent was obtained from the patient after a discussion of the risks, benefits and alternatives to treatment. The patient was placed right lateral decubitus on the  CT gantry and a pre procedural CT was performed re-demonstrating the known fluid collection within the right paraspinal musculature with dominant ill-defined tear complaining component measuring approximately 3.3 x 3.4 cm (image 52, series 2). The procedure was planned. A timeout was performed prior to the initiation of the procedure. The skin overlying the posterior back was prepped and draped in the usual sterile fashion. The overlying soft tissues were anesthetized with 1% lidocaine with epinephrine. Appropriate trajectory was planned with the use of a 22 gauge spinal needle. An 18 gauge trocar needle was advanced into the fluid collection. Appropriate position was confirmed however no fluid could be aspirated from the collection. A short Amplatz wire was coiled within the collection. Appropriate position was again confirmed and the Amplatz wire was removed however again, no fluid was able to be aspirated from the trocar needle. Again, a short Amplatz wire was coiled within the collection. Appropriate position was confirmed and the trocar needle was exchanged for a Yueh sheath  catheter. Approximately 3 cc of thick bloody fluid was aspirated as the Yueh sheath catheter was slowly removed. All aspirated fluid was capped and sent to the laboratory for analysis. A dressing was placed. The patient tolerated the procedure well without immediate post procedural complication. IMPRESSION: Successful CT guided placement of aspiration of approximately 3 cc of thick bloody fluid from right paraspinal fluid collection, which given aspiration results is favored to represent a hematoma. All aspirated fluid was capped and sent to the laboratory for analysis. Given concern that the indeterminate right-sided paraspinal fluid collection represents an evolving hematoma, a drainage catheter was not placed. Electronically Signed   By: Simonne Come M.D.   On: 06/09/2016 08:27   Dg Knee Left Port  Result Date: 06/09/2016 CLINICAL DATA:  Status post surgery of closed bicondylar fracture of distal femur. EXAM: PORTABLE LEFT KNEE - 1-2 VIEW COMPARISON:  Intraoperative fluoroscopic images of the left knee from earlier same day. FINDINGS: Again noted are the 3 cannulated screws and 2 pins traversing the distal femur fracture site, grossly stable in position compared to the intraoperative fluoroscopic images from earlier today. There is improved alignment at the femoral fracture site compared to the preoperative plain film of 06/08/2016. IMPRESSION: Status post closed reduction with improved alignment at the distal left femur fracture site, status post placement of cannulated screws and 2 pins which traverse the fracture site. No evidence of surgical complicating feature. Electronically Signed   By: Bary Richard M.D.   On: 06/09/2016 23:51   Dg C-arm 1-60 Min  Result Date: 06/09/2016 CLINICAL DATA:  Closed reduction, cannulated screw/pinning of left femur EXAM: DG C-ARM 61-120 MIN; LEFT KNEE - 1-2 VIEW COMPARISON:  CT from 06/08/2016, radiographs 06/07/2016 FINDINGS: A total of 2 minutes 28 seconds of  fluoroscopic time was utilized for placement of 3 cannulated screws and 2 pins involving a distal diaphyseal fracture of the femur. Improved alignment. IMPRESSION: Closed reduction with improved alignment of supracondylar left femoral fracture. Three cannulated screws and 2 pins traverse the site of fracture. Electronically Signed   By: Tollie Eth M.D.   On: 06/09/2016 23:20   Medications: I have reviewed the patient's current medications. Scheduled Meds: . aspirin  81 mg Oral Daily  . Chlorhexidine Gluconate Cloth  6 each Topical Q0600  . docusate sodium  100 mg Oral Daily  . lanthanum  500 mg Oral TID WC  . levothyroxine  112 mcg Oral QAC breakfast  . metoprolol succinate  25 mg Oral  Daily  . multivitamin  1 tablet Oral QHS  . mupirocin ointment  1 application Nasal BID  . oxyCODONE-acetaminophen  1-2 tablet Oral Q4H  . pravastatin  20 mg Oral q1800   Continuous Infusions: PRN Meds:.acetaminophen **OR** acetaminophen, HYDROmorphone (DILAUDID) injection, metoCLOPramide **OR** metoCLOPramide (REGLAN) injection, ondansetron **OR** ondansetron (ZOFRAN) IV Assessment/Plan: Principal Problem:   Retroperitoneal fluid collection Active Problems:   ESRD (end stage renal disease) on dialysis (HCC)   Hypertension   Refusal of blood transfusions as patient is Jehovah's Witness   Compression fracture of L4 lumbar vertebra (HCC)   Closed left femoral fracture (HCC)   Closed bicondylar fracture of distal femur, left, initial encounter (HCC)  Normocytic anemia: Hgb 6.9 from 10.5 on admission. Concern for acute blood loss anemia given acute drop 1/15 to 7.7. Possible this has resolved already. Pt is a Jehovah's witness and will not accept blood products. CT scan Abd/Pelvis shows psoas hematoma resolving.  LLE w/o skin changes or obvious swelling. - follow CBC - Fe studies  Right psoas hematoma: CT scan today shows resolving from CT scan where incidentally found 1/13. IR drainage of collection  revealed sanguinous fluid consistent hematoma . Afebrile with WBC wnl, but is higher risk for bacteremia given her hemodialysis for ESRD. - follow blood and paraspinal fluid cultures - will start abx with pos results or signs of infx - consulted ID  L4 compression fracture: Likely compression fracture without osteomyelitis due to lack of infectious symptoms/back pain consistent with osteomyelitis. MRI showed compression that could not rule out osteomyelitis. - will continue to follow cultures - consulted ID  Left distal femur fracture: S/p closed reduction 1/15.  Pt had mechanical fall while transferring. - orthopedics consulted  - norco 1-2 tabs q4 - dilaudid 2mg  PRN for pain  ESRD on HD: MWF - renal consult for dialysis tomorrow  This is a Psychologist, occupationalMedical Student Note.  The care of the patient was discussed with Dr. Laural BenesJohnson and the assessment and plan formulated with their assistance.  Please see their attached note for official documentation of the daily encounter.   LOS: 3 days   Sandrea Matteaniel S Xzaiver Vayda, Medical Student 06/10/2016, 11:48 AM

## 2016-06-10 NOTE — Progress Notes (Signed)
Notified Dr. Rennis Pettyf critical hemoglobin level of 6.9.

## 2016-06-10 NOTE — Anesthesia Postprocedure Evaluation (Addendum)
Anesthesia Post Note  Patient: Tina BouillonMaria C [name]  Procedure(s) Performed: Procedure(s) (LRB): CLOSED REDUCTION,  CANNULATED SCREWS PINNING (Left)  Patient location during evaluation: PACU Anesthesia Type: Regional Level of consciousness: awake and alert Pain management: pain level controlled Vital Signs Assessment: post-procedure vital signs reviewed and stable Respiratory status: spontaneous breathing, nonlabored ventilation, respiratory function stable and patient connected to nasal cannula oxygen Cardiovascular status: blood pressure returned to baseline and stable Postop Assessment: no signs of nausea or vomiting Anesthetic complications: no       Last Vitals:  Vitals:   06/09/16 2343 06/10/16 0600  BP: (!) 102/31 (!) 103/45  Pulse: 93 73  Resp: 14 16  Temp: 36.9 C 37 C    Last Pain:  Vitals:   06/10/16 0600  TempSrc: Oral  PainSc:                  Ambyr Qadri

## 2016-06-10 NOTE — Progress Notes (Signed)
Subjective:  No complaints, minimal leg pain, no back or abdominal pain. Mild dizziness, appears pale today. No new abdominal or back pain. Leg pain remains stable and controlled on her available PRN medications.   Objective:  Vital signs in last 24 hours: Vitals:   06/09/16 2300 06/09/16 2315 06/09/16 2343 06/10/16 0600  BP: (!) 107/39 (!) 107/26 (!) 102/31 (!) 103/45  Pulse: 82 88 93 73  Resp: (!) 8 13 14 16   Temp:  97.7 F (36.5 C) 98.4 F (36.9 C) 98.6 F (37 C)  TempSrc:   Axillary Oral  SpO2: 98% 100% 100% 100%  Weight:      Height:       General: Obese woman resting in bed in no acute distress HENT: Atraumatic, moist mucous membranes, conjunctival pallor present Cardiovascular: Regular rate, rhythm, no murmur appreciated  Pulmonary/Chest: Clear to auscultation bilaterally, no wheezes, rales, or rhonchi. Abdominal: Obese, soft, non-tender, non-distended, BS + Extremities: Left BKA and leg wrapped in ACE bandage, tenderness with light touch to left distal femur, RLE with 1+ pitting edema Skin: Warm, dry, intact, pale, no visible ecchymoses over back, abdomen, legs Neuro: A&O x 3  Dg Knee 1-2 Views Left  Result Date: 06/09/2016 CLINICAL DATA:  Closed reduction, cannulated screw/pinning of left femur EXAM: DG C-ARM 61-120 MIN; LEFT KNEE - 1-2 VIEW COMPARISON:  CT from 06/08/2016, radiographs 06/07/2016 FINDINGS: A total of 2 minutes 28 seconds of fluoroscopic time was utilized for placement of 3 cannulated screws and 2 pins involving a distal diaphyseal fracture of the femur. Improved alignment. IMPRESSION: Closed reduction with improved alignment of supracondylar left femoral fracture. Three cannulated screws and 2 pins traverse the site of fracture. Electronically Signed   By: Tollie Ethavid  Kwon M.D.   On: 06/09/2016 23:20   Dg Knee Left Port  Result Date: 06/09/2016 CLINICAL DATA:  Status post surgery of closed bicondylar fracture of distal femur. EXAM: PORTABLE LEFT KNEE - 1-2  VIEW COMPARISON:  Intraoperative fluoroscopic images of the left knee from earlier same day. FINDINGS: Again noted are the 3 cannulated screws and 2 pins traversing the distal femur fracture site, grossly stable in position compared to the intraoperative fluoroscopic images from earlier today. There is improved alignment at the femoral fracture site compared to the preoperative plain film of 06/08/2016. IMPRESSION: Status post closed reduction with improved alignment at the distal left femur fracture site, status post placement of cannulated screws and 2 pins which traverse the fracture site. No evidence of surgical complicating feature. Electronically Signed   By: Bary RichardStan  Maynard M.D.   On: 06/09/2016 23:51   Dg C-arm 1-60 Min  Result Date: 06/09/2016 CLINICAL DATA:  Closed reduction, cannulated screw/pinning of left femur EXAM: DG C-ARM 61-120 MIN; LEFT KNEE - 1-2 VIEW COMPARISON:  CT from 06/08/2016, radiographs 06/07/2016 FINDINGS: A total of 2 minutes 28 seconds of fluoroscopic time was utilized for placement of 3 cannulated screws and 2 pins involving a distal diaphyseal fracture of the femur. Improved alignment. IMPRESSION: Closed reduction with improved alignment of supracondylar left femoral fracture. Three cannulated screws and 2 pins traverse the site of fracture. Electronically Signed   By: Tollie Ethavid  Kwon M.D.   On: 06/09/2016 23:20   Assessment/Plan:  Principal Problem:   Retroperitoneal fluid collection Active Problems:   ESRD (end stage renal disease) on dialysis [facility](HCC)   Hypertension   Refusal of blood transfusions as patient is Jehovah's Witness   Compression fracture of L4 lumbar vertebra (HCC)   Closed  left femoral fracture (HCC)   Closed bicondylar fracture of distal femur, left, initial encounter (HCC)  Left distal femur fracture (fragility), L4 compression fracture vs possible osteomyelitis:  Pt presents after mechanical fall with left knee pain and back pain. CT pelvis showed left  distal femur fracture and L4 compression fracture vs osteomyelitis. Orthopedic consulted, as well as neurosurgery. Questionable right psoas abscess also seen on imaging. Pt is also Jehovas witness. She does not seem systemically ill but she is at risk for transient bacteremia given hemodialysis. She is not immunosuppressed. May need bone biopsy to distinguish osteo from compression fractures. Clinical history not impressive for osteomyelitis, pain is acute, patient afebrile, without leukocytosis. Underwent closed reduction under anesthesia on 1/15.  - consulted ortho, appreciate recs - consulted ID, appreciate recs - will follow psoas fluid aspirate culture results, observe and hold antibiotics currently - NGTD -Norco 1-2 tabs q6 hours -Dilaudid 1 mg q4 hours PRN -follow blood cultures - NGTD  Right psoas hematoma: Found on imaging and initially concerning for abscess. IR consulted and pt underwent CT-guided needle aspiration on 1/14 which revealed grossly bloody fluid more consistent with hematoma. Hb 6.9 from 7.7 from 10 on admission, received some IV fluids on 1/14 and underwent closed reduction on 1/15. Hemoglobin drop concerning for retroperitoneal hemorrhage or increasing hematoma size. No obvious bruising or bleeding on exam.  - Repeat CT A/P non contrast today for eval for bleeding source - not worsening - trend CBC  ESRD MWF - nephrology following - dialysis inpatient 1/15  Constipation, 2/2 opioid medications - Resumed home Dulcolax  Dispo: Anticipated discharge in approximately 2-3 day(s).   Althia Forts, MD 06/10/2016, 11:49 AM

## 2016-06-11 DIAGNOSIS — M7981 Nontraumatic hematoma of soft tissue: Secondary | ICD-10-CM

## 2016-06-11 DIAGNOSIS — S728X2D Other fracture of left femur, subsequent encounter for closed fracture with routine healing: Secondary | ICD-10-CM

## 2016-06-11 LAB — CBC
HCT: 21.2 % — ABNORMAL LOW (ref 36.0–46.0)
Hemoglobin: 6.8 g/dL — CL (ref 12.0–15.0)
MCH: 31.6 pg (ref 26.0–34.0)
MCHC: 32.1 g/dL (ref 30.0–36.0)
MCV: 98.6 fL (ref 78.0–100.0)
PLATELETS: 206 10*3/uL (ref 150–400)
RBC: 2.15 MIL/uL — AB (ref 3.87–5.11)
RDW: 16.2 % — AB (ref 11.5–15.5)
WBC: 6.8 10*3/uL (ref 4.0–10.5)

## 2016-06-11 LAB — RENAL FUNCTION PANEL
Albumin: 2.5 g/dL — ABNORMAL LOW (ref 3.5–5.0)
Anion gap: 13 (ref 5–15)
BUN: 36 mg/dL — AB (ref 6–20)
CALCIUM: 8.2 mg/dL — AB (ref 8.9–10.3)
CHLORIDE: 94 mmol/L — AB (ref 101–111)
CO2: 24 mmol/L (ref 22–32)
CREATININE: 5.36 mg/dL — AB (ref 0.44–1.00)
GFR calc Af Amer: 9 mL/min — ABNORMAL LOW (ref >60)
GFR calc non Af Amer: 7 mL/min — ABNORMAL LOW (ref >60)
Glucose, Bld: 100 mg/dL — ABNORMAL HIGH (ref 65–99)
Phosphorus: 5.7 mg/dL — ABNORMAL HIGH (ref 2.5–4.6)
Potassium: 4.2 mmol/L (ref 3.5–5.1)
SODIUM: 131 mmol/L — AB (ref 135–145)

## 2016-06-11 LAB — GLUCOSE, CAPILLARY: Glucose-Capillary: 104 mg/dL — ABNORMAL HIGH (ref 65–99)

## 2016-06-11 LAB — IRON AND TIBC
IRON: 49 ug/dL (ref 28–170)
Saturation Ratios: 30 % (ref 10.4–31.8)
TIBC: 161 ug/dL — ABNORMAL LOW (ref 250–450)
UIBC: 112 ug/dL

## 2016-06-11 MED ORDER — OXYCODONE-ACETAMINOPHEN 5-325 MG PO TABS
ORAL_TABLET | ORAL | Status: AC
  Administered 2016-06-11: 2 via ORAL
  Filled 2016-06-11: qty 2

## 2016-06-11 MED ORDER — DARBEPOETIN ALFA 100 MCG/0.5ML IJ SOSY
PREFILLED_SYRINGE | INTRAMUSCULAR | Status: AC
  Administered 2016-06-11: 100 ug via INTRAVENOUS
  Filled 2016-06-11: qty 0.5

## 2016-06-11 MED ORDER — ACETAMINOPHEN 325 MG PO TABS
ORAL_TABLET | ORAL | Status: AC
Start: 2016-06-11 — End: 2016-06-11
  Administered 2016-06-11: 650 mg via ORAL
  Filled 2016-06-11: qty 2

## 2016-06-11 NOTE — Progress Notes (Signed)
PT Cancellation Note  Patient Details Name: [name] MRN: [id number] DOB: 05/07/1946   Cancelled Treatment:    Reason Eval/Treat Not Completed: Patient at procedure or test/unavailable   Currently in HD;  Will follow up later today as time allows;  Otherwise, will follow up for PT tomorrow;   Thank you,  Van ClinesHolly Velinda Wrobel, PT  Acute Rehabilitation Services Pager [phone] Office [phone]     Tina AlandHolly H Swan [name] 06/11/2016, 10:21 AM

## 2016-06-11 NOTE — Progress Notes (Signed)
Subjective: [name] is still felling a somewhat dizzy, but does not have any acute changes in pain or other new symptoms.  She lives with a roommate at home and was ok with a short-term stay at a SNF for recovery given her recent fall.   Objective: Vital signs in last 24 hours: Vitals:   06/10/16 0600 06/10/16 1500 06/10/16 2116 06/11/16 0600  BP: (!) 103/45 (!) 92/43 (!) 91/32 (!) 111/57  Pulse: 73 (!) 59 62 84  Resp: 16 16 16 18   Temp: 98.6 F (37 C) 99.7 F (37.6 C) 98.1 F (36.7 C) 98.2 F (36.8 C)  TempSrc: Oral Oral Oral Oral  SpO2: 100% 100% 100% 100%  Weight:      Height:       Weight change:   Intake/Output Summary (Last 24 hours) at 06/11/16 0714 Last data filed at 06/10/16 0900  Gross per 24 hour  Intake              200 ml  Output                0 ml  Net              200 ml   Physical Exam Gen: Well-appearing in NAD HEENT: NCAT. EOMI. Moist mucous membranes.  CV: RRR with no murmurs, rubs, or gallops. Pulm:  CTAB, no wheezes or rhonchi. Abdomen: Soft, non-tender to palpation in four quadrants.  Normal BS appreciated.  Extremities: L BKA with wrapped and on ice. No ecchymosis on LLE.  No LE edema.  Pulses in all extremities intact.  Lab Results: CBC Latest Ref Rng & Units 06/11/2016 06/10/2016 06/09/2016  WBC 4.0 - 10.5 K/uL 6.8 6.3 6.3  Hemoglobin 12.0 - 15.0 g/dL 6.8(LL) 6.9(LL) 7.7(L)  Hematocrit 36.0 - 46.0 % 21.2(L) 21.3(L) 23.2(L)  Platelets 150 - 400 K/uL 206 159 164  MCV - 98.6 ; RDW 16.2  BMP Latest Ref Rng & Units 06/11/2016 06/10/2016 06/09/2016  Glucose 65 - 99 mg/dL 161(W) 960(A) 77  BUN 6 - 20 mg/dL 54(U) 98(J) 19(J)  Creatinine 0.44 - 1.00 mg/dL 4.78(G) 9.56(O) 1.30(Q)  Sodium 135 - 145 mmol/L 131(L) 134(L) 133(L)  Potassium 3.5 - 5.1 mmol/L 4.2 3.8 5.0  Chloride 101 - 111 mmol/L 94(L) 97(L) 92(L)  CO2 22 - 32 mmol/L 24 26 22   Calcium 8.9 - 10.3 mg/dL 8.2(L) 8.0(L) 8.5(L)    Micro Results: Recent Results (from the past 240 hour(s))    Surgical PCR screen     Status: Abnormal   Collection Time: 06/08/16  1:19 AM  Result Value Ref Range Status   MRSA, PCR POSITIVE (A) NEGATIVE Final    Comment: RESULT CALLED TO, READ BACK BY AND VERIFIED WITH:  TO N(ANDERSON) BY TCLEVELAND 06/08/2016 AT 0439    Staphylococcus aureus POSITIVE (A) NEGATIVE Final    Comment:        The Xpert SA Assay (FDA approved for NASAL specimens in patients over 23 years of age), is one component of a comprehensive surveillance program.  Test performance has been validated by [facility] for patients greater than or equal to 73 year old. It is not intended to diagnose infection nor to guide or monitor treatment.   Culture, blood (Routine X 2) w Reflex to ID Panel     Status: None (Preliminary result)   Collection Time: 06/08/16  8:01 AM  Result Value Ref Range Status   Specimen Description BLOOD LEFT ANTECUBITAL  Final   Special  Requests IN PEDIATRIC BOTTLE 2CC  Final   Culture NO GROWTH 2 DAYS  Final   Report Status PENDING  Incomplete  Culture, blood (Routine X 2) w Reflex to ID Panel     Status: None (Preliminary result)   Collection Time: 06/08/16  8:08 AM  Result Value Ref Range Status   Specimen Description BLOOD LEFT ANTECUBITAL  Final   Special Requests IN PEDIATRIC BOTTLE 2.0CC  Final   Culture NO GROWTH 2 DAYS  Final   Report Status PENDING  Incomplete  Aerobic/Anaerobic Culture (surgical/deep wound)     Status: None (Preliminary result)   Collection Time: 06/08/16  4:31 PM  Result Value Ref Range Status   Specimen Description DRAINAGE  Final   Special Requests DEEP SURGICAL WOUND  Final   Gram Stain   Final    FEW WBC PRESENT, PREDOMINANTLY PMN NO ORGANISMS SEEN    Culture   Final    NO GROWTH 2 DAYS NO ANAEROBES ISOLATED; CULTURE IN PROGRESS FOR 5 DAYS   Report Status PENDING  Incomplete   Studies/Results: Ct Abdomen Pelvis Wo Contrast  Result Date: 06/10/2016 CLINICAL DATA:  Nontraumatic psoas hematoma. Status post  aspiration of paraspinal abscess 06/09/2015. EXAM: CT ABDOMEN AND PELVIS WITHOUT CONTRAST TECHNIQUE: Multidetector CT imaging of the abdomen and pelvis was performed following the standard protocol without IV contrast. COMPARISON:  09/28/2014 FINDINGS: Lower chest: Subsegmental atelectasis. Extensive atherosclerosis. Minimal oral contrast in the lower esophagus which is nondilated. Hepatobiliary: No focal liver abnormality.Stable mild prominence of the biliary tree. Pancreas: Generalized atrophy. Spleen: Unremarkable. Adrenals/Urinary Tract: Negative adrenals. End-stage renal disease with symmetric smooth atrophy. Arterial calcification diffusely without suspected stone. Essentially stable 12 mm hyperdense lesion in the upper pole right kidney, likely hemorrhagic or proteinaceous cyst. Unremarkable bladder. Stomach/Bowel: No obstruction. No noted obstruction or inflammation. No pericecal edema. Vascular/Lymphatic: Diffuse atherosclerotic calcification. No acute vascular finding. No mass or adenopathy. Reproductive:Hysterectomy with negative adnexae. Other: High-density in the expanded right psoas consistent with hematoma. Swelling is mildly decreased from CT 06/07/2016. Psoas gas is also decreased, which is contiguous with probable vacuum phenomenon in the fractured L4 vertebra. No new or progressive hemorrhage noted. Musculoskeletal: L4 compression fracture with moderate height loss. Vacuum phenomenon within the fracture cleft as seen previously. Retropulsion was high-grade spinal stenosis, unchanged. New left gluteal muscular and subcutaneous edema, likely tracking from the distal left femur fracture site and repair. No new osseous abnormality. Spondylosis with multilevel lower thoracic ankylosis. IMPRESSION: 1. Right psoas hematoma and emphysema has decreased since 06/07/2016. No new or progressive hemorrhage. 2. Known L4 compression fracture with moderate height loss. Stable retropulsion and high-grade spinal  stenosis. 3. New edema in the left gluteal region attributed to left femur fracture and repair. 4. Chronic findings described above. Electronically Signed   By: Marnee Spring M.D.   On: 06/10/2016 13:00   Dg Knee 1-2 Views Left  Result Date: 06/09/2016 CLINICAL DATA:  Closed reduction, cannulated screw/pinning of left femur EXAM: DG C-ARM 61-120 MIN; LEFT KNEE - 1-2 VIEW COMPARISON:  CT from 06/08/2016, radiographs 06/07/2016 FINDINGS: A total of 2 minutes 28 seconds of fluoroscopic time was utilized for placement of 3 cannulated screws and 2 pins involving a distal diaphyseal fracture of the femur. Improved alignment. IMPRESSION: Closed reduction with improved alignment of supracondylar left femoral fracture. Three cannulated screws and 2 pins traverse the site of fracture. Electronically Signed   By: Tollie Eth M.D.   On: 06/09/2016 23:20   Dg Knee Left  Port  Result Date: 06/09/2016 CLINICAL DATA:  Status post surgery of closed bicondylar fracture of distal femur. EXAM: PORTABLE LEFT KNEE - 1-2 VIEW COMPARISON:  Intraoperative fluoroscopic images of the left knee from earlier same day. FINDINGS: Again noted are the 3 cannulated screws and 2 pins traversing the distal femur fracture site, grossly stable in position compared to the intraoperative fluoroscopic images from earlier today. There is improved alignment at the femoral fracture site compared to the preoperative plain film of 06/08/2016. IMPRESSION: Status post closed reduction with improved alignment at the distal left femur fracture site, status post placement of cannulated screws and 2 pins which traverse the fracture site. No evidence of surgical complicating feature. Electronically Signed   By: Bary Richard M.D.   On: 06/09/2016 23:51   Dg C-arm 1-60 Min  Result Date: 06/09/2016 CLINICAL DATA:  Closed reduction, cannulated screw/pinning of left femur EXAM: DG C-ARM 61-120 MIN; LEFT KNEE - 1-2 VIEW COMPARISON:  CT from 06/08/2016,  radiographs 06/07/2016 FINDINGS: A total of 2 minutes 28 seconds of fluoroscopic time was utilized for placement of 3 cannulated screws and 2 pins involving a distal diaphyseal fracture of the femur. Improved alignment. IMPRESSION: Closed reduction with improved alignment of supracondylar left femoral fracture. Three cannulated screws and 2 pins traverse the site of fracture. Electronically Signed   By: Tollie Eth M.D.   On: 06/09/2016 23:20   Medications: I have reviewed the patient's current medications. Scheduled Meds: . aspirin  81 mg Oral Daily  . Chlorhexidine Gluconate Cloth  6 each Topical Q0600  . darbepoetin (ARANESP) injection - DIALYSIS  100 mcg Intravenous Q Wed-HD  . docusate sodium  100 mg Oral Daily  . lanthanum  500 mg Oral TID WC  . levothyroxine  112 mcg Oral QAC breakfast  . metoprolol succinate  25 mg Oral Daily  . multivitamin  1 tablet Oral QHS  . mupirocin ointment  1 application Nasal BID  . pravastatin  20 mg Oral q1800   Continuous Infusions: PRN Meds:.acetaminophen **OR** acetaminophen, HYDROmorphone (DILAUDID) injection, metoCLOPramide **OR** metoCLOPramide (REGLAN) injection, ondansetron **OR** ondansetron (ZOFRAN) IV, oxyCODONE-acetaminophen Assessment/Plan: Principal Problem:   Retroperitoneal fluid collection Active Problems:   ESRD (end stage renal disease) on dialysis (HCC)   Hypertension   Refusal of blood transfusions as patient is Jehovah's Witness   Compression fracture of L4 lumbar vertebra (HCC)   Closed left femoral fracture (HCC)   Closed bicondylar fracture of distal femur, left, initial encounter (HCC)   Anemia  Normocytic anemia: Hgb 6.8 from 10.5 on admission. Stable from yesterday. Possible combination of ACD, dilutional, and old acute blood loss anemia. Pt is a Jehovah's witness and will not accept blood products. CT scan Abd/Pelvis shows psoas hematoma resolving.  LLE w/o skin changes or obvious swelling. - follow CBC -  EPO today with  dialysis  Right psoas hematoma: CT scan today shows resolving from CT scan where incidentally found 1/13. IR drainage of collection revealed sanguinous fluid consistent hematoma . Afebrile with WBC wnl, but is higher risk for bacteremia given her hemodialysis for ESRD. - follow blood and paraspinal fluid cultures - will start abx with pos results or signs of infx - consulted ID  L4 compression fracture: Likely compression fracture without osteomyelitis due to lack of infectious symptoms/back pain consistent with osteomyelitis. MRI showed compression that could not rule out osteomyelitis. - will continue to follow cultures - consulted ID  Left distal femur fracture: S/p closed reduction 1/15.  Pt had  mechanical fall while transferring. Pain controled with PRN percocet. - orthopedics consulted  - percocet 1-2 tabs q4 PRN - dilaudid 2mg  PRN for pain  ESRD on HD: MWF - renal consult for dialysis tomorrow  Disposition: D/c to SNF for rehab  This is a Psychologist, occupationalMedical Student Note.  The care of the patient was discussed with Dr. Laural BenesJohnson and the assessment and plan formulated with their assistance.  Please see their attached note for official documentation of the daily encounter.   LOS: 4 days   Sandrea Matteaniel S Ozias Dicenzo, Medical Student 06/11/2016, 7:14 AM

## 2016-06-11 NOTE — Progress Notes (Signed)
Patient arrived to unit per bed.  Reviewed treatment plan and this RN agrees.  Report received from bedside RN, Joradn.  Consent verified.  Patient A & O X 4. Lung sounds clear to ausculation in all fields. Generalized edema. Cardiac: NSR.  Prepped RUAVF with alcohol and cannulated with two 15 gauge needles.  Pulsation of blood noted.  Flushed access well with saline per protocol.  Connected and secured lines and initiated tx at 0840.  UF goal of 500 mL and net fluid removal of 0 mL.  Will continue to monitor.

## 2016-06-11 NOTE — Progress Notes (Signed)
Subjective:  Visited Ms. Beg in dialysis this morning. She has no new complaints, no abdominal pain, continues to experience mild dizziness. Hb stabilized at 6.8. She will likely require SNF placement as she lives alone and has significant nursing needs and poor mobility which led to this recent fall and femur fracture at home.   Objective:  Vital signs in last 24 hours: Vitals:   06/10/16 0600 06/10/16 1500 06/10/16 2116 06/11/16 0600  BP: (!) 103/45 (!) 92/43 (!) 91/32 (!) 111/57  Pulse: 73 (!) 59 62 84  Resp: 16 16 16 18   Temp: 98.6 F (37 C) 99.7 F (37.6 C) 98.1 F (36.7 C) 98.2 F (36.8 C)  TempSrc: Oral Oral Oral Oral  SpO2: 100% 100% 100% 100%  Weight:      Height:       General: Obese woman resting in bed in no acute distress HENT: Atraumatic, moist mucous membranes, conjunctival pallor present Cardiovascular: Regular rate, rhythm, no murmur appreciated  Pulmonary/Chest: Clear to auscultation bilaterally, no wheezes, rales, or rhonchi. Abdominal: Obese, soft, non-tender, non-distended, BS + Extremities: Left BKA and leg wrapped in bulky ACE bandage, non tender, RLE with 1+ pitting edema Skin: Warm, dry, intact, pale, no visible ecchymoses over back, abdomen, legs Neuro: A&O x 3  Ct Abdomen Pelvis Wo Contrast  Result Date: 06/10/2016 CLINICAL DATA:  Nontraumatic psoas hematoma. Status post aspiration of paraspinal abscess 06/09/2015. EXAM: CT ABDOMEN AND PELVIS WITHOUT CONTRAST TECHNIQUE: Multidetector CT imaging of the abdomen and pelvis was performed following the standard protocol without IV contrast. COMPARISON:  09/28/2014 FINDINGS: Lower chest: Subsegmental atelectasis. Extensive atherosclerosis. Minimal oral contrast in the lower esophagus which is nondilated. Hepatobiliary: No focal liver abnormality.Stable mild prominence of the biliary tree. Pancreas: Generalized atrophy. Spleen: Unremarkable. Adrenals/Urinary Tract: Negative adrenals. End-stage renal disease  with symmetric smooth atrophy. Arterial calcification diffusely without suspected stone. Essentially stable 12 mm hyperdense lesion in the upper pole right kidney, likely hemorrhagic or proteinaceous cyst. Unremarkable bladder. Stomach/Bowel: No obstruction. No noted obstruction or inflammation. No pericecal edema. Vascular/Lymphatic: Diffuse atherosclerotic calcification. No acute vascular finding. No mass or adenopathy. Reproductive:Hysterectomy with negative adnexae. Other: High-density in the expanded right psoas consistent with hematoma. Swelling is mildly decreased from CT 06/07/2016. Psoas gas is also decreased, which is contiguous with probable vacuum phenomenon in the fractured L4 vertebra. No new or progressive hemorrhage noted. Musculoskeletal: L4 compression fracture with moderate height loss. Vacuum phenomenon within the fracture cleft as seen previously. Retropulsion was high-grade spinal stenosis, unchanged. New left gluteal muscular and subcutaneous edema, likely tracking from the distal left femur fracture site and repair. No new osseous abnormality. Spondylosis with multilevel lower thoracic ankylosis. IMPRESSION: 1. Right psoas hematoma and emphysema has decreased since 06/07/2016. No new or progressive hemorrhage. 2. Known L4 compression fracture with moderate height loss. Stable retropulsion and high-grade spinal stenosis. 3. New edema in the left gluteal region attributed to left femur fracture and repair. 4. Chronic findings described above. Electronically Signed   By: Marnee SpringJonathon  Watts M.D.   On: 06/10/2016 13:00   Assessment/Plan:  Principal Problem:   Retroperitoneal fluid collection Active Problems:   ESRD (end stage renal disease) on dialysis [facility](HCC)   Hypertension   Refusal of blood transfusions as patient is Jehovah's Witness   Compression fracture of L4 lumbar vertebra (HCC)   Closed left femoral fracture (HCC)   Closed bicondylar fracture of distal femur, left, initial  encounter (HCC)   Anemia  Left distal femur fracture (fragility), L4  compression fracture:  Pt presents after mechanical fall with left knee pain and back pain. CT pelvis showed left distal femur fracture and L4 compression fracture vs osteomyelitis. Ortho consulted, as well as neurosurgery. Adjacent fluid collection aspirated and consistent with hematoma. Clinical history not impressive for osteomyelitis, pain is acute, patient afebrile, without leukocytosis. Underwent closed reduction under anesthesia on 1/15.  - consulted ortho, appreciate recs - consulted ID, appreciate recs - will follow psoas fluid aspirate culture results, observe and hold antibiotics currently - NGTD - Norco 1-2 tabs q6 hours - DC Dilaudid 1 mg q4 hours PRN - follow blood cultures - NGTD  Right psoas hematoma: Found on imaging and initially concerning for abscess. IR consulted and pt underwent CT-guided needle aspiration on 1/14 which revealed grossly bloody fluid more consistent with hematoma. Hb 6.9 from 7.7 from 10 on admission, received some IV fluids on 1/14 and underwent closed reduction on 1/15. Hemoglobin drop concerning for retroperitoneal hemorrhage or increasing hematoma size. No obvious bruising or bleeding on exam. CT AP on 1/16 showed stable/resolving psoas hematoma. Hb stable approx 6.8-6.9.  - trend CBC - follow aspirate culture - NGTD  ESRD MWF - nephrology following - dialysis inpatient 1/15 and 1/17  Constipation, 2/2 opioid medications - Resumed home Dulcolax  PT/OT recommending CIR, insurance/[facility]ACE will not cover this. Will require SNF placement. CSW consulted  Dispo: Anticipated discharge in 0-1 days.  Althia Forts, MD 06/11/2016, 7:32 AM

## 2016-06-11 NOTE — Progress Notes (Signed)
CRITICAL VALUE ALERT  Critical value received:  Hemoglobin 6.8  Date of notification:  06/11/2016  Time of notification:  0611  Critical value read back:Yes.    Nurse who received alert:  [place] Zade Falkner   MD notified (1st page):  Althia FortsAdam Johnson MD - [phone]  Time of first page:  [phone]  MD notified (2nd page):  Time of second page:  Responding MD:  Samuella CotaSvalina   Time MD responded:  [email or web address] MD said to continue to monitor Pt's signs and symptoms.

## 2016-06-11 NOTE — Progress Notes (Signed)
    [facility]   Reason for visit: Follow up on psoas fluid collection  Interval History: CT with improved area at psoas hematoma  Physical Exam: Constitutional:  Vitals:   06/11/16 1200 06/11/16 1230  BP: (!) 109/54 (!) 131/46  Pulse: 89 88  Resp:  18  Temp:     patient appears in NAD  Impression: Stable hematoma; not consistent with infection  Plan: 1.  No antibiotics indicated  I will sign off, please call with questions. thanks

## 2016-06-11 NOTE — Procedures (Signed)
I have seen and examined this patient and agree with the plan of care  BP 101/52    Ca 8.2  Phos 5.7   Alb 2.5  Hb 6.8    Darbepoietin 100   Jehovah Witness    no blood  .  Robinette Esters W 06/11/2016, 8:57 AM

## 2016-06-12 ENCOUNTER — Encounter (HOSPITAL_COMMUNITY): Payer: Self-pay

## 2016-06-12 DIAGNOSIS — S72492D Other fracture of lower end of left femur, subsequent encounter for closed fracture with routine healing: Secondary | ICD-10-CM

## 2016-06-12 LAB — CBC
HEMATOCRIT: 21.7 % — AB (ref 36.0–46.0)
HEMOGLOBIN: 6.9 g/dL — AB (ref 12.0–15.0)
MCH: 31.7 pg (ref 26.0–34.0)
MCHC: 31.8 g/dL (ref 30.0–36.0)
MCV: 99.5 fL (ref 78.0–100.0)
Platelets: 199 10*3/uL (ref 150–400)
RBC: 2.18 MIL/uL — AB (ref 3.87–5.11)
RDW: 16.2 % — ABNORMAL HIGH (ref 11.5–15.5)
WBC: 6.8 10*3/uL (ref 4.0–10.5)

## 2016-06-12 LAB — GLUCOSE, CAPILLARY
Glucose-Capillary: 95 mg/dL (ref 65–99)
Glucose-Capillary: 98 mg/dL (ref 65–99)

## 2016-06-12 MED ORDER — SENNA 8.6 MG PO TABS: 1.0000 | ORAL_TABLET | Freq: Every day | ORAL | Status: DC

## 2016-06-12 MED ORDER — OXYCODONE-ACETAMINOPHEN 5-325 MG PO TABS: 1.0000 | ORAL_TABLET | Freq: Four times a day (QID) | ORAL | 0 refills | Status: DC | PRN

## 2016-06-12 MED ORDER — POLYETHYLENE GLYCOL 3350 17 G PO PACK: 17.0000 g | PACK | Freq: Every day | ORAL | 0 refills | Status: DC | PRN

## 2016-06-12 MED ORDER — METOPROLOL SUCCINATE ER 25 MG PO TB24: 12.5000 mg | ORAL_TABLET | Freq: Every day | ORAL | Status: DC

## 2016-06-12 MED ORDER — POLYETHYLENE GLYCOL 3350 17 G PO PACK: 17.0000 g | PACK | Freq: Every day | ORAL | Status: DC | PRN

## 2016-06-12 MED ORDER — LANTHANUM CARBONATE 500 MG PO CHEW: 500.0000 mg | CHEWABLE_TABLET | Freq: Three times a day (TID) | ORAL | 3 refills | Status: AC

## 2016-06-12 NOTE — Progress Notes (Signed)
Subjective:  tolerated HD  Yesterday[facility].  Objective Vital signs in last 24 hours: Vitals:   06/11/16 1230 06/11/16 1549 06/12/16 0100 06/12/16 0300  BP: (!) 131/46 (!) 120/51 (!) 122/50 (!) 124/54  Pulse: 88 87 65 82  Resp: 18 16 16 16   Temp:  98.6 F (37 C) 98.4 F (36.9 C) 98.8 F (37.1 C)  TempSrc:   Oral Oral  SpO2:  92% 92% 92%  Weight: 90.3 kg (199 lb 1.2 oz)     Height:       Weight change:   Physical Exam: General: alert , chronically Ill-appearing female, NAD Lungs: CTA bilaterally Breathing is unlabored. Heart:RRR, no rub or mur Abdomen: soft NT + BS Lower extremities:L BKA with dressing dry intact/ R LE with chronic venous type trace pedal edema  decreased from admit  Dialysis Access: RUE AVF +thrill/bruit   OP Dialysis Orders:  AF MWF 3h 45min BFR 400 2k/2/25 Ca Linear Na EDW 81 kg Heparin 8500 U IV bolus Hectorol 2 mcg IV 3x q week No ESA OP Labs: P 6.5 Ca 9.4 PTH 279   Problem/Plan: 1. ESRD- HD MWF - cont on schedule  yest . 2. L distal femur fracture=Sp -CLOSED REDUCTION, CANNULATED SCREWS PINNING L Femur 1/15. 3.   L4 compression fracture -  No osteo with ID sign off / Neg cultures no antibiotics  4. R psoas Hematoma-  CT showed resolution ID consulted  no antibiotics yet =No growth on cultures from  aspiration.   5. Hypertension/volume-  BP has been lowish = multiple factors  Sp surgery / drop hgb / pain meds / will decr  metoprolol 25mg  q day to 12.5 and give HS to help prevent bp drop on HD  / ,lungs clear and no sig.  pedal edema  /  BED WT = 79.6 (below op edw 81 ) on 1/15 and yest  90.3 ?? Had / no uf with hd yest .. ( Wts/ hard to access with bed wts ) will not change edw  For now when dc fu trnd at op center. 6. Anemia- Hgb 10.0 ->6.9> 6.9 this am   Post op  Theadore Nan/Jevhovah witness  DW PT she DOES NOT WANT ANY BLOOD  TRANSFUSIONS ,  /started aranesp 100 given 06/11/16 hd   /  Was on no OP ESA   7. Metabolic bone disease- OP Ca/PTH at goal - no binder on med list -   Phos7.9> 8.9 > 5.5 >5.7 / Corec Ca 9.4 now on Fosrenol  8. Nutrition- renal diet/vitamins  9. Hypothyroidism- on Synthroid  10. Depression- baseline /again good mood this am    Lenny Pastelavid Gillermo Poch, PA-C [facility] [phone] 06/12/2016,10:45 AM  LOS: 5 days   Labs: Basic Metabolic Panel:  Recent Labs Lab 06/09/16 1220 06/10/16 0906 06/11/16 0406  NA 133* 134* 131*  K 5.0 3.8 4.2  CL 92* 97* 94*  CO2 22 26 24   GLUCOSE 77 109* 100*  BUN 51* 24* 36*  CREATININE 7.04* 4.16* 5.36*  CALCIUM 8.5* 8.0* 8.2*  PHOS 8.9* 5.5* 5.7*   Liver Function Tests:  Recent Labs Lab 06/09/16 1220 06/10/16 0906 06/11/16 0406  ALBUMIN 2.4* 2.5* 2.5*   No results for input(s): LIPASE, AMYLASE in the last 168 hours. No results for input(s): AMMONIA in the last 168 hours. CBC:  Recent Labs Lab 06/07/16 1816 06/08/16 0511 06/09/16 1227 06/10/16 0906 06/11/16 0406 06/12/16 0336  WBC 7.1  7.4 6.3 6.3 6.8 6.8  NEUTROABS 5.6  --   --   --   --   --   HGB 10.5* 10.0* 7.7* 6.9* 6.8* 6.9*  HCT 32.3* 31.3* 23.2* 21.3* 21.2* 21.7*  MCV 97.3 97.8 96.7 99.1 98.6 99.5  PLT 184 156 164 159 206 199   Cardiac Enzymes:  Recent Labs Lab 06/08/16 1821 06/08/16 2307 06/09/16 0705  TROPONINI <0.03 <0.03 <0.03   CBG:  Recent Labs Lab 06/09/16 2258 06/10/16 0654 06/10/16 0750 06/10/16 0853 06/11/16 0628  GLUCAP 92 68 72 90 104*     Medications:  . aspirin  81 mg Oral Daily  . Chlorhexidine Gluconate Cloth  6 each Topical Q0600  . darbepoetin (ARANESP) injection - DIALYSIS  100 mcg Intravenous Q Wed-HD  . docusate sodium  100 mg Oral Daily  . lanthanum  500 mg Oral TID WC  . levothyroxine  112 mcg Oral QAC breakfast  . metoprolol succinate  25 mg Oral Daily  . multivitamin  1 tablet Oral QHS  . mupirocin ointment  1 application Nasal BID  . pravastatin  20 mg Oral q1800  . senna  1  tablet Oral QHS

## 2016-06-12 NOTE — Clinical Social Work Note (Signed)
Pt has a bed at [name] today. CSW spoke with [name] and they have approved pt to go to [name] as well as [facility]. CSW will set up transport once d/c summary and order are provided. RN notified. [address][name] [place], [place] [phone]

## 2016-06-12 NOTE — Progress Notes (Signed)
   Subjective:  [name] is feeling alright this morning. Continues to experience some dizziness and persistent pain in her bandaged left leg. Has noticed some swelling at her left hip. Hb stable at 6.9. Received Aranesp and iron with dialysis yesterday.  Objective:  Vital signs in last 24 hours: Vitals:   06/11/16 1230 06/11/16 1549 06/12/16 0100 06/12/16 0300  BP: (!) 131/46 (!) 120/51 (!) 122/50 (!) 124/54  Pulse: 88 87 65 82  Resp: 18 16 16 16   Temp:  98.6 F (37 C) 98.4 F (36.9 C) 98.8 F (37.1 C)  TempSrc:   Oral Oral  SpO2:  92% 92% 92%  Weight: 199 lb 1.2 oz (90.3 kg)     Height:       General: Obese woman resting in bed in no acute distress HENT: Atraumatic, moist mucous membranes, conjunctival pallor present Cardiovascular: Regular rate, rhythm, no murmur appreciated  Pulmonary/Chest: Clear to auscultation bilaterally, no wheezes, rales, or rhonchi. 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 Assessment/Plan:  Principal Problem:   Retroperitoneal fluid collection Active Problems:   ESRD (end stage renal disease) on dialysis (HCC)   Hypertension   Refusal of blood transfusions as patient is Jehovah's Witness   Compression fracture of L4 lumbar vertebra (HCC)   Closed left femoral fracture (HCC)   Closed bicondylar fracture of distal femur, left, initial encounter (HCC)   Anemia   Nontraumatic psoas hematoma  Left distal femur fracture (fragility), L4 compression fracture:  Pt presents after mechanical fall with left knee pain and back pain. CT pelvis showed left distal femur fracture and L4 compression fracture vs osteomyelitis. Ortho consulted, as well as neurosurgery. Adjacent fluid collection aspirated and consistent with hematoma.  Clinical history not impressive for osteomyelitis, pain is acute, patient afebrile, without leukocytosis. Underwent closed reduction under anesthesia on 1/15.  - consulted ortho, appreciate recs - no weight bearing left leg, will follow up visit 1/26-1/30 for suture removal - consulted ID, appreciate recs - will follow psoas fluid aspirate culture results, observe and hold antibiotics currently - NGTD - Norco 1-2 tabs q6 hours - follow blood cultures - NGTD  Right psoas hematoma, resolving Found on imaging and initially concerning for abscess. IR consulted and pt underwent CT-guided needle aspiration on 1/14 which revealed grossly bloody fluid more consistent with hematoma. Hb 6.9 from 7.7 from 10 on admission, received some IV fluids on 1/14 and underwent closed reduction on 1/15. Hemoglobin drop concerning for retroperitoneal hemorrhage or increasing hematoma size. No obvious bruising or bleeding on exam. CT AP on 1/16 showed stable/resolving psoas hematoma. Hb stable approx 6.8-6.9. Mild symptomat - follow aspirate culture - NGTD  Symptomatic anemia, Hb stable at 6.9, mild dizziness persists, likely ACD + resolving psoas hematoma and femur fracture - Aranesp and iron with dialysis - Trend CBC  ESRD MWF - nephrology following - dialysis inpatient 1/15 and 1/17  Constipation, 2/2 opioid medications - Resumed home Dulcolax - PRN Miralax - Senna QHS  PT/OT recommending CIR, insurance/PACE will not cover this. Will require SNF placement. CSW consulted  Dispo: Anticipated discharge in 0-1 days.  Tina FortsAdam Tamura Lasky, MD 06/12/2016, 10:12 AM

## 2016-06-12 NOTE — Clinical Social Work Note (Signed)
Clinical Social Worker facilitated patient discharge including contacting patient family and facility to confirm patient discharge plans.  Clinical information faxed to facility and family agreeable with plan.  CSW arranged ambulance transport via [facility] to [facility] .  RN to call [phone] for report prior to discharge.  Clinical Social Worker will sign off for now as social work intervention is no longer needed. Please consult us again if new need arises.  Neri Samek Mayton, LCSWA [phone]   

## 2016-06-12 NOTE — NC FL2 (Signed)
[place] MEDICAID FL2 LEVEL OF CARE SCREENING TOOL     IDENTIFICATION  Patient Name: Tina BouillonMaria C Mcbain Birthdate: 01/03/1946 Sex: female Admission Date (Current Location): 06/07/2016  [facility] [place]   Facility and Address:  [facility], [address], [place], [place] [address]      Provider Number: [id number]  Attending Physician Name and Address:  Cammy CopaScott Gregory Dean, MD  Relative Name and Phone Number:       Current Level of Care: Hospital Recommended Level of Care: Skilled Nursing Facility Prior Approval Number:    Date Approved/Denied: 06/12/16 PASRR Number: [id number]  Discharge Plan: SNF    Current Diagnoses: Patient Active Problem List   Diagnosis Date Noted  . Nontraumatic psoas hematoma   . Anemia   . Closed bicondylar fracture of distal femur, left, initial encounter (HCC) 06/09/2016  . Closed left femoral fracture (HCC) 06/08/2016  . Retroperitoneal fluid collection 06/08/2016  . Compression fracture of L4 lumbar vertebra (HCC) 06/07/2016  . Acute respiratory failure (HCC) 08/09/2012  . Altered mental status 08/09/2012  . Narcotic overdose 08/09/2012  . Hyperkalemia 08/09/2012  . 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 Peripheral vascular disease, unspecified 10/16/2011  . ESRD (end stage renal disease) on dialysis (HCC) 04/08/2011  . Diabetes mellitus type II 04/08/2011  . Diabetic retinopathy associated with type 2 diabetes mellitus (HCC) 04/08/2011  . GERD (gastroesophageal reflux disease) 04/08/2011  . CAD, multiple vessel 04/08/2011  . PAD (peripheral artery disease) (HCC) 04/08/2011  . CHF (congestive heart failure) (HCC) 04/08/2011  . Hypertension 04/08/2011  . Fibromyalgia 04/08/2011  . Hypothyroid 04/08/2011  . Anxiety 04/08/2011  . Major depression 04/08/2011  . Peripheral neuropathy (HCC) 04/08/2011  . Morphea 04/08/2011  . Osteoporosis 04/08/2011  .  Carpal tunnel syndrome on both sides 04/08/2011  . Refusal of blood transfusions as patient is Jehovah's Witness 04/08/2011  . Hyperparathyroidism, secondary (HCC) 04/08/2011  . Vitamin D deficiency 04/08/2011    Orientation RESPIRATION BLADDER Height & Weight     Self, Time, Situation, Place  Normal Continent Weight: 199 lb 1.2 oz (90.3 kg) Height:  5\' 8"  (172.7 cm)  BEHAVIORAL SYMPTOMS/MOOD NEUROLOGICAL BOWEL NUTRITION STATUS      Continent  (Please see d/c summary)  AMBULATORY STATUS COMMUNICATION OF NEEDS Skin   Extensive Assist Verbally Surgical wounds (Closed incision left leg, compression wrap)                       Personal Care Assistance Level of Assistance  Bathing, Feeding, Dressing Bathing Assistance: Maximum assistance Feeding assistance: Limited assistance Dressing Assistance: Maximum assistance     Functional Limitations Info  Sight, Hearing, Speech Sight Info: Adequate Hearing Info: Adequate Speech Info: Adequate    SPECIAL CARE FACTORS FREQUENCY  PT (By licensed PT), OT (By licensed OT)     PT Frequency: 4x week OT Frequency: 4x week            Contractures Contractures Info: Not present    Additional Factors Info  Code Status, Allergies, Isolation Precautions Code Status Info: DNR Allergies Info: Other, Pumpkin Seed Oil-saw Palmetto-zinc Propalmex, Shrimp Shellfish Allergy, Morphine And Related, Penicillins     Isolation Precautions Info: Contact; MRSA     Current Medications (06/12/2016):  This is the current hospital active medication list Current Facility-Administered Medications  Medication Dose Route Frequency Provider Last Rate Last Dose  . acetaminophen (TYLENOL) tablet 650 mg  650 mg Oral Q6H PRN Cammy Copa, MD   650 mg at 06/11/16 1110   Or  . acetaminophen (TYLENOL) suppository 650 mg  650 mg Rectal Q6H PRN Cammy Copa, MD      . aspirin chewable tablet 81 mg  81 mg Oral Daily Thomasene Lot, MD   81 mg at  06/12/16 0811  . Chlorhexidine Gluconate Cloth 2 % PADS 6 each  6 each Topical Q0600 Tyson Alias, MD   6 each at 06/12/16 0550  . Darbepoetin Alfa (ARANESP) injection 100 mcg  100 mcg Intravenous Q Wed-HD Lenny Pastel, PA-C   100 mcg at 06/11/16 1610  . docusate sodium (COLACE) capsule 100 mg  100 mg Oral Daily Althia Forts, MD   100 mg at 06/12/16 9604  . HYDROmorphone (DILAUDID) injection 1 mg  1 mg Intravenous Q4H PRN Althia Forts, MD      . lanthanum [facility]) chewable tablet 500 mg  500 mg Oral TID WC Tomasa Blase, PA-C   500 mg at 06/12/16 5409  . levothyroxine (SYNTHROID, LEVOTHROID) tablet 112 mcg  112 mcg Oral QAC breakfast Darreld Mclean, MD   112 mcg at 06/12/16 0811  . metoCLOPramide (REGLAN) tablet 5-10 mg  5-10 mg Oral Q8H PRN Cammy Copa, MD       Or  . metoCLOPramide (REGLAN) injection 5-10 mg  5-10 mg Intravenous Q8H PRN Cammy Copa, MD      . Melene Muller ON 06/13/2016] metoprolol succinate (TOPROL-XL) 24 hr tablet 12.5 mg  12.5 mg Oral QHS Lenny Pastel, PA-C      . multivitamin (RENA-VIT) tablet 1 tablet  1 tablet Oral QHS Tyson Alias, MD   1 tablet at 06/11/16 2213  . mupirocin ointment (BACTROBAN) 2 % 1 application  1 application Nasal BID Tyson Alias, MD   1 application at 06/11/16 2214  . ondansetron (ZOFRAN) tablet 4 mg  4 mg Oral Q6H PRN Cammy Copa, MD       Or  . ondansetron [facility]) injection 4 mg  4 mg Intravenous Q6H PRN Cammy Copa, MD      . oxyCODONE-acetaminophen (PERCOCET/ROXICET) 5-325 MG per tablet 1-2 tablet  1-2 tablet Oral Q6H PRN Althia Forts, MD   2 tablet at 06/12/16 [phone]  . polyethylene glycol (MIRALAX / GLYCOLAX) packet 17 g  17 g Oral Daily PRN Althia Forts, MD      . pravastatin (PRAVACHOL) tablet 20 mg  20 mg Oral q1800 Darreld Mclean, MD   20 mg at 06/11/16 1730  . senna (SENOKOT) tablet 8.6 mg  1 tablet Oral QHS Althia Forts, MD         Discharge Medications: Please see discharge  summary for a list of discharge medications.  Relevant Imaging Results:  Relevant Lab Results:   Additional Information SSN: [id number]  Volney American, LCSW

## 2016-06-12 NOTE — Discharge Summary (Signed)
Name: [name] MRN: [id number] DOB: [birth year] 72 y.o. PCP: Jethro Bastos, MD  Date of Admission: 06/07/2016  3:33 PM Date of Discharge: 06/12/2016 Attending Physician: Inez Catalina, MD  Discharge Diagnosis: 1. Closed left femoral fracture 2. Compression fracture of L4 lumbar vertebra 3. Psoas hematoma 4. Symptomatic anemia 5. ESRD on HD  Principal Problem:   Closed left femoral fracture (HCC) Active Problems:   ESRD (end stage renal disease) on dialysis (HCC)   Hypertension   Refusal of blood transfusions as patient is Jehovah's Witness   Compression fracture of L4 lumbar vertebra (HCC)   Closed bicondylar fracture of distal femur, left, initial encounter (HCC)   Anemia   Nontraumatic psoas hematoma   Discharge Medications: Allergies as of 06/12/2016      Reactions   Other Anaphylaxis   peaches   Pumpkin Seed Oil-saw Palmetto-zinc [propalmex] Anaphylaxis   Shrimp [shellfish Allergy] Anaphylaxis   Morphine And Related Other (See Comments)   Pt fell into deep sleep and could not be woken up had to be resuscitated says she did not overdose, unsure whether throat swelled or SOB pt does not remember   Penicillins    Tongue swelling.       Medication List    TAKE these medications   ALPRAZolam 0.25 MG tablet Commonly known as:  XANAX Take 0.25 mg by mouth 3 (three) times daily as needed for anxiety.   aspirin 81 MG tablet Take 81 mg by mouth daily.   calcium carbonate 500 MG chewable tablet Commonly known as:  TUMS - dosed in mg elemental calcium Chew 2 tablets by mouth 3 (three) times daily.   cetirizine 10 MG tablet Commonly known as:  ZYRTEC Take 10 mg by mouth daily.   clonazePAM 1 MG tablet Commonly known as:  KLONOPIN Take 1 mg by mouth at bedtime.    clopidogrel 75 MG tablet Commonly known as:  PLAVIX Take 75 mg by mouth daily. - HOLD UNTIL JAN 21st 2018   DAILY VITE PO Take 1 tablet by mouth daily.   diclofenac sodium 1 %  Gel Commonly known as:  VOLTAREN Apply 2 g topically 4 (four) times daily as needed (for pain).   diphenhydrAMINE 25 mg capsule Commonly known as:  BENADRYL Take 25 mg by mouth every 6 (six) hours as needed. As needed for allergies.   docusate sodium 100 MG capsule Commonly known as:  COLACE Take 100 mg by mouth 3 (three) times daily.   escitalopram 20 MG tablet Commonly known as:  LEXAPRO Take 20 mg by mouth daily.   folic acid-vitamin b complex-vitamin c-selenium-zinc 3 MG Tabs tablet Take 1 tablet by mouth daily.   hydrOXYzine 25 MG tablet Commonly known as:  ATARAX/VISTARIL Take 25 mg by mouth every 6 (six) hours as needed for itching.   lanthanum 500 MG chewable tablet Commonly known as:  FOSRENOL Chew 1 tablet (500 mg total) by mouth 3 (three) times daily with meals.   levothyroxine 100 MCG tablet Commonly known as:  SYNTHROID, LEVOTHROID Take 100 mcg by mouth daily.   loperamide 2 MG capsule Commonly known as:  IMODIUM Take 2 mg by mouth 4 (four) times daily as needed. As needed for loose stools.   lovastatin 20 MG tablet Commonly known as:  MEVACOR Take 20 mg by mouth at bedtime.   metoprolol succinate 25 MG 24 hr tablet Commonly known as:  TOPROL-XL Take 25 mg by mouth daily.   nortriptyline 25 MG capsule  Commonly known as:  PAMELOR Take 25 mg by mouth at bedtime.   oxyCODONE-acetaminophen 5-325 MG tablet Commonly known as:  PERCOCET/ROXICET Take 1-2 tablets by mouth every 6 (six) hours as needed for moderate pain or severe pain. What changed:  how much to take  reasons to take this   polyethylene glycol packet Commonly known as:  MIRALAX / GLYCOLAX Take 17 g by mouth daily as needed for mild constipation or moderate constipation.   promethazine 25 MG tablet Commonly known as:  PHENERGAN Take 25 mg by mouth every 6 (six) hours as needed for nausea or vomiting.   valsartan 80 MG tablet Commonly known as:  DIOVAN Take 80 mg by mouth daily.        Disposition and follow-up:   TinaTina [name] was discharged from [facility] in Stable condition.  At the hospital follow up visit please address:  1.  Closed left femoral fracture - assess symptoms and pain control, should remain non-weight bearing and follow up with ortho Dr. Dorene Grebe  Compression fracture of L4 vertebra - assess back pain, mobility, and optimize management of bone disease related to ESRD and/or osteoporosis  Psoas hematoma - assess for abdominal pain or bruising, hip flexor disability, Plavix held, plan to restart on 06/15/16  Symptomatic anemia - assess for persistent dizziness and other clinical signs/symptom of anemia, or recovery  ESRD on HD - assess adherence with dialysis MWF  2.  Labs / imaging needed at time of follow-up: CBC  3.  Pending labs/ test needing follow-up: None  Follow-up Appointments: Contact information for after-discharge care    Destination    [facility] .   Specialty:  Skilled Nursing Facility Contact information: [address] [place] [address] [phone]              Hospital Course by problem list: Principal Problem:   Closed left femoral fracture [facility]) Active Problems:   ESRD (end stage renal disease) on dialysis [facility])   Hypertension   Refusal of blood transfusions as patient is Jehovah's Witness   Compression fracture of L4 lumbar vertebra (HCC)   Closed bicondylar fracture of distal femur, left, initial encounter (HCC)   Anemia   Nontraumatic psoas hematoma   1.  Closed left femoral fracture  [name] is a 71 year old woman with PMH of ESRD on HD (MWF), HTN, HLD, T2DM, HFrEF (EF:35-40% ), PAD s/p left BKA, CAD (s/p MI and stents x4) who presented on 1/13 following a mechanical fall onto her left side while transferring from toilet to wheelchair at home. She is nonweightbearing with L BKA and chronic right leg weakness at baseline. She was found to  have distal femur fracture on plain films but initial imaging evaluation was suboptimal. CT on 1/15 revealed mildly comminuted mid diaphyseal fracture of the distal femur. Patient underwent closed reduction and percutaneous pinning under anesthesia on 1/15 via Dr. Dorene Grebe of [facility] orthopedics. Her leg pain was been well controlled with decreasing amounts of pain medications. Leg remains casted and wrapped, planned for ortho follow up in 10-14 days.   2. Compression fracture of L4 vertebra - Presented with new left hip and back pain s/p fall. CT imaging of the pelvis on admission to assess hip pain revealed new L4 compression deformity with concurrent gas within the vertebral body and gas/soft tissue fullness within the right psoas muscle was intitially concerning for osteomyelitis with adjacent psoas abscess. Lumbar MR without contrast  could not rule out osteomyelitis. Patient remained afebrile without leukocytosis, and infectious workup of the adjacent psoas-associated fluid collection was more consistent with hematoma. Infectious disease team evaluated patient, history, and imaging results and though osteomyelitis very unlikely. Blood cultures remained negative.  3. Psoas hematoma, right - incidental finding on imaging, initially concerning for abscess. Plavix was held during hospital stay. and patient underwent CT-guided needle aspiration on 1/14 which revealed grossly bloody fluid more consistent with hematoma. Aspirate was sent for culture and shows no growth to date. Declining hemoglobin count let to repeat imaging on 1/16 which revealed stable/resolving hematoma. Planned to restart Plavix on 06/15/16.  4. Symptomatic anemia - initial hemoglobin at 10.5, normocytic, anemia of chronic disease baseline for this ESRD patient. However it fell to 7.7 then 6.9 within 2-3 days during her hospital stay without any visible source of bleeding. Likely sources being dilutional, psoas hematoma (although was  resolving on repeat abdominal imaging), and likely her femur fracture. Patient developed persistent dizziness and mild fatigue. 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 He hemoglobin has remained stable at 6.9 for three days and she is receiving erythropoietin and iron infusions with dialysis.  5. ESRD on HD - followed by nephrology while admitted and underwent inpatient hemodialysis on 1/15 and 1/17 without complication  Discharge Vitals:   BP (!) 124/54 (BP Location: Left Arm)   Pulse 82   Temp 98.8 F (37.1 C) (Oral)   Resp 16   Ht 5\' 8"  (1.727 m)   Wt 199 lb 1.2 oz (90.3 kg)   SpO2 92%   BMI 30.27 kg/m   Pertinent Labs, Studies, and Procedures:  CBC Latest Ref Rng & Units 06/12/2016 06/11/2016 06/10/2016  WBC 4.0 - 10.5 K/uL 6.8 6.8 6.3  Hemoglobin 12.0 - 15.0 g/dL 6.9(LL) 6.8(LL) 6.9(LL)  Hematocrit 36.0 - 46.0 % 21.7(L) 21.2(L) 21.3(L)  Platelets 150 - 400 K/uL 199 206 159   BMP Latest Ref Rng & Units 06/11/2016 06/10/2016 06/09/2016  Glucose 65 - 99 mg/dL 409(W) 119(J) 77  BUN 6 - 20 mg/dL 47(W) 29(F) 62(Z)  Creatinine 0.44 - 1.00 mg/dL 3.08(M) 5.78(I) 6.96(E)  Sodium 135 - 145 mmol/L 131(L) 134(L) 133(L)  Potassium 3.5 - 5.1 mmol/L 4.2 3.8 5.0  Chloride 101 - 111 mmol/L 94(L) 97(L) 92(L)  CO2 22 - 32 mmol/L 24 26 22   Calcium 8.9 - 10.3 mg/dL 8.2(L) 8.0(L) 8.5(L)   PROCEDURE - 06/09/2016 CLOSED REDUCTION,  CANNULATED SCREWS PINNING SURGEON:  Surgeon(s): Cammy Copa, MD  Dg Knee 1-2 Views Left  Result Date: 06/07/2016 CLINICAL DATA:  Fall 3 hours ago.  Prior amputation. EXAM: LEFT KNEE - 1-2 VIEW COMPARISON:  02/11/2010 FINDINGS: Status post below-the-knee amputation. Extensive vascular calcifications. Suboptimal ans atypical patient positioning, especially on the second image. Osteopenia. Fracture of the distal femoral metaphysis with probable impaction and angulation. This is suboptimally evaluated on the AP images secondary  to positioning and altered anatomy. No dislocation. IMPRESSION: Distal femoral fracture, suboptimally evaluated secondary to chronic deformities and suboptimal positioning. Favored to be acute or subacute. CT could be informative to help delineate. Vascular calcifications. Electronically Signed   By: Jeronimo Greaves M.D.   On: 06/07/2016 17:31   Ct Pelvis Wo Contrast  Result Date: 06/07/2016 CLINICAL DATA:  Left hip pain after fall earlier today. EXAM: CT PELVIS WITHOUT CONTRAST TECHNIQUE: Multidetector CT imaging of the pelvis was performed following the standard protocol without intravenous contrast. COMPARISON:  Plain films of  the pelvis of 11/13/2009. Prior CT of 04/29/2005. FINDINGS: Soft tissues: Degradation secondary to beam hardening artifact from left hip arthroplasty. Scattered colonic diverticula. Mild motion degradation throughout. Normal terminal ileum and appendix. Normal small bowel caliber. Advanced distal aortic and pelvic atherosclerosis. No pelvic sidewall adenopathy. Probable hysterectomy. Limited evaluation the adnexa. No free intraperitoneal air. No free fluid. Bilateral fat containing inguinal hernias. Fat containing lateral pelvic wall hernia including on image 67/series 202. Musculoskeletal: Pelvic anasarca, without well-defined hematoma. Soft tissue fullness, heterogeneity, and gas within the right psoas muscle including on the order of 4.0 x 4.2 cm on image 26/ series 202. This is new since the CT of 2006. Also likely new since the abdominal CT of 2016. Osteopenia. Left hip arthroplasty. 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 Degenerative sclerosis of the bilateral sacroiliac joints. A moderate compression deformity involves the L4 vertebral body. There is gas within the vertebral body, including on sagittal image 84/series 206. Findings are new since 09/28/2014 abdominal study. Mild ventral canal encroachment. Advanced lumbosacral spondylosis. IMPRESSION: 1. Left hip arthroplasty, without  evidence of complicating fracture or dislocation. Please note given the extent of beam hardening artifact from arthroplasty and osteopenia, CT is relatively low sensitivity for acute fracture and the test of choice is pelvic MRI. 2. New L4 compression deformity since 2016. Concurrent gas within the vertebral body as well as gas and soft tissue fullness within the right psoas muscle. Findings are overall suspicious for osteomyelitis and discitis with extension into the right psoas muscle representing infected hematoma or abscess. 3.  Possible constipation. These results were called by telephone at the time of interpretation on 06/07/2016 at 5:41 pm to Dr. Doug SouSAM JACUBOWITZ , who verbally acknowledged these results. Electronically Signed   By: Jeronimo GreavesKyle  Talbot M.D.   On: 06/07/2016 17:45   Ct Knee Left Wo Contrast  Result Date: 06/09/2016 CLINICAL DATA:  Left knee injury due to fall 06/07/2016. Initial encounter. EXAM: CT OF THE LEFT KNEE WITHOUT CONTRAST TECHNIQUE: Multidetector CT imaging of the left knee was performed according to the standard protocol. Multiplanar CT image reconstructions were also generated. COMPARISON:  Plain films left knee 06/07/2016. FINDINGS: Bones/Joint/Cartilage Bones are osteopenic. The knee is flexed at 90 degrees. The patient has a metaphyseal fracture of the distal femur with approximately 2.7 cm impaction and 1/2 shaft width posterior displacement. The distal fracture fragment demonstrates posterior angulation of approximately 45 degrees. The fracture has a nondisplaced component extending through the medial femoral trochlea. No other fracture is identified. Healed below-the-knee amputation is noted. Ligaments Suboptimally assessed by CT.  Appear intact. Muscles and Tendons Intact with mild atrophy noted. Soft tissues Soft tissue contusion about the patient's fracture is noted IMPRESSION: Mildly comminuted mid diaphyseal fracture of the distal femur as described above. Osteopenia. Status  post below the knee amputation. Electronically Signed   By: Drusilla Kannerhomas  Dalessio M.D.   On: 06/09/2016 07:19   Mr Lumbar Spine Wo Contrast  Result Date: 06/07/2016 CLINICAL DATA:  Status post fall.  Back pain. EXAM: MRI LUMBAR SPINE WITHOUT CONTRAST TECHNIQUE: Multiplanar, multisequence MR imaging of the lumbar spine was performed. No intravenous contrast was administered. COMPARISON:  CT abdomen and pelvis 06/07/2016. FINDINGS: Segmentation:  Standard. Alignment:  Physiologic. Vertebrae: There is an acute compression fracture of L4, significant superior endplate depression, diffuse bone marrow edema, no involvement of the pedicles. 3 mm retropulsed fragment. Conus medullaris: Extends to the L1 level and appears normal. Paraspinal and other soft tissues: There is T1 hypointense, T2 hyperintense heterogeneous  fluid collection enlarging the RIGHT psoas muscle. Representative cross-section as seen on axial image 26 of 36 x 40 mm. Small T1 and T2 hypointense bubbles of air can be seen within. Psoas abscess is suspected. Disc levels: The L1-2, and L2-3 disc spaces are normal. At L3-4 the disc is unremarkable but there is mild stenosis related to the retropulsed bone and posterior element hypertrophy just below the interspace. At L4-5 there is mild stenosis related to posterior element hypertrophy. At L5-S1 there is advanced disc space narrowing. Osseous ridging with annular bulging is observed. 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 Tissue sampling is warranted. Acute L4 compression fracture, without evidence for discitis. 3 mm retropulsed bone. Given the air which is within the L4 vertebral body as seen on CT, L4 osteomyelitis is not excluded. Electronically Signed   By: Elsie Stain M.D.   On: 06/07/2016 21:29   Mr Pelvis Wo Contrast  Result Date: 06/07/2016 CLINICAL DATA:  Pain in the left hip and right knee. Changes suggesting discitis/ osteomyelitis seen on CT. EXAM: MRI PELVIS WITHOUT CONTRAST  TECHNIQUE: Multiplanar multisequence MR imaging of the pelvis was performed. No intravenous contrast was administered. COMPARISON:  MRI lumbar spine 06/07/2016.  CT pelvis 06/07/2016. FINDINGS: The examination is technically limited due to prominent susceptibility artifact arising from left hip arthroplasty and due to motion artifact as well as wraparound artifact. Urinary Tract: Bladder wall is not thickened and no filling defects are demonstrated. No distal ureteral dilatation. Bowel: Visualized portions of small and large bowel are not abnormally distended. Vascular/Lymphatic: No significant lymphadenopathy in the pelvis. Visualized iliac and femoral arteries and veins are normal in caliber with normal flow voids demonstrated. Reproductive: Uterus appears surgically absent. Ovaries are not specifically demonstrated but no abnormal adnexal masses are seen. Other: Small fat containing stick daily in hernia on the left. Small right inguinal hernia containing fat. Musculoskeletal: Diffuse muscular atrophy involving the visualized pelvic musculature. Compression deformity and increased T2 signal intensity demonstrated within the L4 vertebra. This is more completely demonstrated on the MRI lumbar spine and CT. Overall appearance is worrisome for osteomyelitis. See additional reports. Diffuse increased T2 signal intensity and expansion of the right iliopsoas muscle with focal circumscribed collection measuring 1.8 x 4.6 cm in diameter. There is Less prominent T2 signal intensity in the left psoas muscle. Findings are consistent with inflammatory change and likely represents psoas abscess. Postoperative left hip arthroplasty. Susceptibility artifact arising from the metallic hardware precludes evaluation of the left hip arthroplasty and surrounding bone structures. There is increased T2 signal intensity in the soft tissues around the visualized midshaft of the femur, extending below the field of view. Given the finding  of distal left femoral fracture on plain films of the knee, this likely represents hematoma or edema related to the fractures. Additional femoral shaft fractures are not excluded. Bone marrow signal intensities in the visualized pelvis, sacrum, and right hip are unremarkable without evidence of fracture or contusion. IMPRESSION: 1. Abscess and inflammatory changes demonstrated in the right ileus psoas muscle and less prominent in the left psoas muscle. Compression of the L4 vertebra possibly indicating osteomyelitis. See additional reports of CT and lumbar spine MR from earlier today. 2. Large amount of susceptibility artifact arises from left hip arthroplasty precluding evaluation of the left hip for tractor or dislocation. There is soft tissue edema in the musculature around the visualized midshaft femur, probably arising from the known distal femoral fracture. Otherwise, no marrow changes to suggest pelvic,  sacral, or right hip fractures. 3. Incidental finding of small left stick galea and and right inguinal hernias containing fat. Electronically Signed   By: Burman Nieves M.D.   On: 06/07/2016 23:01   Ct Aspiration  Result Date: 06/09/2016 INDICATION: Concern for discitis/osteomyelitis with paraspinal abscess. Please perform CT-guided paraspinal aspiration/drain placement EXAM: CT GUIDANCE NEEDLE PLACEMENT COMPARISON:  Lumbar spine MRI - 05/28/2016 MEDICATIONS: The patient is currently admitted to the hospital and receiving intravenous antibiotics. The antibiotics were administered within an appropriate time frame prior to the initiation of the procedure. ANESTHESIA/SEDATION: Moderate (conscious) sedation was employed during this procedure. A total of Versed 0.5 mg and Fentanyl 25 mcg was administered intravenously. Moderate Sedation Time: 15 minutes. The patient's level of consciousness and vital signs were monitored continuously by radiology nursing throughout the procedure under my direct supervision.  CONTRAST:  None COMPLICATIONS: None immediate. PROCEDURE: Informed written consent was obtained from the patient after a discussion of the risks, benefits and alternatives to treatment. The patient was placed right lateral decubitus on the CT gantry and a pre procedural CT was performed re-demonstrating the known fluid collection within the right paraspinal musculature with dominant ill-defined tear complaining component measuring approximately 3.3 x 3.4 cm (image 52, series 2). The procedure was planned. A timeout was performed prior to the initiation of the procedure. The skin overlying the posterior back was prepped and draped in the usual sterile fashion. The overlying soft tissues were anesthetized with 1% lidocaine with epinephrine. Appropriate trajectory was planned with the use of a 22 gauge spinal needle. An 18 gauge trocar needle was advanced into the fluid collection. Appropriate position was confirmed however no fluid could be aspirated from the collection. A short Amplatz wire was coiled within the collection. Appropriate position was again confirmed and the Amplatz wire was removed however again, no fluid was able to be aspirated from the trocar needle. Again, a short Amplatz wire was coiled within the collection. Appropriate position was confirmed and the trocar needle was exchanged for a Yueh sheath catheter. Approximately 3 cc of thick bloody fluid was aspirated as the Yueh sheath catheter was slowly removed. All aspirated fluid was capped and sent to the laboratory for analysis. A dressing was placed. The patient tolerated the procedure well without immediate post procedural complication. IMPRESSION: Successful CT guided placement of aspiration of approximately 3 cc of thick bloody fluid from right paraspinal fluid collection, which given aspiration results is favored to represent a hematoma. All aspirated fluid was capped and sent to the laboratory for analysis. Given concern that the  indeterminate right-sided paraspinal fluid collection represents an evolving hematoma, a drainage catheter was not placed. Electronically Signed   By: Simonne Come M.D.   On: 06/09/2016 08:27    Discharge Instructions: Discharge Instructions    Diet - low sodium heart healthy    Complete by:  As directed    Discharge instructions    Complete by:  As directed    Please continue to take your medications as prescribed and attend all dialysis sessions. Work with physical therapy to regain your strength.   Increase activity slowly    Complete by:  As directed       Signed: Althia Forts, MD 06/12/2016, 1:05 PM   Pager: [phone]

## 2016-06-12 NOTE — Progress Notes (Signed)
[facility] will be [facility] there this pm for additional therapies. Pt aware of move. All belongings with pt. Pt demonstrates no s/sx of distress.  Report called to [facility]. Transport schduled for 1430

## 2016-06-12 NOTE — Progress Notes (Signed)
Physical Therapy Treatment Patient Details Name: [name] MRN: [id number] DOB: 1946-05-15 Today's Date: 06/12/2016    History of Present Illness Admitted after fall transferring to he wheelchair in her bathroom ("brakes were on, but he wheelchair moved") resulting in L distal femur fx (s/p ORIF, NWB; prev BKA and does not use prosthesis), L4 vertebral compression fx (Neurosurgery consulted, considering possibility of osteo or discitis), R psoas fluid collection (s/p drain by IR, awaiting cultures, nothing growing yet, so observing off of antibiotics);  has a past medical history of Chronic systolic congestive heart failure (HCC); Constipation; Diabetes mellitus type 2, controlled, with complications (HCC); Diabetic retinopathy; ESRD (end stage renal disease) on dialysis (HCC) (HD since 07/2008) with sequelae; Fibromyalgia;  Osteoporosis; PAD (peripheral artery disease); Pulmonary hypertension; Refusal of blood transfusions as patient is Jehovah's Witness    PT Comments    Pt presents with continued difficulty with transfers and bed mobs as noted below. Pt requires max A for attempted sit to stand, but is able to perform bed mobs with decreased assistance. Pt is making slow progress toward goals and is limited by pain.    Follow Up Recommendations  SNF     Equipment Recommendations  None recommended by PT    Recommendations for Other Services       Precautions / Restrictions Precautions Precautions: Fall Precaution Comments: Watch BPs Restrictions Weight Bearing Restrictions: Yes LLE Weight Bearing: Non weight bearing    Mobility  Bed Mobility Overal bed mobility: Needs Assistance Bed Mobility: Supine to Sit;Sit to Supine     Supine to sit: Min assist Sit to supine: Mod assist   General bed mobility comments: Cues for technique, Assistance with LLE to EOB. Assisted to supine with Mod A and needs Mod A to move up into bed  Transfers Overall transfer level: Needs  assistance Equipment used: 2 person hand held assist Transfers: Sit to/from Stand Sit to Stand: Max assist;+2 safety/equipment;+2 physical assistance         General transfer comment: Attempted sit to stand, BP 135/78 in sitting. Pt is unable to stand and hollars out in pain.   Ambulation/Gait                 Stairs            Wheelchair Mobility    Modified Rankin (Stroke Patients Only)       Balance                                    Cognition Arousal/Alertness: Awake/alert Behavior During Therapy: WFL for tasks assessed/performed Overall Cognitive Status: Within Functional Limits for tasks assessed                      Exercises General Exercises - Lower Extremity Ankle Circles/Pumps: AROM;20 reps;Supine;Right Quad Sets: AROM;Right;10 reps;Supine Gluteal Sets: AROM;Both;10 reps;Supine    General Comments        Pertinent Vitals/Pain Pain Assessment: 0-10 Pain Score: 9  Pain Location: L residual limb with movement Pain Descriptors / Indicators: Aching;Grimacing;Guarding Pain Intervention(s): Monitored during session;Premedicated before session;Repositioned    Home Living                      Prior Function            PT Goals (current goals can now be found in the care plan section) Acute Rehab PT  Goals Patient Stated Goal: Back to independence Progress towards PT goals: Progressing toward goals    Frequency    Min 4X/week      PT Plan Current plan remains appropriate    Co-evaluation             End of Session Equipment Utilized During Treatment: Gait belt Activity Tolerance: Patient tolerated treatment well Patient left: in bed;with call bell/phone within reach     Time: 1009-1032 PT Time Calculation (min) (ACUTE ONLY): 23 min  Charges:  $Therapeutic Activity: 23-37 mins                    G Codes:      Colin BroachSabra M. Bronte Kropf PT, DPT  [phone]  06/12/2016, 1:30 PM

## 2016-06-12 NOTE — Clinical Social Work Placement (Signed)
   CLINICAL SOCIAL WORK PLACEMENT  NOTE  Date:  06/12/2016  Patient Details  Name: [name] MRN: [id number] Date of Birth: 03-Aug-1945  Clinical Social Work is seeking post-discharge placement for this patient at the Skilled  Nursing Facility level of care (*CSW will initial, date and re-position this form in  chart as items are completed):      Patient/family provided with [facility] Clinical Social Work Department's list of facilities offering this level of care within the geographic area requested by the patient (or if unable, by the patient's family).  Yes   Patient/family informed of their freedom to choose among providers that offer the needed level of care, that participate in Medicare, Medicaid or managed care program needed by the patient, have an available bed and are willing to accept the patient.      Patient/family informed of [facility]'s ownership interest in [facility] and [facility], as well as of the fact that they are under no obligation to receive care at these facilities.  PASRR submitted to EDS on       PASRR number received on 06/12/16     Existing PASRR number confirmed on       FL2 transmitted to all facilities in geographic area requested by pt/family on 06/12/16     FL2 transmitted to all facilities within larger geographic area on       Patient informed that his/her managed care company has contracts with or will negotiate with certain facilities, including the following:        Yes   Patient/family informed of bed offers received.  Patient chooses bed at [facility]     Physician recommends and patient chooses bed at      Patient to be transferred to [facility] on 06/12/16.  Patient to be transferred to facility by [facility]AR     Patient family notified on 06/12/16 of transfer.  Name of family member notified:  Patient     PHYSICIAN Please prepare priority discharge summary, including medications,  Please prepare prescriptions, Please sign FL2, Please sign DNR     Additional Comment:    _______________________________________________ Tina AmericanBridget A Mayton, LCSW 06/12/2016, 11:36 AM

## 2016-06-12 NOTE — Clinical Social Work Note (Signed)
Clinical Social Work Assessment  Patient Details  Name: Tina BouillonMaria C [name] MRN: [id number] Date of Birth: February 04, 1946  Date of referral:  06/12/16               Reason for consult:  Facility Placement                Permission sought to share information with:    Permission granted to share information::     Name::        Agency::     Relationship::     Contact Information:     Housing/Transportation Living arrangements for the past 2 months:  Single Family Home Source of Information:  Patient Patient Interpreter Needed:  None Criminal Activity/Legal Involvement Pertinent to Current Situation/Hospitalization:  No - Comment as needed Significant Relationships:  Friend Lives with:  Self Do you feel safe going back to the place where you live?  Yes Need for family participation in patient care:     Care giving concerns:  No family or friends at bedside during initial assessment.   Social Worker assessment / plan:  CSW spoke with pt at bedside to complete initial assessment. Pt is agreeable to SNF placement at this time. Pt gets dialysis M, W, F. Pt prefers to go to [name]. Pt reports her dialysis center is next door to [name]. Pt has insurance through [name]. CSW spoke with on call RN at [name] and pt is approved to go to [name] and [name] is approved as well. CSW will reach out to facility and set up transportation.  Employment status:    Insurance information:  Other (Comment Required) ([name]) PT Recommendations:  Skilled Nursing Facility Information / Referral to community resources:  Skilled Nursing Facility  Patient/Family's Response to care:  Pt verbalized understanding of CSW role and expressed appreciation of support. Pt denies any concern regarding pt care at this time.   Patient/Family's Understanding of and Emotional Response to Diagnosis, Current Treatment, and Prognosis:  Pt verbalized understanding and is realistic regarding  physical limitations. Pt denies any concern regarding treatment plan at this time. Pt is agreeable to placement at [name] at this time. CSW will continue to provide support.  Emotional Assessment Appearance:  Appears stated age Attitude/Demeanor/Rapport:   (Patient was appropriate.) Affect (typically observed):  Accepting, Appropriate, Calm Orientation:  Oriented to Self, Oriented to Place, Oriented to  Time, Oriented to Situation Alcohol / Substance use:  Not Applicable Psych involvement (Current and /or in the community):  No (Comment)  Discharge Needs  Concerns to be addressed:    Readmission within the last 30 days:    Current discharge risk:  Dependent with Mobility Barriers to Discharge:  Continued Medical Work up   [employer] IncBridget A Mayton, LCSW 06/12/2016, 11:33 AM

## 2016-06-13 LAB — AEROBIC/ANAEROBIC CULTURE W GRAM STAIN (SURGICAL/DEEP WOUND): Culture: NO GROWTH

## 2016-06-13 LAB — CULTURE, BLOOD (ROUTINE X 2)
CULTURE: NO GROWTH
Culture: NO GROWTH

## 2016-06-13 LAB — AEROBIC/ANAEROBIC CULTURE (SURGICAL/DEEP WOUND)

## 2016-06-18 ENCOUNTER — Ambulatory Visit (INDEPENDENT_AMBULATORY_CARE_PROVIDER_SITE_OTHER): Payer: Medicare (Managed Care)

## 2016-06-18 ENCOUNTER — Encounter (INDEPENDENT_AMBULATORY_CARE_PROVIDER_SITE_OTHER): Payer: Self-pay | Admitting: Orthopedic Surgery

## 2016-06-18 ENCOUNTER — Ambulatory Visit (INDEPENDENT_AMBULATORY_CARE_PROVIDER_SITE_OTHER): Payer: Medicare (Managed Care) | Admitting: Orthopedic Surgery

## 2016-06-18 DIAGNOSIS — Z89512 Acquired absence of left leg below knee: Secondary | ICD-10-CM | POA: Diagnosis not present

## 2016-06-18 DIAGNOSIS — S728X2D Other fracture of left femur, subsequent encounter for closed fracture with routine healing: Secondary | ICD-10-CM

## 2016-06-18 NOTE — Progress Notes (Signed)
Post-Op Visit Note   Patient: [name]           Date of Birth: 11/13/1945           MRN: [id number] Visit Date: 06/18/2016 PCP: Sherian Maroon, MD   Assessment & Plan:  Chief Complaint:  Chief Complaint  Patient presents with  . Left Leg - Routine Post Op   Visit Diagnoses:  1. Other closed fracture of left femur with routine healing, unspecified portion of femur, subsequent encounter   2. Hx of BKA, left ([place])     Plan: Majesty is a 71 year old patient is now about a week out from left distal femur fracture fixation.  She has a BKA in the distal femur fracture which was provisionally stabilized with pins and screws.  On exam incisions are intact.  The lateral pin has submerged beneath the skin.  It is palpable and will require removal as expected in 3 weeks.  Medial pins still is intact.  Plan today is to place the leg in a less cumbersome fiberglass splint.  She has minimal to no groin pain with internal/external rotation of the leg.  We need to see her back next week just to make sure there is no infection issue with his pin at submerge in the end.  I'll see her back at that time with removal splint and evaluation of the skin only and then placement back into a splint.  Follow-Up Instructions: No Follow-up on file.   Orders:  Orders Placed This Encounter  Procedures  . XR Knee 1-2 Views Left   No orders of the defined types were placed in this encounter.   Imaging: Xr Knee 1-2 Views Left  Result Date: 06/18/2016 2 views left knee reviewed.  Fracture alignment looks reasonable with 2 pins crossing the joint and 3 screws in the distal femur.  No complicating features noted   PMFS History: Patient Active Problem List   Diagnosis Date Noted  . Hx of BKA, left ([place]) 06/18/2016  . Nontraumatic psoas hematoma   . Anemia   . Closed bicondylar fracture of distal femur, left, initial encounter ([place]) 06/09/2016  . Closed left femoral fracture ([place]) 06/08/2016    . Compression fracture of L4 lumbar vertebra ([place]) 06/07/2016  . Acute respiratory failure ([place]) 08/09/2012  . Altered mental status 08/09/2012  . Narcotic overdose 08/09/2012  . Hyperkalemia 08/09/2012  . Chronic total occlusion of artery of the extremities ([place]) 10/16/2011  . Peripheral vascular disease, unspecified 10/16/2011  . ESRD (end stage renal disease) on dialysis ([place]) 04/08/2011  . Diabetes mellitus type II 04/08/2011  . Diabetic retinopathy associated with type 2 diabetes mellitus ([place]) 04/08/2011  . GERD (gastroesophageal reflux disease) 04/08/2011  . CAD, multiple vessel 04/08/2011  . PAD (peripheral artery disease) ([place]) 04/08/2011  . CHF (congestive heart failure) ([place]) 04/08/2011  . Hypertension 04/08/2011  . Fibromyalgia 04/08/2011  . Hypothyroid 04/08/2011  . Anxiety 04/08/2011  . Major depression 04/08/2011  . Peripheral neuropathy ([place]) 04/08/2011  . Morphea 04/08/2011  . Osteoporosis 04/08/2011  . Carpal tunnel syndrome on both sides 04/08/2011  . Refusal of blood transfusions as patient is Jehovah's Witness 04/08/2011  . Hyperparathyroidism, secondary ([place]) 04/08/2011  . Vitamin D deficiency 04/08/2011   Past Medical History:  Diagnosis Date  . Anxiety   . Asthma   . CAD (coronary artery disease)    s/p MI x 2, s/p stent x 4 (05/2005 with DES placed at that time)  . Carpal tunnel  syndrome, bilateral   . Chronic systolic congestive heart failure ([place])    EF 35-40% per 2D echo (03/2011)  . Constipation   . Diabetes mellitus type 2, controlled, with complications ([place])    diet controlled  . Diabetic retinopathy   . ESRD (end stage renal disease) on dialysis [facility]) HD since 07/2008   [facility]; Mon, [place], Friday  . Fibromyalgia   . Hemorrhoids   . Hyperlipidemia   . Hypertension   . Hypothyroid   . Major depression   . Migraines   . Morphea   . Normocytic anemia    BL Hgb 10-12  //  Chronic, likely multifactorial AOCD and possible iron  deficiency component with transferrin sat < 20 historically.  . Osteoporosis   . PAD (peripheral artery disease) (HCC)    s/p L BKA  . Peripheral neuropathy ([place])   . PONV (postoperative nausea and vomiting)   . Pulmonary hypertension    PA Peak pressure 57 mmHg per 2D echo (03/2011)  . Refusal of blood transfusions as patient is Jehovah's Witness   . Secondary hyperparathyroidism ([place])   . Vitamin D deficiency     Family History  Problem Relation Age of Onset  . Alcohol abuse Father   . Multiple myeloma Mother   . Diabetes type II Mother   . Hypertension Mother   . Cancer Mother   . Diabetes Mother   . Appendicitis Brother     Past Surgical History:  Procedure Laterality Date  . AV FISTULA PLACEMENT  ~ 2011   right upper arm  . BYPASS GRAFT  2006  . CARDIAC CATHETERIZATION  2006, 2007   pt had 4 stents placed  . CATARACT EXTRACTION, BILATERAL Bilateral 2000  . CHOLECYSTECTOMY  1987  . HEMIARTHROPLASTY HIP Left 10/2009  . JOINT REPLACEMENT  2007   Partial placement Left Hip  . LEG AMPUTATION BELOW KNEE Left 10/2008   left  . ORIF FEMUR FRACTURE Left 06/09/2016   Procedure: CLOSED REDUCTION,  CANNULATED SCREWS PINNING;  Surgeon: Meredith Pel, MD;  Location: [facility];  Service: Orthopedics;  Laterality: Left;  . PERIPHERAL ARTERIAL STENT GRAFT Bilateral    bilaterally "(left ~ 2010; right ~ 2011"  . VAGINAL HYSTERECTOMY  1994   with BSO   Social History   Occupational History  . retired Unemployed    was [employer], disabled since 2006   Social History Main Topics  . Smoking status: Former Smoker    Packs/day: 2.00    Years: 30.00    Types: Cigarettes    Quit date: 04/07/1989  . Smokeless tobacco: Never Used  . Alcohol use No     Comment: "socially; I'm not an alcoholic"  . Drug use: No  . Sexual activity: Not on file

## 2016-06-25 ENCOUNTER — Other Ambulatory Visit (INDEPENDENT_AMBULATORY_CARE_PROVIDER_SITE_OTHER): Payer: Self-pay | Admitting: Orthopedic Surgery

## 2016-06-25 ENCOUNTER — Ambulatory Visit (INDEPENDENT_AMBULATORY_CARE_PROVIDER_SITE_OTHER): Payer: Medicare (Managed Care) | Admitting: Orthopedic Surgery

## 2016-06-25 ENCOUNTER — Encounter (INDEPENDENT_AMBULATORY_CARE_PROVIDER_SITE_OTHER): Payer: Self-pay | Admitting: Orthopedic Surgery

## 2016-06-25 DIAGNOSIS — Z969 Presence of functional implant, unspecified: Secondary | ICD-10-CM

## 2016-06-25 DIAGNOSIS — S72412D Displaced unspecified condyle fracture of lower end of left femur, subsequent encounter for closed fracture with routine healing: Secondary | ICD-10-CM

## 2016-06-25 NOTE — Progress Notes (Signed)
 Post-Op Visit Note   Patient: [name]           Date of Birth: 01/09/1946           MRN: [id number] Visit Date: 06/25/2016 PCP: KOEHLER,ROBERT NICHOLAS, MD   Assessment & Plan:  Chief Complaint:  Chief Complaint  Patient presents with  . Left Leg - Follow-up, Fracture   Visit Diagnoses:  1. Closed displaced fracture of condyle of left femur with routine healing     Plan: [name] is a patient who is now 2 weeks out left distal femur fracture fixation.  Splints removed.  Incisions okay.  We need to take these pins out next week.  I will put her back into a splint.  In general were trying to get a little bit of fracture healing to occur before he take the pins out.  We will aim for next Thursday for pin removal.  That'll be done as an outpatient.  Follow-Up Instructions: No Follow-up on file.   Orders:  No orders of the defined types were placed in this encounter.  No orders of the defined types were placed in this encounter.   Imaging: No results found.  PMFS History: Patient Active Problem List   Diagnosis Date Noted  . Hx of BKA, left (HCC) 06/18/2016  . Nontraumatic psoas hematoma   . Anemia   . Closed bicondylar fracture of distal femur, left, initial encounter (HCC) 06/09/2016  . Closed left femoral fracture (HCC) 06/08/2016  . Compression fracture of L4 lumbar vertebra (HCC) 06/07/2016  . Acute respiratory failure (HCC) 08/09/2012  . Altered mental status 08/09/2012  . Narcotic overdose 08/09/2012  . Hyperkalemia 08/09/2012  . 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 Peripheral vascular disease, unspecified 10/16/2011  . ESRD (end stage renal disease) on dialysis (HCC) 04/08/2011  . Diabetes mellitus type II 04/08/2011  . Diabetic retinopathy associated with type 2 diabetes mellitus (HCC) 04/08/2011  . GERD (gastroesophageal reflux disease) 04/08/2011  . CAD, multiple vessel 04/08/2011  . PAD (peripheral artery disease)  (HCC) 04/08/2011  . CHF (congestive heart failure) (HCC) 04/08/2011  . Hypertension 04/08/2011  . Fibromyalgia 04/08/2011  . Hypothyroid 04/08/2011  . Anxiety 04/08/2011  . Major depression 04/08/2011  . Peripheral neuropathy (HCC) 04/08/2011  . Morphea 04/08/2011  . Osteoporosis 04/08/2011  . Carpal tunnel syndrome on both sides 04/08/2011  . Refusal of blood transfusions as patient is Jehovah's Witness 04/08/2011  . Hyperparathyroidism, secondary (HCC) 04/08/2011  . Vitamin D deficiency 04/08/2011   Past Medical History:  Diagnosis Date  . Anxiety   . Asthma   . CAD (coronary artery disease)    s/p MI x 2, s/p stent x 4 (05/2005 with DES placed at that time)  . Carpal tunnel syndrome, bilateral   . Chronic systolic congestive heart failure (HCC)    EF 35-40% per 2D echo (03/2011)  . Constipation   . Diabetes mellitus type 2, controlled, with complications (HCC)    diet controlled  . Diabetic retinopathy   . ESRD (end stage renal disease) on dialysis (HCC) HD since 07/2008   [facility]; Mon, Wed, Friday  . Fibromyalgia   . Hemorrhoids   . Hyperlipidemia   . Hypertension   . Hypothyroid   . Major depression   . Migraines   . Morphea   . Normocytic anemia    BL Hgb 10-12  //  Chronic, likely multifactorial AOCD and possible iron deficiency component with transferrin   sat < 20 historically.  . Osteoporosis   . PAD (peripheral artery disease) (HCC)    s/p L BKA  . Peripheral neuropathy ([place])   . PONV (postoperative nausea and vomiting)   . Pulmonary hypertension    PA Peak pressure 57 mmHg per 2D echo (03/2011)  . Refusal of blood transfusions as patient is Jehovah's Witness   . Secondary hyperparathyroidism ([place])   . Vitamin D deficiency     Family History  Problem Relation Age of Onset  . Alcohol abuse Father   . Multiple myeloma Mother   . Diabetes type II Mother   . Hypertension Mother   . Cancer Mother   . Diabetes Mother   . Appendicitis Brother     Past  Surgical History:  Procedure Laterality Date  . AV FISTULA PLACEMENT  ~ 2011   right upper arm  . BYPASS GRAFT  2006  . CARDIAC CATHETERIZATION  2006, 2007   pt had 4 stents placed  . CATARACT EXTRACTION, BILATERAL Bilateral 2000  . CHOLECYSTECTOMY  1987  . HEMIARTHROPLASTY HIP Left 10/2009  . JOINT REPLACEMENT  2007   Partial placement Left Hip  . LEG AMPUTATION BELOW KNEE Left 10/2008   left  . ORIF FEMUR FRACTURE Left 06/09/2016   Procedure: CLOSED REDUCTION,  CANNULATED SCREWS PINNING;  Surgeon: Meredith Pel, MD;  Location: [facility];  Service: Orthopedics;  Laterality: Left;  . PERIPHERAL ARTERIAL STENT GRAFT Bilateral    bilaterally "(left ~ 2010; right ~ 2011"  . VAGINAL HYSTERECTOMY  1994   with BSO   Social History   Occupational History  . retired Unemployed    was [employer], disabled since 2006   Social History Main Topics  . Smoking status: Former Smoker    Packs/day: 2.00    Years: 30.00    Types: Cigarettes    Quit date: 04/07/1989  . Smokeless tobacco: Never Used  . Alcohol use No     Comment: "socially; I'm not an alcoholic"  . Drug use: No  . Sexual activity: Not on file

## 2016-06-26 ENCOUNTER — Telehealth (INDEPENDENT_AMBULATORY_CARE_PROVIDER_SITE_OTHER): Payer: Self-pay | Admitting: Orthopedic Surgery

## 2016-06-26 ENCOUNTER — Ambulatory Visit (INDEPENDENT_AMBULATORY_CARE_PROVIDER_SITE_OTHER): Payer: Self-pay | Admitting: Orthopedic Surgery

## 2016-06-26 NOTE — Telephone Encounter (Signed)
Ok to transfer only pls clal htx

## 2016-06-26 NOTE — Telephone Encounter (Signed)
Oley BalmSusan Varga from [facility]  called needing clarification for (PT and OT).  The number to contact her is [phone]

## 2016-06-26 NOTE — Telephone Encounter (Signed)
IC and advised ok to transfer only.  LMVM

## 2016-06-26 NOTE — Telephone Encounter (Signed)
Please advise what orders to give?  thanks

## 2016-06-30 ENCOUNTER — Telehealth (INDEPENDENT_AMBULATORY_CARE_PROVIDER_SITE_OTHER): Payer: Self-pay | Admitting: Orthopedic Surgery

## 2016-06-30 NOTE — Pre-Procedure Instructions (Signed)
Tina BouillonMaria C [name]  06/30/2016     No Pharmacies Listed   Your procedure is scheduled on Thurs, Feb 8 @ 1:05 PM  [facility] to [facility] Admitting at 11:00 AM  Call this number if you have problems the morning of surgery:  [phone]   Remember:  Do not eat food or drink liquids after midnight.  Take these medicines the morning of surgery with A SIP OF WATER Alprazolam(Xanax),Zyrtec(Cetirizine),Lexapro(Escitalopram),Synthroid(Levothyroxine),Metoprolol(Toprol), Pain Pill(if needed),and Phenergan(Promethazine-if needed)             Stop taking Plavix along with any Vitamins or Herbal Medications. No Goody's,BC's,Aleve,Advil,Motrin,Ibuprofen.   Do not wear jewelry, make-up or nail polish.  Do not wear lotions, powders, or perfumes, or deoderant.  Do not shave 48 hours prior to surgery.  Men may shave face and neck.  Do not bring valuables to the hospital.  [facility] is not responsible for any belongings or valuables.  Contacts, dentures or bridgework may not be worn into surgery.  Leave your suitcase in the car.  After surgery it may be brought to your room.  For patients admitted to the hospital, discharge time will be determined by your treatment team.  Patients discharged the day of surgery will not be allowed to drive home.    [facility]  Before surgery, you can play an important role.  Because skin is not sterile, your skin needs to be as free of germs as possible.  You can reduce the number of germs on you skin by washing with CHG (chlorahexidine gluconate) soap before surgery.  CHG is an antiseptic cleaner which kills germs and bonds with the skin to continue killing germs even after washing.  Please DO NOT use if you have an allergy to CHG or antibacterial soaps.  If your skin becomes reddened/irritated stop using the CHG and inform your nurse when you arrive at Short Stay.  Do not shave (including legs and underarms) for at  least 48 hours prior to the first CHG shower.  You may shave your face.  Please follow these instructions carefully:   1.  Shower with CHG Soap the night before surgery and the                                morning of Surgery.  2.  If you choose to wash your hair, wash your hair first as usual with your       normal shampoo.  3.  After you shampoo, rinse your hair and body thoroughly to remove the                      Shampoo.  4.  Use CHG as you would any other liquid soap.  You can apply chg directly       to the skin and wash gently with scrungie or a clean washcloth.  5.  Apply the CHG Soap to your body ONLY FROM THE NECK DOWN.        Do not use on open wounds or open sores.  Avoid contact with your eyes,       ears, mouth and genitals (private parts).  Wash genitals (private parts)       with your normal soap.  6.  Wash thoroughly, paying special attention to the area where your surgery        will be performed.  7.  Thoroughly rinse your body with warm water from the neck down.  8.  DO NOT shower/wash with your normal soap after using and rinsing off       the CHG Soap.  9.  Pat yourself dry with a clean towel.            10.  Wear clean pajamas.            11.  Place clean sheets on your bed the night of your first shower and do not        sleep with pets.  Day of Surgery  Do not apply any lotions/deoderants the morning of surgery.  Please wear clean clothes to the hospital/surgery center.    Please read over the following fact sheets that you were given. Pain Booklet, Coughing and Deep Breathing and Surgical Site Infection Prevention

## 2016-06-30 NOTE — Progress Notes (Deleted)
Left a message with Dois DavenportSandra at Dr.Dean's office that orders needed for PAT appointment

## 2016-07-01 ENCOUNTER — Encounter (HOSPITAL_COMMUNITY)
Admission: RE | Admit: 2016-07-01 | Discharge: 2016-07-01 | Disposition: A | Discharge status: Home / Self Care | Payer: Medicare (Managed Care) | Source: Ambulatory Visit | Attending: Orthopedic Surgery | Admitting: Orthopedic Surgery

## 2016-07-01 ENCOUNTER — Encounter (HOSPITAL_COMMUNITY): Payer: Self-pay

## 2016-07-01 DIAGNOSIS — I132 Hypertensive heart and chronic kidney disease with heart failure and with stage 5 chronic kidney disease, or end stage renal disease: Secondary | ICD-10-CM

## 2016-07-01 DIAGNOSIS — Z955 Presence of coronary angioplasty implant and graft: Secondary | ICD-10-CM

## 2016-07-01 DIAGNOSIS — G43909 Migraine, unspecified, not intractable, without status migrainosus: Secondary | ICD-10-CM | POA: Insufficient documentation

## 2016-07-01 DIAGNOSIS — F329 Major depressive disorder, single episode, unspecified: Secondary | ICD-10-CM | POA: Insufficient documentation

## 2016-07-01 DIAGNOSIS — Z91013 Allergy to seafood: Secondary | ICD-10-CM | POA: Diagnosis not present

## 2016-07-01 DIAGNOSIS — E1122 Type 2 diabetes mellitus with diabetic chronic kidney disease: Secondary | ICD-10-CM

## 2016-07-01 DIAGNOSIS — Z0183 Encounter for blood typing: Secondary | ICD-10-CM | POA: Insufficient documentation

## 2016-07-01 DIAGNOSIS — Z95828 Presence of other vascular implants and grafts: Secondary | ICD-10-CM | POA: Insufficient documentation

## 2016-07-01 DIAGNOSIS — Z87891 Personal history of nicotine dependence: Secondary | ICD-10-CM

## 2016-07-01 DIAGNOSIS — E1142 Type 2 diabetes mellitus with diabetic polyneuropathy: Secondary | ICD-10-CM

## 2016-07-01 DIAGNOSIS — Z01818 Encounter for other preprocedural examination: Secondary | ICD-10-CM

## 2016-07-01 DIAGNOSIS — F419 Anxiety disorder, unspecified: Secondary | ICD-10-CM

## 2016-07-01 DIAGNOSIS — N2581 Secondary hyperparathyroidism of renal origin: Secondary | ICD-10-CM

## 2016-07-01 DIAGNOSIS — E11319 Type 2 diabetes mellitus with unspecified diabetic retinopathy without macular edema: Secondary | ICD-10-CM | POA: Insufficient documentation

## 2016-07-01 DIAGNOSIS — D649 Anemia, unspecified: Secondary | ICD-10-CM | POA: Insufficient documentation

## 2016-07-01 DIAGNOSIS — I5022 Chronic systolic (congestive) heart failure: Secondary | ICD-10-CM | POA: Diagnosis not present

## 2016-07-01 DIAGNOSIS — Z79899 Other long term (current) drug therapy: Secondary | ICD-10-CM

## 2016-07-01 DIAGNOSIS — I2510 Atherosclerotic heart disease of native coronary artery without angina pectoris: Secondary | ICD-10-CM

## 2016-07-01 DIAGNOSIS — N186 End stage renal disease: Secondary | ICD-10-CM

## 2016-07-01 DIAGNOSIS — Z4589 Encounter for adjustment and management of other implanted devices: Secondary | ICD-10-CM | POA: Diagnosis present

## 2016-07-01 DIAGNOSIS — I272 Pulmonary hypertension, unspecified: Secondary | ICD-10-CM | POA: Insufficient documentation

## 2016-07-01 DIAGNOSIS — G5603 Carpal tunnel syndrome, bilateral upper limbs: Secondary | ICD-10-CM | POA: Diagnosis not present

## 2016-07-01 DIAGNOSIS — I252 Old myocardial infarction: Secondary | ICD-10-CM | POA: Insufficient documentation

## 2016-07-01 DIAGNOSIS — E039 Hypothyroidism, unspecified: Secondary | ICD-10-CM | POA: Insufficient documentation

## 2016-07-01 DIAGNOSIS — Z992 Dependence on renal dialysis: Secondary | ICD-10-CM | POA: Insufficient documentation

## 2016-07-01 DIAGNOSIS — Z7902 Long term (current) use of antithrombotics/antiplatelets: Secondary | ICD-10-CM | POA: Diagnosis not present

## 2016-07-01 DIAGNOSIS — J45909 Unspecified asthma, uncomplicated: Secondary | ICD-10-CM

## 2016-07-01 DIAGNOSIS — M797 Fibromyalgia: Secondary | ICD-10-CM | POA: Insufficient documentation

## 2016-07-01 DIAGNOSIS — Z969 Presence of functional implant, unspecified: Secondary | ICD-10-CM

## 2016-07-01 DIAGNOSIS — E785 Hyperlipidemia, unspecified: Secondary | ICD-10-CM | POA: Diagnosis not present

## 2016-07-01 DIAGNOSIS — Z880 Allergy status to penicillin: Secondary | ICD-10-CM | POA: Diagnosis not present

## 2016-07-01 DIAGNOSIS — Z7982 Long term (current) use of aspirin: Secondary | ICD-10-CM | POA: Insufficient documentation

## 2016-07-01 DIAGNOSIS — Z01812 Encounter for preprocedural laboratory examination: Secondary | ICD-10-CM

## 2016-07-01 DIAGNOSIS — Z89512 Acquired absence of left leg below knee: Secondary | ICD-10-CM

## 2016-07-01 LAB — BASIC METABOLIC PANEL
ANION GAP: 15 (ref 5–15)
BUN: 21 mg/dL — ABNORMAL HIGH (ref 6–20)
CALCIUM: 8.8 mg/dL — AB (ref 8.9–10.3)
CO2: 25 mmol/L (ref 22–32)
Chloride: 96 mmol/L — ABNORMAL LOW (ref 101–111)
Creatinine, Ser: 4.26 mg/dL — ABNORMAL HIGH (ref 0.44–1.00)
GFR, EST AFRICAN AMERICAN: 11 mL/min — AB (ref >60)
GFR, EST NON AFRICAN AMERICAN: 10 mL/min — AB (ref >60)
Glucose, Bld: 62 mg/dL — ABNORMAL LOW (ref 65–99)
POTASSIUM: 3.4 mmol/L — AB (ref 3.5–5.1)
Sodium: 136 mmol/L (ref 135–145)

## 2016-07-01 LAB — CBC
HEMATOCRIT: 27.6 % — AB (ref 36.0–46.0)
HEMOGLOBIN: 8.7 g/dL — AB (ref 12.0–15.0)
MCH: 31.6 pg (ref 26.0–34.0)
MCHC: 31.5 g/dL (ref 30.0–36.0)
MCV: 100.4 fL — ABNORMAL HIGH (ref 78.0–100.0)
Platelets: 221 10*3/uL (ref 150–400)
RBC: 2.75 MIL/uL — AB (ref 3.87–5.11)
RDW: 17.8 % — ABNORMAL HIGH (ref 11.5–15.5)
WBC: 5.1 10*3/uL (ref 4.0–10.5)

## 2016-07-01 LAB — NO BLOOD PRODUCTS

## 2016-07-01 MED ORDER — CHLORHEXIDINE GLUCONATE 4 % EX LIQD: 60.0000 mL | Freq: Once | CUTANEOUS | Status: DC

## 2016-07-01 NOTE — Progress Notes (Addendum)
Medical Md:  Cardiologist:  EKG: 1/18-in EPIC  Stress test:  ECHO:  Cardiac Cath:  Chest x-ray:04/2011 in EPIC

## 2016-07-02 NOTE — Progress Notes (Signed)
Anesthesia Chart Review: Patient is a 71 year old female scheduled for removal of pins, left leg on 07/03/16 by Dr. August Saucer. She was admitted 06/07/16-06/12/16 following a fall in which she sustained a closed left femoral fracture and underwent closed reduction and percutaneous pinning on 06/09/16 (femoral nerve block, LMA). Imaging also showed a L4 compression fracture with changes concerning for osteomyelitis and right psoas hematoma. ID was consulted and thought osteomyelitis was unlikely. Aspirate was more consistent with hematoma. Plavix was held. She received erythropoietin and iron infusion for symptomatic anemia, as she refused blood transfusion (Jehovah's Witness).   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  Nephrologist is with [employer]. I don't see any recent cardiology notes. She was seeing Dr. Aleen Campi in 2006-2007.   Meds includes Xanax, aspirin 81 mg, Tums, Zyrtec, Klonopin, Plavix (on hold since 06/09/16 femur fracture; on hold due to anemia), Lexapro, hydroxyzine, Fosrenol, levothyroxine, lovastatin, Toprol-XL, Pamelor, Percocet, valsartan.   EKG 06/09/16: Normal sinus rhythm, ST and T-wave abnormality, consider anterolateral ischemia. Anterolateral T wave inversions are new since 08/10/02.  Echo 04/10/11: Study Conclusions - Left ventricle: Diffuse hypokinesis worse in the posterior lateral and inferior walls The  cavity size was moderately dilated. There was mild concentric hypertrophy. Systolic function was moderately reduced. The estimated ejection fraction was in the range of 35% to 40%. - Mitral valve: Moderate regurgitation. - Left atrium: The atrium was moderately dilated. - Atrial septum: No defect or patent foramen ovale was identified. - Pulmonary arteries: PA peak pressure: 57mm Hg (S). (Comparison echo 11/12/08: LVEF 60%, cannot exclude regional wall motion abnormalities. 04/23/05: LVEF 35-45%, apical periapical wall akinesis, mid-distal septal wall akinesis, PA systolic pressure 43 mmHg.)  Cardiac cath 04/24/05: FINAL DIAGNOSES:  1.  Two-vessel coronary artery disease with 99% mid-left anterior descending      artery lesion with very slow antegrade flow and 80-90% proximal right      coronary artery lesion with good antegrade flow.  2.  Normal left internal mammary artery.  3.  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 Overall ejection fraction 35%. 4.  Normal femoral and profundus arteries. PCI 04/25/05: PTCA and subsequent triple tandem stenting for high-grade subtotal culprit lesion LAD and mid-distal, mid-and-proximal stenosis PCI 06/05/05: Successful right coronary artery stenting with proximal right coronary artery 95% and mid-right coronary artery 75% both reduced to 0% residual with a long stent 33 mm in length postdilated with a Quantum Maverick balloon to 3.31 anticipated inner lumen diameter.  Preoperative labs noted. BUN 21, Cr 4.26, glucose 62. H/H 8.7/27.6 (up from 6.9/21.7 on 06/12/16). Transfuse no blood products. She will get an ISTAT4 on arrival.  I attempted to call patient to inquire further about cardiology history and follow-up, but could only leave a voice message. She has had multiple surgeries at [facility] since  2007, and I do not see any cardiology records in Epic or scanned under the Media  tab. She tolerated orthopedic surgery last month. She remains anemic, but improved since then. She is a dialysis patient. Discussed above with anesthesiologist Dr. Krista BlueSinger. Further evaluation on the day of surgery to ensure labs acceptable and no acute CV/CHF symptoms prior to proceeding. If patient calls back today then I will plan to update my note.  Velna Ochsllison Adam Sanjuan, PA-C [facility]Anesthesiology Phone [phone] 07/02/2016 11:55 AM

## 2016-07-03 ENCOUNTER — Ambulatory Visit (HOSPITAL_COMMUNITY)
Admission: RE | Admit: 2016-07-03 | Discharge: 2016-07-03 | Disposition: A | Discharge status: Home / Self Care | Payer: Medicare (Managed Care) | Source: Ambulatory Visit | Attending: Orthopedic Surgery | Admitting: Orthopedic Surgery

## 2016-07-03 ENCOUNTER — Ambulatory Visit (HOSPITAL_COMMUNITY): Payer: Medicare (Managed Care) | Admitting: Vascular Surgery

## 2016-07-03 ENCOUNTER — Encounter (HOSPITAL_COMMUNITY)
Admission: RE | Disposition: A | Discharge status: Home / Self Care | Payer: Self-pay | Source: Ambulatory Visit | Attending: Orthopedic Surgery

## 2016-07-03 ENCOUNTER — Encounter (HOSPITAL_COMMUNITY): Payer: Self-pay | Admitting: Surgery

## 2016-07-03 DIAGNOSIS — I5022 Chronic systolic (congestive) heart failure: Secondary | ICD-10-CM | POA: Insufficient documentation

## 2016-07-03 DIAGNOSIS — E785 Hyperlipidemia, unspecified: Secondary | ICD-10-CM | POA: Insufficient documentation

## 2016-07-03 DIAGNOSIS — F419 Anxiety disorder, unspecified: Secondary | ICD-10-CM | POA: Insufficient documentation

## 2016-07-03 DIAGNOSIS — Z79899 Other long term (current) drug therapy: Secondary | ICD-10-CM | POA: Insufficient documentation

## 2016-07-03 DIAGNOSIS — N186 End stage renal disease: Secondary | ICD-10-CM | POA: Insufficient documentation

## 2016-07-03 DIAGNOSIS — E1122 Type 2 diabetes mellitus with diabetic chronic kidney disease: Secondary | ICD-10-CM | POA: Insufficient documentation

## 2016-07-03 DIAGNOSIS — Z7902 Long term (current) use of antithrombotics/antiplatelets: Secondary | ICD-10-CM | POA: Insufficient documentation

## 2016-07-03 DIAGNOSIS — N2581 Secondary hyperparathyroidism of renal origin: Secondary | ICD-10-CM | POA: Insufficient documentation

## 2016-07-03 DIAGNOSIS — Z992 Dependence on renal dialysis: Secondary | ICD-10-CM | POA: Insufficient documentation

## 2016-07-03 DIAGNOSIS — E1142 Type 2 diabetes mellitus with diabetic polyneuropathy: Secondary | ICD-10-CM | POA: Insufficient documentation

## 2016-07-03 DIAGNOSIS — E039 Hypothyroidism, unspecified: Secondary | ICD-10-CM | POA: Insufficient documentation

## 2016-07-03 DIAGNOSIS — Z87891 Personal history of nicotine dependence: Secondary | ICD-10-CM | POA: Insufficient documentation

## 2016-07-03 DIAGNOSIS — G5603 Carpal tunnel syndrome, bilateral upper limbs: Secondary | ICD-10-CM | POA: Insufficient documentation

## 2016-07-03 DIAGNOSIS — Z91013 Allergy to seafood: Secondary | ICD-10-CM | POA: Insufficient documentation

## 2016-07-03 DIAGNOSIS — Z969 Presence of functional implant, unspecified: Secondary | ICD-10-CM

## 2016-07-03 DIAGNOSIS — Z4589 Encounter for adjustment and management of other implanted devices: Secondary | ICD-10-CM | POA: Insufficient documentation

## 2016-07-03 DIAGNOSIS — Z89512 Acquired absence of left leg below knee: Secondary | ICD-10-CM | POA: Diagnosis not present

## 2016-07-03 DIAGNOSIS — F329 Major depressive disorder, single episode, unspecified: Secondary | ICD-10-CM | POA: Insufficient documentation

## 2016-07-03 DIAGNOSIS — J45909 Unspecified asthma, uncomplicated: Secondary | ICD-10-CM | POA: Insufficient documentation

## 2016-07-03 DIAGNOSIS — M797 Fibromyalgia: Secondary | ICD-10-CM | POA: Insufficient documentation

## 2016-07-03 DIAGNOSIS — I2510 Atherosclerotic heart disease of native coronary artery without angina pectoris: Secondary | ICD-10-CM | POA: Insufficient documentation

## 2016-07-03 DIAGNOSIS — I252 Old myocardial infarction: Secondary | ICD-10-CM | POA: Insufficient documentation

## 2016-07-03 DIAGNOSIS — I132 Hypertensive heart and chronic kidney disease with heart failure and with stage 5 chronic kidney disease, or end stage renal disease: Secondary | ICD-10-CM | POA: Insufficient documentation

## 2016-07-03 DIAGNOSIS — Z880 Allergy status to penicillin: Secondary | ICD-10-CM | POA: Insufficient documentation

## 2016-07-03 DIAGNOSIS — Z7982 Long term (current) use of aspirin: Secondary | ICD-10-CM | POA: Insufficient documentation

## 2016-07-03 DIAGNOSIS — E11319 Type 2 diabetes mellitus with unspecified diabetic retinopathy without macular edema: Secondary | ICD-10-CM | POA: Insufficient documentation

## 2016-07-03 HISTORY — PX: HARDWARE REMOVAL: SHX979

## 2016-07-03 LAB — GLUCOSE, CAPILLARY
GLUCOSE-CAPILLARY: 76 mg/dL (ref 65–99)
Glucose-Capillary: 57 mg/dL — ABNORMAL LOW (ref 65–99)

## 2016-07-03 LAB — POCT I-STAT 4, (NA,K, GLUC, HGB,HCT)
Glucose, Bld: 73 mg/dL (ref 65–99)
HEMATOCRIT: 31 % — AB (ref 36.0–46.0)
HEMOGLOBIN: 10.5 g/dL — AB (ref 12.0–15.0)
Potassium: 4.7 mmol/L (ref 3.5–5.1)
Sodium: 136 mmol/L (ref 135–145)

## 2016-07-03 SURGERY — REMOVAL, HARDWARE
Anesthesia: General | Site: Leg Upper | Laterality: Left

## 2016-07-03 MED ORDER — CLINDAMYCIN PHOSPHATE 900 MG/50ML IV SOLN
INTRAVENOUS | Status: AC
  Filled 2016-07-03: qty 50

## 2016-07-03 MED ORDER — PROPOFOL 10 MG/ML IV BOLUS
INTRAVENOUS | Status: DC | PRN
  Administered 2016-07-03: 90 mg via INTRAVENOUS

## 2016-07-03 MED ORDER — LIDOCAINE HCL (CARDIAC) 20 MG/ML IV SOLN
INTRAVENOUS | Status: DC | PRN
Start: 2016-07-03 — End: 2016-07-03
  Administered 2016-07-03: 100 mg via INTRAVENOUS

## 2016-07-03 MED ORDER — HYDROMORPHONE HCL 1 MG/ML IJ SOLN: 0.2500 mg | INTRAMUSCULAR | Status: DC | PRN

## 2016-07-03 MED ORDER — FENTANYL CITRATE (PF) 100 MCG/2ML IJ SOLN
INTRAMUSCULAR | Status: DC | PRN
  Administered 2016-07-03: 50 ug via INTRAVENOUS

## 2016-07-03 MED ORDER — HYDROMORPHONE HCL 1 MG/ML IJ SOLN
INTRAMUSCULAR | Status: AC
  Filled 2016-07-03: qty 0.5

## 2016-07-03 MED ORDER — FENTANYL CITRATE (PF) 100 MCG/2ML IJ SOLN
INTRAMUSCULAR | Status: AC
  Administered 2016-07-03: 100 ug via INTRAVENOUS
  Filled 2016-07-03: qty 2

## 2016-07-03 MED ORDER — GLYCOPYRROLATE 0.2 MG/ML IJ SOLN
INTRAMUSCULAR | Status: DC | PRN
  Administered 2016-07-03: .6 mg via INTRAVENOUS
  Administered 2016-07-03: 0.2 mg via INTRAVENOUS

## 2016-07-03 MED ORDER — ROCURONIUM BROMIDE 100 MG/10ML IV SOLN
INTRAVENOUS | Status: DC | PRN
Start: 2016-07-03 — End: 2016-07-03
  Administered 2016-07-03: 50 mg via INTRAVENOUS

## 2016-07-03 MED ORDER — NEOSTIGMINE METHYLSULFATE 10 MG/10ML IV SOLN
INTRAVENOUS | Status: DC | PRN
  Administered 2016-07-03: 4 mg via INTRAVENOUS
  Administered 2016-07-03: 1 mg via INTRAVENOUS

## 2016-07-03 MED ORDER — ONDANSETRON HCL 4 MG/2ML IJ SOLN
INTRAMUSCULAR | Status: DC | PRN
  Administered 2016-07-03: 4 mg via INTRAVENOUS

## 2016-07-03 MED ORDER — FENTANYL CITRATE (PF) 100 MCG/2ML IJ SOLN
100.0000 ug | Freq: Once | INTRAMUSCULAR | Status: AC
  Administered 2016-07-03: 100 ug via INTRAVENOUS

## 2016-07-03 MED ORDER — FENTANYL CITRATE (PF) 100 MCG/2ML IJ SOLN
INTRAMUSCULAR | Status: AC
  Filled 2016-07-03: qty 4

## 2016-07-03 MED ORDER — PROMETHAZINE HCL 25 MG/ML IJ SOLN: 6.2500 mg | INTRAMUSCULAR | Status: DC | PRN

## 2016-07-03 MED ORDER — 0.9 % SODIUM CHLORIDE (POUR BTL) OPTIME
TOPICAL | Status: DC | PRN
  Administered 2016-07-03: 1000 mL

## 2016-07-03 MED ORDER — SODIUM CHLORIDE 0.9 % IV SOLN
INTRAVENOUS | Status: DC
  Administered 2016-07-03 (×2): via INTRAVENOUS

## 2016-07-03 MED ORDER — CLINDAMYCIN PHOSPHATE 900 MG/50ML IV SOLN
900.0000 mg | INTRAVENOUS | Status: AC
  Administered 2016-07-03: 900 mg via INTRAVENOUS

## 2016-07-03 SURGICAL SUPPLY — 49 items
BANDAGE ACE 4X5 VEL STRL LF (GAUZE/BANDAGES/DRESSINGS) IMPLANT
BANDAGE ACE 6X5 VEL STRL LF (GAUZE/BANDAGES/DRESSINGS) IMPLANT
BANDAGE ELASTIC 6 VELCRO ST LF (GAUZE/BANDAGES/DRESSINGS) ×2 IMPLANT
BANDAGE ESMARK 6X9 LF (GAUZE/BANDAGES/DRESSINGS) IMPLANT
BNDG CMPR 9X6 STRL LF SNTH (GAUZE/BANDAGES/DRESSINGS)
BNDG COHESIVE 4X5 TAN STRL (GAUZE/BANDAGES/DRESSINGS) ×2 IMPLANT
BNDG ESMARK 6X9 LF (GAUZE/BANDAGES/DRESSINGS)
BNDG GAUZE ELAST 4 BULKY (GAUZE/BANDAGES/DRESSINGS) ×2 IMPLANT
COVER SURGICAL LIGHT HANDLE (MISCELLANEOUS) ×2 IMPLANT
CUFF TOURNIQUET SINGLE 34IN LL (TOURNIQUET CUFF) IMPLANT
CUFF TOURNIQUET SINGLE 44IN (TOURNIQUET CUFF) IMPLANT
DRAPE C-ARM 42X72 X-RAY (DRAPES) IMPLANT
DRAPE HALF SHEET 40X57 (DRAPES) IMPLANT
DRAPE IMP U-DRAPE 54X76 (DRAPES) ×4 IMPLANT
DRAPE INCISE IOBAN 66X45 STRL (DRAPES) ×2 IMPLANT
DRAPE ORTHO SPLIT 77X108 STRL (DRAPES) ×4
DRAPE PROXIMA HALF (DRAPES) IMPLANT
DRAPE SURG ORHT 6 SPLT 77X108 (DRAPES) ×2 IMPLANT
DRSG EMULSION OIL 3X3 NADH (GAUZE/BANDAGES/DRESSINGS) IMPLANT
DRSG PAD ABDOMINAL 8X10 ST (GAUZE/BANDAGES/DRESSINGS) ×2 IMPLANT
ELECT REM PT RETURN 9FT ADLT (ELECTROSURGICAL) ×2
ELECTRODE REM PT RTRN 9FT ADLT (ELECTROSURGICAL) ×1 IMPLANT
GAUZE SPONGE 4X4 12PLY STRL (GAUZE/BANDAGES/DRESSINGS) ×2 IMPLANT
GAUZE XEROFORM 1X8 LF (GAUZE/BANDAGES/DRESSINGS) ×2 IMPLANT
GLOVE BIOGEL PI IND STRL 8 (GLOVE) ×1 IMPLANT
GLOVE BIOGEL PI INDICATOR 8 (GLOVE) ×1
GLOVE SURG ORTHO 8.0 STRL STRW (GLOVE) ×2 IMPLANT
GOWN STRL REUS W/ TWL LRG LVL3 (GOWN DISPOSABLE) ×3 IMPLANT
GOWN STRL REUS W/TWL LRG LVL3 (GOWN DISPOSABLE) ×6
KIT BASIN OR (CUSTOM PROCEDURE TRAY) IMPLANT
KIT ROOM TURNOVER OR (KITS) ×2 IMPLANT
MANIFOLD NEPTUNE II (INSTRUMENTS) ×2 IMPLANT
NS IRRIG 1000ML POUR BTL (IV SOLUTION) ×2 IMPLANT
PACK GENERAL/GYN (CUSTOM PROCEDURE TRAY) ×2 IMPLANT
PAD ARMBOARD 7.5X6 YLW CONV (MISCELLANEOUS) ×2 IMPLANT
PAD CAST 4YDX4 CTTN HI CHSV (CAST SUPPLIES) IMPLANT
PADDING CAST COTTON 4X4 STRL (CAST SUPPLIES)
PADDING CAST COTTON 6X4 STRL (CAST SUPPLIES) ×2 IMPLANT
SPLINT FIBERGLASS 4X30 (CAST SUPPLIES) ×2 IMPLANT
STAPLER VISISTAT 35W (STAPLE) ×2 IMPLANT
STOCKINETTE IMPERVIOUS 9X36 MD (GAUZE/BANDAGES/DRESSINGS) ×2 IMPLANT
SUT ETHILON 4 0 FS 1 (SUTURE) IMPLANT
SUT VIC AB 0 CT1 27 (SUTURE)
SUT VIC AB 0 CT1 27XBRD ANBCTR (SUTURE) IMPLANT
SUT VIC AB 2-0 CT1 27 (SUTURE)
SUT VIC AB 2-0 CT1 TAPERPNT 27 (SUTURE) IMPLANT
TOWEL OR 17X24 6PK STRL BLUE (TOWEL DISPOSABLE) ×2 IMPLANT
TOWEL OR 17X26 10 PK STRL BLUE (TOWEL DISPOSABLE) ×2 IMPLANT
WATER STERILE IRR 1000ML POUR (IV SOLUTION) IMPLANT

## 2016-07-03 NOTE — Anesthesia Postprocedure Evaluation (Addendum)
Anesthesia Post Note  Patient: Tina BouillonMaria C [name]  Procedure(s) Performed: Procedure(s) (LRB): LEFT LEG HARDWARE REMOVAL, REMOVAL OF PINS (Left)  Patient location during evaluation: PACU Anesthesia Type: General Level of consciousness: sedated Pain management: pain level controlled Vital Signs Assessment: post-procedure vital signs reviewed and stable Respiratory status: spontaneous breathing and respiratory function stable Cardiovascular status: stable Anesthetic complications: no       Last Vitals:  Vitals:   07/03/16 1445 07/03/16 1506  BP:  121/62  Pulse: 88   Resp: 13   Temp:  36.4 C    Last Pain:  Vitals:   07/03/16 1440  TempSrc:   PainSc: 8                  Khoi Hamberger DANIEL

## 2016-07-03 NOTE — Transfer of Care (Signed)
Immediate Anesthesia Transfer of Care Note  Patient: [name]  Procedure(s) Performed: Procedure(s): LEFT LEG HARDWARE REMOVAL, REMOVAL OF PINS (Left)  Patient Location: PACU  Anesthesia Type:General  Level of Consciousness: awake, alert  and oriented  Airway & Oxygen Therapy: Patient Spontanous Breathing and Patient connected to nasal cannula oxygen  Post-op Assessment: Report given to RN and Post -op Vital signs reviewed and stable  Post vital signs: Reviewed and stable  Last Vitals:  Vitals:   07/03/16 1112 07/03/16 1425  BP: (!) 198/71   Pulse: 87   Resp: 18   Temp: 36.8 C (P) 36.2 C    Last Pain:  Vitals:   07/03/16 1140  TempSrc:   PainSc: 10-Worst pain ever      Patients Stated Pain Goal: 1 (07/03/16 1140)  Complications: No apparent anesthesia complications

## 2016-07-03 NOTE — Brief Op Note (Signed)
07/03/2016  1:54 PM  PATIENT:  [name]  71 y.o. female  PRE-OPERATIVE DIAGNOSIS:  RETAINED HARDWARE LEFT LEG  POST-OPERATIVE DIAGNOSIS:  RETAINED HARDWARE LEFT LEG  PROCEDURE:  Procedure(s): LEFT LEG HARDWARE REMOVAL, REMOVAL OF PINS  SURGEON:  Surgeon(s): Cammy CopaScott Gregory Dean, MD  ASSISTANT: Patrick Jupiterarla Bethune  ANESTHESIA:   general  EBL: 5 ml    No intake/output data recorded.  BLOOD ADMINISTERED: none  DRAINS: none   LOCAL MEDICATIONS USED:  none  SPECIMEN:  No Specimen  COUNTS:  YES  TOURNIQUET:  * No tourniquets in log *  DICTATION: .Other Dictation: Dictation Number [id number]  PLAN OF CARE: Discharge to home after PACU  PATIENT DISPOSITION:  PACU - hemodynamically stable

## 2016-07-03 NOTE — Anesthesia Preprocedure Evaluation (Addendum)
Anesthesia Evaluation  Patient identified by MRN, date of birth, ID band Patient awake    Reviewed: Allergy & Precautions, NPO status , Patient's Chart, lab work & pertinent test results  History of Anesthesia Complications (+) PONV and history of anesthetic complications  Airway Mallampati: II  TM Distance: >3 FB Neck ROM: Full    Dental  (+) Teeth Intact, Dental Advisory Given   Pulmonary asthma , former smoker,    breath sounds clear to auscultation       Cardiovascular hypertension, + CAD, + Peripheral Vascular Disease and +CHF   Rhythm:Regular Rate:Normal     Neuro/Psych    GI/Hepatic GERD  ,  Endo/Other  diabetesMorbid obesity  Renal/GU      Musculoskeletal   Abdominal (+) + obese,   Peds  Hematology  (+) anemia , REFUSES BLOOD PRODUCTS, JEHOVAH'S WITNESSShe is anemic and Jehovahs witness    Anesthesia Other Findings   Reproductive/Obstetrics                           Anesthesia Physical Anesthesia Plan  ASA: IV  Anesthesia Plan: General   Post-op Pain Management:    Induction: Intravenous  Airway Management Planned: Oral ETT  Additional Equipment:   Intra-op Plan:   Post-operative Plan: Extubation in OR  Informed Consent: I have reviewed the patients History and Physical, chart, labs and discussed the procedure including the risks, benefits and alternatives for the proposed anesthesia with the patient or authorized representative who has indicated his/her understanding and acceptance.     Plan Discussed with:   Anesthesia Plan Comments:        Anesthesia Quick Evaluation

## 2016-07-03 NOTE — Anesthesia Procedure Notes (Signed)
Procedure Name: Intubation Date/Time: 07/03/2016 1:34 PM Performed by: Candis Shine Pre-anesthesia Checklist: Patient identified, Emergency Drugs available, Suction available and Patient being monitored Patient Re-evaluated:Patient Re-evaluated prior to inductionOxygen Delivery Method: Circle System Utilized Preoxygenation: Pre-oxygenation with 100% oxygen Intubation Type: IV induction Ventilation: Mask ventilation without difficulty Laryngoscope Size: Mac and 3 Grade View: Grade I Tube type: Oral Tube size: 7.5 mm Number of attempts: 1 Airway Equipment and Method: Stylet and Oral airway Placement Confirmation: ETT inserted through vocal cords under direct vision,  positive ETCO2 and breath sounds checked- equal and bilateral Tube secured with: Tape Dental Injury: Teeth and Oropharynx as per pre-operative assessment

## 2016-07-03 NOTE — H&P (Signed)
[name] is an 71 y.o. female.   Chief Complaint: Retained hardware left leg HPI: [name] is a 71 year old patient who is about 3 weeks out from fixation and pinning of distal femur fracture.  She has a below-the-knee amputation as well as multiple other medical comorbidities.  She presents now for removal of pins which are spanning the knee joint.  Patient is nonambulatory.  Past Medical History:  Diagnosis Date  . Anxiety   . Asthma   . CAD (coronary artery disease)    s/p MI x 2, s/p stent x 4 (05/2005 with DES placed at that time)  . Carpal tunnel syndrome, bilateral   . Chronic systolic congestive heart failure ([place])    EF 35-40% per 2D echo (03/2011)  . Constipation   . Diabetes mellitus type 2, controlled, with complications ([place])    diet controlled  . Diabetic retinopathy   . ESRD (end stage renal disease) on dialysis [facility]) HD since 07/2008   [facility]; Mon, [place], Friday  . Fibromyalgia   . Hemorrhoids   . Hyperlipidemia   . Hypertension   . Hypothyroid   . Major depression   . Migraines   . Morphea   . Normocytic anemia    BL Hgb 10-12  //  Chronic, likely multifactorial AOCD and possible iron deficiency component with transferrin sat < 20 historically.  . Osteoporosis   . PAD (peripheral artery disease) ([facility])    s/p L BKA  . Peripheral neuropathy ([place])   . PONV (postoperative nausea and vomiting)   . Pulmonary hypertension    PA Peak pressure 57 mmHg per 2D echo (03/2011)  . Refusal of blood transfusions as patient is Jehovah's Witness   . Secondary hyperparathyroidism ([place])   . Vitamin D deficiency     Past Surgical History:  Procedure Laterality Date  . AV FISTULA PLACEMENT  ~ 2011   right upper arm  . BYPASS GRAFT  2006  . CARDIAC CATHETERIZATION  2006, 2007   pt had 4 stents placed  . CATARACT EXTRACTION, BILATERAL Bilateral 2000  . CHOLECYSTECTOMY  1987  . HEMIARTHROPLASTY HIP Left 10/2009  . JOINT REPLACEMENT  2007   Partial placement Left Hip   . LEG AMPUTATION BELOW KNEE Left 10/2008   left  . ORIF FEMUR FRACTURE Left 06/09/2016   Procedure: CLOSED REDUCTION,  CANNULATED SCREWS PINNING;  Surgeon: Meredith Pel, MD;  Location: [place];  Service: Orthopedics;  Laterality: Left;  . PERIPHERAL ARTERIAL STENT GRAFT Bilateral    bilaterally "(left ~ 2010; right ~ 2011"  . VAGINAL HYSTERECTOMY  1994   with BSO    Family History  Problem Relation Age of Onset  . Alcohol abuse Father   . Multiple myeloma Mother   . Diabetes type II Mother   . Hypertension Mother   . Cancer Mother   . Diabetes Mother   . Appendicitis Brother    Social History:  reports that she quit smoking about 27 years ago. Her smoking use included Cigarettes. She has a 60.00 pack-year smoking history. She has never used smokeless tobacco. She reports that she does not drink alcohol or use drugs.  Allergies:  Allergies  Allergen Reactions  . Other Anaphylaxis    PEACHES  . Pumpkin Seed Oil-Saw Palmetto-Zinc [Propalmex] Anaphylaxis  . Shrimp [Shellfish Allergy] Anaphylaxis  . Morphine And Related Other (See Comments)    Pt fell into deep sleep and could not be woken up had to be resuscitated says she  did not overdose, unsure whether throat swelled or SOB pt does not remember  . Penicillins     Tongue swelling.     Medications Prior to Admission  Medication Sig Dispense Refill  . ALPRAZolam (XANAX) 0.25 MG tablet Take 0.25-0.5 mg by mouth 3 (three) times daily as needed for anxiety.     Marland Kitchen aspirin 81 MG chewable tablet Chew 81 mg by mouth daily.    . calcium carbonate (TUMS - DOSED IN MG ELEMENTAL CALCIUM) 500 MG chewable tablet Chew 2 tablets by mouth 3 (three) times daily.    . cetirizine (ZYRTEC) 10 MG tablet Take 10 mg by mouth daily.      . clonazePAM (KLONOPIN) 1 MG tablet Take 1 mg by mouth at bedtime.     Marland Kitchen escitalopram (LEXAPRO) 20 MG tablet Take 20 mg by mouth daily.    Marland Kitchen lanthanum (FOSRENOL) 500 MG chewable tablet Chew 1 tablet (500 mg total)  by mouth 3 (three) times daily with meals. 30 tablet 3  . levothyroxine (SYNTHROID, LEVOTHROID) 100 MCG tablet Take 100 mcg by mouth daily.     Marland Kitchen loperamide (IMODIUM) 2 MG capsule Take 2 mg by mouth 4 (four) times daily as needed for diarrhea or loose stools.     . lovastatin (MEVACOR) 20 MG tablet Take 20 mg by mouth at bedtime.     . metoprolol succinate (TOPROL-XL) 25 MG 24 hr tablet Take 25 mg by mouth daily.     . nortriptyline (PAMELOR) 25 MG capsule Take 25 mg by mouth at bedtime.    Marland Kitchen oxyCODONE-acetaminophen (PERCOCET/ROXICET) 5-325 MG tablet Take 1-2 tablets by mouth every 6 (six) hours as needed for moderate pain or severe pain. (Patient taking differently: Take 2 tablets by mouth every 6 (six) hours as needed for moderate pain or severe pain. ) 30 tablet 0  . polyethylene glycol (MIRALAX / GLYCOLAX) packet Take 17 g by mouth daily as needed for mild constipation or moderate constipation. 14 each 0  . senna-docusate (SENOKOT-S) 8.6-50 MG tablet Take 2 tablets by mouth 2 (two) times daily.    . valsartan (DIOVAN) 80 MG tablet Take 80 mg by mouth daily.    . clopidogrel (PLAVIX) 75 MG tablet Take 75 mg by mouth daily.    . diclofenac sodium (VOLTAREN) 1 % GEL Apply 2 g topically 4 (four) times daily as needed (for carpal tunnel syndrome pain).     . hydrOXYzine (ATARAX/VISTARIL) 25 MG tablet Take 25 mg by mouth every 6 (six) hours as needed for itching.     Marland Kitchen oseltamivir (TAMIFLU) 30 MG capsule Take 30 mg by mouth See admin instructions. 1 dose on 06/25/16, 06/27/16, 07/02/16, & 07/07/16 then discontinue use.    . promethazine (PHENERGAN) 25 MG tablet Take 25 mg by mouth every 6 (six) hours as needed for nausea or vomiting.       Results for orders placed or performed during the hospital encounter of 07/03/16 (from the past 48 hour(s))  I-STAT 4, (NA,K, GLUC, HGB,HCT)     Status: Abnormal   Collection Time: 07/03/16 11:36 AM  Result Value Ref Range   Sodium 136 135 - 145 mmol/L    Potassium 4.7 3.5 - 5.1 mmol/L   Glucose, Bld 73 65 - 99 mg/dL   HCT 31.0 (L) 36.0 - 46.0 %   Hemoglobin 10.5 (L) 12.0 - 15.0 g/dL   No results found.  Review of Systems  Musculoskeletal: Positive for joint pain.  All other systems reviewed  and are negative.   Blood pressure (!) 198/71, pulse 87, temperature 98.3 F (36.8 C), temperature source Oral, resp. rate 18, height 5' 8" (1.727 m), weight 179 lb 14.4 oz (81.6 kg), SpO2 99 %. Physical Exam  Constitutional: She appears well-developed.  HENT:  Head: Normocephalic.  Eyes: Pupils are equal, round, and reactive to light.  Neck: Normal range of motion.  Cardiovascular: Normal rate.   Respiratory: Effort normal.  Neurological: She is alert.  Skin: Skin is warm.  Psychiatric: She has a normal mood and affect.     Assessment/Plan Impression is retained hardware left leg plan is removal of hardware with recasting of the distal femur in order to prevent recurrent deformity at the below-knee amputation knee site.  Risks and benefits discussed all questions answered  Anderson Malta, MD 07/03/2016, 1:19 PM

## 2016-07-04 ENCOUNTER — Encounter (HOSPITAL_COMMUNITY): Payer: Self-pay | Admitting: Orthopedic Surgery

## 2016-07-04 NOTE — Op Note (Deleted)
  The note originally documented on this encounter has been moved the the encounter in which it belongs.  

## 2016-07-04 NOTE — Op Note (Signed)
NAMMarchelle Folks:  [name], [name]                ACCOUNT NO.:  [id number]  MEDICAL RECORD NO.:  [id number]  LOCATION:                               FACILITY:  [facility]  PHYSICIAN:  Burnard BuntingG. Scott Dean, M.D.    DATE OF BIRTH:  04/27/1946  DATE OF PROCEDURE: DATE OF DISCHARGE:                              OPERATIVE REPORT   PREOPERATIVE DIAGNOSIS:  Retained hardware, left leg.  POSTOPERATIVE DIAGNOSIS:  Retained hardware, left leg.  PROCEDURE:  Removal of hardware, left leg.  SURGEON:  Burnard BuntingG. Scott Dean, M.D.  ASSISTANT:  Patrick Jupiterarla Bethune, RNFA.  INDICATIONS:  [name] is a 71 year old patient who is about 3-1/2 weeks out distal femur fracture fixation, presents now for operative management after explanation of risks and benefits.  PROCEDURE IN DETAIL:  The patient was brought to the operating room where general anesthetic was induced.  Preoperative antibiotics administered.  Time-out was called.  Left leg was prescrubbed and then prepped with ChloraPrep.  A time-out was called.  The medial pin was visible and was removed with a pin puller.  Lateral pin was beneath the skin.  An incision was made in the skin, subcu tissue sharply divided. The pin was visualized and removed.  Fracture felt stable on the distal end of the femur.  Both incisions were thoroughly irrigated and closed loosely using a 3-0 Vicryl suture.  A posterior splint was then placed which was well padded on both the patella and around the pin sites.  The patient tolerated the procedure well without immediate complications, transferred to the recovery room in stable condition.     Burnard BuntingG. Scott Dean, M.D.   ______________________________ Reece AgarG. Dorene GrebeScott Dean, M.D.    GSD/MEDQ  D:  07/03/2016  T:  07/04/2016  Job:  [id number]

## 2016-07-08 DIAGNOSIS — Z969 Presence of functional implant, unspecified: Secondary | ICD-10-CM

## 2016-07-10 ENCOUNTER — Ambulatory Visit (INDEPENDENT_AMBULATORY_CARE_PROVIDER_SITE_OTHER): Payer: Medicare (Managed Care) | Admitting: Orthopedic Surgery

## 2016-07-10 ENCOUNTER — Encounter (INDEPENDENT_AMBULATORY_CARE_PROVIDER_SITE_OTHER): Payer: Self-pay

## 2016-07-10 ENCOUNTER — Ambulatory Visit (INDEPENDENT_AMBULATORY_CARE_PROVIDER_SITE_OTHER): Payer: Medicare (Managed Care)

## 2016-07-10 ENCOUNTER — Encounter (INDEPENDENT_AMBULATORY_CARE_PROVIDER_SITE_OTHER): Payer: Self-pay | Admitting: Orthopedic Surgery

## 2016-07-10 DIAGNOSIS — S72412D Displaced unspecified condyle fracture of lower end of left femur, subsequent encounter for closed fracture with routine healing: Secondary | ICD-10-CM | POA: Diagnosis not present

## 2016-07-10 NOTE — Progress Notes (Signed)
Post-Op Visit Note   Patient: [name]           Date of Birth: [birth year]           MRN: [id number] Visit Date: 07/10/2016 PCP: Tina Maroon, MD   Assessment & Plan:  Chief Complaint:  Chief Complaint  Patient presents with  . Left Leg - Routine Post Op   Visit Diagnoses:  1. Closed displaced fracture of condyle of left femur with routine healing     Plan: [name] is a 71 year old female who is now about a month out from left distal femur fracture fixation on exam the incisions are intact.  Can't detect much in the way of movement at the fracture site.  Plan is for continued splint immobilization for 2 more weeks removal of the splint return to clinic in 4 weeks for final set of x-rays in return.  Follow-Up Instructions: No Follow-up on file.   Orders:  Orders Placed This Encounter  Procedures  . XR Knee 1-2 Views Left   No orders of the defined types were placed in this encounter.   Imaging: Xr Knee 1-2 Views Left  Result Date: 07/10/2016 AP lateral left knee reviewed.  AKA present.  Surgical clips noted.  3 screws traversed distal femoral fracture.  Not much in way of callus formation is present.  Sagittal alignment maintained coronal alignment slight varus no other evidence of hardware complications.   PMFS History: Patient Active Problem List   Diagnosis Date Noted  . Closed displaced fracture of condyle of left femur with routine healing 07/10/2016  . Retained orthopedic hardware   . Hx of BKA, left ([place]) 06/18/2016  . Nontraumatic psoas hematoma   . Anemia   . Closed bicondylar fracture of distal femur, left, initial encounter ([place]) 06/09/2016  . Closed left femoral fracture ([place]) 06/08/2016  . Compression fracture of L4 lumbar vertebra ([place]) 06/07/2016  . Acute respiratory failure ([place]) 08/09/2012  . Altered mental status 08/09/2012  . Narcotic overdose 08/09/2012  . Hyperkalemia 08/09/2012  . Chronic total occlusion of artery of the  extremities ([place]) 10/16/2011  . Peripheral vascular disease, unspecified 10/16/2011  . ESRD (end stage renal disease) on dialysis ([place]) 04/08/2011  . Diabetes mellitus type II 04/08/2011  . Diabetic retinopathy associated with type 2 diabetes mellitus ([place]) 04/08/2011  . GERD (gastroesophageal reflux disease) 04/08/2011  . CAD, multiple vessel 04/08/2011  . PAD (peripheral artery disease) ([place]) 04/08/2011  . CHF (congestive heart failure) ([place]) 04/08/2011  . Hypertension 04/08/2011  . Fibromyalgia 04/08/2011  . Hypothyroid 04/08/2011  . Anxiety 04/08/2011  . Major depression 04/08/2011  . Peripheral neuropathy ([place]) 04/08/2011  . Morphea 04/08/2011  . Osteoporosis 04/08/2011  . Carpal tunnel syndrome on both sides 04/08/2011  . Refusal of blood transfusions as patient is Jehovah's Witness 04/08/2011  . Hyperparathyroidism, secondary ([place]) 04/08/2011  . Vitamin D deficiency 04/08/2011   Past Medical History:  Diagnosis Date  . Anxiety   . Asthma   . CAD (coronary artery disease)    s/p MI x 2, s/p stent x 4 (05/2005 with DES placed at that time)  . Carpal tunnel syndrome, bilateral   . Chronic systolic congestive heart failure ([place])    EF 35-40% per 2D echo (03/2011)  . Constipation   . Diabetes mellitus type 2, controlled, with complications ([place])    diet controlled  . Diabetic retinopathy   . ESRD (end stage renal disease) on dialysis [facility]) HD since 07/2008   [facility], [place], [place]  . Fibromyalgia   . Hemorrhoids   . Hyperlipidemia   . Hypertension   . Hypothyroid   . Major depression   . Migraines   . Morphea   . Normocytic anemia    BL Hgb 10-12  //  Chronic, likely multifactorial AOCD and possible iron deficiency component with transferrin sat < 20 historically.  . Osteoporosis   . PAD (peripheral artery disease) (HCC)    s/p L BKA  . Peripheral neuropathy ([place])   . PONV (postoperative nausea and vomiting)   . Pulmonary hypertension    PA Peak pressure  57 mmHg per 2D echo (03/2011)  . Refusal of blood transfusions as patient is Jehovah's Witness   . Secondary hyperparathyroidism ([place])   . Vitamin D deficiency     Family History  Problem Relation Age of Onset  . Alcohol abuse Father   . Multiple myeloma Mother   . Diabetes type II Mother   . Hypertension Mother   . Cancer Mother   . Diabetes Mother   . Appendicitis Brother     Past Surgical History:  Procedure Laterality Date  . AV FISTULA PLACEMENT  ~ 2011   right upper arm  . BYPASS GRAFT  2006  . CARDIAC CATHETERIZATION  2006, 2007   pt had 4 stents placed  . CATARACT EXTRACTION, BILATERAL Bilateral 2000  . CHOLECYSTECTOMY  1987  . HARDWARE REMOVAL Left 07/03/2016   Procedure: LEFT LEG HARDWARE REMOVAL, REMOVAL OF PINS;  Surgeon: Meredith Pel, MD;  Location: [facility];  Service: Orthopedics;  Laterality: Left;  . HEMIARTHROPLASTY HIP Left 10/2009  . JOINT REPLACEMENT  2007   Partial placement Left Hip  . LEG AMPUTATION BELOW KNEE Left 10/2008   left  . ORIF FEMUR FRACTURE Left 06/09/2016   Procedure: CLOSED REDUCTION,  CANNULATED SCREWS PINNING;  Surgeon: Meredith Pel, MD;  Location: [facility];  Service: Orthopedics;  Laterality: Left;  . PERIPHERAL ARTERIAL STENT GRAFT Bilateral    bilaterally "(left ~ 2010; right ~ 2011"  . VAGINAL HYSTERECTOMY  1994   with BSO   Social History   Occupational History  . retired Unemployed    was [employer], disabled since 2006   Social History Main Topics  . Smoking status: Former Smoker    Packs/day: 2.00    Years: 30.00    Types: Cigarettes    Quit date: 04/07/1989  . Smokeless tobacco: Never Used  . Alcohol use No     Comment: "socially; I'm not an alcoholic"  . Drug use: No  . Sexual activity: Not on file

## 2016-07-29 ENCOUNTER — Telehealth: Payer: Self-pay

## 2016-07-29 ENCOUNTER — Other Ambulatory Visit: Payer: Self-pay | Admitting: Vascular Surgery

## 2016-07-29 DIAGNOSIS — R0989 Other specified symptoms and signs involving the circulatory and respiratory systems: Secondary | ICD-10-CM

## 2016-07-29 NOTE — Telephone Encounter (Signed)
rec'd phone call from [facility] at [facility].  Reported the pt. Is having increased pain of right leg, in addition of right leg weakness and coolness.  Dr. Dorothe PeaKoehler is requesting the April 19th appt. be moved to an earlier date.  Advised will have a Actorcheduler contact [facility] with appt. Options.

## 2016-07-30 ENCOUNTER — Other Ambulatory Visit (HOSPITAL_COMMUNITY): Payer: Medicare (Managed Care)

## 2016-07-30 ENCOUNTER — Encounter (INDEPENDENT_AMBULATORY_CARE_PROVIDER_SITE_OTHER): Payer: Self-pay | Admitting: Orthopedic Surgery

## 2016-07-30 ENCOUNTER — Encounter (INDEPENDENT_AMBULATORY_CARE_PROVIDER_SITE_OTHER): Payer: Self-pay

## 2016-07-30 ENCOUNTER — Ambulatory Visit (INDEPENDENT_AMBULATORY_CARE_PROVIDER_SITE_OTHER): Payer: Medicare (Managed Care)

## 2016-07-30 ENCOUNTER — Ambulatory Visit (INDEPENDENT_AMBULATORY_CARE_PROVIDER_SITE_OTHER): Payer: Medicare (Managed Care) | Admitting: Orthopedic Surgery

## 2016-07-30 ENCOUNTER — Encounter (HOSPITAL_COMMUNITY): Payer: Medicare (Managed Care)

## 2016-07-30 DIAGNOSIS — Z89512 Acquired absence of left leg below knee: Secondary | ICD-10-CM | POA: Diagnosis not present

## 2016-07-30 DIAGNOSIS — Z89511 Acquired absence of right leg below knee: Secondary | ICD-10-CM

## 2016-07-30 NOTE — Progress Notes (Signed)
 Post-Op Visit Note   Patient: [name]           Date of Birth: 02/10/1946           MRN: [id number] Visit Date: 07/30/2016 PCP: KOEHLER,ROBERT NICHOLAS, MD   Assessment & Plan:  Chief Complaint:  Chief Complaint  Patient presents with  . Left Leg - Routine Post Op   Visit Diagnoses:  1. Hx of BKA, right (HCC)   2. Hx of BKA, left (HCC)     Plan:patient is now about 6 weeks out closed reduction per case pinning of distal femur fracture she's doing well without any pain on examination she has about a 20 flexion contracture of the BKA stump.  The cast fell off.  Small area of granulation tissue on the dorsal aspect of the patella.  This is healing over.  Plan is for release at this time.  She has callus formation a stable fracture healing.  She does not weight-bear on that side.  I think for transfers she will be fine.  Follow-up as needed  Follow-Up Instructions: No Follow-up on file.   Orders:  Orders Placed This Encounter  Procedures  . XR Knee 1-2 Views Left   No orders of the defined types were placed in this encounter.   Imaging: Xr Knee 1-2 Views Left  Result Date: 07/30/2016 AP lateral left knee reviewed.  Slight flexion of the distal femoral fracture is noted.  Calcification is present.  Overall coronal alignment is maintained.  No other evidence of hardware complication   PMFS History: Patient Active Problem List   Diagnosis Date Noted  . Hx of BKA, right (HCC) 07/30/2016  . Closed displaced fracture of condyle of left femur with routine healing 07/10/2016  . Retained orthopedic hardware   . Hx of BKA, left (HCC) 06/18/2016  . Nontraumatic psoas hematoma   . Anemia   . Closed bicondylar fracture of distal femur, left, initial encounter (HCC) 06/09/2016  . Closed left femoral fracture (HCC) 06/08/2016  . Compression fracture of L4 lumbar vertebra (HCC) 06/07/2016  . Acute respiratory failure (HCC) 08/09/2012  . Altered mental status 08/09/2012  .  Narcotic overdose 08/09/2012  . Hyperkalemia 08/09/2012  . 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 Peripheral vascular disease, unspecified 10/16/2011  . ESRD (end stage renal disease) on dialysis (HCC) 04/08/2011  . Diabetes mellitus type II 04/08/2011  . Diabetic retinopathy associated with type 2 diabetes mellitus (HCC) 04/08/2011  . GERD (gastroesophageal reflux disease) 04/08/2011  . CAD, multiple vessel 04/08/2011  . PAD (peripheral artery disease) (HCC) 04/08/2011  . CHF (congestive heart failure) (HCC) 04/08/2011  . Hypertension 04/08/2011  . Fibromyalgia 04/08/2011  . Hypothyroid 04/08/2011  . Anxiety 04/08/2011  . Major depression 04/08/2011  . Peripheral neuropathy (HCC) 04/08/2011  . Morphea 04/08/2011  . Osteoporosis 04/08/2011  . Carpal tunnel syndrome on both sides 04/08/2011  . Refusal of blood transfusions as patient is Jehovah's Witness 04/08/2011  . Hyperparathyroidism, secondary (HCC) 04/08/2011  . Vitamin D deficiency 04/08/2011   Past Medical History:  Diagnosis Date  . Anxiety   . Asthma   . CAD (coronary artery disease)    s/p MI x 2, s/p stent x 4 (05/2005 with DES placed at that time)  . Carpal tunnel syndrome, bilateral   . Chronic systolic congestive heart failure (HCC)    EF 35-40% per 2D echo (03/2011)  . Constipation   . Diabetes mellitus type 2, controlled,   with complications (HCC)    diet controlled  . Diabetic retinopathy   . ESRD (end stage renal disease) on dialysis (HCC) HD since 07/2008   [place]; [place], Wed, Friday  . Fibromyalgia   . Hemorrhoids   . Hyperlipidemia   . Hypertension   . Hypothyroid   . Major depression   . Migraines   . Morphea   . Normocytic anemia    BL Hgb 10-12  //  Chronic, likely multifactorial AOCD and possible iron deficiency component with transferrin sat < 20 historically.  . Osteoporosis   . PAD (peripheral artery disease) (HCC)    s/p L BKA  . Peripheral neuropathy  (HCC)   . PONV (postoperative nausea and vomiting)   . Pulmonary hypertension    PA Peak pressure 57 mmHg per 2D echo (03/2011)  . Refusal of blood transfusions as patient is Jehovah's Witness   . Secondary hyperparathyroidism (HCC)   . Vitamin D deficiency     Family History  Problem Relation Age of Onset  . Alcohol abuse Father   . Multiple myeloma Mother   . Diabetes type II Mother   . Hypertension Mother   . Cancer Mother   . Diabetes Mother   . Appendicitis Brother     Past Surgical History:  Procedure Laterality Date  . AV FISTULA PLACEMENT  ~ 2011   right upper arm  . BYPASS GRAFT  2006  . CARDIAC CATHETERIZATION  2006, 2007   pt had 4 stents placed  . CATARACT EXTRACTION, BILATERAL Bilateral 2000  . CHOLECYSTECTOMY  1987  . HARDWARE REMOVAL Left 07/03/2016   Procedure: LEFT LEG HARDWARE REMOVAL, REMOVAL OF PINS;  Surgeon: Scott  , MD;  Location: [facility];  Service: Orthopedics;  Laterality: Left;  . HEMIARTHROPLASTY HIP Left 10/2009  . JOINT REPLACEMENT  2007   Partial placement Left Hip  . LEG AMPUTATION BELOW KNEE Left 10/2008   left  . ORIF FEMUR FRACTURE Left 06/09/2016   Procedure: CLOSED REDUCTION,  CANNULATED SCREWS PINNING;  Surgeon: Scott  , MD;  Location: [facility];  Service: Orthopedics;  Laterality: Left;  . PERIPHERAL ARTERIAL STENT GRAFT Bilateral    bilaterally "(left ~ 2010; right ~ 2011"  . VAGINAL HYSTERECTOMY  1994   with BSO   Social History   Occupational History  . 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 Smoking status: Former Smoker    Packs/day: 2.00    Years: 30.00    Types: Cigarettes    Quit date: 04/07/1989  . Smokeless tobacco: Never Used  . Alcohol use No     Comment: "socially; I'm not an alcoholic"  . Drug use: No  . Sexual activity: Not on file    

## 2016-07-30 NOTE — Telephone Encounter (Signed)
Spoke to [name] pt moved to 3/15

## 2016-07-31 ENCOUNTER — Ambulatory Visit: Payer: Medicare (Managed Care) | Admitting: Family

## 2016-08-07 ENCOUNTER — Ambulatory Visit (HOSPITAL_COMMUNITY)
Admission: RE | Admit: 2016-08-07 | Discharge: 2016-08-07 | Disposition: A | Discharge status: Home / Self Care | Payer: Medicare (Managed Care) | Source: Ambulatory Visit | Attending: Vascular Surgery | Admitting: Vascular Surgery

## 2016-08-07 ENCOUNTER — Ambulatory Visit (INDEPENDENT_AMBULATORY_CARE_PROVIDER_SITE_OTHER)
Admission: RE | Admit: 2016-08-07 | Discharge: 2016-08-07 | Disposition: A | Discharge status: Home / Self Care | Payer: Medicare (Managed Care) | Source: Ambulatory Visit | Attending: Vascular Surgery | Admitting: Vascular Surgery

## 2016-08-07 ENCOUNTER — Ambulatory Visit (INDEPENDENT_AMBULATORY_CARE_PROVIDER_SITE_OTHER): Payer: Medicare (Managed Care) | Admitting: Vascular Surgery

## 2016-08-07 ENCOUNTER — Encounter: Payer: Self-pay | Admitting: Vascular Surgery

## 2016-08-07 VITALS — BP 183/83 | HR 81 | Temp 97.6°F | Resp 98 | Ht 68.0 in | Wt 179.0 lb

## 2016-08-07 DIAGNOSIS — Z95828 Presence of other vascular implants and grafts: Secondary | ICD-10-CM | POA: Diagnosis not present

## 2016-08-07 DIAGNOSIS — R0989 Other specified symptoms and signs involving the circulatory and respiratory systems: Secondary | ICD-10-CM | POA: Diagnosis present

## 2016-08-07 DIAGNOSIS — Z89512 Acquired absence of left leg below knee: Secondary | ICD-10-CM

## 2016-08-07 DIAGNOSIS — I739 Peripheral vascular disease, unspecified: Secondary | ICD-10-CM | POA: Diagnosis not present

## 2016-08-07 DIAGNOSIS — Z89511 Acquired absence of right leg below knee: Secondary | ICD-10-CM | POA: Diagnosis not present

## 2016-08-07 NOTE — Progress Notes (Signed)
Patient is a 71 year old female who presents today for intermittent pain and coolness in her right foot. She fell and had a hip fracture several months ago. She has previously had a right femoral to below-knee popliteal bypass in 2011 but had been lost to follow-up. She has also had a previous left above-knee amputation. She currently is undergoing physical therapy after her recent fall.  Review of systems: Patient has end-stage renal disease and is on dialysis. She also has diabetes and hypertension which are been stable.   Physical exam:  Vitals:   08/07/16 1202  BP: (!) 183/83  Pulse: 81  Resp: (!) 98  Temp: 97.6 F (36.4 C)  TempSrc: Oral  Weight: 179 lb (81.2 kg)  Height: 5\' 8"  (1.727 m)    Extremities: Right lower extremity 2+ dorsalis pedis pulse 2+ popliteal pulse   Data: Patient had a right leg ABI performed today which was noncompressible. However the toe pressure on the right side was 175 and triphasic waveforms. She also had a duplex ultrasound of her right leg bypass graft which was widely patent without any evidence of narrowing.  Assessment: Patient with palpable pulse in her right foot widely patent bypass graft by duplex ultrasound with toe pressures adequate for any wound healing. I do not believe her current symptoms are related to her peripheral arterial disease.  Plan: The patient will follow-up in one year with repeat ABIs in a duplex of her graft. She'll return sooner she has any new or different symptoms or nonhealing wounds.  Fabienne Brunsharles Lucina Betty, MD [facility] Office: [phone] Pager: [phone]

## 2016-08-08 ENCOUNTER — Telehealth (INDEPENDENT_AMBULATORY_CARE_PROVIDER_SITE_OTHER): Payer: Self-pay | Admitting: Orthopedic Surgery

## 2016-08-08 NOTE — Addendum Note (Signed)
Addended by: Burton ApleyPETTY, Tameria Patti A on: 08/08/2016 02:38 PM   Modules accepted: Orders

## 2016-08-08 NOTE — Telephone Encounter (Signed)
Tina BalmSusan Varga (PT) with [facility] called needing clarification on weight baring status for the patient. The number to contact her is [phone]

## 2016-08-08 NOTE — Telephone Encounter (Signed)
Spoke with Darl PikesSusan the therapist and she is needing clarification. I read to her what your last note had stated about patient being NWTB on the left side. However, patient will be going home in the next week or 2. She stated that patient previously would kneel at home to do light house work such as putting dishes away. Will it be ok for her to do things such as this or strictly NWTB? Please advise.

## 2016-08-11 NOTE — Telephone Encounter (Signed)
Non weight bearing for 4 more weeks.

## 2016-08-11 NOTE — Telephone Encounter (Signed)
Called and left message for therapist, Darl PikesSusan on wtb status per Dr Alfonso Patteneans note. Advised her she could call with any further questions

## 2016-08-14 ENCOUNTER — Ambulatory Visit (INDEPENDENT_AMBULATORY_CARE_PROVIDER_SITE_OTHER): Payer: Medicare (Managed Care) | Admitting: Orthopedic Surgery

## 2016-09-11 ENCOUNTER — Ambulatory Visit: Payer: Medicare (Managed Care) | Admitting: Vascular Surgery

## 2016-09-11 ENCOUNTER — Other Ambulatory Visit (HOSPITAL_COMMUNITY): Payer: Medicare (Managed Care)

## 2016-09-11 ENCOUNTER — Encounter (HOSPITAL_COMMUNITY): Payer: Medicare (Managed Care)

## 2016-09-12 ENCOUNTER — Emergency Department (HOSPITAL_COMMUNITY): Payer: Medicare (Managed Care)

## 2016-09-12 ENCOUNTER — Observation Stay (HOSPITAL_COMMUNITY): Payer: Medicare (Managed Care)

## 2016-09-12 ENCOUNTER — Encounter (HOSPITAL_COMMUNITY): Payer: Self-pay | Admitting: *Deleted

## 2016-09-12 ENCOUNTER — Inpatient Hospital Stay (HOSPITAL_COMMUNITY)
Admission: EM | Admit: 2016-09-12 | Discharge: 2016-09-17 | DRG: 280 | Disposition: A | Discharge status: Home Health | Payer: Medicare (Managed Care) | Attending: Internal Medicine | Admitting: Internal Medicine

## 2016-09-12 DIAGNOSIS — T82855A Stenosis of coronary artery stent, initial encounter: Secondary | ICD-10-CM | POA: Diagnosis present

## 2016-09-12 DIAGNOSIS — M810 Age-related osteoporosis without current pathological fracture: Secondary | ICD-10-CM | POA: Diagnosis present

## 2016-09-12 DIAGNOSIS — D631 Anemia in chronic kidney disease: Secondary | ICD-10-CM

## 2016-09-12 DIAGNOSIS — Z9842 Cataract extraction status, left eye: Secondary | ICD-10-CM

## 2016-09-12 DIAGNOSIS — Z951 Presence of aortocoronary bypass graft: Secondary | ICD-10-CM

## 2016-09-12 DIAGNOSIS — E1142 Type 2 diabetes mellitus with diabetic polyneuropathy: Secondary | ICD-10-CM | POA: Diagnosis present

## 2016-09-12 DIAGNOSIS — Z79899 Other long term (current) drug therapy: Secondary | ICD-10-CM

## 2016-09-12 DIAGNOSIS — E875 Hyperkalemia: Secondary | ICD-10-CM | POA: Diagnosis present

## 2016-09-12 DIAGNOSIS — I214 Non-ST elevation (NSTEMI) myocardial infarction: Secondary | ICD-10-CM

## 2016-09-12 DIAGNOSIS — R079 Chest pain, unspecified: Secondary | ICD-10-CM

## 2016-09-12 DIAGNOSIS — K219 Gastro-esophageal reflux disease without esophagitis: Secondary | ICD-10-CM

## 2016-09-12 DIAGNOSIS — I25110 Atherosclerotic heart disease of native coronary artery with unstable angina pectoris: Secondary | ICD-10-CM | POA: Diagnosis present

## 2016-09-12 DIAGNOSIS — I5023 Acute on chronic systolic (congestive) heart failure: Secondary | ICD-10-CM | POA: Diagnosis present

## 2016-09-12 DIAGNOSIS — I509 Heart failure, unspecified: Secondary | ICD-10-CM

## 2016-09-12 DIAGNOSIS — Z96642 Presence of left artificial hip joint: Secondary | ICD-10-CM | POA: Diagnosis present

## 2016-09-12 DIAGNOSIS — I252 Old myocardial infarction: Secondary | ICD-10-CM

## 2016-09-12 DIAGNOSIS — R0600 Dyspnea, unspecified: Secondary | ICD-10-CM

## 2016-09-12 DIAGNOSIS — Z89512 Acquired absence of left leg below knee: Secondary | ICD-10-CM

## 2016-09-12 DIAGNOSIS — Z992 Dependence on renal dialysis: Secondary | ICD-10-CM

## 2016-09-12 DIAGNOSIS — E039 Hypothyroidism, unspecified: Secondary | ICD-10-CM

## 2016-09-12 DIAGNOSIS — L299 Pruritus, unspecified: Secondary | ICD-10-CM | POA: Diagnosis not present

## 2016-09-12 DIAGNOSIS — Z66 Do not resuscitate: Secondary | ICD-10-CM | POA: Diagnosis present

## 2016-09-12 DIAGNOSIS — E1122 Type 2 diabetes mellitus with diabetic chronic kidney disease: Secondary | ICD-10-CM | POA: Diagnosis present

## 2016-09-12 DIAGNOSIS — D649 Anemia, unspecified: Secondary | ICD-10-CM | POA: Diagnosis present

## 2016-09-12 DIAGNOSIS — I132 Hypertensive heart and chronic kidney disease with heart failure and with stage 5 chronic kidney disease, or end stage renal disease: Secondary | ICD-10-CM | POA: Diagnosis present

## 2016-09-12 DIAGNOSIS — Z880 Allergy status to penicillin: Secondary | ICD-10-CM

## 2016-09-12 DIAGNOSIS — F419 Anxiety disorder, unspecified: Secondary | ICD-10-CM | POA: Diagnosis present

## 2016-09-12 DIAGNOSIS — E1151 Type 2 diabetes mellitus with diabetic peripheral angiopathy without gangrene: Secondary | ICD-10-CM | POA: Diagnosis present

## 2016-09-12 DIAGNOSIS — I272 Pulmonary hypertension, unspecified: Secondary | ICD-10-CM | POA: Diagnosis present

## 2016-09-12 DIAGNOSIS — I2510 Atherosclerotic heart disease of native coronary artery without angina pectoris: Secondary | ICD-10-CM | POA: Diagnosis present

## 2016-09-12 DIAGNOSIS — M797 Fibromyalgia: Secondary | ICD-10-CM | POA: Diagnosis present

## 2016-09-12 DIAGNOSIS — J45909 Unspecified asthma, uncomplicated: Secondary | ICD-10-CM | POA: Diagnosis present

## 2016-09-12 DIAGNOSIS — E11319 Type 2 diabetes mellitus with unspecified diabetic retinopathy without macular edema: Secondary | ICD-10-CM | POA: Diagnosis present

## 2016-09-12 DIAGNOSIS — Z87891 Personal history of nicotine dependence: Secondary | ICD-10-CM

## 2016-09-12 DIAGNOSIS — I200 Unstable angina: Secondary | ICD-10-CM

## 2016-09-12 DIAGNOSIS — Z9841 Cataract extraction status, right eye: Secondary | ICD-10-CM

## 2016-09-12 DIAGNOSIS — Y831 Surgical operation with implant of artificial internal device as the cause of abnormal reaction of the patient, or of later complication, without mention of misadventure at the time of the procedure: Secondary | ICD-10-CM | POA: Diagnosis present

## 2016-09-12 DIAGNOSIS — N186 End stage renal disease: Secondary | ICD-10-CM | POA: Diagnosis present

## 2016-09-12 DIAGNOSIS — N2581 Secondary hyperparathyroidism of renal origin: Secondary | ICD-10-CM | POA: Diagnosis present

## 2016-09-12 DIAGNOSIS — Z7982 Long term (current) use of aspirin: Secondary | ICD-10-CM

## 2016-09-12 DIAGNOSIS — F329 Major depressive disorder, single episode, unspecified: Secondary | ICD-10-CM | POA: Diagnosis present

## 2016-09-12 DIAGNOSIS — I160 Hypertensive urgency: Secondary | ICD-10-CM

## 2016-09-12 DIAGNOSIS — I10 Essential (primary) hypertension: Secondary | ICD-10-CM | POA: Diagnosis present

## 2016-09-12 DIAGNOSIS — E785 Hyperlipidemia, unspecified: Secondary | ICD-10-CM | POA: Diagnosis present

## 2016-09-12 LAB — CBC WITH DIFFERENTIAL/PLATELET
Basophils Absolute: 0 10*3/uL (ref 0.0–0.1)
Basophils Relative: 0 %
Eosinophils Absolute: 0.2 10*3/uL (ref 0.0–0.7)
Eosinophils Relative: 2 %
HEMATOCRIT: 38.1 % (ref 36.0–46.0)
HEMOGLOBIN: 11.8 g/dL — AB (ref 12.0–15.0)
LYMPHS ABS: 1.2 10*3/uL (ref 0.7–4.0)
LYMPHS PCT: 15 %
MCH: 30.1 pg (ref 26.0–34.0)
MCHC: 31 g/dL (ref 30.0–36.0)
MCV: 97.2 fL (ref 78.0–100.0)
MONOS PCT: 7 %
Monocytes Absolute: 0.5 10*3/uL (ref 0.1–1.0)
NEUTROS ABS: 5.7 10*3/uL (ref 1.7–7.7)
NEUTROS PCT: 76 %
Platelets: 149 10*3/uL — ABNORMAL LOW (ref 150–400)
RBC: 3.92 MIL/uL (ref 3.87–5.11)
RDW: 20.7 % — ABNORMAL HIGH (ref 11.5–15.5)
WBC: 7.6 10*3/uL (ref 4.0–10.5)

## 2016-09-12 LAB — COMPREHENSIVE METABOLIC PANEL
ALBUMIN: 3.2 g/dL — AB (ref 3.5–5.0)
ALT: 172 U/L — ABNORMAL HIGH (ref 14–54)
ANION GAP: 20 — AB (ref 5–15)
AST: 90 U/L — AB (ref 15–41)
Alkaline Phosphatase: 116 U/L (ref 38–126)
BILIRUBIN TOTAL: 2.2 mg/dL — AB (ref 0.3–1.2)
BUN: 25 mg/dL — AB (ref 6–20)
CHLORIDE: 90 mmol/L — AB (ref 101–111)
CO2: 24 mmol/L (ref 22–32)
Calcium: 8.3 mg/dL — ABNORMAL LOW (ref 8.9–10.3)
Creatinine, Ser: 4.7 mg/dL — ABNORMAL HIGH (ref 0.44–1.00)
GFR calc Af Amer: 10 mL/min — ABNORMAL LOW (ref >60)
GFR, EST NON AFRICAN AMERICAN: 9 mL/min — AB (ref >60)
GLUCOSE: 71 mg/dL (ref 65–99)
POTASSIUM: 5.4 mmol/L — AB (ref 3.5–5.1)
Sodium: 134 mmol/L — ABNORMAL LOW (ref 135–145)
TOTAL PROTEIN: 6 g/dL — AB (ref 6.5–8.1)

## 2016-09-12 LAB — I-STAT TROPONIN, ED
TROPONIN I, POC: 0.3 ng/mL — AB (ref 0.00–0.08)
TROPONIN I, POC: 0.25 ng/mL — AB (ref 0.00–0.08)

## 2016-09-12 MED ORDER — CALCIUM CARBONATE ANTACID 500 MG PO CHEW
3.0000 | CHEWABLE_TABLET | Freq: Three times a day (TID) | ORAL | Status: DC
  Administered 2016-09-12 – 2016-09-13 (×3): 600 mg via ORAL
  Filled 2016-09-12 (×6): qty 3

## 2016-09-12 MED ORDER — DOCUSATE SODIUM 100 MG PO CAPS
100.0000 mg | ORAL_CAPSULE | Freq: Two times a day (BID) | ORAL | Status: DC
  Administered 2016-09-12 – 2016-09-16 (×8): 100 mg via ORAL
  Filled 2016-09-12 (×9): qty 1

## 2016-09-12 MED ORDER — LEVOTHYROXINE SODIUM 100 MCG PO TABS
100.0000 ug | ORAL_TABLET | Freq: Every day | ORAL | Status: DC
  Administered 2016-09-13 – 2016-09-17 (×5): 100 ug via ORAL
  Filled 2016-09-12 (×2): qty 1
  Filled 2016-09-12: qty 2
  Filled 2016-09-12 (×2): qty 1

## 2016-09-12 MED ORDER — ESCITALOPRAM OXALATE 20 MG PO TABS
30.0000 mg | ORAL_TABLET | Freq: Every day | ORAL | Status: DC
  Administered 2016-09-13 – 2016-09-17 (×5): 30 mg via ORAL
  Filled 2016-09-12 (×6): qty 1

## 2016-09-12 MED ORDER — DOXERCALCIFEROL 4 MCG/2ML IV SOLN
2.0000 ug | INTRAVENOUS | Status: DC
  Administered 2016-09-15 – 2016-09-17 (×2): 2 ug via INTRAVENOUS
  Filled 2016-09-12 (×2): qty 2

## 2016-09-12 MED ORDER — CLONAZEPAM 1 MG PO TABS
1.0000 mg | ORAL_TABLET | Freq: Two times a day (BID) | ORAL | Status: DC | PRN
  Administered 2016-09-12 – 2016-09-17 (×4): 1 mg via ORAL
  Filled 2016-09-12 (×4): qty 1

## 2016-09-12 MED ORDER — IRBESARTAN 150 MG PO TABS
150.0000 mg | ORAL_TABLET | Freq: Every day | ORAL | Status: DC
  Administered 2016-09-12 – 2016-09-17 (×6): 150 mg via ORAL
  Filled 2016-09-12 (×6): qty 1

## 2016-09-12 MED ORDER — HEPARIN SODIUM (PORCINE) 5000 UNIT/ML IJ SOLN
5000.0000 [IU] | Freq: Three times a day (TID) | INTRAMUSCULAR | Status: DC
  Administered 2016-09-12 – 2016-09-15 (×7): 5000 [IU] via SUBCUTANEOUS
  Filled 2016-09-12 (×7): qty 1

## 2016-09-12 MED ORDER — HYDROXYZINE HCL 25 MG PO TABS
25.0000 mg | ORAL_TABLET | Freq: Four times a day (QID) | ORAL | Status: DC | PRN
Start: 2016-09-12 — End: 2016-09-16
  Administered 2016-09-14 – 2016-09-15 (×2): 25 mg via ORAL
  Filled 2016-09-12 (×2): qty 1

## 2016-09-12 MED ORDER — OXYCODONE-ACETAMINOPHEN 5-325 MG PO TABS: 2.0000 | ORAL_TABLET | ORAL | Status: DC | PRN

## 2016-09-12 MED ORDER — ALPRAZOLAM 0.25 MG PO TABS
0.2500 mg | ORAL_TABLET | Freq: Three times a day (TID) | ORAL | Status: DC | PRN
  Administered 2016-09-13 – 2016-09-16 (×5): 0.25 mg via ORAL
  Filled 2016-09-12 (×5): qty 1

## 2016-09-12 MED ORDER — IOPAMIDOL (ISOVUE-370) INJECTION 76%
INTRAVENOUS | Status: AC
  Administered 2016-09-12: 100 mL
  Filled 2016-09-12: qty 100

## 2016-09-12 MED ORDER — ONDANSETRON HCL 4 MG/2ML IJ SOLN
4.0000 mg | Freq: Four times a day (QID) | INTRAMUSCULAR | Status: DC | PRN
  Administered 2016-09-12 – 2016-09-17 (×3): 4 mg via INTRAVENOUS
  Filled 2016-09-12 (×3): qty 2

## 2016-09-12 MED ORDER — LOPERAMIDE HCL 2 MG PO CAPS
2.0000 mg | ORAL_CAPSULE | Freq: Four times a day (QID) | ORAL | Status: DC | PRN
  Administered 2016-09-17: 2 mg via ORAL
  Filled 2016-09-12: qty 1

## 2016-09-12 MED ORDER — SENNOSIDES-DOCUSATE SODIUM 8.6-50 MG PO TABS
2.0000 | ORAL_TABLET | Freq: Two times a day (BID) | ORAL | Status: DC
  Administered 2016-09-12 – 2016-09-16 (×7): 2 via ORAL
  Filled 2016-09-12 (×9): qty 2

## 2016-09-12 MED ORDER — NORTRIPTYLINE HCL 25 MG PO CAPS
25.0000 mg | ORAL_CAPSULE | Freq: Every day | ORAL | Status: DC
  Administered 2016-09-12 – 2016-09-16 (×5): 25 mg via ORAL
  Filled 2016-09-12 (×6): qty 1

## 2016-09-12 MED ORDER — HYDROCODONE-ACETAMINOPHEN 5-325 MG PO TABS
1.0000 | ORAL_TABLET | Freq: Once | ORAL | Status: AC
  Administered 2016-09-12: 1 via ORAL
  Filled 2016-09-12: qty 1

## 2016-09-12 MED ORDER — LANTHANUM CARBONATE 500 MG PO CHEW
500.0000 mg | CHEWABLE_TABLET | Freq: Three times a day (TID) | ORAL | Status: DC
  Administered 2016-09-12: 500 mg via ORAL
  Filled 2016-09-12: qty 1

## 2016-09-12 MED ORDER — ACETAMINOPHEN 325 MG PO TABS
650.0000 mg | ORAL_TABLET | ORAL | Status: DC | PRN
  Administered 2016-09-12 – 2016-09-16 (×7): 650 mg via ORAL
  Filled 2016-09-12 (×6): qty 2

## 2016-09-12 MED ORDER — PRAVASTATIN SODIUM 20 MG PO TABS
20.0000 mg | ORAL_TABLET | Freq: Every day | ORAL | Status: DC
  Administered 2016-09-12 – 2016-09-16 (×5): 20 mg via ORAL
  Filled 2016-09-12 (×5): qty 1

## 2016-09-12 MED ORDER — ASPIRIN 81 MG PO CHEW: 81.0000 mg | CHEWABLE_TABLET | Freq: Four times a day (QID) | ORAL | Status: DC | PRN

## 2016-09-12 MED ORDER — METOPROLOL SUCCINATE ER 25 MG PO TB24
25.0000 mg | ORAL_TABLET | Freq: Two times a day (BID) | ORAL | Status: DC
  Administered 2016-09-12 – 2016-09-16 (×8): 25 mg via ORAL
  Filled 2016-09-12 (×9): qty 1

## 2016-09-12 NOTE — ED Triage Notes (Signed)
Per EMS pt was at dialysis this morning, completed approximately 3 minutes of it and then staff felt she became ashen in color and BP was 164/120.  Dialysis was stopped and EMS called. Pt states she has felt dizzy, weak, and short of breath since last night.  She is a M, W, F dialysis pt and had normal dialysis on Wednesday.

## 2016-09-12 NOTE — ED Notes (Signed)
Pt called out reporting that her dizziness has returned. PA at bedside. Vitals stable. Oxygen placed back in nares. Pt denies pain.

## 2016-09-12 NOTE — ED Notes (Signed)
Pt repositioned for comfort, all sheets changed.

## 2016-09-12 NOTE — ED Notes (Signed)
Pt unable to have CT due to IV- infiltration. Pt back from CT.

## 2016-09-12 NOTE — ED Notes (Signed)
Patient transported to CT 

## 2016-09-12 NOTE — ED Notes (Signed)
ED Provider at bedside. 

## 2016-09-12 NOTE — ED Notes (Signed)
Patient transported to X-ray 

## 2016-09-12 NOTE — H&P (Signed)
History and Physical    [name]:[id number] DOB: 11-12-1945 DOA: 09/12/2016  PCP: Sherian Maroon, MD   Patient coming from: Hemodialysis center  Chief Complaint: Shortness of breath shortness of breath, chest pain   HPI: [name] is a 71 y.o. female with medical history significant of CAD, systolic CHF with EF 02-54%, diabetes mellitus - diet controlled, end-stage renal disease on hemodialysis Monday/Wednesday/Friday, peripheral arterial disease-status post left below knee amputation and status post left hip arthroplasty remotely and left femur fracture requiring rehabilitation in the facility. Patient went to hemodialysis today and suddenly developed sudden onset of dizziness, dyspnea or chest pain. She describes chest pain as localized under her left breast and being sharp, stabbing and intermittent. 3 minutes after the initiation of dialysis she was found to be hypertensive with systolic blood pressure greater than 200 mmHg in addition to the above listed symptoms and transferred to the ED via EMS  Patient denied any flulike symptoms, has no palpitations, myalgia, arthralgia, abdominal pain, nausea vomiting   ED Course: On arrival to the emergency department patient's blood pressure was elevated at 182/105 mmHg, she was tachypneic with respirations of 22, temperature was normal and oxygen saturation was greater than 92% on room air. Blood work demonstrated hemoglobin 11.8 hyperkalemia with potassium 5.4 sodium 134, creatinine 4.70 and BUN 25. First sets of troponin was 0.30 and the second 73 hours was 0.25. EKG demonstrated sinus rhythm with some nonspecific ST changes but there is a large S1, Q 3 and T3 flattening present in this EKG Chest x-ray showed cardiomegaly and vascular congestion, hilar lymphadenopathy  Review of Systems: As per HPI otherwise 10 point review of systems negative.   Ambulatory Status: Uses a wheelchair  Past Medical History:  Diagnosis  Date  . Anxiety   . Asthma   . CAD (coronary artery disease)    s/p MI x 2, s/p stent x 4 (05/2005 with DES placed at that time)  . Carpal tunnel syndrome, bilateral   . Chronic systolic congestive heart failure ([place])    EF 35-40% per 2D echo (03/2011)  . Constipation   . Diabetes mellitus type 2, controlled, with complications ([place])    diet controlled  . Diabetic retinopathy   . ESRD (end stage renal disease) on dialysis [facility]) HD since 07/2008   [facility]; Mon, [place], Friday  . Fibromyalgia   . Hemorrhoids   . Hyperlipidemia   . Hypertension   . Hypothyroid   . Major depression   . Migraines   . Morphea   . Normocytic anemia    BL Hgb 10-12  //  Chronic, likely multifactorial AOCD and possible iron deficiency component with transferrin sat < 20 historically.  . Osteoporosis   . PAD (peripheral artery disease) (HCC)    s/p L BKA  . Peripheral neuropathy ([place])   . PONV (postoperative nausea and vomiting)   . Pulmonary hypertension    PA Peak pressure 57 mmHg per 2D echo (03/2011)  . Refusal of blood transfusions as patient is Jehovah's Witness   . Secondary hyperparathyroidism ([place])   . Vitamin D deficiency     Past Surgical History:  Procedure Laterality Date  . AV FISTULA PLACEMENT  ~ 2011   right upper arm  . BYPASS GRAFT  2006  . CARDIAC CATHETERIZATION  2006, 2007   pt had 4 stents placed  . CATARACT EXTRACTION, BILATERAL Bilateral 2000  . CHOLECYSTECTOMY  1987  . HARDWARE REMOVAL Left 07/03/2016  Procedure: LEFT LEG HARDWARE REMOVAL, REMOVAL OF PINS;  Surgeon: Meredith Pel, MD;  Location: [place];  Service: Orthopedics;  Laterality: Left;  . HEMIARTHROPLASTY HIP Left 10/2009  . JOINT REPLACEMENT  2007   Partial placement Left Hip  . LEG AMPUTATION BELOW KNEE Left 10/2008   left  . ORIF FEMUR FRACTURE Left 06/09/2016   Procedure: CLOSED REDUCTION,  CANNULATED SCREWS PINNING;  Surgeon: Meredith Pel, MD;  Location: [place];  Service: Orthopedics;  Laterality:  Left;  . PERIPHERAL ARTERIAL STENT GRAFT Bilateral    bilaterally "(left ~ 2010; right ~ 2011"  . VAGINAL HYSTERECTOMY  1994   with BSO    Social History   Social History  . Marital status: Divorced    Spouse name: N/A  . Number of children: N/A  . Years of education: N/A   Occupational History  . retired Unemployed    was [employer], disabled since 2006   Social History Main Topics  . Smoking status: Former Smoker    Packs/day: 2.00    Years: 30.00    Types: Cigarettes    Quit date: 04/07/1989  . Smokeless tobacco: Never Used  . Alcohol use No     Comment: "socially; I'm not an alcoholic"  . Drug use: No  . Sexual activity: Not on file   Other Topics Concern  . Not on file   Social History Narrative   Lives with her mother who is ill with lung CA in remission.  The two live in her apartment.  She is a Restaurant manager, fast food.  Enjoys reading, collecting dolls, and working on computer.    Allergies  Allergen Reactions  . Morphine And Related Other (See Comments)    Pt fell into deep sleep and could not be woken up had to be resuscitated says she did not overdose, unsure whether throat swelled or SOB pt does not remember  . Other Anaphylaxis    PEACHES  . Penicillins Swelling    Tongue swelling.   Marland Kitchen Pumpkin Seed Oil-Saw Palmetto-Zinc [Propalmex] Anaphylaxis  . Shrimp [Shellfish Allergy] Anaphylaxis    Family History  Problem Relation Age of Onset  . Alcohol abuse Father   . Multiple myeloma Mother   . Diabetes type II Mother   . Hypertension Mother   . Cancer Mother   . Diabetes Mother   . Appendicitis Brother     Prior to Admission medications   Medication Sig Start Date End Date Taking? Authorizing Provider  acetaminophen (TYLENOL) 325 MG tablet Take 650 mg by mouth every 6 (six) hours as needed for mild pain.   Yes Historical Provider, MD  ALPRAZolam (XANAX) 0.25 MG tablet Take 0.25 mg by mouth 3 (three) times daily as needed for anxiety.    Yes Historical  Provider, MD  aspirin 81 MG chewable tablet Chew 81 mg by mouth every 6 (six) hours as needed for mild pain.    Yes Historical Provider, MD  calcium carbonate (TUMS - DOSED IN MG ELEMENTAL CALCIUM) 500 MG chewable tablet Chew 3 tablets by mouth 3 (three) times daily.    Yes Historical Provider, MD  clonazePAM (KLONOPIN) 1 MG tablet Take 1 mg by mouth 2 (two) times daily as needed for anxiety.    Yes Historical Provider, MD  docusate sodium (COLACE) 100 MG capsule Take 100 mg by mouth 2 (two) times daily.   Yes Historical Provider, MD  escitalopram (LEXAPRO) 20 MG tablet Take 30 mg by mouth daily.    Yes  Historical Provider, MD  hydrOXYzine (ATARAX/VISTARIL) 25 MG tablet Take 25 mg by mouth every 6 (six) hours as needed for itching.    Yes Historical Provider, MD  lanthanum (FOSRENOL) 500 MG chewable tablet Chew 1 tablet (500 mg total) by mouth 3 (three) times daily with meals. 06/12/16  Yes Asencion Partridge, MD  levothyroxine (SYNTHROID, LEVOTHROID) 100 MCG tablet Take 100 mcg by mouth daily.    Yes Historical Provider, MD  loperamide (IMODIUM) 2 MG capsule Take 2 mg by mouth 4 (four) times daily as needed for diarrhea or loose stools.    Yes Historical Provider, MD  lovastatin (MEVACOR) 20 MG tablet Take 20 mg by mouth at bedtime.    Yes Historical Provider, MD  metoprolol succinate (TOPROL-XL) 25 MG 24 hr tablet Take 25 mg by mouth 2 (two) times daily.    Yes Historical Provider, MD  nortriptyline (PAMELOR) 25 MG capsule Take 25 mg by mouth at bedtime.   Yes Historical Provider, MD  oxycodone (OXY-IR) 5 MG capsule Take 5 mg by mouth every 12 (twelve) hours as needed for pain.   Yes Historical Provider, MD  promethazine (PHENERGAN) 25 MG tablet Take 25 mg by mouth every 8 (eight) hours as needed for nausea or vomiting.    Yes Historical Provider, MD  senna-docusate (SENOKOT-S) 8.6-50 MG tablet Take 2 tablets by mouth 2 (two) times daily.   Yes Historical Provider, MD  valsartan (DIOVAN) 80 MG tablet Take  80 mg by mouth daily.   Yes Historical Provider, MD  oxyCODONE-acetaminophen (PERCOCET/ROXICET) 5-325 MG tablet Take 1-2 tablets by mouth every 6 (six) hours as needed for moderate pain or severe pain. Patient not taking: Reported on 09/12/2016 06/12/16   Asencion Partridge, MD  polyethylene glycol [facility] / Floria Raveling) packet Take 17 g by mouth daily as needed for mild constipation or moderate constipation. Patient not taking: Reported on 09/12/2016 06/12/16   Asencion Partridge, MD    Physical Exam: Vitals:   09/12/16 1000 09/12/16 1100 09/12/16 1200 09/12/16 1327  BP: (!) 172/93 (!) 167/74 124/90 (!) 177/92  Pulse: 76 76 73 73  Resp: 20 14 16  (!) 21  TempSrc:      SpO2: 100% 97% 94% 100%     General: Appears calm and comfortable Eyes: PERRLA, EOMI, normal lids, iris ENT:  grossly normal hearing, lips & tongue, mucous membranes moist and intact Neck: no lymphoadenopathy, masses or thyromegaly Cardiovascular: RRR, no m/r/g. No JVD, carotid bruits. Nonpitting RLE edema.  Respiratory: bilateral no wheezes, rales, rhonchi, discrete basilar cracles, but overall diminished breath sounds. Normal respiratory effort. No accessory muscle use observed Abdomen: soft, non-tender, non-distended, no organomegaly or masses appreciated. BS present in all quadrants Skin: no rash, ulcers or induration seen on limited exam Musculoskeletal: grossly normal tone BUE/BLE, good ROM, no bony abnormality or joint deformities observed, status post left LE BKA  Psychiatric: grossly normal mood and affect, speech fluent and appropriate, alert and oriented x3 Neurologic: CN II-XII grossly intact, moves all extremities in coordinated fashion, sensation intact  Labs on Admission: I have personally reviewed following labs and imaging studies  CBC, BMP  GFR: CrCl cannot be calculated (Unknown ideal weight.).   Creatinine Clearance: CrCl cannot be calculated (Unknown ideal weight.).   Radiological Exams on Admission: Dg  Chest 2 View  Result Date: 09/12/2016 CLINICAL DATA:  Dizziness and weakness EXAM: CHEST  2 VIEW COMPARISON:  08/09/2012 FINDINGS: Low volume chest with vascular congestion and prominent hila. Cardiomegaly. No effusion or pneumothorax. No  air bronchogram. IMPRESSION: 1. Cardiomegaly and vascular congestion. 2. Fullness of the hila, likely accentuated by low volumes and #1. Hilar adenopathy is a possibility and two-view chest x-ray is recommended after convalescence. Electronically Signed   By: Monte Fantasia M.D.   On: 09/12/2016 08:07    EKG: Independently reviewed - EKG demonstrated sinus rhythm with some nonspecific ST changes but there is a large S1, Q 3 and T3 flattening present in this EKG  Assessment/Plan Active Problems:   ESRD (end stage renal disease) on dialysis (HCC)   GERD (gastroesophageal reflux disease)   CAD, multiple vessel   CHF (congestive heart failure) (HCC)   Hypertension   Hypothyroid   Anemia   Hypertensive urgency   Dyspnea     Chest pain with elevated troponins and  EKG changes Doubt patient has ACS, suspect he troponin and EKG changes and chest pain as well as shortness of breath could be associated with pulmonary embolism Pulmonary CT with contrast is pending to rule out PE and evaluate the hilar adenopathy Continue to monitor on telemetry, cycle troponin we will obtain new echocardiogram  Dyspnea - most likely d/t acute exacerbation of chronic systolic CHF Patient awaits for hemodialysis that would help to eliminate the extra fluid  Hypertensive urgency and patient was previously known history of hypertension - patient reports her blood pressure is been well controlled prior to this episode Could be associated with chronic pain. Patient was taking hydrocodone that this was changed to oxycodone by PCP, but unfortunately it is not helping with pain In the ED after she received the dose of hydrocodone her blood pressure significantly improved to 167/74 -->  124/74 mmHg Will continue home meds and monitor BP, adjust doses if needed  ESRD - today is her day of HD and renal service notified. Will need HD after pulmonary CT  Hypothyroidism  Continue Synthroid  Anemia - most likely associated with chronic kidney disease Hgb is stable 11.8 and better than two months ago  Depression - continue Lexapro and Amitriptyline  Hyperlippidemia - continue statin therapy  DVT prophylaxis: Heparin Code Status: DNR Family Communication: none Disposition Plan: telemetry Consults called: renal service by EDP Admission status: observation   York Grice, PA-C Pager: [phone] [facility]  If 7PM-7AM, please contact night-coverage [email or web address] Password [email or web address]  09/12/2016, 1:37 PM

## 2016-09-12 NOTE — ED Notes (Signed)
[facility] at bedside.

## 2016-09-12 NOTE — ED Provider Notes (Signed)
[facility] DEPT Provider Note   CSN: [id number] Arrival date & time: 09/12/16  2633     History   Chief Complaint Chief Complaint  Patient presents with  . Hypertension    HPI [name] is a 71 y.o. female with a past medical history of end-stage renal disease, vasculopathy, artery disease status post stent placement, anxiety, hypertension, diabetes, pulmonary hypertension. She is status post left BKA only (her PMH lists a right BKA which is untrue). The patient has had 2 days of worsening shortness of breath. She states that this is constant. Feels that it is intermittent. The patient dialyzes Monday, Wednesday, Friday, and her last dialysis was this past Wednesday. She is able to see receive full treatment. She has not taken her hypertensive medication prior to dialysis. The patient states that while she was at dialysis today. She had an episode of sudden onset dizziness, shortness of breath, and chest pain. She is noted to be hypertensive and EMS was called. Patient was sent here with her dialysis access in place. To have a stabbing pain under her left breast which is intermittent. She states that her shortness of breath, does not feel similar to her previous episodes of CHF. She denies any recent illness, fevers, chills or myalgias. She does make urine and urinates about 4 times a week. She has also noted bruises all over her, but denies taking an anticoagulant, denies melena or hematochezia. The patient denies abdominal pain, nausea, vomiting or diaphoresis. No recent URI symptoms or cough. HPI  Past Medical History:  Diagnosis Date  . Anxiety   . Asthma   . CAD (coronary artery disease)    s/p MI x 2, s/p stent x 4 (05/2005 with DES placed at that time)  . Carpal tunnel syndrome, bilateral   . Chronic systolic congestive heart failure ([place])    EF 35-40% per 2D echo (03/2011)  . Constipation   . Diabetes mellitus type 2, controlled, with complications ([place])    diet  controlled  . Diabetic retinopathy   . ESRD (end stage renal disease) on dialysis [facility]) HD since 07/2008   [facility]; Mon, [place], Friday  . Fibromyalgia   . Hemorrhoids   . Hyperlipidemia   . Hypertension   . Hypothyroid   . Major depression   . Migraines   . Morphea   . Normocytic anemia    BL Hgb 10-12  //  Chronic, likely multifactorial AOCD and possible iron deficiency component with transferrin sat < 20 historically.  . Osteoporosis   . PAD (peripheral artery disease) (HCC)    s/p L BKA  . Peripheral neuropathy ([place])   . PONV (postoperative nausea and vomiting)   . Pulmonary hypertension    PA Peak pressure 57 mmHg per 2D echo (03/2011)  . Refusal of blood transfusions as patient is Jehovah's Witness   . Secondary hyperparathyroidism ([place])   . Vitamin D deficiency     Patient Active Problem List   Diagnosis Date Noted  . Hx of BKA, right ([place]) 07/30/2016  . Closed displaced fracture of condyle of left femur with routine healing 07/10/2016  . Retained orthopedic hardware   . Hx of BKA, left ([place]) 06/18/2016  . Nontraumatic psoas hematoma   . Anemia   . Closed bicondylar fracture of distal femur, left, initial encounter ([place]) 06/09/2016  . Closed left femoral fracture ([place]) 06/08/2016  . Compression fracture of L4 lumbar vertebra ([place]) 06/07/2016  . Acute respiratory failure ([place]) 08/09/2012  .  Altered mental status 08/09/2012  . Narcotic overdose 08/09/2012  . Hyperkalemia 08/09/2012  . Chronic total occlusion of artery of the extremities ([place]) 10/16/2011  . Peripheral vascular disease, unspecified ([place]) 10/16/2011  . ESRD (end stage renal disease) on dialysis ([place]) 04/08/2011  . Diabetes mellitus type II 04/08/2011  . Diabetic retinopathy associated with type 2 diabetes mellitus ([place]) 04/08/2011  . GERD (gastroesophageal reflux disease) 04/08/2011  . CAD, multiple vessel 04/08/2011  . PAD (peripheral artery disease) ([place]) 04/08/2011  . CHF (congestive heart  failure) ([place]) 04/08/2011  . Hypertension 04/08/2011  . Fibromyalgia 04/08/2011  . Hypothyroid 04/08/2011  . Anxiety 04/08/2011  . Major depression 04/08/2011  . Peripheral neuropathy 04/08/2011  . Morphea 04/08/2011  . Osteoporosis 04/08/2011  . Carpal tunnel syndrome on both sides 04/08/2011  . Refusal of blood transfusions as patient is Jehovah's Witness 04/08/2011  . Hyperparathyroidism, secondary ([place]) 04/08/2011  . Vitamin D deficiency 04/08/2011    Past Surgical History:  Procedure Laterality Date  . AV FISTULA PLACEMENT  ~ 2011   right upper arm  . BYPASS GRAFT  2006  . CARDIAC CATHETERIZATION  2006, 2007   pt had 4 stents placed  . CATARACT EXTRACTION, BILATERAL Bilateral 2000  . CHOLECYSTECTOMY  1987  . HARDWARE REMOVAL Left 07/03/2016   Procedure: LEFT LEG HARDWARE REMOVAL, REMOVAL OF PINS;  Surgeon: Meredith Pel, MD;  Location: [facility];  Service: Orthopedics;  Laterality: Left;  . HEMIARTHROPLASTY HIP Left 10/2009  . JOINT REPLACEMENT  2007   Partial placement Left Hip  . LEG AMPUTATION BELOW KNEE Left 10/2008   left  . ORIF FEMUR FRACTURE Left 06/09/2016   Procedure: CLOSED REDUCTION,  CANNULATED SCREWS PINNING;  Surgeon: Meredith Pel, MD;  Location: [facility];  Service: Orthopedics;  Laterality: Left;  . PERIPHERAL ARTERIAL STENT GRAFT Bilateral    bilaterally "(left ~ 2010; right ~ 2011"  . VAGINAL HYSTERECTOMY  1994   with BSO    OB History    No data available       Home Medications    Prior to Admission medications   Medication Sig Start Date End Date Taking? Authorizing Provider  ALPRAZolam (XANAX) 0.25 MG tablet Take 0.25-0.5 mg by mouth 3 (three) times daily as needed for anxiety.     Historical Provider, MD  aspirin 81 MG chewable tablet Chew 81 mg by mouth daily.    Historical Provider, MD  calcium carbonate (TUMS - DOSED IN MG ELEMENTAL CALCIUM) 500 MG chewable tablet Chew 2 tablets by mouth 3 (three) times daily.    Historical Provider, MD    cetirizine (ZYRTEC) 10 MG tablet Take 10 mg by mouth daily.      Historical Provider, MD  clonazePAM (KLONOPIN) 1 MG tablet Take 1 mg by mouth at bedtime.     Historical Provider, MD  clopidogrel (PLAVIX) 75 MG tablet Take 75 mg by mouth daily.    Historical Provider, MD  diclofenac sodium (VOLTAREN) 1 % GEL Apply 2 g topically 4 (four) times daily as needed (for carpal tunnel syndrome pain).     Historical Provider, MD  escitalopram (LEXAPRO) 20 MG tablet Take 20 mg by mouth daily.    Historical Provider, MD  hydrOXYzine (ATARAX/VISTARIL) 25 MG tablet Take 25 mg by mouth every 6 (six) hours as needed for itching.     Historical Provider, MD  lanthanum (FOSRENOL) 500 MG chewable tablet Chew 1 tablet (500 mg total) by mouth 3 (three) times daily with meals. 06/12/16  Asencion Partridge, MD  levothyroxine (SYNTHROID, LEVOTHROID) 100 MCG tablet Take 100 mcg by mouth daily.     Historical Provider, MD  loperamide (IMODIUM) 2 MG capsule Take 2 mg by mouth 4 (four) times daily as needed for diarrhea or loose stools.     Historical Provider, MD  lovastatin (MEVACOR) 20 MG tablet Take 20 mg by mouth at bedtime.     Historical Provider, MD  metoprolol succinate (TOPROL-XL) 25 MG 24 hr tablet Take 25 mg by mouth daily.     Historical Provider, MD  nortriptyline (PAMELOR) 25 MG capsule Take 25 mg by mouth at bedtime.    Historical Provider, MD  oxyCODONE-acetaminophen (PERCOCET/ROXICET) 5-325 MG tablet Take 1-2 tablets by mouth every 6 (six) hours as needed for moderate pain or severe pain. Patient taking differently: Take 2 tablets by mouth every 6 (six) hours as needed for moderate pain or severe pain.  06/12/16   Asencion Partridge, MD  polyethylene glycol [facility] Floria Raveling) packet Take 17 g by mouth daily as needed for mild constipation or moderate constipation. 06/12/16   Asencion Partridge, MD  promethazine (PHENERGAN) 25 MG tablet Take 25 mg by mouth every 6 (six) hours as needed for nausea or vomiting.     Historical  Provider, MD  senna-docusate (SENOKOT-S) 8.6-50 MG tablet Take 2 tablets by mouth 2 (two) times daily.    Historical Provider, MD  valsartan (DIOVAN) 80 MG tablet Take 80 mg by mouth daily.    Historical Provider, MD    Family History Family History  Problem Relation Age of Onset  . Alcohol abuse Father   . Multiple myeloma Mother   . Diabetes type II Mother   . Hypertension Mother   . Cancer Mother   . Diabetes Mother   . Appendicitis Brother     Social History Social History  Substance Use Topics  . Smoking status: Former Smoker    Packs/day: 2.00    Years: 30.00    Types: Cigarettes    Quit date: 04/07/1989  . Smokeless tobacco: Never Used  . Alcohol use No     Comment: "socially; I'm not an alcoholic"     Allergies   Other; Pumpkin seed oil-saw palmetto-zinc [propalmex]; Shrimp [shellfish allergy]; Morphine and related; and Penicillins   Review of Systems Review of Systems  Ten systems reviewed and are negative for acute change, except as noted in the HPI.     Physical Exam Updated Vital Signs BP (!) 179/79   Pulse 75   Resp 17   SpO2 100%   Physical Exam  Constitutional: She is oriented to person, place, and time. She appears well-developed.  She appears chronically ill. She is unable to sit still. She intermittently clutches her chest.  HENT:  Head: Normocephalic and atraumatic.  Dry oral mucosa  Eyes: Conjunctivae and EOM are normal. Pupils are equal, round, and reactive to light. No scleral icterus.  Neck: Normal range of motion. JVD present.  Venous distention noted in face and neck  Cardiovascular: Normal rate, regular rhythm and normal heart sounds.  Exam reveals no gallop and no friction rub.   No murmur heard. 2+ pitting edema  Pulmonary/Chest: Effort normal and breath sounds normal. No respiratory distress.  Abdominal: Soft. Bowel sounds are normal. She exhibits no distension and no mass. There is no tenderness. There is no guarding.    Neurological: She is alert and oriented to person, place, and time.  Skin: Skin is warm and dry. She is not  diaphoretic.  Bruises noted diffusely  Psychiatric: Her behavior is normal.  Nursing note and vitals reviewed.    ED Treatments / Results  Labs (all labs ordered are listed, but only abnormal results are displayed) Labs Reviewed  CBC WITH DIFFERENTIAL/PLATELET  COMPREHENSIVE METABOLIC PANEL  I-STAT [place], ED  I-STAT TROPOININ, ED    EKG  EKG Interpretation None       Radiology No results found.  Procedures Procedures (including critical care time)  Medications Ordered in ED Medications - No data to display   Initial Impression / Assessment and Plan / ED Course  I have reviewed the triage vital signs and the nursing notes.  Pertinent labs & imaging results that were available during my care of the patient were reviewed by me and considered in my medical decision making (see chart for details).  Clinical Course as of Sep 13 1631  Fri Sep 12, 2016  0811 Patient noted to have back pain which is part of her discomfort. She had a medication problem and has been out of her pain medications tor the past 4 days.  [AH]  8527 Patient noted to have an elevated troponin. Her  previous are not elevated, but, her EKG is without changes and she did not have any dialysis today. We will continue to trend her troponin. Troponin i, poc: (!!) 0.30 [AH]  1256 Tropinin is elevated but has not increased. Will obtain a CTA PE given her recent hx of surgery, hospitalization and immobilization. The patient will need dialysis. Troponin i, poc: (!!) 0.25 [AH]    Clinical Course User Index [AH] Margarita Mail, PA-C    Patient with elevated troponins, given her recent hospitalization and she will need a CTA. Patient will be admitted by the hospitalist. I spoken with nephrology who will dialyze the patient after her scan. I doubt cardiac etiology of her elevated troponins in chest pain  today. She needs dialysis for volume overload and her slightly abnormal potassium. Patient has been stable throughout her visit here she is otherwise stable and appropriate for admission at this time  Final Clinical Impressions(s) / ED Diagnoses   Final diagnoses:  Chest pain, unspecified type  ESRD (end stage renal disease) [facility])    New Prescriptions New Prescriptions   No medications on file     Margarita Mail, PA-C 09/12/16 [place], MD 09/14/16 2158

## 2016-09-12 NOTE — Progress Notes (Signed)
Adlai Nieblas is a 71 Y/O female with ESRD on hemodialysis at [facility]. PMH significant for CAD, systolic HF with EF 35-40%, DMT2, PAD, L BKA, recent L fx femur and L hip arthroplasty. Patient presented to HD unit C/O chest pressure and SOB. Patient states she has been having chest pressure/SOB for one week. Says she reported this to [facility]ACE center but not to rounding nephrologist at HD center this week. She was sent to ED for evaluation. She has been admitted for chest pain as observation patient. She will have hemodialysis today if possible but more likely 1st shift in AM D/T scheduling. K+5.4 CXR with vascular congestion/cardiomegaly. Trace RLE edema. Still having chest pressure but does not appear in acute distress. Localizes pressure to epigastric area. Per primary.   Please notify us if patient status upgraded to in-patient and we will consult formally.  Hemodialysis Orders: [facility] MWF 3 hours 45 minute 180 NRe 400/Auto 1.5 79 kg 2.0 K/2.25 Ca Linear Na RUA AVF  Treatment medications: Heparin 8,500 units IV TIW Hectoral 2 mcg IV TIW Mircera 75 mcg IV q 2 weeks (Last dose 09/01/16)   Alonna Buckler, AGNP-C [facility] [phone] (Pager)

## 2016-09-12 NOTE — ED Notes (Signed)
Pt's dialysis site is still accessed. Consult put in for IV team to de-access.

## 2016-09-13 ENCOUNTER — Encounter (HOSPITAL_COMMUNITY): Payer: Self-pay | Admitting: *Deleted

## 2016-09-13 ENCOUNTER — Observation Stay (HOSPITAL_COMMUNITY): Payer: Medicare (Managed Care)

## 2016-09-13 DIAGNOSIS — N186 End stage renal disease: Secondary | ICD-10-CM | POA: Diagnosis not present

## 2016-09-13 DIAGNOSIS — R079 Chest pain, unspecified: Secondary | ICD-10-CM | POA: Diagnosis not present

## 2016-09-13 LAB — RENAL FUNCTION PANEL
Albumin: 2.9 g/dL — ABNORMAL LOW (ref 3.5–5.0)
Anion gap: 18 — ABNORMAL HIGH (ref 5–15)
BUN: 37 mg/dL — ABNORMAL HIGH (ref 6–20)
CO2: 23 mmol/L (ref 22–32)
Calcium: 8.1 mg/dL — ABNORMAL LOW (ref 8.9–10.3)
Chloride: 91 mmol/L — ABNORMAL LOW (ref 101–111)
Creatinine, Ser: 5.76 mg/dL — ABNORMAL HIGH (ref 0.44–1.00)
GFR calc Af Amer: 8 mL/min — ABNORMAL LOW (ref >60)
GFR calc non Af Amer: 7 mL/min — ABNORMAL LOW (ref >60)
Glucose, Bld: 87 mg/dL (ref 65–99)
Phosphorus: 6.5 mg/dL — ABNORMAL HIGH (ref 2.5–4.6)
Potassium: 3.5 mmol/L (ref 3.5–5.1)
Sodium: 132 mmol/L — ABNORMAL LOW (ref 135–145)

## 2016-09-13 LAB — CBC
HCT: 36.9 % (ref 36.0–46.0)
Hemoglobin: 11.5 g/dL — ABNORMAL LOW (ref 12.0–15.0)
MCH: 29.9 pg (ref 26.0–34.0)
MCHC: 31.2 g/dL (ref 30.0–36.0)
MCV: 95.8 fL (ref 78.0–100.0)
Platelets: 131 10*3/uL — ABNORMAL LOW (ref 150–400)
RBC: 3.85 MIL/uL — ABNORMAL LOW (ref 3.87–5.11)
RDW: 20.7 % — ABNORMAL HIGH (ref 11.5–15.5)
WBC: 5.1 10*3/uL (ref 4.0–10.5)

## 2016-09-13 LAB — TROPONIN I: Troponin I: 0.23 ng/mL (ref <0.03)

## 2016-09-13 MED ORDER — HEPARIN SODIUM (PORCINE) 1000 UNIT/ML DIALYSIS: 1000.0000 [IU] | INTRAMUSCULAR | Status: DC | PRN

## 2016-09-13 MED ORDER — HEPARIN SODIUM (PORCINE) 1000 UNIT/ML DIALYSIS
8500.0000 [IU] | Freq: Once | INTRAMUSCULAR | Status: DC
  Filled 2016-09-13: qty 9

## 2016-09-13 MED ORDER — LIDOCAINE HCL (PF) 1 % IJ SOLN: 5.0000 mL | INTRAMUSCULAR | Status: DC | PRN

## 2016-09-13 MED ORDER — IOPAMIDOL (ISOVUE-370) INJECTION 76%
100.0000 mL | Freq: Once | INTRAVENOUS | Status: AC | PRN
  Administered 2016-09-13: 100 mL via INTRAVENOUS

## 2016-09-13 MED ORDER — HYDROCODONE-ACETAMINOPHEN 5-325 MG PO TABS
1.0000 | ORAL_TABLET | Freq: Four times a day (QID) | ORAL | Status: AC | PRN
  Administered 2016-09-13 – 2016-09-14 (×3): 1 via ORAL
  Filled 2016-09-13 (×3): qty 1

## 2016-09-13 MED ORDER — LIDOCAINE-PRILOCAINE 2.5-2.5 % EX CREA
1.0000 "application " | TOPICAL_CREAM | CUTANEOUS | Status: DC | PRN
  Filled 2016-09-13: qty 5

## 2016-09-13 MED ORDER — SODIUM CHLORIDE 0.9 % IV SOLN: 100.0000 mL | INTRAVENOUS | Status: DC | PRN

## 2016-09-13 MED ORDER — PENTAFLUOROPROP-TETRAFLUOROETH EX AERO: 1.0000 "application " | INHALATION_SPRAY | CUTANEOUS | Status: DC | PRN

## 2016-09-13 MED ORDER — ACETAMINOPHEN 325 MG PO TABS
ORAL_TABLET | ORAL | Status: AC
  Administered 2016-09-13: 650 mg via ORAL
  Filled 2016-09-13: qty 2

## 2016-09-13 MED ORDER — ALTEPLASE 2 MG IJ SOLR: 2.0000 mg | Freq: Once | INTRAMUSCULAR | Status: DC | PRN

## 2016-09-13 NOTE — Progress Notes (Signed)
Patient arrived to unit per bed.  Reviewed treatment plan and this RN agrees.  Report received from bedside RN, Carly.  Consent obtained.  Patient A & O X 4. Lung sounds diminished and clear to ausculation in all fields. Generalized non pitting edema. Cardiac: NSR.  Prepped RUAVF with alcohol and cannulated with two 15 gauge needles.  Pulsation of blood noted.  Flushed access well with saline per protocol.  Connected and secured lines and initiated tx at 1159.  UF goal of 1500 mL and net fluid removal of 1000 mL.  Will continue to monitor.

## 2016-09-13 NOTE — Progress Notes (Signed)
Patient ID: [name], female   DOB: Dec 15, 1945, 71 y.o.   MRN: [id number]                                                                PROGRESS NOTE                                                                                                                                                                                                             Patient Demographics:    [name], is a 71 y.o. female, DOB - [birth year], AOZ:[id number]  Admit date - 09/12/2016   Admitting Physician Levie Heritage, DO  Outpatient Primary MD for the patient is Thane Edu, MD  LOS - 0  Outpatient Specialists:    Chief Complaint  Patient presents with  . Hypertension       Brief Narrative  71 y.o. female with medical history significant of CAD, systolic CHF with EF 35-40%, diabetes mellitus - diet controlled, end-stage renal disease on hemodialysis Monday/Wednesday/Friday, peripheral arterial disease-status post left below knee amputation and status post left hip arthroplasty remotely and left femur fracture requiring rehabilitation in the facility. Patient went to hemodialysis today and suddenly developed sudden onset of dizziness, dyspnea or chest pain. She describes chest pain as localized under her left breast and being sharp, stabbing and intermittent. 3 minutes after the initiation of dialysis she was found to be hypertensive with systolic blood pressure greater than 200 mmHg in addition to the above listed symptoms and transferred to the ED via EMS  Patient denied any flulike symptoms, has no palpitations, myalgia, arthralgia, abdominal pain, nausea vomiting   ED Course: On arrival to the emergency department patient's blood pressure was elevated at 182/105 mmHg, she was tachypneic with respirations of 22, temperature was normal and oxygen saturation was greater than 92% on room air. Blood work demonstrated hemoglobin 11.8 hyperkalemia with potassium 5.4 sodium 134,  creatinine 4.70 and BUN 25. First sets of troponin was 0.30 and the second 73 hours was 0.25. EKG demonstrated sinus rhythm with some nonspecific ST changes but there is a large S1, Q 3 and T3 flattening present in this EKG Chest x-ray showed cardiomegaly and vascular congestion, hilar lymphadenopathy   Subjective:    Memory Dance today has no further chest pain. Pt  noted that the pain was going on for about 1 week. Usually lasting about 10 sec at a time.    No headache, , No abdominal pain - No Nausea, No new weakness tingling or numbness, No Cough - SOB.    Assessment  & Plan :    Principal Problem:   Chest pain Active Problems:   ESRD (end stage renal disease) on dialysis (HCC)   GERD (gastroesophageal reflux disease)   CAD, multiple vessel   CHF (congestive heart failure) (HCC)   Hypertension   Hypothyroid   Anemia   Hypertensive urgency   Dyspnea  Chest pain with elevated troponins and  EKG changes Pt was unable to to have CTA chest due to iv infiltration.  Pox normal, pt not tachycardic.pt declines CTA chest at present time Please ask in AM if would reconsider NPO after MN Nuclear stress test   r/o ischemic heart disease,  Last stress test    Dyspnea - most likely d/t acute exacerbation of chronic systolic CHF Patient had  Hemodialysis  4/21     Hypertensive urgency and patient was previously known history of hypertension - patient reports her blood pressure is been well controlled prior to this episode Could be associated with chronic pain. Patient was taking hydrocodone that this was changed to oxycodone by PCP, but unfortunately it is not helping with pain In the ED after she received the dose of hydrocodone her blood pressure significantly improved to 167/74 --> 124/74 mmHg Will continue home meds and monitor BP, adjust doses if needed  ESRD - on HD,   M, W, F, today is her day of HD and renal service notified. HD as per renal Check cmp in am  Hypothyroidism    Continue Synthroid  Anemia - most likely associated with chronic kidney disease Check cbc in am  Depression - continue Lexapro and Amitriptyline  Hyperlippidemia - continue statin therapy  DVT prophylaxis: Heparin Code Status: DNR Family Communication: none Disposition Plan:  unable to obtain CTA chest,  Pt declines for now.  Consults called: renal service by EDP Admission status: observation  Lab Results  Component Value Date   PLT 149 (L) 09/12/2016    Antibiotics  :    Anti-infectives    None        Objective:   Vitals:   09/12/16 1327 09/12/16 1602 09/12/16 2104 09/13/16 0546  BP: (!) 177/92 (!) 163/90 (!) 164/83 (!) 154/77  Pulse: 73 75 76 64  Resp: (!) Temp:  97 F (36.1 C) 97.6 F (36.4 C) 97.5 F (36.4 C)  TempSrc:  Oral Oral Oral  SpO2: 100% 100% 96% 94%  Weight:    79.1 kg (174 lb 4.8 oz)    Wt Readings from Last 3 Encounters:  09/13/16 79.1 kg (174 lb 4.8 oz)  08/07/16 81.2 kg (179 lb)  07/03/16 81.6 kg (179 lb 14.4 oz)    No intake or output data in the 24 hours ending 09/13/16 0647   Physical Exam  Awake Alert, Oriented X 3, No new F.N deficits, Normal affect [place].AT,PERRAL Supple Neck,No JVD, No cervical lymphadenopathy appriciated.  Symmetrical Chest wall movement, Good air movement bilaterally, CTAB RRR,No Gallops,Rubs or new Murmurs, No Parasternal Heave +ve B.Sounds, Abd Soft, No tenderness, No organomegaly appriciated, No rebound - guarding or rigidity. No Cyanosis, Clubbing or edema, No new Rash or bruise   L BKA    Data Review:    CBC  Recent Labs Lab  09/12/16 0740  WBC 7.6  HGB 11.8*  HCT 38.1  PLT 149*  MCV 97.2  MCH 30.1  MCHC 31.0  RDW 20.7*  LYMPHSABS 1.2  MONOABS 0.5  EOSABS 0.2  BASOSABS 0.0    Chemistries   Recent Labs Lab 09/12/16 0826  NA 134*  K 5.4*  CL 90*  CO2 24  GLUCOSE 71  BUN 25*  CREATININE 4.70*  CALCIUM 8.3*  AST 90*  ALT 172*  ALKPHOS 116  BILITOT 2.2*    ------------------------------------------------------------------------------------------------------------------ No results for input(s): CHOL, HDL, LDLCALC, TRIG, CHOLHDL, LDLDIRECT in the last 72 hours.  Lab Results  Component Value Date   HGBA1C (H) 02/12/2010    6.3 (NOTE)                                                                       According to the ADA Clinical Practice Recommendations for 2011, when HbA1c is used as a screening test:   >=6.5%   Diagnostic of Diabetes Mellitus           (if abnormal result  is confirmed)  5.7-6.4%   Increased risk of developing Diabetes Mellitus  References:Diagnosis and Classification of Diabetes Mellitus,Diabetes Care,2011,34(Suppl 1):S62-S69 and Standards of Medical Care in         Diabetes - 2011,Diabetes Care,2011,34  (Suppl 1):S11-S61.   ------------------------------------------------------------------------------------------------------------------ No results for input(s): TSH, T4TOTAL, T3FREE, THYROIDAB in the last 72 hours.  Invalid input(s): FREET3 ------------------------------------------------------------------------------------------------------------------ No results for input(s): VITAMINB12, FOLATE, FERRITIN, TIBC, IRON, RETICCTPCT in the last 72 hours.  Coagulation profile No results for input(s): INR, PROTIME in the last 168 hours.  No results for input(s): DDIMER in the last 72 hours.  Cardiac Enzymes  Recent Labs Lab 09/12/16 2026  TROPONINI 0.23*   ------------------------------------------------------------------------------------------------------------------ No results found for: BNP  Inpatient Medications  Scheduled Meds: . calcium carbonate  3 tablet Oral TID  . docusate sodium  100 mg Oral BID  . [START ON 09/15/2016] doxercalciferol  2 mcg Intravenous Q M,W,F-HD  . escitalopram  30 mg Oral Daily  . heparin  5,000 Units Subcutaneous Q8H  . irbesartan  150 mg Oral Daily  . levothyroxine  100 mcg  Oral QAC breakfast  . metoprolol succinate  25 mg Oral BID  . nortriptyline  25 mg Oral QHS  . pravastatin  20 mg Oral q1800  . senna-docusate  2 tablet Oral BID   Continuous Infusions: PRN Meds:.acetaminophen, ALPRAZolam, aspirin, clonazePAM, hydrOXYzine, loperamide, ondansetron (ZOFRAN) IV  Micro Results No results found for this or any previous visit (from the past 240 hour(s)).  Radiology Reports Dg Chest 2 View  Result Date: 09/12/2016 CLINICAL DATA:  Dizziness and weakness EXAM: CHEST  2 VIEW COMPARISON:  08/09/2012 FINDINGS: Low volume chest with vascular congestion and prominent hila. Cardiomegaly. No effusion or pneumothorax. No air bronchogram. IMPRESSION: 1. Cardiomegaly and vascular congestion. 2. Fullness of the hila, likely accentuated by low volumes and #1. Hilar adenopathy is a possibility and two-view chest x-ray is recommended after convalescence. Electronically Signed   By: Marnee Spring M.D.   On: 09/12/2016 08:07    Time Spent in minutes  30   Pearson Grippe M.D on 09/13/2016 at 6:47 AM  Between 7am to 7pm -  Pager - [phone]  After 7pm go to [email or web address] - password [email or web address]  [facility] -  Office  [phone]

## 2016-09-13 NOTE — Progress Notes (Signed)
Dialysis treatment completed.  1500 mL ultrafiltrated and net fluid removal 1000 mL.    Patient status unchanged. Lung sounds diminished to ausculation in all fields. Generalized edema. Cardiac: NSR.  Disconnected lines and removed needles.  Pressure held for 10 minutes and band aid/gauze dressing applied.  Report given to bedside RN, Carly.

## 2016-09-13 NOTE — Progress Notes (Signed)
Patient IV was flushed three times with a hand flush and then again with the power injector.  The test was started and the patient yelled out that the IV was extremely painful, injection was stopped at 20 CC no evidence of extravasation.  IV removed and patient's nurse notified

## 2016-09-13 NOTE — Progress Notes (Signed)
Spoke with Dr. Selena Batten regarding pt not being a PICC candidate due to dialysis, unless nephrology clears.  Was able to obtain a 20 G 1.88" catheter in LAC for CT scan.  New order to d/c PICC order.

## 2016-09-13 NOTE — Procedures (Signed)
  I was present at this dialysis session, have reviewed the session itself and made  appropriate changes Vinson Moselle MD [facility] pager [phone]   09/13/2016, 2:34 PM

## 2016-09-14 ENCOUNTER — Observation Stay (HOSPITAL_BASED_OUTPATIENT_CLINIC_OR_DEPARTMENT_OTHER): Payer: Medicare (Managed Care)

## 2016-09-14 ENCOUNTER — Observation Stay (HOSPITAL_COMMUNITY): Payer: Medicare (Managed Care)

## 2016-09-14 DIAGNOSIS — N2581 Secondary hyperparathyroidism of renal origin: Secondary | ICD-10-CM | POA: Diagnosis present

## 2016-09-14 DIAGNOSIS — K219 Gastro-esophageal reflux disease without esophagitis: Secondary | ICD-10-CM | POA: Diagnosis present

## 2016-09-14 DIAGNOSIS — N186 End stage renal disease: Secondary | ICD-10-CM | POA: Diagnosis not present

## 2016-09-14 DIAGNOSIS — D500 Iron deficiency anemia secondary to blood loss (chronic): Secondary | ICD-10-CM | POA: Diagnosis not present

## 2016-09-14 DIAGNOSIS — E1122 Type 2 diabetes mellitus with diabetic chronic kidney disease: Secondary | ICD-10-CM | POA: Diagnosis present

## 2016-09-14 DIAGNOSIS — I5021 Acute systolic (congestive) heart failure: Secondary | ICD-10-CM | POA: Diagnosis not present

## 2016-09-14 DIAGNOSIS — I272 Pulmonary hypertension, unspecified: Secondary | ICD-10-CM | POA: Diagnosis present

## 2016-09-14 DIAGNOSIS — E875 Hyperkalemia: Secondary | ICD-10-CM | POA: Diagnosis present

## 2016-09-14 DIAGNOSIS — I160 Hypertensive urgency: Secondary | ICD-10-CM | POA: Diagnosis not present

## 2016-09-14 DIAGNOSIS — D631 Anemia in chronic kidney disease: Secondary | ICD-10-CM | POA: Diagnosis present

## 2016-09-14 DIAGNOSIS — I10 Essential (primary) hypertension: Secondary | ICD-10-CM | POA: Diagnosis not present

## 2016-09-14 DIAGNOSIS — Y831 Surgical operation with implant of artificial internal device as the cause of abnormal reaction of the patient, or of later complication, without mention of misadventure at the time of the procedure: Secondary | ICD-10-CM | POA: Diagnosis present

## 2016-09-14 DIAGNOSIS — I200 Unstable angina: Secondary | ICD-10-CM

## 2016-09-14 DIAGNOSIS — I132 Hypertensive heart and chronic kidney disease with heart failure and with stage 5 chronic kidney disease, or end stage renal disease: Secondary | ICD-10-CM | POA: Diagnosis present

## 2016-09-14 DIAGNOSIS — T82855A Stenosis of coronary artery stent, initial encounter: Secondary | ICD-10-CM | POA: Diagnosis present

## 2016-09-14 DIAGNOSIS — J45909 Unspecified asthma, uncomplicated: Secondary | ICD-10-CM | POA: Diagnosis present

## 2016-09-14 DIAGNOSIS — E1151 Type 2 diabetes mellitus with diabetic peripheral angiopathy without gangrene: Secondary | ICD-10-CM | POA: Diagnosis present

## 2016-09-14 DIAGNOSIS — Z992 Dependence on renal dialysis: Secondary | ICD-10-CM | POA: Diagnosis not present

## 2016-09-14 DIAGNOSIS — Z89512 Acquired absence of left leg below knee: Secondary | ICD-10-CM | POA: Diagnosis not present

## 2016-09-14 DIAGNOSIS — I2510 Atherosclerotic heart disease of native coronary artery without angina pectoris: Secondary | ICD-10-CM | POA: Diagnosis not present

## 2016-09-14 DIAGNOSIS — R079 Chest pain, unspecified: Secondary | ICD-10-CM

## 2016-09-14 DIAGNOSIS — I214 Non-ST elevation (NSTEMI) myocardial infarction: Secondary | ICD-10-CM | POA: Diagnosis present

## 2016-09-14 DIAGNOSIS — E11319 Type 2 diabetes mellitus with unspecified diabetic retinopathy without macular edema: Secondary | ICD-10-CM | POA: Diagnosis present

## 2016-09-14 DIAGNOSIS — E785 Hyperlipidemia, unspecified: Secondary | ICD-10-CM | POA: Diagnosis present

## 2016-09-14 DIAGNOSIS — I252 Old myocardial infarction: Secondary | ICD-10-CM | POA: Diagnosis not present

## 2016-09-14 DIAGNOSIS — I25110 Atherosclerotic heart disease of native coronary artery with unstable angina pectoris: Secondary | ICD-10-CM | POA: Diagnosis present

## 2016-09-14 DIAGNOSIS — E039 Hypothyroidism, unspecified: Secondary | ICD-10-CM | POA: Diagnosis present

## 2016-09-14 DIAGNOSIS — F419 Anxiety disorder, unspecified: Secondary | ICD-10-CM | POA: Diagnosis present

## 2016-09-14 DIAGNOSIS — I5023 Acute on chronic systolic (congestive) heart failure: Secondary | ICD-10-CM | POA: Diagnosis present

## 2016-09-14 DIAGNOSIS — F329 Major depressive disorder, single episode, unspecified: Secondary | ICD-10-CM | POA: Diagnosis present

## 2016-09-14 DIAGNOSIS — Z96642 Presence of left artificial hip joint: Secondary | ICD-10-CM | POA: Diagnosis present

## 2016-09-14 LAB — CBC
HCT: 40.8 % (ref 36.0–46.0)
Hemoglobin: 12.8 g/dL (ref 12.0–15.0)
MCH: 30.5 pg (ref 26.0–34.0)
MCHC: 31.4 g/dL (ref 30.0–36.0)
MCV: 97.1 fL (ref 78.0–100.0)
PLATELETS: 169 10*3/uL (ref 150–400)
RBC: 4.2 MIL/uL (ref 3.87–5.11)
RDW: 21.4 % — ABNORMAL HIGH (ref 11.5–15.5)
WBC: 5.9 10*3/uL (ref 4.0–10.5)

## 2016-09-14 LAB — COMPREHENSIVE METABOLIC PANEL
ALT: 143 U/L — AB (ref 14–54)
AST: 131 U/L — AB (ref 15–41)
Albumin: 3 g/dL — ABNORMAL LOW (ref 3.5–5.0)
Alkaline Phosphatase: 154 U/L — ABNORMAL HIGH (ref 38–126)
Anion gap: 17 — ABNORMAL HIGH (ref 5–15)
BUN: 25 mg/dL — ABNORMAL HIGH (ref 6–20)
CHLORIDE: 91 mmol/L — AB (ref 101–111)
CO2: 23 mmol/L (ref 22–32)
CREATININE: 4.58 mg/dL — AB (ref 0.44–1.00)
Calcium: 8.1 mg/dL — ABNORMAL LOW (ref 8.9–10.3)
GFR calc Af Amer: 10 mL/min — ABNORMAL LOW (ref >60)
GFR calc non Af Amer: 9 mL/min — ABNORMAL LOW (ref >60)
GLUCOSE: 80 mg/dL (ref 65–99)
Potassium: 3.7 mmol/L (ref 3.5–5.1)
SODIUM: 131 mmol/L — AB (ref 135–145)
Total Bilirubin: 1.8 mg/dL — ABNORMAL HIGH (ref 0.3–1.2)
Total Protein: 5.7 g/dL — ABNORMAL LOW (ref 6.5–8.1)

## 2016-09-14 LAB — ECHOCARDIOGRAM COMPLETE: Weight: 2835.2 oz

## 2016-09-14 MED ORDER — REGADENOSON 0.4 MG/5ML IV SOLN
INTRAVENOUS | Status: AC
  Filled 2016-09-14: qty 5

## 2016-09-14 MED ORDER — SODIUM CHLORIDE 0.9 % IV SOLN: 250.0000 mL | INTRAVENOUS | Status: DC | PRN

## 2016-09-14 MED ORDER — LANTHANUM CARBONATE 500 MG PO CHEW
1000.0000 mg | CHEWABLE_TABLET | Freq: Three times a day (TID) | ORAL | Status: DC
  Administered 2016-09-14 – 2016-09-16 (×2): 1000 mg via ORAL
  Administered 2016-09-16: 500 mg via ORAL
  Filled 2016-09-14 (×4): qty 2

## 2016-09-14 MED ORDER — RENA-VITE PO TABS
1.0000 | ORAL_TABLET | Freq: Every day | ORAL | Status: DC
  Administered 2016-09-14 – 2016-09-16 (×3): 1 via ORAL
  Filled 2016-09-14 (×3): qty 1

## 2016-09-14 MED ORDER — ASPIRIN 81 MG PO CHEW
81.0000 mg | CHEWABLE_TABLET | ORAL | Status: AC
  Administered 2016-09-15: 81 mg via ORAL
  Filled 2016-09-14: qty 1

## 2016-09-14 MED ORDER — SODIUM CHLORIDE 0.9% FLUSH
3.0000 mL | Freq: Two times a day (BID) | INTRAVENOUS | Status: DC
  Administered 2016-09-14: 3 mL via INTRAVENOUS

## 2016-09-14 MED ORDER — REGADENOSON 0.4 MG/5ML IV SOLN
0.4000 mg | Freq: Once | INTRAVENOUS | Status: AC
  Administered 2016-09-14: 0.4 mg via INTRAVENOUS
  Filled 2016-09-14: qty 5

## 2016-09-14 MED ORDER — SODIUM CHLORIDE 0.9% FLUSH: 3.0000 mL | INTRAVENOUS | Status: DC | PRN

## 2016-09-14 MED ORDER — SODIUM CHLORIDE 0.9 % IV SOLN
Freq: Once | INTRAVENOUS | Status: AC
  Administered 2016-09-15: 06:00:00 via INTRAVENOUS

## 2016-09-14 MED ORDER — CLOTRIMAZOLE 1 % EX CREA
TOPICAL_CREAM | Freq: Two times a day (BID) | CUTANEOUS | Status: DC
  Administered 2016-09-14 – 2016-09-15 (×3): via TOPICAL
  Administered 2016-09-16: 1 via TOPICAL
  Administered 2016-09-16 – 2016-09-17 (×2): via TOPICAL
  Filled 2016-09-14: qty 15

## 2016-09-14 MED ORDER — TECHNETIUM TC 99M TETROFOSMIN IV KIT
10.0000 | PACK | Freq: Once | INTRAVENOUS | Status: AC | PRN
  Administered 2016-09-14: 10 via INTRAVENOUS

## 2016-09-14 NOTE — Consult Note (Signed)
[facility] Renal Consultation Note  Indication for Consultation:  Management of ESRD/hemodialysis; anemia, hypertension/volume and secondary hyperparathyroidism  IEP:PIRJJ [name] is a 71 Y/O female with ESRD on hemodialysis MWF ([place])  PMH of CAD sp stenting RCA 8841, systolic HF with EF 66-06%, DMT2, PAD, L BKA, recent L fx femur and L hip arthroplasty.  Fridy 09/12/16 , presented to HD unit C/O chest pressure /SOB. Patient reports 1 week ho  chest pressure/ and exertional SOB. She was sent to ED for evaluation. She has been admitted for chest pain as observation patient. Admit CXR with vascular congestion/cardiomegaly. Had HD yesterday D/T scheduling with one liter uf below edw by 1kg.  CE Trop cycled 0.2,,Card consulted  And for card cath in Am . 2d echo just finished .       Past Medical History:  Diagnosis Date  . Anxiety   . Asthma   . CAD (coronary artery disease)    s/p MI x 2, s/p stent x 4 (05/2005 with DES placed at that time)  . Carpal tunnel syndrome, bilateral   . Chronic systolic congestive heart failure ([place])    EF 35-40% per 2D echo (03/2011)  . Constipation   . Diabetes mellitus type 2, controlled, with complications ([place])    diet controlled  . Diabetic retinopathy   . ESRD (end stage renal disease) on dialysis [facility]) HD since 07/2008   [facility]; Mon, [place], Friday  . Fibromyalgia   . Hemorrhoids   . Hyperlipidemia   . Hypertension   . Hypothyroid   . Major depression   . Migraines   . Morphea   . Normocytic anemia    BL Hgb 10-12  //  Chronic, likely multifactorial AOCD and possible iron deficiency component with transferrin sat < 20 historically.  . Osteoporosis   . PAD (peripheral artery disease) (H[facility])    s/p L BKA  . Peripheral neuropathy   . PONV (postoperative nausea and vomiting)   . Pulmonary hypertension (H[facility])    PA Peak pressure 57 mmHg per 2D echo (03/2011)  . Refusal of blood transfusions as patient is Jehovah's Witness    . Secondary hyperparathyroidism ([place])   . Vitamin D deficiency     Past Surgical History:  Procedure Laterality Date  . AV FISTULA PLACEMENT  ~ 2011   right upper arm  . BYPASS GRAFT  2006  . CARDIAC CATHETERIZATION  2006, 2007   pt had 4 stents placed  . CATARACT EXTRACTION, BILATERAL Bilateral 2000  . CHOLECYSTECTOMY  1987  . HARDWARE REMOVAL Left 07/03/2016   Procedure: LEFT LEG HARDWARE REMOVAL, REMOVAL OF PINS;  Surgeon: Meredith Pel, MD;  Location: [facility];  Service: Orthopedics;  Laterality: Left;  . HEMIARTHROPLASTY HIP Left 10/2009  . JOINT REPLACEMENT  2007   Partial placement Left Hip  . LEG AMPUTATION BELOW KNEE Left 10/2008   left  . ORIF FEMUR FRACTURE Left 06/09/2016   Procedure: CLOSED REDUCTION,  CANNULATED SCREWS PINNING;  Surgeon: Meredith Pel, MD;  Location: [facility];  Service: Orthopedics;  Laterality: Left;  . PERIPHERAL ARTERIAL STENT GRAFT Bilateral    bilaterally "(left ~ 2010; right ~ 2011"  . VAGINAL HYSTERECTOMY  1994   with BSO      Family History  Problem Relation Age of Onset  . Alcohol abuse Father   . Multiple myeloma Mother   . Diabetes type II Mother   . Hypertension Mother   . Cancer Mother   .  Diabetes Mother   . Appendicitis Brother       reports that she quit smoking about 27 years ago. Her smoking use included Cigarettes. She has a 60.00 pack-year smoking history. She has never used smokeless tobacco. She reports that she does not drink alcohol or use drugs.   Allergies  Allergen Reactions  . Morphine And Related Other (See Comments)    Pt fell into deep sleep and could not be woken up had to be resuscitated says she did not overdose, unsure whether throat swelled or SOB pt does not remember  . Other Anaphylaxis    PEACHES  . Penicillins Swelling    Tongue swelling.   Marland Kitchen Pumpkin Seed Oil-Saw Palmetto-Zinc [Propalmex] Anaphylaxis  . Shrimp [Shellfish Allergy] Anaphylaxis    Prior to Admission medications   Medication Sig  Start Date End Date Taking? Authorizing Provider  acetaminophen (TYLENOL) 325 MG tablet Take 650 mg by mouth every 6 (six) hours as needed for mild pain.   Yes Historical Provider, MD  ALPRAZolam (XANAX) 0.25 MG tablet Take 0.25 mg by mouth 3 (three) times daily as needed for anxiety.    Yes Historical Provider, MD  aspirin 81 MG chewable tablet Chew 81 mg by mouth every 6 (six) hours as needed for mild pain.    Yes Historical Provider, MD  calcium carbonate (TUMS - DOSED IN MG ELEMENTAL CALCIUM) 500 MG chewable tablet Chew 3 tablets by mouth 3 (three) times daily.    Yes Historical Provider, MD  clonazePAM (KLONOPIN) 1 MG tablet Take 1 mg by mouth 2 (two) times daily as needed for anxiety.    Yes Historical Provider, MD  docusate sodium (COLACE) 100 MG capsule Take 100 mg by mouth 2 (two) times daily.   Yes Historical Provider, MD  escitalopram (LEXAPRO) 20 MG tablet Take 30 mg by mouth daily.    Yes Historical Provider, MD  hydrOXYzine (ATARAX/VISTARIL) 25 MG tablet Take 25 mg by mouth every 6 (six) hours as needed for itching.    Yes Historical Provider, MD  lanthanum (FOSRENOL) 500 MG chewable tablet Chew 1 tablet (500 mg total) by mouth 3 (three) times daily with meals. 06/12/16  Yes Asencion Partridge, MD  levothyroxine (SYNTHROID, LEVOTHROID) 100 MCG tablet Take 100 mcg by mouth daily.    Yes Historical Provider, MD  loperamide (IMODIUM) 2 MG capsule Take 2 mg by mouth 4 (four) times daily as needed for diarrhea or loose stools.    Yes Historical Provider, MD  lovastatin (MEVACOR) 20 MG tablet Take 20 mg by mouth at bedtime.    Yes Historical Provider, MD  metoprolol succinate (TOPROL-XL) 25 MG 24 hr tablet Take 25 mg by mouth 2 (two) times daily.    Yes Historical Provider, MD  nortriptyline (PAMELOR) 25 MG capsule Take 25 mg by mouth at bedtime.   Yes Historical Provider, MD  oxycodone (OXY-IR) 5 MG capsule Take 5 mg by mouth every 12 (twelve) hours as needed for pain.   Yes Historical Provider, MD   promethazine (PHENERGAN) 25 MG tablet Take 25 mg by mouth every 8 (eight) hours as needed for nausea or vomiting.    Yes Historical Provider, MD  senna-docusate (SENOKOT-S) 8.6-50 MG tablet Take 2 tablets by mouth 2 (two) times daily.   Yes Historical Provider, MD  valsartan (DIOVAN) 80 MG tablet Take 80 mg by mouth daily.   Yes Historical Provider, MD     Anti-infectives    None      Results for  orders placed or performed during the hospital encounter of 09/12/16 (from the past 48 hour(s))  Troponin I-serum (0, 3, 6 hours)     Status: Abnormal   Collection Time: 09/12/16  8:26 PM  Result Value Ref Range   Troponin I 0.23 (HH) <0.03 ng/mL    Comment: CRITICAL RESULT CALLED TO, READ BACK BY AND VERIFIED WITH: EJINDU,T RN 09/13/2016 0034 JORDANS   Renal function panel     Status: Abnormal   Collection Time: 09/13/16 12:10 PM  Result Value Ref Range   Sodium 132 (L) 135 - 145 mmol/L   Potassium 3.5 3.5 - 5.1 mmol/L    Comment: DELTA CHECK NOTED   Chloride 91 (L) 101 - 111 mmol/L   CO2 23 22 - 32 mmol/L   Glucose, Bld 87 65 - 99 mg/dL   BUN 37 (H) 6 - 20 mg/dL   Creatinine, Ser 5.76 (H) 0.44 - 1.00 mg/dL   Calcium 8.1 (L) 8.9 - 10.3 mg/dL   Phosphorus 6.5 (H) 2.5 - 4.6 mg/dL   Albumin 2.9 (L) 3.5 - 5.0 g/dL   GFR calc non Af Amer 7 (L) >60 mL/min   GFR calc Af Amer 8 (L) >60 mL/min    Comment: (NOTE) The eGFR has been calculated using the CKD EPI equation. This calculation has not been validated in all clinical situations. eGFR's persistently <60 mL/min signify possible Chronic Kidney Disease.    Anion gap 18 (H) 5 - 15  CBC     Status: Abnormal   Collection Time: 09/13/16 12:10 PM  Result Value Ref Range   WBC 5.1 4.0 - 10.5 K/uL   RBC 3.85 (L) 3.87 - 5.11 MIL/uL   Hemoglobin 11.5 (L) 12.0 - 15.0 g/dL   HCT 36.9 36.0 - 46.0 %   MCV 95.8 78.0 - 100.0 fL   MCH 29.9 26.0 - 34.0 pg   MCHC 31.2 30.0 - 36.0 g/dL   RDW 20.7 (H) 11.5 - 15.5 %   Platelets 131 (L) 150 - 400  K/uL    ROS: as in HPI   Physical Exam: Vitals:   09/14/16 0443 09/14/16 0916  BP: (!) 132/51 136/72  Pulse: (!) 57   Resp: 18   Temp: 97.6 F (36.4 C)      General: Alert [place] Fem. NAD  OX3  HEENT: [place] / eomi, MMM Neck: supple , no jvd  Heart: RRR noo r,m,g Lungs: CTA  bilat , Unlabored breathing  Abdomen: Obese , BS pos , soft , NT ND Extremities: Left BKA/ Right lower extrem  Skin: no overt rash/ R lower leg bruising  Nontender with  Trace pedal edema  Neuro: Alert OX3 , moves all extrem , needed help to sit upright on bed , No overt acute focal deficits for her  Dialysis Access:  RUA AVF pos bruit   Hemodialysis Orders: [facility] MWF with RUA AVF 3 hours 45 minute 180 NRe 400/Auto 1.5 79 kg 2.0 K/2.25 Ca Linear Na Treatment medications: Heparin 8,500 units IV TIW Hectoral 2 mcg IV TIW Mircera 75 mcg IV q 2 weeks (Last dose 09/01/16)    Assessment/Plan 1. ESRD -  HD MWF >HD tomor  K 3.5  Reck labs pre  Use 3.0 k bath  To start [place] around card cath .  2. Unstable Angina  With Known CAD- Card eval in progress with Card cath planned for tomor. 3. Hypertension/volume  -  bp elavated on admit   172/93/ today BP stable  136/72 / below edw by 1 kg yest  With `1liter  uf on dialysi/ on metopr 44m bid   4. Anemia  Of ESRD - hgb 11.5 ( no esa needs with hgb>11) last Mircera 767m  09/01/16 / monitor hgb  5. Metabolic bone disease -  On Tums and Fosrenol as binder/ Hec inv On HD / corec Ca= 9.0 /phos 6.5  6. DM type 2 - per admit   DaErnest HaberPA-C [id number]/22/2018, 12:52 PM   Pt seen, examined and agree w A/P as above.  RoKelly SplinterD CaNewell Rubbermaidager [phone] 09/14/2016, 3:53 PM

## 2016-09-14 NOTE — Progress Notes (Signed)
Stress test cancelled per Dr. Adella Hare. Pt to be scheduled for cath tomorrow. RN on [facility] notified. Pt agrees with plan. Awake and alert. In no distress.

## 2016-09-14 NOTE — Progress Notes (Signed)
  Echocardiogram 2D Echocardiogram has been performed.  [name] [name] 09/14/2016, 1:45 PM

## 2016-09-14 NOTE — Progress Notes (Signed)
Patient was assist to bedside commode with me and Cordelia N.T. With gait belt on and walker in use. Bedside commode close to bed. Patient right leg got weak and we assist her to the floor and was sat on her buttocks. V.S.S. No complaints voiced. Skin intact. Brea R.N. Aware Dr. Antionette Char on call and made aware. No family listed on emerg. Contact list.

## 2016-09-14 NOTE — Progress Notes (Signed)
PROGRESS NOTE                                                                                                                                                                                                             Patient Demographics:    [name], is a 71 y.o. female, DOB - 05/03/1946, ONG:[id number]  Admit date - 09/12/2016   Admitting Physician Levie Heritage, DO  Outpatient Primary MD for the patient is Thane Edu, MD  LOS - 0  Outpatient Specialists:Nephrology  Chief Complaint  Patient presents with  . Hypertension       Brief Narrative   71 year old female with history of moderate to severe systolic CHF (EF 44-01%), coronary artery disease with RCA stenting in 2007, diet-controlled diabetes mellitus, ESRD on hemodialysis (M, W, F), peripheral arterial disease status post left BKA, recent left femur fracture and left hip arthroplasty, resident of SNF who developed sudden onset of dizziness associated with dyspnea and chest pain during hemodialysis. She was also found to be in hypertensive crisis with systolic blood pressure >200 and was sent to the ED via EMS.  In the ED she was hypotensive with BP of 182/105 mmHg, tachypneic, afebrile. Blood work showed K of 5.4, creatinine 4.7 with mildly elevated troponin (0.3, 0.25) she was noted to have EKG without acute changes. Patient admitted to hospitalist service for hypertensive crisis and chest pain symptoms. She was scheduled for stress test but given her progressive chest pain symptoms for past 3 weeks worsened with exertion was concerning for unstable angina and cardiology decided with cardiac cath.   Subjective:    Patient continues to have substernal chest pressure worsened with exertion.   Assessment  & Plan :   Principal problem Unstable angina Hemodynamically stable but still having chest pain on exertion. Cardiology plan on cardiac cath  tomorrow. Nothing by mouth after midnight. Low 2-D echo results. Continue aspirin, statin and beta blocker.  Acute exacerbation of chronic systolic CHF. Managed with hemodialysis. Continue aspirin, beta blocker and ARB.  Hypertensive urgency Likely contributed by unstable angina involving overload. Improved after dialysis.   ESRD on HD Getting scheduled dialysis here. Nephrology following.  Anemia of chronic kidney disease Stable.  Chronic depression Continue Lexapro and amitriptyline.  Hyperlipidemia Continue statin    Code Status : DO NOT RESUSCITATE  Family Communication  :  None at bedside  Disposition Plan  : Pending cardiac workup  Barriers For Discharge : Active symptoms. Cardiac cath on 4/23  Consults  :   Cardiology Renal  Procedures  :  Hemodialysis  DVT Prophylaxis  :  Heparin  Lab Results  Component Value Date   PLT 131 (L) 09/13/2016    Antibiotics  :    Anti-infectives    None        Objective:   Vitals:   09/14/16 0000 09/14/16 0443 09/14/16 0916 09/14/16 1341  BP: (!) 124/59 (!) 132/51 136/72 125/66  Pulse: 63 (!) 57  65  Resp: Temp: 97.7 F (36.5 C) 97.6 F (36.4 C)  97.5 F (36.4 C)  TempSrc: Axillary Axillary  Oral  SpO2: 94% 92%  91%  Weight:  80.4 kg (177 lb 3.2 oz)      Wt Readings from Last 3 Encounters:  09/14/16 80.4 kg (177 lb 3.2 oz)  08/07/16 81.2 kg (179 lb)  07/03/16 81.6 kg (179 lb 14.4 oz)     Intake/Output Summary (Last 24 hours) at 09/14/16 1411 Last data filed at 09/14/16 0800  Gross per 24 hour  Intake              360 ml  Output             1000 ml  Net             -640 ml     Physical Exam  Gen: not in distress HEENT: moist mucosa, supple neck Chest: clear b/l, no added sounds CVS: N S1&S2, no murmurs, rubs or gallop GI: soft, NT, ND,  Musculoskeletal: warm, no edema, left BKA     Data Review:    CBC  Recent Labs Lab 09/12/16 0740 09/13/16 1210  WBC 7.6 5.1  HGB  11.8* 11.5*  HCT 38.1 36.9  PLT 149* 131*  MCV 97.2 95.8  MCH 30.1 29.9  MCHC 31.0 31.2  RDW 20.7* 20.7*  LYMPHSABS 1.2  --   MONOABS 0.5  --   EOSABS 0.2  --   BASOSABS 0.0  --     Chemistries   Recent Labs Lab 09/12/16 0826 09/13/16 1210  NA 134* 132*  K 5.4* 3.5  CL 90* 91*  CO2 24 23  GLUCOSE 71 87  BUN 25* 37*  CREATININE 4.70* 5.76*  CALCIUM 8.3* 8.1*  AST 90*  --   ALT 172*  --   ALKPHOS 116  --   BILITOT 2.2*  --    ------------------------------------------------------------------------------------------------------------------ No results for input(s): CHOL, HDL, LDLCALC, TRIG, CHOLHDL, LDLDIRECT in the last 72 hours.  Lab Results  Component Value Date   HGBA1C (H) 02/12/2010    6.3 (NOTE)                                                                       According to the ADA Clinical Practice Recommendations for 2011, when HbA1c is used as a screening test:   >=6.5%   Diagnostic of Diabetes Mellitus           (if abnormal result  is confirmed)  5.7-6.4%   Increased risk of developing Diabetes Mellitus  References:Diagnosis and Classification of  Diabetes Mellitus,Diabetes Care,2011,34(Suppl 1):S62-S69 and Standards of Medical Care in         Diabetes - 2011,Diabetes Care,2011,34  (Suppl 1):S11-S61.   ------------------------------------------------------------------------------------------------------------------ No results for input(s): TSH, T4TOTAL, T3FREE, THYROIDAB in the last 72 hours.  Invalid input(s): FREET3 ------------------------------------------------------------------------------------------------------------------ No results for input(s): VITAMINB12, FOLATE, FERRITIN, TIBC, IRON, RETICCTPCT in the last 72 hours.  Coagulation profile No results for input(s): INR, PROTIME in the last 168 hours.  No results for input(s): DDIMER in the last 72 hours.  Cardiac Enzymes  Recent Labs Lab 09/12/16 2026  TROPONINI 0.23*    ------------------------------------------------------------------------------------------------------------------ No results found for: BNP  Inpatient Medications  Scheduled Meds: . calcium carbonate  3 tablet Oral TID  . docusate sodium  100 mg Oral BID  . [START ON 09/15/2016] doxercalciferol  2 mcg Intravenous Q M,W,F-HD  . escitalopram  30 mg Oral Daily  . heparin  5,000 Units Subcutaneous Q8H  . irbesartan  150 mg Oral Daily  . lanthanum  1,000 mg Oral TID WC  . levothyroxine  100 mcg Oral QAC breakfast  . metoprolol succinate  25 mg Oral BID  . multivitamin  1 tablet Oral QHS  . nortriptyline  25 mg Oral QHS  . pravastatin  20 mg Oral q1800  . senna-docusate  2 tablet Oral BID   Continuous Infusions: PRN Meds:.acetaminophen, ALPRAZolam, aspirin, clonazePAM, hydrOXYzine, loperamide, ondansetron (ZOFRAN) IV  Micro Results No results found for this or any previous visit (from the past 240 hour(s)).  Radiology Reports Dg Chest 2 View  Result Date: 09/12/2016 CLINICAL DATA:  Dizziness and weakness EXAM: CHEST  2 VIEW COMPARISON:  08/09/2012 FINDINGS: Low volume chest with vascular congestion and prominent hila. Cardiomegaly. No effusion or pneumothorax. No air bronchogram. IMPRESSION: 1. Cardiomegaly and vascular congestion. 2. Fullness of the hila, likely accentuated by low volumes and #1. Hilar adenopathy is a possibility and two-view chest x-ray is recommended after convalescence. Electronically Signed   By: Marnee Spring M.D.   On: 09/12/2016 08:07    Time Spent in minutes  25   Eddie North M.D on 09/14/2016 at 2:11 PM  Between 7am to 7pm - Pager - [phone]  After 7pm go to [email or web address] - password [facility]  [facility] -  Office  [phone]

## 2016-09-14 NOTE — Progress Notes (Signed)
I.V. Team was unable to get I.V. And lab was unable to get lab draws.

## 2016-09-14 NOTE — Consult Note (Signed)
Cardiology Consult    Patient ID: [name] MRN: [id number], DOB/AGE: April 18, 1946   Admit date: 09/12/2016 Date of Consult: 09/14/2016  Primary Physician: Sherian Maroon, MD Primary Cardiologist: New  Requesting Provider: [place] Reason for Consultation: Chest pain  [name] is a 71 y.o. female who is being seen today for the evaluation of chest pain at the request of Dr. Clementeen Graham.  Patient Profile    71 yo female with PMH of CAD s/p stenting of RCA (07), DM, HTN, HL, systolic HF, and ERSD on HD who presented with ongoing chest pain and exertional dyspnea.   Past Medical History   Past Medical History:  Diagnosis Date  . Anxiety   . Asthma   . CAD (coronary artery disease)    s/p MI x 2, s/p stent x 4 (05/2005 with DES placed at that time)  . Carpal tunnel syndrome, bilateral   . Chronic systolic congestive heart failure ([place])    EF 35-40% per 2D echo (03/2011)  . Constipation   . Diabetes mellitus type 2, controlled, with complications ([place])    diet controlled  . Diabetic retinopathy   . ESRD (end stage renal disease) on dialysis [facility]) HD since 07/2008   [facility]; Mon, [place], Friday  . Fibromyalgia   . Hemorrhoids   . Hyperlipidemia   . Hypertension   . Hypothyroid   . Major depression   . Migraines   . Morphea   . Normocytic anemia    BL Hgb 10-12  //  Chronic, likely multifactorial AOCD and possible iron deficiency component with transferrin sat < 20 historically.  . Osteoporosis   . PAD (peripheral artery disease) (HCC)    s/p L BKA  . Peripheral neuropathy   . PONV (postoperative nausea and vomiting)   . Pulmonary hypertension (HCC)    PA Peak pressure 57 mmHg per 2D echo (03/2011)  . Refusal of blood transfusions as patient is Jehovah's Witness   . Secondary hyperparathyroidism ([place])   . Vitamin D deficiency     Past Surgical History:  Procedure Laterality Date  . AV FISTULA PLACEMENT  ~ 2011   right upper arm  . BYPASS GRAFT  2006    . CARDIAC CATHETERIZATION  2006, 2007   pt had 4 stents placed  . CATARACT EXTRACTION, BILATERAL Bilateral 2000  . CHOLECYSTECTOMY  1987  . HARDWARE REMOVAL Left 07/03/2016   Procedure: LEFT LEG HARDWARE REMOVAL, REMOVAL OF PINS;  Surgeon: Meredith Pel, MD;  Location: [place];  Service: Orthopedics;  Laterality: Left;  . HEMIARTHROPLASTY HIP Left 10/2009  . JOINT REPLACEMENT  2007   Partial placement Left Hip  . LEG AMPUTATION BELOW KNEE Left 10/2008   left  . ORIF FEMUR FRACTURE Left 06/09/2016   Procedure: CLOSED REDUCTION,  CANNULATED SCREWS PINNING;  Surgeon: Meredith Pel, MD;  Location: [place];  Service: Orthopedics;  Laterality: Left;  . PERIPHERAL ARTERIAL STENT GRAFT Bilateral    bilaterally "(left ~ 2010; right ~ 2011"  . VAGINAL HYSTERECTOMY  1994   with BSO    Allergies  Allergies  Allergen Reactions  . Morphine And Related Other (See Comments)    Pt fell into deep sleep and could not be woken up had to be resuscitated says she did not overdose, unsure whether throat swelled or SOB pt does not remember  . Other Anaphylaxis    PEACHES  . Penicillins Swelling    Tongue swelling.   Marland Kitchen Pumpkin Seed Oil-Saw Palmetto-Zinc [  Propalmex] Anaphylaxis  . Shrimp [Shellfish Allergy] Anaphylaxis    History of Present Illness    [name] is a 71 yo female with PMH of CAD s/p stenting of RCA (07), DM, HTN, HL, systolic HF, and ERSD on HD. Last Cath in '07 with stenting x4 to RCA. Reports she was followed by cardiology for a period of time, but then transitioned to her PCP. Currently with ERSD on HD for the past 9 years. Has HD MWF at [facility]. Known ICM with last EF 25-30%.   In her usual state of health until about 3 weeks ago. Began to experience intermittent episodes of centralized chest pain. Also having to stop and sit during her daily activities due to worsening dyspnea. Normally cooks and cleans for herself, but having trouble over the past weeks due to exertional  symptoms. Had HD on Friday, started having chest pain, became pale and dyspneic. Sent to the ED for further work up. Also also had radiation of pain into her left arm, has felt that these epsiodes are coming more often.   EKS shows ST with known diffuse ST changes, but actually improved from past EKGs. Trop cycled 0.2, but known ERSD. She was scheduled for inpatient lexiscan myoview. I saw in nuc med, she denied chest pain, but given her symptoms and PMH was concerned. Discussed with DOD Dr. Haroldine Laws who agreed she needs LHC. Carlton Adam was canceled for today.   Inpatient Medications    . calcium carbonate  3 tablet Oral TID  . docusate sodium  100 mg Oral BID  . [START ON 09/15/2016] doxercalciferol  2 mcg Intravenous Q M,W,F-HD  . escitalopram  30 mg Oral Daily  . heparin  5,000 Units Subcutaneous Q8H  . irbesartan  150 mg Oral Daily  . levothyroxine  100 mcg Oral QAC breakfast  . metoprolol succinate  25 mg Oral BID  . nortriptyline  25 mg Oral QHS  . pravastatin  20 mg Oral q1800  . regadenoson  0.4 mg Intravenous Once  . senna-docusate  2 tablet Oral BID    Family History    Family History  Problem Relation Age of Onset  . Alcohol abuse Father   . Multiple myeloma Mother   . Diabetes type II Mother   . Hypertension Mother   . Cancer Mother   . Diabetes Mother   . Appendicitis Brother     Social History    Social History   Social History  . Marital status: Divorced    Spouse name: N/A  . Number of children: N/A  . Years of education: N/A   Occupational History  . retired Unemployed    was [employer], disabled since 2006   Social History Main Topics  . Smoking status: Former Smoker    Packs/day: 2.00    Years: 30.00    Types: Cigarettes    Quit date: 04/07/1989  . Smokeless tobacco: Never Used  . Alcohol use No     Comment: "socially; I'm not an alcoholic"  . Drug use: No  . Sexual activity: Not on file   Other Topics Concern  . Not on file   Social  History Narrative   Lives with her mother who is ill with lung CA in remission.  The two live in her apartment.  She is a Restaurant manager, fast food.  Enjoys reading, collecting dolls, and working on computer.     Review of Systems    General:  No chills, fever, night sweats or weight  changes.  Cardiovascular:  See HPI Dermatological: No rash, lesions/masses Respiratory: No cough, dyspnea Urologic: No hematuria, dysuria Abdominal:   No nausea, vomiting, diarrhea, bright red blood per rectum, melena, or hematemesis Neurologic:  No visual changes, wkns, changes in mental status. All other systems reviewed and are otherwise negative except as noted above.  Physical Exam    Blood pressure 136/72, pulse (!) 57, temperature 97.6 F (36.4 C), temperature source Axillary, resp. rate 18, weight 177 lb 3.2 oz (80.4 kg), SpO2 92 %.  General: Pleasant older female, NAD Psych: Normal affect. Neuro: Alert and oriented X 3. Moves all extremities spontaneously. HEENT: Normal  Neck: Supple without bruits + JVD. Lungs:  Resp regular and unlabored, CTA. Heart: RRR no s3, s4, or murmurs. Abdomen: Soft, non-tender, non-distended, BS + x 4.  Extremities: No clubbing, cyanosis or edema. Left BKA, right AV fistula  Labs    Troponin (Point of Care Test)  Recent Labs  09/12/16 1211  TROPIPOC 0.25*    Recent Labs  09/12/16 2026  TROPONINI 0.23*   Lab Results  Component Value Date   WBC 5.1 09/13/2016   HGB 11.5 (L) 09/13/2016   HCT 36.9 09/13/2016   MCV 95.8 09/13/2016   PLT 131 (L) 09/13/2016    Recent Labs Lab 09/12/16 0826 09/13/16 1210  NA 134* 132*  K 5.4* 3.5  CL 90* 91*  CO2 24 23  BUN 25* 37*  CREATININE 4.70* 5.76*  CALCIUM 8.3* 8.1*  PROT 6.0*  --   BILITOT 2.2*  --   ALKPHOS 116  --   ALT 172*  --   AST 90*  --   GLUCOSE 71 87   Lab Results  Component Value Date   CHOL  02/12/2010    144        ATP III CLASSIFICATION:  <200     mg/dL   Desirable  200-239  mg/dL    Borderline High  >=240    mg/dL   High          HDL 42 02/12/2010   LDLCALC  02/12/2010    76        Total Cholesterol/HDL:CHD Risk Coronary Heart Disease Risk Table                     Men   Women  1/2 Average Risk   3.4   3.3  Average Risk       5.0   4.4  2 X Average Risk   9.6   7.1  3 X Average Risk  23.4   11.0        Use the calculated Patient Ratio above and the CHD Risk Table to determine the patient's CHD Risk.        ATP III CLASSIFICATION (LDL):  <100     mg/dL   Optimal  100-129  mg/dL   Near or Above                    Optimal  130-159  mg/dL   Borderline  160-189  mg/dL   High  >190     mg/dL   Very High   TRIG 128 02/12/2010   No results found for: [facility]   Radiology Studies    Dg Chest 2 View  Result Date: 09/12/2016 CLINICAL DATA:  Dizziness and weakness EXAM: CHEST  2 VIEW COMPARISON:  08/09/2012 FINDINGS: Low volume chest with vascular congestion and prominent hila. Cardiomegaly. No effusion or  pneumothorax. No air bronchogram. IMPRESSION: 1. Cardiomegaly and vascular congestion. 2. Fullness of the hila, likely accentuated by low volumes and #1. Hilar adenopathy is a possibility and two-view chest x-ray is recommended after convalescence. Electronically Signed   By: Monte Fantasia M.D.   On: 09/12/2016 08:07    ECG & Cardiac Imaging    EKG: SR with diffuse ST changes, actually improved from previous EKGs  Echo: 11/12  Study Conclusions  - Left ventricle: Diffuse hypokinesis worse in the posterior lateral and inferior walls The cavity size was moderately dilated. There was mild concentric hypertrophy. Systolic function was moderately reduced. The estimated ejection fraction was in the range of 35% to 40%. - Mitral valve: Moderate regurgitation. - Left atrium: The atrium was moderately dilated. - Atrial septum: No defect or patent foramen ovale was identified. - Pulmonary arteries: PA peak pressure: 87m Hg (S).  Assessment & Plan     71yo female with PMH of CAD s/p stenting of RCA (07), DM, HTN, HL, combined HF, and ESRD on HD who presented with ongoing chest pain and exertional dyspnea.   1. Unstable Angina: Known CAD with last ischemic evaluation in 2007 with stenting to the RCA. Symptoms concerning for ACS. Mild troponin elevation. Currently pain free. Given reported symptoms[facility]  -- The patient understands that risks included but are not limited to stroke (1 in 1000), death (1 in 168, kidney failure [usually temporary] (1 in 500), bleeding (1 in 200), allergic reaction [possibly serious] (1 in 200).   2. ESRD on HD: Nephrology following. Last HD yesterday. Will pan for HD post cath.   3. Chronic Systolic HF: Does not appear to be significantly volume overloaded. Management per renal  4. HL: on statin -- check lipids  5. Hx of DM: states this is now diet controlled   Signed, LReino Bellis NP-C Pager [phone]/22/2018, 11:04 AM  Patient seen and examined with LReino BellisNP-C. We discussed all aspects of the encounter. I agree with the assessment and plan as stated above.   She has had progressive chest pressure over past 3 weeks worse with exertion. Pressure radiates to shoulder. Relieved with rest. Very similar to previous anginal symptoms. Troponin mildly elevated in setting of ESRD. ECG non-acute. Very high pre-test probability for unstable angina. She will need cardiac cath. She agrees to proceed. Rx with ASA, statin and b-blocker. If CP recurs start heparin. Will order repeat echo.   I have reviewed the risks, indications, and alternatives to angioplasty and stenting with the patient. Risks include but are not limited to bleeding, infection, vascular injury, stroke, myocardial infection, arrhythmia, kidney injury, radiation-related injury in the case of prolonged fluoroscopy use, emergency cardiac surgery, and death. The patient understands the risks of serious  complication is low (<<5% and he agrees to proceed.   BGlori Bickers MD  2:09 PM

## 2016-09-15 ENCOUNTER — Encounter (HOSPITAL_COMMUNITY)
Admission: EM | Disposition: A | Discharge status: Home Health | Payer: Self-pay | Source: Home / Self Care | Attending: Internal Medicine

## 2016-09-15 DIAGNOSIS — I5021 Acute systolic (congestive) heart failure: Secondary | ICD-10-CM

## 2016-09-15 DIAGNOSIS — E785 Hyperlipidemia, unspecified: Secondary | ICD-10-CM

## 2016-09-15 DIAGNOSIS — I2510 Atherosclerotic heart disease of native coronary artery without angina pectoris: Secondary | ICD-10-CM

## 2016-09-15 DIAGNOSIS — I200 Unstable angina: Secondary | ICD-10-CM

## 2016-09-15 DIAGNOSIS — N186 End stage renal disease: Secondary | ICD-10-CM

## 2016-09-15 DIAGNOSIS — I10 Essential (primary) hypertension: Secondary | ICD-10-CM

## 2016-09-15 DIAGNOSIS — I214 Non-ST elevation (NSTEMI) myocardial infarction: Secondary | ICD-10-CM

## 2016-09-15 HISTORY — PX: LEFT HEART CATH AND CORONARY ANGIOGRAPHY: CATH118249

## 2016-09-15 LAB — CBC
HEMATOCRIT: 37.5 % (ref 36.0–46.0)
HEMOGLOBIN: 11.9 g/dL — AB (ref 12.0–15.0)
MCH: 30.7 pg (ref 26.0–34.0)
MCHC: 31.7 g/dL (ref 30.0–36.0)
MCV: 96.9 fL (ref 78.0–100.0)
PLATELETS: 184 10*3/uL (ref 150–400)
RBC: 3.87 MIL/uL (ref 3.87–5.11)
RDW: 21.9 % — ABNORMAL HIGH (ref 11.5–15.5)
WBC: 6.1 10*3/uL (ref 4.0–10.5)

## 2016-09-15 LAB — BASIC METABOLIC PANEL
ANION GAP: 14 (ref 5–15)
BUN: 38 mg/dL — ABNORMAL HIGH (ref 6–20)
CHLORIDE: 93 mmol/L — AB (ref 101–111)
CO2: 23 mmol/L (ref 22–32)
Calcium: 7.9 mg/dL — ABNORMAL LOW (ref 8.9–10.3)
Creatinine, Ser: 5.41 mg/dL — ABNORMAL HIGH (ref 0.44–1.00)
GFR calc Af Amer: 9 mL/min — ABNORMAL LOW (ref >60)
GFR, EST NON AFRICAN AMERICAN: 7 mL/min — AB (ref >60)
Glucose, Bld: 123 mg/dL — ABNORMAL HIGH (ref 65–99)
POTASSIUM: 3.5 mmol/L (ref 3.5–5.1)
Sodium: 130 mmol/L — ABNORMAL LOW (ref 135–145)

## 2016-09-15 LAB — PROTIME-INR
INR: 1.41
Prothrombin Time: 17.4 seconds — ABNORMAL HIGH (ref 11.4–15.2)

## 2016-09-15 LAB — HEPATITIS B SURFACE ANTIGEN: Hepatitis B Surface Ag: NEGATIVE

## 2016-09-15 LAB — MRSA PCR SCREENING: MRSA BY PCR: NEGATIVE

## 2016-09-15 LAB — GLUCOSE, CAPILLARY
Glucose-Capillary: 50 mg/dL — ABNORMAL LOW (ref 65–99)
Glucose-Capillary: 75 mg/dL (ref 65–99)
Glucose-Capillary: 60 mg/dL — ABNORMAL LOW (ref 65–99)

## 2016-09-15 SURGERY — LEFT HEART CATH AND CORONARY ANGIOGRAPHY
Anesthesia: LOCAL

## 2016-09-15 MED ORDER — SODIUM CHLORIDE 0.9% FLUSH
3.0000 mL | Freq: Two times a day (BID) | INTRAVENOUS | Status: DC
  Administered 2016-09-15 – 2016-09-16 (×2): 3 mL via INTRAVENOUS

## 2016-09-15 MED ORDER — SODIUM CHLORIDE 0.9% FLUSH: 3.0000 mL | INTRAVENOUS | Status: DC | PRN

## 2016-09-15 MED ORDER — HYDRALAZINE HCL 20 MG/ML IJ SOLN
INTRAMUSCULAR | Status: AC
  Filled 2016-09-15: qty 1

## 2016-09-15 MED ORDER — IOPAMIDOL (ISOVUE-370) INJECTION 76%
INTRAVENOUS | Status: DC | PRN
  Administered 2016-09-15: 60 mL via INTRAVENOUS

## 2016-09-15 MED ORDER — LIDOCAINE HCL (PF) 1 % IJ SOLN
INTRAMUSCULAR | Status: DC | PRN
  Administered 2016-09-15: 14 mL

## 2016-09-15 MED ORDER — IOPAMIDOL (ISOVUE-370) INJECTION 76%
INTRAVENOUS | Status: AC
  Filled 2016-09-15: qty 100

## 2016-09-15 MED ORDER — DIPHENHYDRAMINE HCL 50 MG/ML IJ SOLN
INTRAMUSCULAR | Status: AC
  Filled 2016-09-15: qty 1

## 2016-09-15 MED ORDER — DIPHENHYDRAMINE HCL 50 MG/ML IJ SOLN
25.0000 mg | Freq: Once | INTRAMUSCULAR | Status: AC
  Administered 2016-09-15: 25 mg via INTRAVENOUS

## 2016-09-15 MED ORDER — ALPRAZOLAM 0.25 MG PO TABS
ORAL_TABLET | ORAL | Status: AC
  Filled 2016-09-15: qty 1

## 2016-09-15 MED ORDER — DEXTROSE 50 % IV SOLN
25.0000 mL | Freq: Once | INTRAVENOUS | Status: AC
  Administered 2016-09-15: 25 mL via INTRAVENOUS

## 2016-09-15 MED ORDER — LIDOCAINE HCL (PF) 1 % IJ SOLN
INTRAMUSCULAR | Status: AC
  Filled 2016-09-15: qty 30

## 2016-09-15 MED ORDER — SODIUM CHLORIDE 0.9 % IV SOLN: 250.0000 mL | INTRAVENOUS | Status: DC | PRN

## 2016-09-15 MED ORDER — HEPARIN (PORCINE) IN NACL 2-0.9 UNIT/ML-% IJ SOLN
INTRAMUSCULAR | Status: DC | PRN
  Administered 2016-09-15 (×2): 1000 mL

## 2016-09-15 MED ORDER — DOXERCALCIFEROL 4 MCG/2ML IV SOLN
INTRAVENOUS | Status: AC
  Filled 2016-09-15: qty 2

## 2016-09-15 MED ORDER — HYDRALAZINE HCL 20 MG/ML IJ SOLN
INTRAMUSCULAR | Status: DC | PRN
  Administered 2016-09-15: 10 mg via INTRAVENOUS

## 2016-09-15 MED ORDER — DEXTROSE 50 % IV SOLN
INTRAVENOUS | Status: AC
  Filled 2016-09-15: qty 50

## 2016-09-15 MED ORDER — MIDAZOLAM HCL 2 MG/2ML IJ SOLN
INTRAMUSCULAR | Status: DC | PRN
  Administered 2016-09-15: 1 mg via INTRAVENOUS

## 2016-09-15 MED ORDER — MIDAZOLAM HCL 2 MG/2ML IJ SOLN
INTRAMUSCULAR | Status: AC
  Filled 2016-09-15: qty 2

## 2016-09-15 MED ORDER — HEPARIN (PORCINE) IN NACL 2-0.9 UNIT/ML-% IJ SOLN
INTRAMUSCULAR | Status: AC
  Filled 2016-09-15: qty 1000

## 2016-09-15 SURGICAL SUPPLY — 6 items
CATH INFINITI 5FR MULTPACK ANG (CATHETERS) ×2 IMPLANT
KIT HEART LEFT (KITS) ×2 IMPLANT
PACK CARDIAC CATHETERIZATION (CUSTOM PROCEDURE TRAY) ×2 IMPLANT
SHEATH PINNACLE 5F 10CM (SHEATH) ×2 IMPLANT
TRANSDUCER W/STOPCOCK (MISCELLANEOUS) ×2 IMPLANT
WIRE EMERALD 3MM-J .035X150CM (WIRE) ×2 IMPLANT

## 2016-09-15 NOTE — Progress Notes (Signed)
 Progress Note  Patient Name: [name] Date of Encounter: 09/15/2016  Primary Cardiologist: new  Subjective   Feels well this am. No SOB or chest pain. Notes she had dialysis on Sat.   Inpatient Medications    Scheduled Meds: . calcium carbonate  3 tablet Oral TID  . clotrimazole   Topical BID  . docusate sodium  100 mg Oral BID  . doxercalciferol  2 mcg Intravenous Q M,W,F-HD  . escitalopram  30 mg Oral Daily  . heparin  5,000 Units Subcutaneous Q8H  . irbesartan  150 mg Oral Daily  . lanthanum  1,000 mg Oral TID WC  . levothyroxine  100 mcg Oral QAC breakfast  . metoprolol succinate  25 mg Oral BID  . multivitamin  1 tablet Oral QHS  . nortriptyline  25 mg Oral QHS  . pravastatin  20 mg Oral q1800  . senna-docusate  2 tablet Oral BID  . sodium chloride flush  3 mL Intravenous Q12H   Continuous Infusions: . sodium chloride     PRN Meds: sodium chloride, acetaminophen, ALPRAZolam, aspirin, clonazePAM, hydrOXYzine, loperamide, ondansetron (ZOFRAN) IV, sodium chloride flush   Vital Signs    Vitals:   09/14/16 0916 09/14/16 1341 09/14/16 2114 09/15/16 0645  BP: 136/72 125/66 (!) 144/52 131/60  Pulse:  65  70  Resp:  18 20 18  Temp:  97.5 F (36.4 C) 98 F (36.7 C) 98.3 F (36.8 C)  TempSrc:  Oral Axillary Oral  SpO2:  91% 91% 94%  Weight:    179 lb (81.2 kg)    Intake/Output Summary (Last 24 hours) at 09/15/16 0853 Last data filed at 09/15/16 0001  Gross per 24 hour  Intake              420 ml  Output                0 ml  Net              420 ml   Filed Weights   09/13/16 1529 09/14/16 0443 09/15/16 0645  Weight: 171 lb 15.3 oz (78 kg) 177 lb 3.2 oz (80.4 kg) 179 lb (81.2 kg)    Telemetry    NSR - Personally Reviewed  ECG    NSR nonspecific IVCD - Personally Reviewed  Physical Exam   GEN: No acute distress. overweigtht.  Neck: No JVD Cardiac: RRR, no murmurs, rubs, or gallops.  Respiratory: Clear to auscultation bilaterally. GI: Soft,  nontender, non-distended  MS: No edema; left BKA. AV fistula on right arm. Yeast infection in groin area.  Neuro:  Nonfocal  Psych: Normal affect   Labs    Chemistry Recent Labs Lab 09/12/16 0826 09/13/16 1210 09/14/16 1809  NA 134* 132* 131*  K 5.4* 3.5 3.7  CL 90* 91* 91*  CO2 24 23 23  GLUCOSE 71 87 80  BUN 25* 37* 25*  CREATININE 4.70* 5.76* 4.58*  CALCIUM 8.3* 8.1* 8.1*  PROT 6.0*  --  5.7*  ALBUMIN 3.2* 2.9* 3.0*  AST 90*  --  131*  ALT 172*  --  143*  ALKPHOS 116  --  154*  BILITOT 2.2*  --  1.8*  GFRNONAA 9* 7* 9*  GFRAA 10* 8* 10*  ANIONGAP 20* 18* 17*     Hematology Recent Labs Lab 09/12/16 0740 09/13/16 1210 09/14/16 1809  WBC 7.6 5.1 5.9  RBC 3.92 3.85* 4.20  HGB 11.8* 11.5* 12.8  HCT 38.1 36.9 40.8    MCV 97.2 95.8 97.1  MCH 30.1 29.9 30.5  MCHC 31.0 31.2 31.4  RDW 20.7* 20.7* 21.4*  PLT 149* 131* 169    Cardiac Enzymes Recent Labs Lab 09/12/16 2026  TROPONINI 0.23*    Recent Labs Lab 09/12/16 0744 09/12/16 1211  TROPIPOC 0.30* 0.25*     BNPNo results for input(s): BNP, PROBNP in the last 168 hours.   DDimer No results for input(s): DDIMER in the last 168 hours.   Radiology    No results found.  Cardiac Studies   Echo: 09/14/16: Study Conclusions  - Left ventricle: The cavity size was normal. There was moderate   focal basal hypertrophy. Systolic function was mildly to   moderately reduced. The estimated ejection fraction was in the   range of 40% to 45%. Moderate diffuse hypokinesis. Features are   consistent with a pseudonormal left ventricular filling pattern,   with concomitant abnormal relaxation and increased filling   pressure (grade 2 diastolic dysfunction). Doppler parameters are   consistent with high ventricular filling pressure. Ratio of   mitral valve peak E velocity to average of medial and lateral   annulus peak E velocity: 23.71. - Aortic valve: Trileaflet; normal thickness, mildly calcified   leaflets. -  Mitral valve: Moderately calcified annulus. Mildly thickened,   mildly calcified leaflets anterior. There was mild regurgitation. - Left atrium: The atrium was moderately dilated.   Anterior-posterior dimension: 38 mm. Volume/bsa, ES, (1-plane   Simpson&'s, A2C): 58.1 ml/m^2. - Right ventricle: The cavity size was moderately dilated. Wall   thickness was normal. Systolic function was mildly reduced. - Right atrium: The atrium was moderately to severely dilated. - Tricuspid valve: There was moderate regurgitation. - Pulmonary arteries: PA peak pressure: 52 mm Hg (S).  Impressions:  - The right ventricular systolic pressure was increased consistent   with moderate pulmonary hypertension.  Patient Profile     71 y.o. female with PMH of CAD s/p stenting of LAD in 06, RCA (07) with [place] stents, DM, HTN, HL, systolic HF, and ERSD on HD who presented with ongoing chest pain and exertional dyspnea.   Assessment & Plan    1. Unstable Angina: Known CAD with last ischemic evaluation in 2007 with stenting to the RCA. Prior stenting of LAD in 2006.  Symptoms concerning for ACS. Mild troponin elevation. Currently pain free. Given reported symptoms and LV dysfunction on Echo, will plan for [facility] tomorrow.  -- The patient understands that risks included but are not limited to stroke (1 in 1000), death (1 in 1000), kidney failure [usually temporary] (1 in 500), bleeding (1 in 200), allergic reaction [possibly serious] (1 in 200).   2. ESRD on HD: Nephrology following. Last HD Sat. Will plan for HD post cath.   3. Chronic Systolic HF: Does not appear to be significantly volume overloaded. Management per renal. EF looks stable compared with 2012. 35-40%>> 40-45%.   4. HL: on statin -- check lipids  5. Hx of DM: states this is now diet controlled   Signed, Laderrick Wilk [place], MD  09/15/2016, 8:53 AM

## 2016-09-15 NOTE — Care Management Note (Addendum)
Case Management Note Donn Pierini RN, BSN Unit 2W-Case Manager [phone]  Patient Details  Name: [name] MRN: [id number] Date of Birth: 1945-09-15  Subjective/Objective:   Pt admitted with chest pain, hx of ESRD- HD-m/w/f                 Action/Plan: PTA pt lived at home with family- is active with [name]. CM to follow for d/c needs  Expected Discharge Date:                  Expected Discharge Plan:  Home/Self Care  In-House Referral:     Discharge planning Services  CM Consult  Post Acute Care Choice:    Choice offered to:     DME Arranged:    DME Agency:     HH Arranged:    HH Agency:     Status of Service:  In process, will continue to follow  If discussed at Long Length of Stay Meetings, dates discussed:    Additional Comments:  09/16/16- 1400- Donn Pierini RN, CM- received call from [facility]- CSWJudithann Sauger- [phone]- regarding d/c plans- [facility] would be agreeable to contract for SNF bed if pt agreeable- pt has mentioned [facility]. Will have CSW follow up with pt to see if pt agreeable. (per [facility] pt would not PT eval for them to cover SNF stay)  Darrold Span, RN 09/15/2016, 11:04 AM

## 2016-09-15 NOTE — Progress Notes (Signed)
PROGRESS NOTE                                                                                                                                                                                                             Patient Demographics:    [name], is a 71 y.o. female, DOB - Apr 17, 1946, ZOX:[id number]  Admit date - 09/12/2016   Admitting Physician Levie Heritage, DO  Outpatient Primary MD for the patient is Thane Edu, MD  LOS - 1  Outpatient Specialists:Nephrology  Chief Complaint  Patient presents with  . Hypertension       Brief Narrative   71 year old female with history of moderate to severe systolic CHF (EF 81-19%), coronary artery disease with RCA stenting in 2007, diet-controlled diabetes mellitus, ESRD on hemodialysis (M, W, F), peripheral arterial disease status post left BKA, recent left femur fracture and left hip arthroplasty, resident of SNF who developed sudden onset of dizziness associated with dyspnea and chest pain during hemodialysis. She was also found to be in hypertensive crisis with systolic blood pressure >200 and was sent to the ED via EMS.  In the ED she was hypotensive with BP of 182/105 mmHg, tachypneic, afebrile. Blood work showed K of 5.4, creatinine 4.7 with mildly elevated troponin (0.3, 0.25) she was noted to have EKG without acute changes. Patient admitted to hospitalist service for hypertensive crisis and chest pain symptoms. She was scheduled for stress test but given her progressive chest pain symptoms for past 3 weeks worsened with exertion was concerning for unstable angina and cardiology decided with cardiac cath.   Subjective:   Denies any chest pain this morning.   Assessment  & Plan :   Principal problem Unstable angina Continue aspirin, statin and beta blocker. No further chest pain symptoms. Cardiology plan on left heart catheterization  today.   Acute exacerbation of chronic systolic CHF. 2-D echo showed moderately reduced EF of 40-45% with diffuse hypokinesis and grade 2 diastolic dysfunction. Also had moderate pulmonary hypertension. Managed with hemodialysis. Continue aspirin, beta blocker and ARB.  Hypertensive urgency Likely contributed by unstable angina involving overload. Improved after dialysis.   ESRD on HD Getting scheduled dialysis here. Nephrology following. Plan on hemodialysis after cardiac cath today.  Anemia of chronic kidney disease Stable.  Chronic depression Continue Lexapro and amitriptyline.  Hyperlipidemia Continue statin  Code Status : DO NOT RESUSCITATE  Family Communication  : None at bedside  Disposition Plan  : Pending cardiac workup  Barriers For Discharge : Active symptoms. Cardiac cath today  Consults  :   Cardiology Renal  Procedures  :  Hemodialysis  DVT Prophylaxis  :  Heparin  Lab Results  Component Value Date   PLT 169 09/14/2016    Antibiotics  :    Anti-infectives    None        Objective:   Vitals:   09/14/16 0916 09/14/16 1341 09/14/16 2114 09/15/16 0645  BP: 136/72 125/66 (!) 144/52 131/60  Pulse:  65  70  Resp:  Temp:  97.5 F (36.4 C) 98 F (36.7 C) 98.3 F (36.8 C)  TempSrc:  Oral Axillary Oral  SpO2:  91% 91% 94%  Weight:    81.2 kg (179 lb)    Wt Readings from Last 3 Encounters:  09/15/16 81.2 kg (179 lb)  08/07/16 81.2 kg (179 lb)  07/03/16 81.6 kg (179 lb 14.4 oz)     Intake/Output Summary (Last 24 hours) at 09/15/16 1046 Last data filed at 09/15/16 0001  Gross per 24 hour  Intake              420 ml  Output                0 ml  Net              420 ml     Physical Exam  Gen: Elderly female not in distress HEENT: moist mucosa, supple neck Chest: Clear to auscultation bilaterally CVS: N S1&S2, no murmurs,  GI: soft, NT, ND,  Musculoskeletal: warm, no edema, left BKA     Data Review:     CBC  Recent Labs Lab 09/12/16 0740 09/13/16 1210 09/14/16 1809  WBC 7.6 5.1 5.9  HGB 11.8* 11.5* 12.8  HCT 38.1 36.9 40.8  PLT 149* 131* 169  MCV 97.2 95.8 97.1  MCH 30.1 29.9 30.5  MCHC 31.0 31.2 31.4  RDW 20.7* 20.7* 21.4*  LYMPHSABS 1.2  --   --   MONOABS 0.5  --   --   EOSABS 0.2  --   --   BASOSABS 0.0  --   --     Chemistries   Recent Labs Lab 09/12/16 0826 09/13/16 1210 09/14/16 1809  NA 134* 132* 131*  K 5.4* 3.5 3.7  CL 90* 91* 91*  CO2 GLUCOSE 71 87 80  BUN 25* 37* 25*  CREATININE 4.70* 5.76* 4.58*  CALCIUM 8.3* 8.1* 8.1*  AST 90*  --  131*  ALT 172*  --  143*  ALKPHOS 116  --  154*  BILITOT 2.2*  --  1.8*   ------------------------------------------------------------------------------------------------------------------ No results for input(s): CHOL, HDL, LDLCALC, TRIG, CHOLHDL, LDLDIRECT in the last 72 hours.  Lab Results  Component Value Date   HGBA1C (H) 02/12/2010    6.3 (NOTE)                                                                       According to the ADA Clinical Practice Recommendations for 2011, when HbA1c is used as a  screening test:   >=6.5%   Diagnostic of Diabetes Mellitus           (if abnormal result  is confirmed)  5.7-6.4%   Increased risk of developing Diabetes Mellitus  References:Diagnosis and Classification of Diabetes Mellitus,Diabetes Care,2011,34(Suppl 1):S62-S69 and Standards of Medical Care in         Diabetes - 2011,Diabetes Care,2011,34  (Suppl 1):S11-S61.   ------------------------------------------------------------------------------------------------------------------ No results for input(s): TSH, T4TOTAL, T3FREE, THYROIDAB in the last 72 hours.  Invalid input(s): FREET3 ------------------------------------------------------------------------------------------------------------------ No results for input(s): VITAMINB12, FOLATE, FERRITIN, TIBC, IRON, RETICCTPCT in the last 72  hours.  Coagulation profile  Recent Labs Lab 09/15/16 0237  INR 1.41    No results for input(s): DDIMER in the last 72 hours.  Cardiac Enzymes  Recent Labs Lab 09/12/16 2026  TROPONINI 0.23*   ------------------------------------------------------------------------------------------------------------------ No results found for: BNP  Inpatient Medications  Scheduled Meds: . calcium carbonate  3 tablet Oral TID  . clotrimazole   Topical BID  . docusate sodium  100 mg Oral BID  . doxercalciferol  2 mcg Intravenous Q M,W,F-HD  . escitalopram  30 mg Oral Daily  . heparin  5,000 Units Subcutaneous Q8H  . irbesartan  150 mg Oral Daily  . lanthanum  1,000 mg Oral TID WC  . levothyroxine  100 mcg Oral QAC breakfast  . metoprolol succinate  25 mg Oral BID  . multivitamin  1 tablet Oral QHS  . nortriptyline  25 mg Oral QHS  . pravastatin  20 mg Oral q1800  . senna-docusate  2 tablet Oral BID  . sodium chloride flush  3 mL Intravenous Q12H   Continuous Infusions: . sodium chloride     PRN Meds:.sodium chloride, acetaminophen, ALPRAZolam, aspirin, clonazePAM, hydrOXYzine, loperamide, ondansetron (ZOFRAN) IV, sodium chloride flush  Micro Results No results found for this or any previous visit (from the past 240 hour(s)).  Radiology Reports Dg Chest 2 View  Result Date: 09/12/2016 CLINICAL DATA:  Dizziness and weakness EXAM: CHEST  2 VIEW COMPARISON:  08/09/2012 FINDINGS: Low volume chest with vascular congestion and prominent hila. Cardiomegaly. No effusion or pneumothorax. No air bronchogram. IMPRESSION: 1. Cardiomegaly and vascular congestion. 2. Fullness of the hila, likely accentuated by low volumes and #1. Hilar adenopathy is a possibility and two-view chest x-ray is recommended after convalescence. Electronically Signed   By: Marnee Spring M.D.   On: 09/12/2016 08:07    Time Spent in minutes  25   Eddie North M.D on 09/15/2016 at 10:46 AM  Between 7am to 7pm  - Pager - [phone]  After 7pm go to [email or web address] - password [facility]  [facility] -  Office  [phone]

## 2016-09-15 NOTE — Interval H&P Note (Signed)
History and Physical Interval Note:  09/15/2016 2:24 PM  [name]  has presented today for cardiac cath with the diagnosis of unstable angina/NSTEMI.  The various methods of treatment have been discussed with the patient and family. After consideration of risks, benefits and other options for treatment, the patient has consented to  Procedure(s): Left Heart Cath and Coronary Angiography (N/A) as a surgical intervention .  The patient's history has been reviewed, patient examined, no change in status, stable for surgery.  I have reviewed the patient's chart and labs.  Questions were answered to the patient's satisfaction.    Cath Lab Visit (complete for each Cath Lab visit)  Clinical Evaluation Leading to the Procedure:   ACS: Yes.    Non-ACS:    Anginal Classification: CCS III  Anti-ischemic medical therapy: Minimal Therapy (1 class of medications)  Non-Invasive Test Results: No non-invasive testing performed  Prior CABG: No previous CABG        [name]

## 2016-09-15 NOTE — H&P (View-Only) (Signed)
Progress Note  Patient Name: [name] Date of Encounter: 09/15/2016  Primary Cardiologist: new  Subjective   Feels well this am. No SOB or chest pain. Notes she had dialysis on Sat.   Inpatient Medications    Scheduled Meds: . calcium carbonate  3 tablet Oral TID  . clotrimazole   Topical BID  . docusate sodium  100 mg Oral BID  . doxercalciferol  2 mcg Intravenous Q M,W,F-HD  . escitalopram  30 mg Oral Daily  . heparin  5,000 Units Subcutaneous Q8H  . irbesartan  150 mg Oral Daily  . lanthanum  1,000 mg Oral TID WC  . levothyroxine  100 mcg Oral QAC breakfast  . metoprolol succinate  25 mg Oral BID  . multivitamin  1 tablet Oral QHS  . nortriptyline  25 mg Oral QHS  . pravastatin  20 mg Oral q1800  . senna-docusate  2 tablet Oral BID  . sodium chloride flush  3 mL Intravenous Q12H   Continuous Infusions: . sodium chloride     PRN Meds: sodium chloride, acetaminophen, ALPRAZolam, aspirin, clonazePAM, hydrOXYzine, loperamide, ondansetron (ZOFRAN) IV, sodium chloride flush   Vital Signs    Vitals:   09/14/16 0916 09/14/16 1341 09/14/16 2114 09/15/16 0645  BP: 136/72 125/66 (!) 144/52 131/60  Pulse:  65  70  Resp:  Temp:  97.5 F (36.4 C) 98 F (36.7 C) 98.3 F (36.8 C)  TempSrc:  Oral Axillary Oral  SpO2:  91% 91% 94%  Weight:    179 lb (81.2 kg)    Intake/Output Summary (Last 24 hours) at 09/15/16 0853 Last data filed at 09/15/16 0001  Gross per 24 hour  Intake              420 ml  Output                0 ml  Net              420 ml   Filed Weights   09/13/16 1529 09/14/16 0443 09/15/16 0645  Weight: 171 lb 15.3 oz (78 kg) 177 lb 3.2 oz (80.4 kg) 179 lb (81.2 kg)    Telemetry    NSR - Personally Reviewed  ECG    NSR nonspecific IVCD - Personally Reviewed  Physical Exam   GEN: No acute distress. overweigtht.  Neck: No JVD Cardiac: RRR, no murmurs, rubs, or gallops.  Respiratory: Clear to auscultation bilaterally. GI: Soft,  nontender, non-distended  MS: No edema; left BKA. AV fistula on right arm. Yeast infection in groin area.  Neuro:  Nonfocal  Psych: Normal affect   Labs    Chemistry Recent Labs Lab 09/12/16 0826 09/13/16 1210 09/14/16 1809  NA 134* 132* 131*  K 5.4* 3.5 3.7  CL 90* 91* 91*  CO2 GLUCOSE 71 87 80  BUN 25* 37* 25*  CREATININE 4.70* 5.76* 4.58*  CALCIUM 8.3* 8.1* 8.1*  PROT 6.0*  --  5.7*  ALBUMIN 3.2* 2.9* 3.0*  AST 90*  --  131*  ALT 172*  --  143*  ALKPHOS 116  --  154*  BILITOT 2.2*  --  1.8*  GFRNONAA 9* 7* 9*  GFRAA 10* 8* 10*  ANIONGAP 20* 18* 17*     Hematology Recent Labs Lab 09/12/16 0740 09/13/16 1210 09/14/16 1809  WBC 7.6 5.1 5.9  RBC 3.92 3.85* 4.20  HGB 11.8* 11.5* 12.8  HCT 38.1 36.9 40.8  MCV 97.2 95.8 97.1  MCH 30.1 29.9 30.5  MCHC 31.0 31.2 31.4  RDW 20.7* 20.7* 21.4*  PLT 149* 131* 169    Cardiac Enzymes Recent Labs Lab 09/12/16 2026  TROPONINI 0.23*    Recent Labs Lab 09/12/16 0744 09/12/16 1211  TROPIPOC 0.30* 0.25*     BNPNo results for input(s): BNP, PROBNP in the last 168 hours.   DDimer No results for input(s): DDIMER in the last 168 hours.   Radiology    No results found.  Cardiac Studies   Echo: 09/14/16: Study Conclusions  - Left ventricle: The cavity size was normal. There was moderate   focal basal hypertrophy. Systolic function was mildly to   moderately reduced. The estimated ejection fraction was in the   range of 40% to 45%. Moderate diffuse hypokinesis. Features are   consistent with a pseudonormal left ventricular filling pattern,   with concomitant abnormal relaxation and increased filling   pressure (grade 2 diastolic dysfunction). Doppler parameters are   consistent with high ventricular filling pressure. Ratio of   mitral valve peak E velocity to average of medial and lateral   annulus peak E velocity: 23.71. - Aortic valve: Trileaflet; normal thickness, mildly calcified   leaflets. -  Mitral valve: Moderately calcified annulus. Mildly thickened,   mildly calcified leaflets anterior. There was mild regurgitation. - Left atrium: The atrium was moderately dilated.   Anterior-posterior dimension: 38 mm. Volume/bsa, ES, (1-plane   Simpson&'s, A2C): 58.1 ml/m^2. - Right ventricle: The cavity size was moderately dilated. Wall   thickness was normal. Systolic function was mildly reduced. - Right atrium: The atrium was moderately to severely dilated. - Tricuspid valve: There was moderate regurgitation. - Pulmonary arteries: PA peak pressure: 52 mm Hg (S).  Impressions:  - The right ventricular systolic pressure was increased consistent   with moderate pulmonary hypertension.  Patient Profile     71 y.o. female with PMH of CAD s/p stenting of LAD in 06, RCA (07) with Cypher stents, DM, HTN, HL, systolic HF, and ERSD on HD who presented with ongoing chest pain and exertional dyspnea.   Assessment & Plan    1. Unstable Angina: Known CAD with last ischemic evaluation in 2007 with stenting to the RCA. Prior stenting of LAD in 2006.  Symptoms concerning for ACS. Mild troponin elevation. Currently pain free. Given reported symptoms and LV dysfunction on Echo, will plan for [facility] tomorrow.  -- The patient understands that risks included but are not limited to stroke (1 in 1000), death (1 in 1000), kidney failure [usually temporary] (1 in 500), bleeding (1 in 200), allergic reaction [possibly serious] (1 in 200).   2. ESRD on HD: Nephrology following. Last HD Sat. Will plan for HD post cath.   3. Chronic Systolic HF: Does not appear to be significantly volume overloaded. Management per renal. EF looks stable compared with 2012. 35-40%>> 40-45%.   4. HL: on statin -- check lipids  5. Hx of DM: states this is now diet controlled   Signed, Freddie Dymek [place], MD  09/15/2016, 8:53 AM

## 2016-09-15 NOTE — Progress Notes (Signed)
Patient ID: [name], female   DOB: 18-Mar-1946, 71 y.o.   MRN: [id number]   [facility] Progress Note   Assessment/ Plan:   1. Unstable angina: With a known history of coronary artery disease status post stenting of the RCA (2007). Given concerns of high risk chest pain-coronary angiogram and for today. 2. ESRD: Usually on Monday/Wednesday/Friday hemodialysis schedule and underwent hemodialysis on Saturday (4/21) in anticipation for coronary angiogram today. Will time hemodialysis after coronary angiography later today. 3. Anemia: Hemoglobin levels at acceptable range-continue to monitor for ESA use (s/p Mircera on 4/9). 4. CKD-MBD: Resume binders post-procedure when able to take oral intake. On calcium carbonate/Fosrenol for phosphorus binding and Hectorol for PTH suppression. 5. Hypertension: Blood pressures appear to be fairly controlled, continue to monitor post angiography and with ultrafiltration at hemodialysis.  Subjective:   Reports an uneventful night without significant chest pain. Comfortable this morning.    Objective:   BP 131/60 (BP Location: Left Arm)   Pulse 70   Temp 98.3 F (36.8 C) (Oral)   Resp 18   Wt 81.2 kg (179 lb)   SpO2 94%   BMI 27.22 kg/m   Physical Exam: WUJ:[id number] resting in bed CVS: Pulse regular rhythm, heart sounds S1 and S2 normal Resp: Clear to auscultation, no rales/rhonchi Abd: Soft, flat, nontender Ext: Status post left below-knee amputation, trace right lower extremity  Labs: BMET  Recent Labs Lab 09/12/16 0826 09/13/16 1210 09/14/16 1809  NA 134* 132* 131*  K 5.4* 3.5 3.7  CL 90* 91* 91*  CO2 GLUCOSE 71 87 80  BUN 25* 37* 25*  CREATININE 4.70* 5.76* 4.58*  CALCIUM 8.3* 8.1* 8.1*  PHOS  --  6.5*  --    CBC  Recent Labs Lab 09/12/16 0740 09/13/16 1210 09/14/16 1809  WBC 7.6 5.1 5.9  NEUTROABS 5.7  --   --   HGB 11.8* 11.5* 12.8  HCT 38.1 36.9 40.8  MCV 97.2 95.8 97.1  PLT 149* 131*  169   Medications:    . calcium carbonate  3 tablet Oral TID  . clotrimazole   Topical BID  . docusate sodium  100 mg Oral BID  . doxercalciferol  2 mcg Intravenous Q M,W,F-HD  . escitalopram  30 mg Oral Daily  . heparin  5,000 Units Subcutaneous Q8H  . irbesartan  150 mg Oral Daily  . lanthanum  1,000 mg Oral TID WC  . levothyroxine  100 mcg Oral QAC breakfast  . metoprolol succinate  25 mg Oral BID  . multivitamin  1 tablet Oral QHS  . nortriptyline  25 mg Oral QHS  . pravastatin  20 mg Oral q1800  . senna-docusate  2 tablet Oral BID  . sodium chloride flush  3 mL Intravenous Q12H   Zetta Bills, MD 09/15/2016, 8:05 AM

## 2016-09-15 NOTE — Progress Notes (Addendum)
Site area: RFA Site Prior to Removal:  Level 0 Pressure Applied For:25 min Manual:   yes Patient Status During Pull: stable  Post Pull Site:  Level Post Pull Instructions Given:   Post Pull Pulses Present: palpable Dressing Applied:  tegaderm Bedrest begins @ 1615 till 2015 Comments:

## 2016-09-15 NOTE — Progress Notes (Signed)
HYPOGLYCEMIC EVENT  CBG: 50  TREATMENT: 25cc D50  SYMPTOMS:none  FOLLOW-UP CBG:    Time:  1625 CBG Result: 75  POSSIBLE REASONS FOR EVENT:           npo

## 2016-09-16 ENCOUNTER — Encounter (HOSPITAL_COMMUNITY): Payer: Self-pay | Admitting: Cardiovascular Disease

## 2016-09-16 LAB — GLUCOSE, CAPILLARY
GLUCOSE-CAPILLARY: 97 mg/dL (ref 65–99)
GLUCOSE-CAPILLARY: 112 mg/dL — AB (ref 65–99)

## 2016-09-16 LAB — BASIC METABOLIC PANEL
Anion gap: 16 — ABNORMAL HIGH (ref 5–15)
BUN: 20 mg/dL (ref 6–20)
CHLORIDE: 96 mmol/L — AB (ref 101–111)
CO2: 24 mmol/L (ref 22–32)
CREATININE: 3.75 mg/dL — AB (ref 0.44–1.00)
Calcium: 8.1 mg/dL — ABNORMAL LOW (ref 8.9–10.3)
GFR calc Af Amer: 13 mL/min — ABNORMAL LOW (ref >60)
GFR calc non Af Amer: 11 mL/min — ABNORMAL LOW (ref >60)
GLUCOSE: 91 mg/dL (ref 65–99)
Potassium: 3.7 mmol/L (ref 3.5–5.1)
SODIUM: 136 mmol/L (ref 135–145)

## 2016-09-16 MED ORDER — DIPHENHYDRAMINE HCL 25 MG PO CAPS
25.0000 mg | ORAL_CAPSULE | Freq: Four times a day (QID) | ORAL | Status: DC | PRN
  Administered 2016-09-16 – 2016-09-17 (×3): 25 mg via ORAL
  Filled 2016-09-16 (×3): qty 1

## 2016-09-16 MED ORDER — OXYCODONE HCL 5 MG PO TABS
5.0000 mg | ORAL_TABLET | Freq: Two times a day (BID) | ORAL | Status: DC | PRN
  Administered 2016-09-16 – 2016-09-17 (×2): 5 mg via ORAL
  Filled 2016-09-16 (×2): qty 1

## 2016-09-16 MED ORDER — METOPROLOL SUCCINATE ER 50 MG PO TB24
50.0000 mg | ORAL_TABLET | Freq: Two times a day (BID) | ORAL | Status: DC
  Administered 2016-09-16 – 2016-09-17 (×2): 50 mg via ORAL
  Filled 2016-09-16 (×2): qty 1

## 2016-09-16 MED ORDER — DIPHENHYDRAMINE HCL 12.5 MG/5ML PO ELIX: 12.5000 mg | ORAL_SOLUTION | Freq: Three times a day (TID) | ORAL | Status: DC | PRN

## 2016-09-16 MED ORDER — ISOSORBIDE MONONITRATE ER 30 MG PO TB24
30.0000 mg | ORAL_TABLET | Freq: Every day | ORAL | Status: DC
  Administered 2016-09-16 – 2016-09-17 (×2): 30 mg via ORAL
  Filled 2016-09-16 (×2): qty 1

## 2016-09-16 NOTE — Progress Notes (Signed)
Progress Note  Patient Name: [name] Date of Encounter: 09/16/2016  Primary Cardiologist: new  Subjective   Complains of itching all over. Denies chest pain but feels breathless when talking.   Inpatient Medications    Scheduled Meds: . calcium carbonate  3 tablet Oral TID  . clotrimazole   Topical BID  . docusate sodium  100 mg Oral BID  . doxercalciferol  2 mcg Intravenous Q M,W,F-HD  . escitalopram  30 mg Oral Daily  . irbesartan  150 mg Oral Daily  . isosorbide mononitrate  30 mg Oral Daily  . lanthanum  1,000 mg Oral TID WC  . levothyroxine  100 mcg Oral QAC breakfast  . metoprolol succinate  50 mg Oral BID  . multivitamin  1 tablet Oral QHS  . nortriptyline  25 mg Oral QHS  . pravastatin  20 mg Oral q1800  . senna-docusate  2 tablet Oral BID  . sodium chloride flush  3 mL Intravenous Q12H   Continuous Infusions: . sodium chloride     PRN Meds: sodium chloride, acetaminophen, ALPRAZolam, aspirin, clonazePAM, diphenhydrAMINE, loperamide, ondansetron (ZOFRAN) IV, oxycodone, sodium chloride flush   Vital Signs    Vitals:   09/15/16 2255 09/15/16 2322 09/16/16 0448 09/16/16 0936  BP: (!) 142/83 137/64 (!) 132/97 138/90  Pulse: 70 72 63 64  Resp: Temp: 98 F (36.7 C) 97.8 F (36.6 C) 97.9 F (36.6 C)   TempSrc: Oral Oral Oral   SpO2: 94% 100% 96%   Weight: 173 lb 8 oz (78.7 kg)  183 lb 10.3 oz (83.3 kg)   Height:  (1.651 m)       Intake/Output Summary (Last 24 hours) at 09/16/16 1213 Last data filed at 09/16/16 0800  Gross per 24 hour  Intake              240 ml  Output              755 ml  Net             -515 ml   Filed Weights   09/15/16 1945 09/15/16 2255 09/16/16 0448  Weight: 175 lb 11.3 oz (79.7 kg) 173 lb 8 oz (78.7 kg) 183 lb 10.3 oz (83.3 kg)    Telemetry    NSR - Personally Reviewed  ECG    NSR nonspecific IVCD - Personally Reviewed  Physical Exam   GEN: No acute distress. overweight.  Neck: No  JVD Cardiac: RRR, no murmurs, rubs, or gallops.  Respiratory: Clear to auscultation bilaterally. GI: Soft, nontender, non-distended  MS: No edema; left BKA. AV fistula on right arm. Right groin without hematoma. No rash. Neuro:  Nonfocal  Psych: Normal affect   Labs    Chemistry  Recent Labs Lab 09/12/16 0826 09/13/16 1210 09/14/16 1809 09/15/16 1957 09/16/16 0150  NA 134* 132* 131* 130* 136  K 5.4* 3.5 3.7 3.5 3.7  CL 90* 91* 91* 93* 96*  CO2 GLUCOSE 71 87 80 123* 91  BUN 25* 37* 25* 38* 20  CREATININE 4.70* 5.76* 4.58* 5.41* 3.75*  CALCIUM 8.3* 8.1* 8.1* 7.9* 8.1*  PROT 6.0*  --  5.7*  --   --   ALBUMIN 3.2* 2.9* 3.0*  --   --   AST 90*  --  131*  --   --   ALT 172*  --  143*  --   --   ALKPHOS 116  --  154*  --   --   BILITOT 2.2*  --  1.8*  --   --   GFRNONAA 9* 7* 9* 7* 11*  GFRAA 10* 8* 10* 9* 13*  ANIONGAP 20* 18* 17* 14 16*     Hematology  Recent Labs Lab 09/13/16 1210 09/14/16 1809 09/15/16 1957  WBC 5.1 5.9 6.1  RBC 3.85* 4.20 3.87  HGB 11.5* 12.8 11.9*  HCT 36.9 40.8 37.5  MCV 95.8 97.1 96.9  MCH 29.9 30.5 30.7  MCHC 31.2 31.4 31.7  RDW 20.7* 21.4* 21.9*  PLT 131* 169 184    Cardiac Enzymes  Recent Labs Lab 09/12/16 2026  TROPONINI 0.23*     Recent Labs Lab 09/12/16 0744 09/12/16 1211  TROPIPOC 0.30* 0.25*     BNPNo results for input(s): BNP, PROBNP in the last 168 hours.   DDimer No results for input(s): DDIMER in the last 168 hours.   Radiology    No results found.  Cardiac Studies   Echo: 09/14/16: Study Conclusions  - Left ventricle: The cavity size was normal. There was moderate   focal basal hypertrophy. Systolic function was mildly to   moderately reduced. The estimated ejection fraction was in the   range of 40% to 45%. Moderate diffuse hypokinesis. Features are   consistent with a pseudonormal left ventricular filling pattern,   with concomitant abnormal relaxation and increased filling    pressure (grade 2 diastolic dysfunction). Doppler parameters are   consistent with high ventricular filling pressure. Ratio of   mitral valve peak E velocity to average of medial and lateral   annulus peak E velocity: 23.71. - Aortic valve: Trileaflet; normal thickness, mildly calcified   leaflets. - Mitral valve: Moderately calcified annulus. Mildly thickened,   mildly calcified leaflets anterior. There was mild regurgitation. - Left atrium: The atrium was moderately dilated.   Anterior-posterior dimension: 38 mm. Volume/bsa, ES, (1-plane   Simpson&'s, A2C): 58.1 ml/m^2. - Right ventricle: The cavity size was moderately dilated. Wall   thickness was normal. Systolic function was mildly reduced. - Right atrium: The atrium was moderately to severely dilated. - Tricuspid valve: There was moderate regurgitation. - Pulmonary arteries: PA peak pressure: 52 mm Hg (S).  Impressions:  - The right ventricular systolic pressure was increased consistent   with moderate pulmonary hypertension.  Procedures   Left Heart Cath and Coronary Angiography  Conclusion     Prox RCA to Mid RCA lesion, 0 %stenosed.  Prox RCA lesion, 60 %stenosed.  Mid RCA lesion, 100 %stenosed.  Prox Cx to Mid Cx lesion, 50 %stenosed.  Ost 2nd Mrg to 2nd Mrg lesion, 30 %stenosed.  Ost LAD to Prox LAD lesion, 20 %stenosed.  Mid LAD lesion, 40 %stenosed.  Mid LAD to Dist LAD lesion, 20 %stenosed.  Dist LAD lesion, 20 %stenosed.  Ost 2nd Diag lesion, 70 %stenosed.   1. Patent stents LAD with mild stent restenosis. No flow limiting lesions in the LAD 2. Moderate non-obstructive disease in the Circumflex 3. Chronic total occlusion mid RCA with filling of the mid and distal RCA by left to right collaterals.  4. Normal filling pressures  Recommendations: Continue medical management of CAD      Patient Profile     71 y.o. female with PMH of CAD s/p stenting of LAD in 06, RCA (07) with Cypher stents,  DM, HTN, HL, systolic HF, and ERSD on HD who presented with ongoing chest pain and exertional dyspnea.   Assessment & Plan  1. Unstable Angina: Known CAD with last ischemic evaluation in 2007 with stenting to the RCA. Prior stenting of LAD in 2006.  Symptoms concerning for ACS. Mild troponin elevation. Currently pain free.  Cardiac cath showed occlusion of the mid RCA in old stent. She has good left to right collaterals. Otherwise nonobstructive disease. Filling pressures were good.  Will increase Toprol XL to 50 mg bid. Add imdur 30 mg daily. Continue ASA.  If patient has refractory severe angina she could be considered for CTO PCI of the RCA.  2. ESRD on HD: Nephrology following.   3. Chronic Systolic HF: Does not appear to be significantly volume overloaded. Management per renal. EF looks stable compared with 2012. 35-40%>> 40-45%.   4. HL: on pravastatin- needs updated lipid panel.  5. Hx of DM: states this is now diet controlled  6. Pruritis. No clear rash. Will treat with Benadryl.   Signed, Peter [place], MD  09/16/2016, 12:13 PM

## 2016-09-16 NOTE — Clinical Social Work Note (Signed)
Clinical Social Work Assessment  Patient Details  Name: [name] MRN: [id number] Date of Birth: Jun 17, 1945  Date of referral:  09/16/16               Reason for consult:  Discharge Planning                Permission sought to share information with:  Family Supports Permission granted to share information::  Yes, Verbal Permission Granted  Name::        Agency::     Relationship::     Contact Information:     Housing/Transportation Living arrangements for the past 2 months:  Apartment Source of Information:  Patient Patient Interpreter Needed:  None Criminal Activity/Legal Involvement Pertinent to Current Situation/Hospitalization:  No - Comment as needed Significant Relationships:  Other Family Members Lives with:  Self Do you feel safe going back to the place where you live?  Yes Need for family participation in patient care:  No (Coment)  Care giving concerns:  Patient lives alone  Facilities manager / plan:  CSW met patient at bedside to offer support and discuss discharge options. Patient stated she was recently at a SNF 3 weeks ago and does not mind going back. Patient stated she was at [facility] and would like to go back to facility. CSW to complete necessary paperwork and initiate  SNF search on patients behalf. CSW to follow up with patient once bed offers are available. Patient has [name] following her.  Employment status:  Retired Forensic scientist:  Medicare PT Recommendations:  [place] / Referral to community resources:  [place]  Patient/Family's Response to care: Patient verbalized appreciation and understanding for CSW role and involvement in care. Patient agreeable with current discharge plant to SNF.  Patient/Family's Understanding of and Emotional Response to Diagnosis, Current Treatment, and Prognosis:  Patient with good understanding of current medical state and limitations around most  recent hospitalization. Patient agreeable with SNF placement in hopes of going back home.  Emotional Assessment Appearance:  Appears stated age Attitude/Demeanor/Rapport:  Other Affect (typically observed):  Pleasant Orientation:  Oriented to Situation, Oriented to  Time, Oriented to Place, Oriented to Self Alcohol / Substance use:  Not Applicable Psych involvement (Current and /or in the community):  No (Comment)  Discharge Needs  Concerns to be addressed:  No discharge needs identified Readmission within the last 30 days:  No Current discharge risk:  None Barriers to Discharge:  No Barriers Identified   Wende Neighbors, LCSW 09/16/2016, 3:11 PM

## 2016-09-16 NOTE — Progress Notes (Signed)
Patient ID: [name], female   DOB: December 04, 1945, 71 y.o.   MRN: [id number]  [facility] Progress Note   Assessment/ Plan:   1. Unstable angina: With a known history of coronary artery disease status post stenting of the RCA (2007). Underwent coronary angiography yesterday and reports that she remains chest pain-free at this time. 2. ESRD: Continue hemodialysis on Mondays/Wednesday/Friday schedule with next hemodialysis due tomorrow-orders placed. She is euvolemic at this time without any acute indications for dialysis. 3. Anemia: Hemoglobin levels at acceptable range-continue to monitor for ESA use (s/p Mircera on 4/9). 4. CKD-MBD: Restarted calcium carbonate/Fosrenol for phosphorus binding and continue Hectorol for PTH suppression. 5. Hypertension: Blood pressures appear to be fairly controlled, monitor with ultrafiltration at hemodialysis. 6. Deconditioning: Awaiting placement to a skilled nursing facility for ongoing physical therapy/rehabilitative efforts  Subjective:   Reports an uneventful night without significant chest pain. Comfortable this morning.    Objective:   BP (!) 132/97 (BP Location: Left Arm)   Pulse 63   Temp 97.9 F (36.6 C) (Oral)   Resp 18   Ht  (1.651 m)   Wt 83.3 kg (183 lb 10.3 oz)   SpO2 96%   BMI 30.56 kg/m   Physical Exam: YQM:VHQIONGEXBM resting in bed CVS: Pulse regular rhythm, heart sounds S1 and S2 normal Resp: Clear to auscultation, no rales/rhonchi Abd: Soft, flat, nontender Ext: Status post left below-knee amputation, no right lower extremity edema, RUA BBF  Labs: BMET  Recent Labs Lab 09/12/16 0826 09/13/16 1210 09/14/16 1809 09/15/16 1957 09/16/16 0150  NA 134* 132* 131* 130* 136  K 5.4* 3.5 3.7 3.5 3.7  CL 90* 91* 91* 93* 96*  CO2 GLUCOSE 71 87 80 123* 91  BUN 25* 37* 25* 38* 20  CREATININE 4.70* 5.76* 4.58* 5.41* 3.75*  CALCIUM 8.3* 8.1* 8.1* 7.9* 8.1*  PHOS  --  6.5*  --   --   --     CBC  Recent Labs Lab 09/12/16 0740 09/13/16 1210 09/14/16 1809 09/15/16 1957  WBC 7.6 5.1 5.9 6.1  NEUTROABS 5.7  --   --   --   HGB 11.8* 11.5* 12.8 11.9*  HCT 38.1 36.9 40.8 37.5  MCV 97.2 95.8 97.1 96.9  PLT 149* 131* 169 184   Medications:    . calcium carbonate  3 tablet Oral TID  . clotrimazole   Topical BID  . docusate sodium  100 mg Oral BID  . doxercalciferol  2 mcg Intravenous Q M,W,F-HD  . escitalopram  30 mg Oral Daily  . irbesartan  150 mg Oral Daily  . lanthanum  1,000 mg Oral TID WC  . levothyroxine  100 mcg Oral QAC breakfast  . metoprolol succinate  25 mg Oral BID  . multivitamin  1 tablet Oral QHS  . nortriptyline  25 mg Oral QHS  . pravastatin  20 mg Oral q1800  . senna-docusate  2 tablet Oral BID  . sodium chloride flush  3 mL Intravenous Q12H   Zetta Bills, MD 09/16/2016, 8:26 AM

## 2016-09-16 NOTE — Progress Notes (Signed)
PROGRESS NOTE                                                                                                                                                                                                             Patient Demographics:    [name], is a 71 y.o. female, DOB - [birth year], ZOX:[id number]  Admit date - 09/12/2016   Admitting Physician Levie Heritage, DO  Outpatient Primary MD for the patient is Thane Edu, MD  LOS - 2  Outpatient Specialists:Nephrology  Chief Complaint  Patient presents with  . Hypertension       Brief Narrative   71 year old female with history of moderate to severe systolic CHF (EF 81-19%), coronary artery disease with RCA stenting in 2007, diet-controlled diabetes mellitus, ESRD on hemodialysis (M, W, F), peripheral arterial disease status post left BKA, recent left femur fracture and left hip arthroplasty, resident of SNF who developed sudden onset of dizziness associated with dyspnea and chest pain during hemodialysis. She was also found to be in hypertensive crisis with systolic blood pressure >200 and was sent to the ED via EMS.  In the ED she was hypotensive with BP of 182/105 mmHg, tachypneic, afebrile. Blood work showed K of 5.4, creatinine 4.7 with mildly elevated troponin (0.3, 0.25) she was noted to have EKG without acute changes. Patient admitted to hospitalist service for hypertensive crisis and chest pain symptoms. She was scheduled for stress test but given her progressive chest pain symptoms for past 3 weeks worsened with exertion was concerning for unstable angina and cardiology decided with cardiac cath.   Subjective:   Denies any chest pain this morning.   Assessment  & Plan :   Principal problem Unstable angina Continue aspirin, statin and beta blocker. No further chest pain symptoms.  LHC on 4/23 shows: 1. Patent stents LAD with mild stent  restenosis. No flow limiting lesions in the LAD 2. Moderate non-obstructive disease in the Circumflex 3. Chronic total occlusion mid RCA with filling of the mid and distal RCA by left to right collaterals.  4. Normal filling pressures  Cardiology recommend optimal medical management. If further anginal symptoms considering PCI of the  RCA. Increased toprol dose. Added imdur.    Acute exacerbation of chronic systolic CHF. 2-D echo showed moderately reduced EF of 40-45% with diffuse hypokinesis and grade  2 diastolic dysfunction. Also had moderate pulmonary hypertension. Managed with hemodialysis. Continue aspirin, beta blocker and ARB.  Hypertensive urgency Likely contributed by unstable angina involving overload. Improved after dialysis.   ESRD on HD Getting scheduled dialysis here. Nephrology following. Received  hemodialysis after cardiac cath   Anemia of chronic kidney disease Stable.  Chronic depression Continue Lexapro and amitriptyline.  Hyperlipidemia Continue statin  generalized weakness  PT eval. Pt reports she doesnot have enough help at home. May need SNF.  Code Status : DO NOT RESUSCITATE  Family Communication  : None at bedside  Disposition Plan : Home vs SNF   Barriers For Discharge : pending PT eval  Consults  :   Cardiology Renal  Procedures  :  Hemodialysis Echo LHC  DVT Prophylaxis  :  Heparin  Lab Results  Component Value Date   PLT 184 09/15/2016    Antibiotics  :    Anti-infectives    None        Objective:   Vitals:   09/15/16 2322 09/16/16 0448 09/16/16 0936 09/16/16 1314  BP: 137/64 (!) 132/97 138/90 (!) 156/90  Pulse: 72 63 64 66  Resp: Temp: 97.8 F (36.6 C) 97.9 F (36.6 C)  97.9 F (36.6 C)  TempSrc: Oral Oral  Oral  SpO2: 100% 96%  97%  Weight:  83.3 kg (183 lb 10.3 oz)    Height:        Wt Readings from Last 3 Encounters:  09/16/16 83.3 kg (183 lb 10.3 oz)  08/07/16 81.2 kg (179 lb)  07/03/16  81.6 kg (179 lb 14.4 oz)     Intake/Output Summary (Last 24 hours) at 09/16/16 1509 Last data filed at 09/16/16 1300  Gross per 24 hour  Intake              360 ml  Output              756 ml  Net             -396 ml     Physical Exam  Gen:  not in distress HEENT: moist mucosa, supple neck Chest: Clear to auscultation bilaterally CVS: N S1&S2, no murmurs,  GI: soft, NT, ND,  Musculoskeletal: warm, no edema, left BKA     Data Review:    CBC  Recent Labs Lab 09/12/16 0740 09/13/16 1210 09/14/16 1809 09/15/16 1957  WBC 7.6 5.1 5.9 6.1  HGB 11.8* 11.5* 12.8 11.9*  HCT 38.1 36.9 40.8 37.5  PLT 149* 131* 169 184  MCV 97.2 95.8 97.1 96.9  MCH 30.1 29.9 30.5 30.7  MCHC 31.0 31.2 31.4 31.7  RDW 20.7* 20.7* 21.4* 21.9*  LYMPHSABS 1.2  --   --   --   MONOABS 0.5  --   --   --   EOSABS 0.2  --   --   --   BASOSABS 0.0  --   --   --     Chemistries   Recent Labs Lab 09/12/16 0826 09/13/16 1210 09/14/16 1809 09/15/16 1957 09/16/16 0150  NA 134* 132* 131* 130* 136  K 5.4* 3.5 3.7 3.5 3.7  CL 90* 91* 91* 93* 96*  CO2 GLUCOSE 71 87 80 123* 91  BUN 25* 37* 25* 38* 20  CREATININE 4.70* 5.76* 4.58* 5.41* 3.75*  CALCIUM 8.3* 8.1* 8.1* 7.9* 8.1*  AST 90*  --  131*  --   --  ALT 172*  --  143*  --   --   ALKPHOS 116  --  154*  --   --   BILITOT 2.2*  --  1.8*  --   --    ------------------------------------------------------------------------------------------------------------------ No results for input(s): CHOL, HDL, LDLCALC, TRIG, CHOLHDL, LDLDIRECT in the last 72 hours.  Lab Results  Component Value Date   HGBA1C (H) 02/12/2010    6.3 (NOTE)                                                                       According to the ADA Clinical Practice Recommendations for 2011, when HbA1c is used as a screening test:   >=6.5%   Diagnostic of Diabetes Mellitus           (if abnormal result  is confirmed)  5.7-6.4%   Increased risk of developing  Diabetes Mellitus  References:Diagnosis and Classification of Diabetes Mellitus,Diabetes Care,2011,34(Suppl 1):S62-S69 and Standards of Medical Care in         Diabetes - 2011,Diabetes Care,2011,34  (Suppl 1):S11-S61.   ------------------------------------------------------------------------------------------------------------------ No results for input(s): TSH, T4TOTAL, T3FREE, THYROIDAB in the last 72 hours.  Invalid input(s): FREET3 ------------------------------------------------------------------------------------------------------------------ No results for input(s): VITAMINB12, FOLATE, FERRITIN, TIBC, IRON, RETICCTPCT in the last 72 hours.  Coagulation profile  Recent Labs Lab 09/15/16 0237  INR 1.41    No results for input(s): DDIMER in the last 72 hours.  Cardiac Enzymes  Recent Labs Lab 09/12/16 2026  TROPONINI 0.23*   ------------------------------------------------------------------------------------------------------------------ No results found for: BNP  Inpatient Medications  Scheduled Meds: . calcium carbonate  3 tablet Oral TID  . clotrimazole   Topical BID  . docusate sodium  100 mg Oral BID  . doxercalciferol  2 mcg Intravenous Q M,W,F-HD  . escitalopram  30 mg Oral Daily  . irbesartan  150 mg Oral Daily  . isosorbide mononitrate  30 mg Oral Daily  . lanthanum  1,000 mg Oral TID WC  . levothyroxine  100 mcg Oral QAC breakfast  . metoprolol succinate  50 mg Oral BID  . multivitamin  1 tablet Oral QHS  . nortriptyline  25 mg Oral QHS  . pravastatin  20 mg Oral q1800  . senna-docusate  2 tablet Oral BID  . sodium chloride flush  3 mL Intravenous Q12H   Continuous Infusions: . sodium chloride     PRN Meds:.sodium chloride, acetaminophen, ALPRAZolam, aspirin, clonazePAM, diphenhydrAMINE, loperamide, ondansetron (ZOFRAN) IV, oxyCODONE, sodium chloride flush  Micro Results Recent Results (from the past 240 hour(s))  MRSA PCR Screening     Status:  None   Collection Time: 09/15/16  1:20 PM  Result Value Ref Range Status   MRSA by PCR NEGATIVE NEGATIVE Final    Comment:        The GeneXpert MRSA Assay (FDA approved for NASAL specimens only), is one component of a comprehensive MRSA colonization surveillance program. It is not intended to diagnose MRSA infection nor to guide or monitor treatment for MRSA infections.     Radiology Reports Dg Chest 2 View  Result Date: 09/12/2016 CLINICAL DATA:  Dizziness and weakness EXAM: CHEST  2 VIEW COMPARISON:  08/09/2012 FINDINGS: Low volume chest with vascular congestion and prominent hila. Cardiomegaly. No effusion or  pneumothorax. No air bronchogram. IMPRESSION: 1. Cardiomegaly and vascular congestion. 2. Fullness of the hila, likely accentuated by low volumes and #1. Hilar adenopathy is a possibility and two-view chest x-ray is recommended after convalescence. Electronically Signed   By: Marnee Spring M.D.   On: 09/12/2016 08:07    Time Spent in minutes  25   Eddie North M.D on 09/16/2016 at 3:09 PM  Between 7am to 7pm - Pager - [phone]  After 7pm go to [email or web address] - password [facility]  [facility] -  Office  [phone]

## 2016-09-17 DIAGNOSIS — I160 Hypertensive urgency: Secondary | ICD-10-CM

## 2016-09-17 DIAGNOSIS — D500 Iron deficiency anemia secondary to blood loss (chronic): Secondary | ICD-10-CM

## 2016-09-17 LAB — CBC
HEMATOCRIT: 34.5 % — AB (ref 36.0–46.0)
Hemoglobin: 11.1 g/dL — ABNORMAL LOW (ref 12.0–15.0)
MCH: 31.8 pg (ref 26.0–34.0)
MCHC: 32.2 g/dL (ref 30.0–36.0)
MCV: 98.9 fL (ref 78.0–100.0)
PLATELETS: 150 10*3/uL (ref 150–400)
RBC: 3.49 MIL/uL — ABNORMAL LOW (ref 3.87–5.11)
RDW: 22.5 % — AB (ref 11.5–15.5)
WBC: 6.4 10*3/uL (ref 4.0–10.5)

## 2016-09-17 MED ORDER — ISOSORBIDE MONONITRATE ER 30 MG PO TB24
30.0000 mg | ORAL_TABLET | Freq: Every day | ORAL | 1 refills | Status: AC
Start: 2016-09-17 — End: ?

## 2016-09-17 MED ORDER — ALPRAZOLAM 0.25 MG PO TABS: 0.2500 mg | ORAL_TABLET | Freq: Three times a day (TID) | ORAL | 0 refills | Status: DC | PRN

## 2016-09-17 MED ORDER — HEPARIN SODIUM (PORCINE) 1000 UNIT/ML DIALYSIS: 40.0000 [IU]/kg | INTRAMUSCULAR | Status: DC | PRN

## 2016-09-17 MED ORDER — DOXERCALCIFEROL 4 MCG/2ML IV SOLN
INTRAVENOUS | Status: AC
  Administered 2016-09-17: 2 ug via INTRAVENOUS
  Filled 2016-09-17: qty 2

## 2016-09-17 MED ORDER — METOPROLOL SUCCINATE ER 50 MG PO TB24: 50.0000 mg | ORAL_TABLET | Freq: Two times a day (BID) | ORAL | 1 refills | Status: AC

## 2016-09-17 NOTE — Discharge Summary (Signed)
Physician Discharge Summary  [name] MRN: [id number] DOB/AGE: 1946-05-19 71 y.o.  PCP: Sherian Maroon, MD   Admit date: 09/12/2016 Discharge date: 09/17/2016  Discharge Diagnoses:    Principal Problem:   Chest pain Active Problems:   ESRD (end stage renal disease) (HCC)   GERD (gastroesophageal reflux disease)   CAD, multiple vessel   CHF (congestive heart failure) (HCC)   Hypertension   Hypothyroid   Anemia   Hypertensive urgency   Dyspnea   Unstable angina (HCC)   Non-ST elevation (NSTEMI) myocardial infarction [facility])    Follow-up recommendations Follow-up with PCP in 3-5 days , including all  additional recommended appointments as below Follow-up CBC, CMP in 3-5 days  Continue hemodialysis on Mondays/Wednesday/Friday. Hemodialysis 4/25 prior to discharge.     Current Discharge Medication List    START taking these medications   Details  isosorbide mononitrate (IMDUR) 30 MG 24 hr tablet Take 1 tablet (30 mg total) by mouth daily. Qty: 30 tablet, Refills: 1      CONTINUE these medications which have CHANGED   Details  ALPRAZolam (XANAX) 0.25 MG tablet Take 1 tablet (0.25 mg total) by mouth 3 (three) times daily as needed for anxiety. Qty: 10 tablet, Refills: 0    metoprolol succinate (TOPROL-XL) 50 MG 24 hr tablet Take 1 tablet (50 mg total) by mouth 2 (two) times daily. Take with or immediately following a meal. Qty: 60 tablet, Refills: 1      CONTINUE these medications which have NOT CHANGED   Details  acetaminophen (TYLENOL) 325 MG tablet Take 650 mg by mouth every 6 (six) hours as needed for mild pain.    aspirin 81 MG chewable tablet Chew 81 mg by mouth every 6 (six) hours as needed for mild pain.     calcium carbonate (TUMS - DOSED IN MG ELEMENTAL CALCIUM) 500 MG chewable tablet Chew 3 tablets by mouth 3 (three) times daily.     docusate sodium (COLACE) 100 MG capsule Take 100 mg by mouth 2 (two) times daily.    escitalopram  (LEXAPRO) 20 MG tablet Take 30 mg by mouth daily.     hydrOXYzine (ATARAX/VISTARIL) 25 MG tablet Take 25 mg by mouth every 6 (six) hours as needed for itching.     lanthanum (FOSRENOL) 500 MG chewable tablet Chew 1 tablet (500 mg total) by mouth 3 (three) times daily with meals. Qty: 30 tablet, Refills: 3    levothyroxine (SYNTHROID, LEVOTHROID) 100 MCG tablet Take 100 mcg by mouth daily.     loperamide (IMODIUM) 2 MG capsule Take 2 mg by mouth 4 (four) times daily as needed for diarrhea or loose stools.     lovastatin (MEVACOR) 20 MG tablet Take 20 mg by mouth at bedtime.     nortriptyline (PAMELOR) 25 MG capsule Take 25 mg by mouth at bedtime.    oxycodone (OXY-IR) 5 MG capsule Take 5 mg by mouth every 12 (twelve) hours as needed for pain.    senna-docusate (SENOKOT-S) 8.6-50 MG tablet Take 2 tablets by mouth 2 (two) times daily.    valsartan (DIOVAN) 80 MG tablet Take 80 mg by mouth daily.      STOP taking these medications     clonazePAM (KLONOPIN) 1 MG tablet      promethazine (PHENERGAN) 25 MG tablet          Discharge Condition: Stable   Discharge Instructions Get Medicines reviewed and adjusted: Please take all your medications with you for your next visit  with your Primary MD  Please request your Primary MD to go over all hospital tests and procedure/radiological results at the follow up, please ask your Primary MD to get all Hospital records sent to his/her office.  If you experience worsening of your admission symptoms, develop shortness of breath, life threatening emergency, suicidal or homicidal thoughts you must seek medical attention immediately by calling 911 or calling your MD immediately if symptoms less severe.  You must read complete instructions/literature along with all the possible adverse reactions/side effects for all the Medicines you take and that have been prescribed to you. Take any new Medicines after you have completely understood and accpet  all the possible adverse reactions/side effects.   Do not drive when taking Pain medications.   Do not take more than prescribed Pain, Sleep and Anxiety Medications  Special Instructions: If you have smoked or chewed Tobacco in the last 2 yrs please stop smoking, stop any regular Alcohol and or any Recreational drug use.  Wear Seat belts while driving.  Please note  You were cared for by a hospitalist during your hospital stay. Once you are discharged, your primary care physician will handle any further medical issues. Please note that NO REFILLS for any discharge medications will be authorized once you are discharged, as it is imperative that you return to your primary care physician (or establish a relationship with a primary care physician if you do not have one) for your aftercare needs so that they can reassess your need for medications and monitor your lab values.     Allergies  Allergen Reactions  . Morphine And Related Other (See Comments)    Pt fell into deep sleep and could not be woken up had to be resuscitated says she did not overdose, unsure whether throat swelled or SOB pt does not remember  . Other Anaphylaxis    PEACHES  . Penicillins Swelling    Tongue swelling.   Marland Kitchen Pumpkin Seed Oil-Saw Palmetto-Zinc [Propalmex] Anaphylaxis  . Shrimp [Shellfish Allergy] Anaphylaxis      Disposition: 01-Home or Self Care   Consults:  Cardiology Nephrology*    Significant Diagnostic Studies:  Dg Chest 2 View  Result Date: 09/12/2016 CLINICAL DATA:  Dizziness and weakness EXAM: CHEST  2 VIEW COMPARISON:  08/09/2012 FINDINGS: Low volume chest with vascular congestion and prominent hila. Cardiomegaly. No effusion or pneumothorax. No air bronchogram. IMPRESSION: 1. Cardiomegaly and vascular congestion. 2. Fullness of the hila, likely accentuated by low volumes and #1. Hilar adenopathy is a possibility and two-view chest x-ray is recommended after convalescence. Electronically  Signed   By: Monte Fantasia M.D.   On: 09/12/2016 08:07    Echocardiogram   Cardiac-09/15/16 1. Patent stents LAD with mild stent restenosis. No flow limiting lesions in the LAD 2. Moderate non-obstructive disease in the Circumflex 3. Chronic total occlusion mid RCA with filling of the mid and distal RCA by left to right collaterals.  4. Normal filling pressures  Recommendations: Continue medical management of CAD     Filed Weights   09/15/16 2255 09/16/16 0448 09/17/16 0254  Weight: 78.7 kg (173 lb 8 oz) 83.3 kg (183 lb 10.3 oz) 81.4 kg (179 lb 7.3 oz)     Microbiology: Recent Results (from the past 240 hour(s))  MRSA PCR Screening     Status: None   Collection Time: 09/15/16  1:20 PM  Result Value Ref Range Status   MRSA by PCR NEGATIVE NEGATIVE Final    Comment:  The GeneXpert MRSA Assay (FDA approved for NASAL specimens only), is one component of a comprehensive MRSA colonization surveillance program. It is not intended to diagnose MRSA infection nor to guide or monitor treatment for MRSA infections.        Blood Culture    Component Value Date/Time   SDES DRAINAGE 06/08/2016 1631   SPECREQUEST DEEP SURGICAL WOUND 06/08/2016 1631   CULT No growth aerobically or anaerobically. 06/08/2016 1631   REPTSTATUS 06/13/2016 FINAL 06/08/2016 1631      Labs: Results for orders placed or performed during the hospital encounter of 09/12/16 (from the past 48 hour(s))  MRSA PCR Screening     Status: None   Collection Time: 09/15/16  1:20 PM  Result Value Ref Range   MRSA by PCR NEGATIVE NEGATIVE    Comment:        The GeneXpert MRSA Assay (FDA approved for NASAL specimens only), is one component of a comprehensive MRSA colonization surveillance program. It is not intended to diagnose MRSA infection nor to guide or monitor treatment for MRSA infections.   Glucose, capillary     Status: Abnormal   Collection Time: 09/15/16  3:30 PM  Result Value Ref  Range   Glucose-Capillary 50 (L) 65 - 99 mg/dL  Glucose, capillary     Status: None   Collection Time: 09/15/16  4:22 PM  Result Value Ref Range   Glucose-Capillary 75 65 - 99 mg/dL  CBC     Status: Abnormal   Collection Time: 09/15/16  7:57 PM  Result Value Ref Range   WBC 6.1 4.0 - 10.5 K/uL   RBC 3.87 3.87 - 5.11 MIL/uL   Hemoglobin 11.9 (L) 12.0 - 15.0 g/dL   HCT 37.5 36.0 - 46.0 %   MCV 96.9 78.0 - 100.0 fL   MCH 30.7 26.0 - 34.0 pg   MCHC 31.7 30.0 - 36.0 g/dL   RDW 21.9 (H) 11.5 - 15.5 %   Platelets 184 150 - 400 K/uL  Basic metabolic panel     Status: Abnormal   Collection Time: 09/15/16  7:57 PM  Result Value Ref Range   Sodium 130 (L) 135 - 145 mmol/L   Potassium 3.5 3.5 - 5.1 mmol/L   Chloride 93 (L) 101 - 111 mmol/L   CO2 23 22 - 32 mmol/L   Glucose, Bld 123 (H) 65 - 99 mg/dL   BUN 38 (H) 6 - 20 mg/dL   Creatinine, Ser 5.41 (H) 0.44 - 1.00 mg/dL   Calcium 7.9 (L) 8.9 - 10.3 mg/dL   GFR calc non Af Amer 7 (L) >60 mL/min   GFR calc Af Amer 9 (L) >60 mL/min    Comment: (NOTE) The eGFR has been calculated using the CKD EPI equation. This calculation has not been validated in all clinical situations. eGFR's persistently <60 mL/min signify possible Chronic Kidney Disease.    Anion gap 14 5 - 15  Hepatitis B surface antigen     Status: None   Collection Time: 09/15/16  7:57 PM  Result Value Ref Range   Hepatitis B Surface Ag Negative Negative    Comment: (NOTE) Performed At: [facility] [address] [place], [place] [id number] Lindon Romp MD WV:[id number]   Glucose, capillary     Status: Abnormal   Collection Time: 09/15/16 11:25 PM  Result Value Ref Range   Glucose-Capillary 60 (L) 65 - 99 mg/dL   Comment 1 Notify RN   Basic metabolic panel  Status: Abnormal   Collection Time: 09/16/16  1:50 AM  Result Value Ref Range   Sodium 136 135 - 145 mmol/L   Potassium 3.7 3.5 - 5.1 mmol/L   Chloride 96 (L) 101 - 111 mmol/L   CO2 24 22 - 32  mmol/L   Glucose, Bld 91 65 - 99 mg/dL   BUN 20 6 - 20 mg/dL   Creatinine, Ser 3.75 (H) 0.44 - 1.00 mg/dL   Calcium 8.1 (L) 8.9 - 10.3 mg/dL   GFR calc non Af Amer 11 (L) >60 mL/min   GFR calc Af Amer 13 (L) >60 mL/min    Comment: (NOTE) The eGFR has been calculated using the CKD EPI equation. This calculation has not been validated in all clinical situations. eGFR's persistently <60 mL/min signify possible Chronic Kidney Disease.    Anion gap 16 (H) 5 - 15  Glucose, capillary     Status: None   Collection Time: 09/16/16  6:48 AM  Result Value Ref Range   Glucose-Capillary 97 65 - 99 mg/dL  Glucose, capillary     Status: Abnormal   Collection Time: 09/16/16 11:17 AM  Result Value Ref Range   Glucose-Capillary 112 (H) 65 - 99 mg/dL   Comment 1 Notify RN      Lipid Panel     Component Value Date/Time   CHOL  02/12/2010 0644    144        ATP III CLASSIFICATION:  <200     mg/dL   Desirable  200-239  mg/dL   Borderline High  >=240    mg/dL   High          TRIG 128 02/12/2010 0644   HDL 42 02/12/2010 0644   CHOLHDL 3.4 02/12/2010 0644   VLDL 26 02/12/2010 0644   LDLCALC  02/12/2010 0644    76        Total Cholesterol/HDL:CHD Risk Coronary Heart Disease Risk Table                     Men   Women  1/2 Average Risk   3.4   3.3  Average Risk       5.0   4.4  2 X Average Risk   9.6   7.1  3 X Average Risk  23.4   11.0        Use the calculated Patient Ratio above and the CHD Risk Table to determine the patient's CHD Risk.        ATP III CLASSIFICATION (LDL):  <100     mg/dL   Optimal  100-129  mg/dL   Near or Above                    Optimal  130-159  mg/dL   Borderline  160-189  mg/dL   High  >190     mg/dL   Very High     Lab Results  Component Value Date   HGBA1C (H) 02/12/2010    6.3 (NOTE)                                                                       According to the ADA Clinical Practice Recommendations for 2011, when  HbA1c is used as a  screening test:   >=6.5%   Diagnostic of Diabetes Mellitus           (if abnormal result  is confirmed)  5.7-6.4%   Increased risk of developing Diabetes Mellitus  References:Diagnosis and Classification of Diabetes Mellitus,Diabetes DGUY,4034,74(QVZDG 1):S62-S69 and Standards of Medical Care in         Diabetes - 2011,Diabetes LOVF,6433,29  (Suppl 1):S11-S61.   HGBA1C  11/13/2009    5.5 (NOTE)                                                                       According to the ADA Clinical Practice Recommendations for 2011, when HbA1c is used as a screening test:   >=6.5%   Diagnostic of Diabetes Mellitus           (if abnormal result  is confirmed)  5.7-6.4%   Increased risk of developing Diabetes Mellitus  References:Diagnosis and Classification of Diabetes Mellitus,Diabetes JJOA,4166,06(TKZSW 1):S62-S69 and Standards of Medical Care in         Diabetes - 2011,Diabetes FUXN,2355,73  (Suppl 1):S11-S61.   HGBA1C (H) 09/05/2009    6.1 (NOTE)                                                                       According to the ADA Clinical Practice Recommendations for 2011, when HbA1c is used as a screening test:   >=6.5%   Diagnostic of Diabetes Mellitus           (if abnormal result  is confirmed)  5.7-6.4%   Increased risk of developing Diabetes Mellitus  References:Diagnosis and Classification of Diabetes Mellitus,Diabetes UKGU,5427,06(CBJSE 1):S62-S69 and Standards of Medical Care in         Diabetes - 2011,Diabetes GBTD,1761,60  (Suppl 1):S11-S61.     Lab Results  Component Value Date   [facility]  02/12/2010    76        Total Cholesterol/HDL:CHD Risk Coronary Heart Disease Risk Table                     Men   Women  1/2 Average Risk   3.4   3.3  Average Risk       5.0   4.4  2 X Average Risk   9.6   7.1  3 X Average Risk  23.4   11.0        Use the calculated Patient Ratio above and the CHD Risk Table to determine the patient's CHD Risk.        ATP III CLASSIFICATION (LDL):   <100     mg/dL   Optimal  100-129  mg/dL   Near or Above                    Optimal  130-159  mg/dL   Borderline  160-189  mg/dL   High  >190     mg/dL   Very High  CREATININE 3.75 (H) 09/16/2016     HPI :   71 year old female with history of moderate to severe systolic CHF (EF 12-87%), coronary artery disease with RCA stenting in 2007, diet-controlled diabetes mellitus, ESRD on hemodialysis (M, W, F), peripheral arterial disease status post left BKA, recent left femur fracture and left hip arthroplasty, resident of SNF who developed sudden onset of dizziness associated with dyspnea and chest pain during hemodialysis. She was also found to be in hypertensive crisis with systolic blood pressure >867 and was sent to the ED via EMS.  In the ED she was hypotensive with BP of 182/105 mmHg, tachypneic, afebrile. Blood work showed K of 5.4, creatinine 4.7 with mildly elevated troponin (0.3, 0.25) she was noted to have EKG without acute changes. Patient admitted to hospitalist service for hypertensive crisis and chest pain symptoms. She was scheduled for stress test but given her progressive chest pain symptoms for past 3 weeks worsened with exertion was concerning for unstable angina and cardiology decided with cardiac cath  HOSPITAL COURSE:   Unstable angina Continue aspirin, statin and beta blocker. No further chest pain symptoms.  LHC on 4/23 shows: Patent stents LAD with mild stent restenosis. No flow limiting lesions in the LAD. Moderate non-obstructive disease in the Circumflex. Chronic total occlusion mid RCA with filling of the mid and distal RCA by left to right collaterals. Cardiology recommend optimal medical management. Otherwise nonobstructive disease. Filling pressures were good.  Cardiology increased Toprol XL to 50 mg bid. Added imdur 30 mg daily. Continue ASA.  If patient has refractory severe angina she could be considered for CTO PCI of the RCA.   Acute exacerbation of  chronic systolic CHF. 2-D echo showed moderately reduced EF of 40-45% with diffuse hypokinesis and grade 2 diastolic dysfunction. Also had moderate pulmonary hypertension. Managed with hemodialysis. Continue aspirin, beta blocker and ARB.  Hypertensive urgency Likely contributed by unstable angina involving overload. Improved after dialysis.   ESRD on HD Continue hemodialysis on Mondays/Wednesday/Friday schedule with next hemodialysis due 4/25  She is euvolemic at this time    Anemia of chronic kidney disease Hemoglobin levels at acceptable range-continue to monitor for ESA use (s/p Mircera on 4/9).  Chronic depression Continue Lexapro and amitriptyline.  Hyperlipidemia Continue statin  generalized weakness  PT eval. Pt reports she doesnot have enough help at home. May need SNF.   Discharge Exam:  Blood pressure (!) 160/70, pulse 72, temperature 97.8 F (36.6 C), temperature source Oral, resp. rate 18, height _0  (1.651 m), weight 81.4 kg (179 lb 7.3 oz), SpO2 98 %.  GEN:No acute distress. overweight.  Neck:No JVD Cardiac:RRR, no murmurs, rubs, or gallops.  Respiratory:Clear to auscultation bilaterally. EH:MCNO, nontender, non-distended  MS:No edema; left BKA. AV fistula on right arm. Right groin without hematoma. No rash. Neuro:Nonfocal  Psych: Normal affect     Follow-up Information    Sherian Maroon, MD. Call.   Specialty:  Family Medicine Why:  hospital follow up Contact information: [place] [address] [phone]           Signed: Reyne Dumas 09/17/2016, 8:02 AM        Time spent >45 mins

## 2016-09-17 NOTE — Progress Notes (Signed)
D/C order to SNF noted. Pt has a bed offer from [name] (pt's preference). CSW spoke with Lowella Bandy at [facility] who reports she has not received auth from [facility]. Nikki to reach out to [facility] and updated CSW.   Dellie Burns, MSW, LCSW

## 2016-09-17 NOTE — Progress Notes (Signed)
Patient ID: [name], female   DOB: 1945/09/23, 71 y.o.   MRN: [id number]  [facility] Progress Note   Assessment/ Plan:   1. Unstable angina: She is currently free from chest pain but has some exertional dyspnea. Coronary angiography showed patent stents in the LAD with mild stent restenosis (not flow-limiting) as well as moderate nonobstructive disease in the circumflex and total occlusion of the mid RCA with distal filling by collaterals. Medical management recommended. 2. ESRD: Currently getting dialysis per her usual outpatient Monday/Wednesday/Friday schedule. She appears to be close to euvolemic and without any critical electrolytes from last labs. 3. Anemia:  Hemoglobin levels currently acceptable, monitor with dialysis for ESA needs.. 4. CKD-MBD: Restarted calcium carbonate/Fosrenol for phosphorus binding and continue Hectorol for PTH suppression. 5. Hypertension:  Blood pressure slightly elevated today-monitor with ultrafiltration and hemodialysis 6. Deconditioning: she reports that the process is underway to try and get her placed to [name] skilled nursing facility and discharge home thereafter with home physical therapy   Subjective:   Reports some oozing from previous cannulation site of right upper arm fistula with some pain. She denies chest pain and has some exertional dyspnea.   Objective:   BP (!) 160/70 (BP Location: Left Arm)   Pulse 72   Temp 97.8 F (36.6 C) (Oral)   Resp 18   Ht  (1.651 m)   Wt 81.4 kg (179 lb 7.3 oz)   SpO2 98%   BMI 29.86 kg/m   Physical Exam: WUJ:[id number] resting in bed CVS: Pulse regular rhythm, heart sounds S1 and S2 normal Resp: Clear to auscultation, no rales/rhonchi Abd: Soft, flat, nontender Ext: Status post left below-knee amputation, no right lower extremity edema, RUA BBF with associated ecchymosis   Labs: BMET  Recent Labs Lab 09/12/16 0826 09/13/16 1210 09/14/16 1809 09/15/16 1957  09/16/16 0150  NA 134* 132* 131* 130* 136  K 5.4* 3.5 3.7 3.5 3.7  CL 90* 91* 91* 93* 96*  CO2 GLUCOSE 71 87 80 123* 91  BUN 25* 37* 25* 38* 20  CREATININE 4.70* 5.76* 4.58* 5.41* 3.75*  CALCIUM 8.3* 8.1* 8.1* 7.9* 8.1*  PHOS  --  6.5*  --   --   --    CBC  Recent Labs Lab 09/12/16 0740 09/13/16 1210 09/14/16 1809 09/15/16 1957  WBC 7.6 5.1 5.9 6.1  NEUTROABS 5.7  --   --   --   HGB 11.8* 11.5* 12.8 11.9*  HCT 38.1 36.9 40.8 37.5  MCV 97.2 95.8 97.1 96.9  PLT 149* 131* 169 184   Medications:    . calcium carbonate  3 tablet Oral TID  . clotrimazole   Topical BID  . docusate sodium  100 mg Oral BID  . doxercalciferol  2 mcg Intravenous Q M,W,F-HD  . escitalopram  30 mg Oral Daily  . irbesartan  150 mg Oral Daily  . isosorbide mononitrate  30 mg Oral Daily  . lanthanum  1,000 mg Oral TID WC  . levothyroxine  100 mcg Oral QAC breakfast  . metoprolol succinate  50 mg Oral BID  . multivitamin  1 tablet Oral QHS  . nortriptyline  25 mg Oral QHS  . pravastatin  20 mg Oral q1800  . senna-docusate  2 tablet Oral BID  . sodium chloride flush  3 mL Intravenous Q12H   Zetta Bills, MD 09/17/2016, 8:49 AM

## 2016-09-17 NOTE — Progress Notes (Signed)
                                                           PT Note:  Pt has been in HD both times checked on.  Will check back as time allows.   Lyanne Co, PT  Acute Rehab Services  [phone]

## 2016-09-17 NOTE — NC FL2 (Signed)
[place] MEDICAID FL2 LEVEL OF CARE SCREENING TOOL     IDENTIFICATION  Patient Name: [name] Birthdate: [birth year] Sex: female Admission Date (Current Location): 09/12/2016  [facility] and [place] Number:  Producer, television/film/video and Address:  [facility], [address] [place], [place] [address]      Provider Number: [id number]  Attending Physician Name and Address:  Richarda Overlie, MD  Relative Name and Phone Number:       Current Level of Care: Hospital Recommended Level of Care: Skilled Nursing Facility Prior Approval Number:    Date Approved/Denied:   PASRR Number: [id number]  Discharge Plan: SNF    Current Diagnoses: Patient Active Problem List   Diagnosis Date Noted  . Non-ST elevation (NSTEMI) myocardial infarction (HCC)   . Unstable angina (HCC)   . Hypertensive urgency 09/12/2016  . Dyspnea 09/12/2016  . Chest pain 09/12/2016  . Hx of BKA, right (HCC) 07/30/2016  . Closed displaced fracture of condyle of left femur with routine healing 07/10/2016  . Retained orthopedic hardware   . Hx of BKA, left (HCC) 06/18/2016  . Nontraumatic psoas hematoma   . Anemia   . Closed bicondylar fracture of distal femur, left, initial encounter (HCC) 06/09/2016  . Closed left femoral fracture (HCC) 06/08/2016  . Compression fracture of L4 lumbar vertebra (HCC) 06/07/2016  . Acute respiratory failure (HCC) 08/09/2012  . Altered mental status 08/09/2012  . Narcotic overdose 08/09/2012  . Hyperkalemia 08/09/2012  . 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 Peripheral vascular disease, unspecified (HCC) 10/16/2011  . ESRD (end stage renal disease) (HCC) 04/08/2011  . Diabetes mellitus type II 04/08/2011  . Diabetic retinopathy associated with type 2 diabetes mellitus (HCC) 04/08/2011  . GERD (gastroesophageal reflux disease) 04/08/2011  . CAD, multiple vessel 04/08/2011  . PAD (peripheral artery disease) (HCC)  04/08/2011  . CHF (congestive heart failure) (HCC) 04/08/2011  . Hypertension 04/08/2011  . Fibromyalgia 04/08/2011  . Hypothyroid 04/08/2011  . Anxiety 04/08/2011  . Major depression 04/08/2011  . Peripheral neuropathy 04/08/2011  . Morphea 04/08/2011  . Osteoporosis 04/08/2011  . Carpal tunnel syndrome on both sides 04/08/2011  . Refusal of blood transfusions as patient is Jehovah's Witness 04/08/2011  . Hyperparathyroidism, secondary (HCC) 04/08/2011  . Vitamin D deficiency 04/08/2011    Orientation RESPIRATION BLADDER Height & Weight     Self, Time, Situation, Place  Normal Continent Weight: 179 lb 0.2 oz (81.2 kg) Height:   (165.1 cm)  BEHAVIORAL SYMPTOMS/MOOD NEUROLOGICAL BOWEL NUTRITION STATUS      Continent Diet (carb modified)  AMBULATORY STATUS COMMUNICATION OF NEEDS Skin     Verbally Bruising                       Personal Care Assistance Level of Assistance              Functional Limitations Info  Sight, Hearing, Speech Sight Info: Adequate Hearing Info: Adequate Speech Info: Adequate    SPECIAL CARE FACTORS FREQUENCY  PT (By licensed PT), OT (By licensed OT)     PT Frequency: 5x week OT Frequency: 5x week            Contractures Contractures Info: Not present    Additional Factors Info  Code Status, Allergies Code Status Info: DNR Allergies Info: MORPHINE AND RELATED, OTHER, PENICILLINS, PUMPKIN SEED OIL-SAW PALMETTO-ZINC PROPALMEX, SHRIMP SHELLFISH ALLERGY  Current Medications (09/17/2016):  This is the current hospital active medication list Current Facility-Administered Medications  Medication Dose Route Frequency Provider Last Rate Last Dose  . 0.9 %  sodium chloride infusion  250 mL Intravenous PRN Kathleene Hazel, MD      . acetaminophen (TYLENOL) tablet 650 mg  650 mg Oral Q4H PRN Isaiah Blakes, PA-C   650 mg at 09/16/16 0941  . ALPRAZolam Prudy Feeler) tablet 0.25 mg  0.25 mg Oral TID PRN Isaiah Blakes, PA-C   0.25 mg at 09/16/16 2114  . aspirin chewable tablet 81 mg  81 mg Oral Q6H PRN Isaiah Blakes, PA-C      . calcium carbonate (TUMS - dosed in mg elemental calcium) chewable tablet 600 mg of elemental calcium  3 tablet Oral TID Isaiah Blakes, PA-C   600 mg of elemental calcium at 09/13/16 0955  . clonazePAM (KLONOPIN) tablet 1 mg  1 mg Oral BID PRN Isaiah Blakes, PA-C   1 mg at 09/17/16 0554  . clotrimazole (LOTRIMIN) 1 % cream   Topical BID Eddie North, MD   1 application at 09/16/16 2115  . diphenhydrAMINE (BENADRYL) capsule 25 mg  25 mg Oral Q6H PRN Peter M Swaziland, MD   25 mg at 09/17/16 0731  . docusate sodium (COLACE) capsule 100 mg  100 mg Oral BID Isaiah Blakes, PA-C   100 mg at 09/16/16 1610  . doxercalciferol (HECTOROL) injection 2 mcg  2 mcg Intravenous Q M,W,F-HD Pola Corn, NP   2 mcg at 09/15/16 2206  . escitalopram (LEXAPRO) tablet 30 mg  30 mg Oral Daily Isaiah Blakes, PA-C   30 mg at 09/16/16 9604  . heparin injection 3,300 Units  40 Units/kg Dialysis PRN Zetta Bills, MD      . irbesartan Evlyn Kanner) tablet 150 mg  150 mg Oral Daily Isaiah Blakes, PA-C   150 mg at 09/16/16 5409  . isosorbide mononitrate (IMDUR) 24 hr tablet 30 mg  30 mg Oral Daily Peter M Swazil[place], MD   30 mg at 09/16/16 1215  . lanthanum (FOSRENOL) chewable tablet 1,000 mg  1,000 mg Oral TID WC Lenny Pastel, PA-C   500 mg at 09/16/16 1740  . levothyroxine (SYNTHROID, LEVOTHROID) tablet 100 mcg  100 mcg Oral QAC breakfast Isaiah Blakes, PA-C   100 mcg at 09/17/16 0554  . loperamide (IMODIUM) capsule 2 mg  2 mg Oral QID PRN Isaiah Blakes, PA-C   2 mg at 09/17/16 0246  . metoprolol succinate (TOPROL-XL) 24 hr tablet 50 mg  50 mg Oral BID Peter M Swaziland, MD   50 mg at 09/16/16 2114  . multivitamin (RENA-VIT) tablet 1 tablet  1 tablet Oral QHS Lenny Pastel, PA-C   1 tablet at 09/16/16 2114  . nortriptyline (PAMELOR) capsule 25 mg  25 mg Oral QHS Isaiah Blakes, PA-C   25 mg at 09/16/16 2114  . ondansetron (ZOFRAN) injection 4 mg  4 mg Intravenous Q6H PRN Isaiah Blakes, PA-C   4 mg at 09/13/16 0936  . oxyCODONE (Oxy IR/ROXICODONE) immediate release tablet 5 mg  5 mg Oral Q12H PRN Nishant Dhungel, MD   5 mg at 09/17/16 0731  . pravastatin (PRAVACHOL) tablet 20 mg  20 mg Oral q1800 Isaiah Blakes, PA-C   20 mg at 09/16/16 1741  . senna-docusate (Senokot-S) tablet 2 tablet  2 tablet Oral BID Isaiah Blakes, PA-C   2 tablet at 09/16/16  1610  . sodium chloride flush (NS) 0.9 % injection 3 mL  3 mL Intravenous Q12H Kathleene Hazel, MD   3 mL at 09/16/16 2200  . sodium chloride flush (NS) 0.9 % injection 3 mL  3 mL Intravenous PRN Kathleene Hazel, MD         Discharge Medications: Please see discharge summary for a list of discharge medications.  Relevant Imaging Results:  Relevant Lab Results:   Additional Information SS#  Althea Charon, LCSW

## 2016-09-17 NOTE — Care Management Note (Signed)
Case Management Note Donn Pierini RN, BSN Unit 2W-Case Manager [phone]  Patient Details  Name: [name] MRN: [id number] Date of Birth: [birth year]  Subjective/Objective:   Pt admitted with chest pain, hx of ESRD- HD-m/w/f                 Action/Plan: PTA pt lived at home with family- is active with [name]. CM to follow for d/c needs  Expected Discharge Date:  09/17/16               Expected Discharge Plan:  Skilled Nursing Facility  In-House Referral:  Clinical Social Work  Discharge planning Services  CM Consult  Post Acute Care Choice:  NA Choice offered to:  NA  DME Arranged:    DME Agency:     HH Arranged:    HH Agency:     Status of Service:  Completed, signed off  If discussed at [employer] of Stay Meetings, dates discussed:    Discharge Disposition: skilled facility   Additional Comments:  09/17/16- 1330- Logun Colavito RN, CM- pt stable for d/c today- CSW following for placement to SNF- plan is for [facility]- spoke with Daniel Nones at [facility] who has f/u on pt's w/c- family has picked pt's belongs up including w/c from dialysis center and taken them home. Pt informed of this.   09/16/16- 1400- Dwayne Bulkley RN, CM- received call from [facility]- CSWJudithann Sauger- [phone]- regarding d/c plans- [facility] would be agreeable to contract for SNF bed if pt agreeable- pt has mentioned [facility] Will have CSW follow up with pt to see if pt agreeable. (per [facility] pt would not PT eval for them to cover SNF stay)  Darrold Span, RN 09/17/2016, 1:57 PM

## 2016-09-17 NOTE — Evaluation (Signed)
Physical Therapy Evaluation Patient Details Name: [name] MRN: [id number] DOB: 12/10/1945 Today's Date: 09/17/2016   History of Present Illness  71 y.o. female with PMH of CAD s/p stenting of LAD in 06, RCA (07) with Cypher stents, DM, HTN, HL, systolic HF, and ERSD on HD who presented with ongoing chest pain and exertional dyspnea.   Clinical Impression  Pt admitted with above diagnosis. Pt currently with functional limitations due to the deficits listed below (see PT Problem List). Eval limited by pt dizziness, recommend vestibular eval if she stays inpt or in outpt setting at d/c. Mod A to get to sitting EOB and to return to supine.  Pt will benefit from skilled PT to increase their independence and safety with mobility to allow discharge to the venue listed below.      Follow Up Recommendations SNF;Supervision/Assistance - 24 hour    Equipment Recommendations  None recommended by PT    Recommendations for Other Services       Precautions / Restrictions Precautions Precautions: Fall Precaution Comments: history of falls Restrictions Weight Bearing Restrictions: No LLE Weight Bearing: Non weight bearing      Mobility  Bed Mobility Overal bed mobility: Needs Assistance Bed Mobility: Supine to Sit;Sit to Supine     Supine to sit: Mod assist Sit to supine: Mod assist   General bed mobility comments: pt reports increased dizziness with changes of position. Mod A for trunk elevation with supine to sit, mod A for LE's back into bed with sit to supine  Transfers                 General transfer comment: pt felt too dizzy and "head foggy" to attempt transfer  Ambulation/Gait                Stairs            Wheelchair Mobility    Modified Rankin (Stroke Patients Only)       Balance Overall balance assessment: Needs assistance Sitting-balance support: Single extremity supported Sitting balance-Leahy Scale: Fair Sitting balance - Comments:  pt able to maintain static balance but when reaches for object to left loses balance and falls to left onto elbow.                                      Pertinent Vitals/Pain Pain Assessment: Faces Faces Pain Scale: Hurts even more Pain Location: headache and dizziness Pain Descriptors / Indicators: Heaviness Pain Intervention(s): Limited activity within patient's tolerance;Monitored during session    Home Living Family/patient expects to be discharged to:: Skilled nursing facility Living Arrangements: Non-relatives/Friends               Additional Comments: pt came in from home but reports that she will be going to [name] for rehab before home    Prior Function Level of Independence: Independent with assistive device(s)         Comments: Managing independently at wheelchair level, uses slide board for transfers     Hand Dominance        Extremity/Trunk Assessment   Upper Extremity Assessment Upper Extremity Assessment: Generalized weakness    Lower Extremity Assessment Lower Extremity Assessment: Generalized weakness    Cervical / Trunk Assessment Cervical / Trunk Assessment: Kyphotic  Communication   Communication: No difficulties  Cognition Arousal/Alertness: Lethargic Behavior During Therapy: WFL for tasks assessed/performed Overall Cognitive Status: Within  Functional Limits for tasks assessed                                        General Comments General comments (skin integrity, edema, etc.): O2 sats in 90's on RA, HR 71 bpm    Exercises     Assessment/Plan    PT Assessment Patient needs continued PT services  PT Problem List Decreased strength;Decreased activity tolerance;Decreased balance;Decreased mobility;Decreased knowledge of precautions;Pain       PT Treatment Interventions DME instruction;Functional mobility training;Therapeutic activities;Therapeutic exercise;Balance training;Patient/family  education    PT Goals (Current goals can be found in the Care Plan section)  Acute Rehab PT Goals Patient Stated Goal: rehab then back home PT Goal Formulation: With patient Time For Goal Achievement: 10/01/16 Potential to Achieve Goals: Good    Frequency Min 2X/week   Barriers to discharge        [name]-evaluation               End of Session   Activity Tolerance: Patient limited by fatigue;Other (comment) (dizziness) Patient left: in bed;with bed alarm set;with call bell/phone within reach;with family/visitor present Nurse Communication: Mobility status PT Visit Diagnosis: Muscle weakness (generalized) (M62.81);Dizziness and giddiness (R42)    Time: 4098-1191 PT Time Calculation (min) (ACUTE ONLY): 21 min   Charges:   PT Evaluation $PT Eval Moderate Complexity: 1 Procedure     PT G Codes:        [name], PT  Acute Rehab Services  [phone]   [name] 09/17/2016, 4:14 PM

## 2016-09-17 NOTE — Clinical Social Work Placement (Signed)
   CLINICAL SOCIAL WORK PLACEMENT  NOTE  Date:  09/17/2016  Patient Details  Name: [name] MRN: [id number] Date of Birth: [birth year]  Clinical Social Work is seeking post-discharge placement for this patient at the Skilled  Nursing Facility level of care (*CSW will initial, date and re-position this form in  chart as items are completed):  Yes   Patient/family provided with [facility] Clinical Social Work Department's list of facilities offering this level of care within the geographic area requested by the patient (or if unable, by the patient's family).  Yes   Patient/family informed of their freedom to choose among providers that offer the needed level of care, that participate in Medicare, Medicaid or managed care program needed by the patient, have an available bed and are willing to accept the patient.  Yes   Patient/family informed of 's ownership interest in [facility] and [facility], as well as of the fact that they are under no obligation to receive care at these facilities.  PASRR submitted to EDS on       PASRR number received on       Existing PASRR number confirmed on       FL2 transmitted to all facilities in geographic area requested by pt/family on       FL2 transmitted to all facilities within larger geographic area on       Patient informed that his/her managed care company has contracts with or will negotiate with certain facilities, including the following:            Patient/family informed of bed offers received.  Patient chooses bed at       Physician recommends and patient chooses bed at      Patient to be transferred to   on  .  Patient to be transferred to facility by       Patient family notified on   of transfer.  Name of family member notified:        PHYSICIAN Please sign FL2     Additional Comment:    _______________________________________________ Althea Charon, LCSW 09/17/2016, 10:44 AM

## 2016-09-17 NOTE — Progress Notes (Signed)
Progress Note  Patient Name: [name] Date of Encounter: 09/17/2016  Primary Cardiologist: new  Subjective   Seen post dialysis. Nurse reports some chest discomfort during dialysis. She denies any chest pain now. Otherwise doing well. Has not received morning meds yet.    Inpatient Medications    Scheduled Meds: . calcium carbonate  3 tablet Oral TID  . clotrimazole   Topical BID  . docusate sodium  100 mg Oral BID  . doxercalciferol  2 mcg Intravenous Q M,W,F-HD  . escitalopram  30 mg Oral Daily  . irbesartan  150 mg Oral Daily  . isosorbide mononitrate  30 mg Oral Daily  . lanthanum  1,000 mg Oral TID WC  . levothyroxine  100 mcg Oral QAC breakfast  . metoprolol succinate  50 mg Oral BID  . multivitamin  1 tablet Oral QHS  . nortriptyline  25 mg Oral QHS  . pravastatin  20 mg Oral q1800  . senna-docusate  2 tablet Oral BID  . sodium chloride flush  3 mL Intravenous Q12H   Continuous Infusions: . sodium chloride     PRN Meds: sodium chloride, acetaminophen, ALPRAZolam, aspirin, clonazePAM, diphenhydrAMINE, loperamide, ondansetron (ZOFRAN) IV, oxyCODONE, sodium chloride flush   Vital Signs    Vitals:   09/17/16 1155 09/17/16 1225 09/17/16 1341 09/17/16 1356  BP: (!) 149/83 (!) 145/80 135/75 (!) 153/70  Pulse: 66 68 67 62  Resp:   17   Temp:   98 F (36.7 C)   TempSrc:   Oral   SpO2:  98% 93%   Weight:  172 lb 6.4 oz (78.2 kg)    Height:        Intake/Output Summary (Last 24 hours) at 09/17/16 1426 Last data filed at 09/17/16 1300  Gross per 24 hour  Intake              720 ml  Output              400 ml  Net              320 ml   Filed Weights   09/17/16 0254 09/17/16 0830 09/17/16 1225  Weight: 179 lb 7.3 oz (81.4 kg) 179 lb 0.2 oz (81.2 kg) 172 lb 6.4 oz (78.2 kg)    Telemetry    NSR - Personally Reviewed  ECG    NSR nonspecific IVCD - Personally Reviewed  Physical Exam   GEN: No acute distress. overweight.  Neck: No JVD Cardiac:  RRR, no murmurs, rubs, or gallops.  Respiratory: Clear to auscultation bilaterally. GI: Soft, nontender, non-distended  MS: No edema; left BKA. AV fistula on right arm with extensive bruising over it. Right groin without hematoma. No rash. Neuro:  Nonfocal  Psych: Normal affect   Labs    Chemistry  Recent Labs Lab 09/12/16 0826 09/13/16 1210 09/14/16 1809 09/15/16 1957 09/16/16 0150  NA 134* 132* 131* 130* 136  K 5.4* 3.5 3.7 3.5 3.7  CL 90* 91* 91* 93* 96*  CO2 GLUCOSE 71 87 80 123* 91  BUN 25* 37* 25* 38* 20  CREATININE 4.70* 5.76* 4.58* 5.41* 3.75*  CALCIUM 8.3* 8.1* 8.1* 7.9* 8.1*  PROT 6.0*  --  5.7*  --   --   ALBUMIN 3.2* 2.9* 3.0*  --   --   AST 90*  --  131*  --   --   ALT 172*  --  143*  --   --  ALKPHOS 116  --  154*  --   --   BILITOT 2.2*  --  1.8*  --   --   GFRNONAA 9* 7* 9* 7* 11*  GFRAA 10* 8* 10* 9* 13*  ANIONGAP 20* 18* 17* 14 16*     Hematology  Recent Labs Lab 09/14/16 1809 09/15/16 1957 09/17/16 0844  WBC 5.9 6.1 6.4  RBC 4.20 3.87 3.49*  HGB 12.8 11.9* 11.1*  HCT 40.8 37.5 34.5*  MCV 97.1 96.9 98.9  MCH 30.5 30.7 31.8  MCHC 31.4 31.7 32.2  RDW 21.4* 21.9* 22.5*  PLT 169 184 150    Cardiac Enzymes  Recent Labs Lab 09/12/16 2026  TROPONINI 0.23*     Recent Labs Lab 09/12/16 0744 09/12/16 1211  TROPIPOC 0.30* 0.25*     BNPNo results for input(s): BNP, PROBNP in the last 168 hours.   DDimer No results for input(s): DDIMER in the last 168 hours.   Radiology    No results found.  Cardiac Studies   Echo: 09/14/16: Study Conclusions  - Left ventricle: The cavity size was normal. There was moderate   focal basal hypertrophy. Systolic function was mildly to   moderately reduced. The estimated ejection fraction was in the   range of 40% to 45%. Moderate diffuse hypokinesis. Features are   consistent with a pseudonormal left ventricular filling pattern,   with concomitant abnormal relaxation and  increased filling   pressure (grade 2 diastolic dysfunction). Doppler parameters are   consistent with high ventricular filling pressure. Ratio of   mitral valve peak E velocity to average of medial and lateral   annulus peak E velocity: 23.71. - Aortic valve: Trileaflet; normal thickness, mildly calcified   leaflets. - Mitral valve: Moderately calcified annulus. Mildly thickened,   mildly calcified leaflets anterior. There was mild regurgitation. - Left atrium: The atrium was moderately dilated.   Anterior-posterior dimension: 38 mm. Volume/bsa, ES, (1-plane   Simpson&'s, A2C): 58.1 ml/m^2. - Right ventricle: The cavity size was moderately dilated. Wall   thickness was normal. Systolic function was mildly reduced. - Right atrium: The atrium was moderately to severely dilated. - Tricuspid valve: There was moderate regurgitation. - Pulmonary arteries: PA peak pressure: 52 mm Hg (S).  Impressions:  - The right ventricular systolic pressure was increased consistent   with moderate pulmonary hypertension.  Procedures   Left Heart Cath and Coronary Angiography  Conclusion     Prox RCA to Mid RCA lesion, 0 %stenosed.  Prox RCA lesion, 60 %stenosed.  Mid RCA lesion, 100 %stenosed.  Prox Cx to Mid Cx lesion, 50 %stenosed.  Ost 2nd Mrg to 2nd Mrg lesion, 30 %stenosed.  Ost LAD to Prox LAD lesion, 20 %stenosed.  Mid LAD lesion, 40 %stenosed.  Mid LAD to Dist LAD lesion, 20 %stenosed.  Dist LAD lesion, 20 %stenosed.  Ost 2nd Diag lesion, 70 %stenosed.   1. Patent stents LAD with mild stent restenosis. No flow limiting lesions in the LAD 2. Moderate non-obstructive disease in the Circumflex 3. Chronic total occlusion mid RCA with filling of the mid and distal RCA by left to right collaterals.  4. Normal filling pressures  Recommendations: Continue medical management of CAD      Patient Profile     71 y.o. female with PMH of CAD s/p stenting of LAD in 06, RCA  (07) with Cypher stents, DM, HTN, HL, systolic HF, and ERSD on HD who presented with ongoing chest pain and exertional dyspnea.  Assessment & Plan    1. Unstable Angina: Known CAD with last ischemic evaluation in 2007 with stenting to the RCA. Prior stenting of LAD in 2006.  Symptoms on admission concerning for ACS. Mild troponin elevation. C Cardiac cath showed occlusion of the mid RCA in old stent. She has good left to right collaterals. Otherwise nonobstructive disease. Filling pressures were good.  Medications increased with  Toprol XL to 50 mg bid. Added imdur 30 mg daily. Continue ASA.  Unclear about her symptoms on dialysis. I don't think she is any immediate danger. Would continue current antianginal therapy and monitor. Will need follow up with our office post discharge. We can titrate antianginal therapy more as BP tolerates. I think she is stable for DC today.  2. ESRD on HD: Nephrology following.   3. Chronic Systolic HF: Does not appear to be significantly volume overloaded. Management per renal. EF looks stable compared with 2012. 35-40%>> 40-45%.   4. HL: on pravastatin- needs updated lipid panel.  5. Hx of DM: states this is now diet controlled  6. Pruritis. No clear rash- improved with Benadryl.   Signed, Peter Swaziland, MD  09/17/2016, 2:26 PM

## 2016-09-17 NOTE — Progress Notes (Signed)
Pt was seen by Dr. Swaziland, cardiologist, who said pt can be discharged.

## 2016-09-17 NOTE — Procedures (Signed)
Patient seen on Hemodialysis. QB 400, UF goal 3.5L Treatment adjusted as needed.  Zetta Bills MD [facility]. Office # [phone] Pager # [phone] 8:54 AM

## 2016-09-17 NOTE — Clinical Social Work Note (Signed)
Clinical Social Worker facilitated patient discharge including contacting patient family and facility to confirm patient discharge plans.  Clinical information faxed to facility and family agreeable with plan.  CSW arranged ambulance transport via [facility] to [name] .  RN to call [phone] for report prior to discharge. Patient will go to room 112.  Clinical Social Worker will sign off for now as social work intervention is no longer needed. Please consult [place] again if new need arises.  [place], [place] [phone]

## 2016-10-25 NOTE — Addendum Note (Signed)
Addendum  created 10/25/16 0926 by Tasheema Perrone, MD   Sign clinical note    

## 2016-10-27 NOTE — Addendum Note (Signed)
Addendum  created 10/27/16 1004 by Janaisha Tolsma, MD   Sign clinical note    

## 2016-10-31 ENCOUNTER — Encounter (HOSPITAL_COMMUNITY): Payer: Self-pay | Admitting: Emergency Medicine

## 2016-10-31 ENCOUNTER — Emergency Department (HOSPITAL_COMMUNITY): Payer: Medicare (Managed Care)

## 2016-10-31 ENCOUNTER — Inpatient Hospital Stay (HOSPITAL_COMMUNITY): Payer: Medicare (Managed Care)

## 2016-10-31 DIAGNOSIS — I2510 Atherosclerotic heart disease of native coronary artery without angina pectoris: Secondary | ICD-10-CM | POA: Diagnosis present

## 2016-10-31 DIAGNOSIS — T82855A Stenosis of coronary artery stent, initial encounter: Secondary | ICD-10-CM | POA: Diagnosis present

## 2016-10-31 DIAGNOSIS — E872 Acidosis, unspecified: Secondary | ICD-10-CM

## 2016-10-31 DIAGNOSIS — J9811 Atelectasis: Secondary | ICD-10-CM | POA: Diagnosis present

## 2016-10-31 DIAGNOSIS — R21 Rash and other nonspecific skin eruption: Secondary | ICD-10-CM | POA: Diagnosis present

## 2016-10-31 DIAGNOSIS — N2581 Secondary hyperparathyroidism of renal origin: Secondary | ICD-10-CM | POA: Diagnosis present

## 2016-10-31 DIAGNOSIS — E039 Hypothyroidism, unspecified: Secondary | ICD-10-CM | POA: Diagnosis present

## 2016-10-31 DIAGNOSIS — Z89512 Acquired absence of left leg below knee: Secondary | ICD-10-CM

## 2016-10-31 DIAGNOSIS — G9341 Metabolic encephalopathy: Secondary | ICD-10-CM | POA: Diagnosis present

## 2016-10-31 DIAGNOSIS — Z515 Encounter for palliative care: Secondary | ICD-10-CM

## 2016-10-31 DIAGNOSIS — I959 Hypotension, unspecified: Secondary | ICD-10-CM | POA: Diagnosis present

## 2016-10-31 DIAGNOSIS — M797 Fibromyalgia: Secondary | ICD-10-CM | POA: Diagnosis present

## 2016-10-31 DIAGNOSIS — R4182 Altered mental status, unspecified: Secondary | ICD-10-CM | POA: Diagnosis not present

## 2016-10-31 DIAGNOSIS — Z87891 Personal history of nicotine dependence: Secondary | ICD-10-CM

## 2016-10-31 DIAGNOSIS — Z7952 Long term (current) use of systemic steroids: Secondary | ICD-10-CM

## 2016-10-31 DIAGNOSIS — I132 Hypertensive heart and chronic kidney disease with heart failure and with stage 5 chronic kidney disease, or end stage renal disease: Secondary | ICD-10-CM | POA: Diagnosis present

## 2016-10-31 DIAGNOSIS — D696 Thrombocytopenia, unspecified: Secondary | ICD-10-CM | POA: Diagnosis present

## 2016-10-31 DIAGNOSIS — I252 Old myocardial infarction: Secondary | ICD-10-CM | POA: Diagnosis not present

## 2016-10-31 DIAGNOSIS — R740 Nonspecific elevation of levels of transaminase and lactic acid dehydrogenase [LDH]: Secondary | ICD-10-CM | POA: Diagnosis not present

## 2016-10-31 DIAGNOSIS — E1151 Type 2 diabetes mellitus with diabetic peripheral angiopathy without gangrene: Secondary | ICD-10-CM | POA: Diagnosis present

## 2016-10-31 DIAGNOSIS — N186 End stage renal disease: Secondary | ICD-10-CM | POA: Diagnosis present

## 2016-10-31 DIAGNOSIS — Z6826 Body mass index (BMI) 26.0-26.9, adult: Secondary | ICD-10-CM

## 2016-10-31 DIAGNOSIS — I739 Peripheral vascular disease, unspecified: Secondary | ICD-10-CM | POA: Diagnosis present

## 2016-10-31 DIAGNOSIS — R531 Weakness: Secondary | ICD-10-CM

## 2016-10-31 DIAGNOSIS — E1142 Type 2 diabetes mellitus with diabetic polyneuropathy: Secondary | ICD-10-CM | POA: Diagnosis present

## 2016-10-31 DIAGNOSIS — G9340 Encephalopathy, unspecified: Secondary | ICD-10-CM

## 2016-10-31 DIAGNOSIS — R17 Unspecified jaundice: Secondary | ICD-10-CM | POA: Diagnosis present

## 2016-10-31 DIAGNOSIS — I509 Heart failure, unspecified: Secondary | ICD-10-CM

## 2016-10-31 DIAGNOSIS — Y831 Surgical operation with implant of artificial internal device as the cause of abnormal reaction of the patient, or of later complication, without mention of misadventure at the time of the procedure: Secondary | ICD-10-CM | POA: Diagnosis present

## 2016-10-31 DIAGNOSIS — E11319 Type 2 diabetes mellitus with unspecified diabetic retinopathy without macular edema: Secondary | ICD-10-CM | POA: Diagnosis present

## 2016-10-31 DIAGNOSIS — E785 Hyperlipidemia, unspecified: Secondary | ICD-10-CM | POA: Diagnosis present

## 2016-10-31 DIAGNOSIS — Z807 Family history of other malignant neoplasms of lymphoid, hematopoietic and related tissues: Secondary | ICD-10-CM | POA: Diagnosis not present

## 2016-10-31 DIAGNOSIS — D72829 Elevated white blood cell count, unspecified: Secondary | ICD-10-CM | POA: Diagnosis present

## 2016-10-31 DIAGNOSIS — I5022 Chronic systolic (congestive) heart failure: Secondary | ICD-10-CM | POA: Diagnosis present

## 2016-10-31 DIAGNOSIS — R634 Abnormal weight loss: Secondary | ICD-10-CM | POA: Diagnosis present

## 2016-10-31 DIAGNOSIS — S9031XA Contusion of right foot, initial encounter: Secondary | ICD-10-CM | POA: Diagnosis present

## 2016-10-31 DIAGNOSIS — Z955 Presence of coronary angioplasty implant and graft: Secondary | ICD-10-CM

## 2016-10-31 DIAGNOSIS — Z66 Do not resuscitate: Secondary | ICD-10-CM | POA: Diagnosis present

## 2016-10-31 DIAGNOSIS — R627 Adult failure to thrive: Secondary | ICD-10-CM | POA: Diagnosis present

## 2016-10-31 DIAGNOSIS — E1122 Type 2 diabetes mellitus with diabetic chronic kidney disease: Secondary | ICD-10-CM | POA: Diagnosis present

## 2016-10-31 DIAGNOSIS — Z8249 Family history of ischemic heart disease and other diseases of the circulatory system: Secondary | ICD-10-CM | POA: Diagnosis not present

## 2016-10-31 DIAGNOSIS — Z992 Dependence on renal dialysis: Secondary | ICD-10-CM

## 2016-10-31 DIAGNOSIS — L8990 Pressure ulcer of unspecified site, unspecified stage: Secondary | ICD-10-CM | POA: Insufficient documentation

## 2016-10-31 DIAGNOSIS — R5383 Other fatigue: Secondary | ICD-10-CM | POA: Diagnosis not present

## 2016-10-31 DIAGNOSIS — Y92129 Unspecified place in nursing home as the place of occurrence of the external cause: Secondary | ICD-10-CM

## 2016-10-31 DIAGNOSIS — I214 Non-ST elevation (NSTEMI) myocardial infarction: Secondary | ICD-10-CM | POA: Diagnosis present

## 2016-10-31 DIAGNOSIS — Z9049 Acquired absence of other specified parts of digestive tract: Secondary | ICD-10-CM

## 2016-10-31 DIAGNOSIS — L8991 Pressure ulcer of unspecified site, stage 1: Secondary | ICD-10-CM | POA: Diagnosis present

## 2016-10-31 DIAGNOSIS — X58XXXA Exposure to other specified factors, initial encounter: Secondary | ICD-10-CM | POA: Diagnosis present

## 2016-10-31 DIAGNOSIS — N179 Acute kidney failure, unspecified: Secondary | ICD-10-CM | POA: Diagnosis not present

## 2016-10-31 DIAGNOSIS — M898X9 Other specified disorders of bone, unspecified site: Secondary | ICD-10-CM | POA: Diagnosis present

## 2016-10-31 DIAGNOSIS — Z7189 Other specified counseling: Secondary | ICD-10-CM

## 2016-10-31 DIAGNOSIS — Z833 Family history of diabetes mellitus: Secondary | ICD-10-CM | POA: Diagnosis not present

## 2016-10-31 DIAGNOSIS — R54 Age-related physical debility: Secondary | ICD-10-CM | POA: Diagnosis present

## 2016-10-31 DIAGNOSIS — R630 Anorexia: Secondary | ICD-10-CM | POA: Diagnosis present

## 2016-10-31 DIAGNOSIS — G8929 Other chronic pain: Secondary | ICD-10-CM | POA: Diagnosis present

## 2016-10-31 DIAGNOSIS — I272 Pulmonary hypertension, unspecified: Secondary | ICD-10-CM | POA: Diagnosis present

## 2016-10-31 LAB — COMPREHENSIVE METABOLIC PANEL
ALBUMIN: 3.6 g/dL (ref 3.5–5.0)
ALK PHOS: 88 U/L (ref 38–126)
ALT: 175 U/L — ABNORMAL HIGH (ref 14–54)
AST: 229 U/L — ABNORMAL HIGH (ref 15–41)
Anion gap: 24 — ABNORMAL HIGH (ref 5–15)
BUN: 62 mg/dL — AB (ref 6–20)
CO2: 19 mmol/L — AB (ref 22–32)
Calcium: 8.6 mg/dL — ABNORMAL LOW (ref 8.9–10.3)
Chloride: 91 mmol/L — ABNORMAL LOW (ref 101–111)
Creatinine, Ser: 5.49 mg/dL — ABNORMAL HIGH (ref 0.44–1.00)
GFR calc Af Amer: 8 mL/min — ABNORMAL LOW (ref >60)
GFR calc non Af Amer: 7 mL/min — ABNORMAL LOW (ref >60)
Glucose, Bld: 100 mg/dL — ABNORMAL HIGH (ref 65–99)
Potassium: 5.1 mmol/L (ref 3.5–5.1)
SODIUM: 134 mmol/L — AB (ref 135–145)
TOTAL PROTEIN: 5.8 g/dL — AB (ref 6.5–8.1)
Total Bilirubin: 4.4 mg/dL — ABNORMAL HIGH (ref 0.3–1.2)

## 2016-10-31 LAB — I-STAT VENOUS BLOOD GAS, ED
Acid-base deficit: 3 mmol/L — ABNORMAL HIGH (ref 0.0–2.0)
BICARBONATE: 21.4 mmol/L (ref 20.0–28.0)
O2 Saturation: 83 %
PH VEN: 7.385 (ref 7.250–7.430)
PO2 VEN: 48 mmHg — AB (ref 32.0–45.0)
TCO2: 23 mmol/L (ref 0–100)
pCO2, Ven: 35.8 mmHg — ABNORMAL LOW (ref 44.0–60.0)

## 2016-10-31 LAB — CBC WITH DIFFERENTIAL/PLATELET
BASOS PCT: 0 %
Basophils Absolute: 0 10*3/uL (ref 0.0–0.1)
EOS ABS: 0 10*3/uL (ref 0.0–0.7)
Eosinophils Relative: 0 %
HEMATOCRIT: 48.9 % — AB (ref 36.0–46.0)
HEMOGLOBIN: 15.9 g/dL — AB (ref 12.0–15.0)
LYMPHS PCT: 9 %
Lymphs Abs: 0.7 10*3/uL (ref 0.7–4.0)
MCH: 34.3 pg — AB (ref 26.0–34.0)
MCHC: 32.5 g/dL (ref 30.0–36.0)
MCV: 105.4 fL — ABNORMAL HIGH (ref 78.0–100.0)
Monocytes Absolute: 0.2 10*3/uL (ref 0.1–1.0)
Monocytes Relative: 3 %
NEUTROS ABS: 7.2 10*3/uL (ref 1.7–7.7)
NEUTROS PCT: 88 %
Platelets: UNDETERMINED 10*3/uL (ref 150–400)
RBC: 4.64 MIL/uL (ref 3.87–5.11)
RDW: 22.8 % — ABNORMAL HIGH (ref 11.5–15.5)
WBC: 8.1 10*3/uL (ref 4.0–10.5)

## 2016-10-31 LAB — BLOOD GAS, VENOUS

## 2016-10-31 LAB — TYPE AND SCREEN
ABO/RH(D): O NEG
Antibody Screen: NEGATIVE

## 2016-10-31 LAB — PROTIME-INR
INR: 1.59
PROTHROMBIN TIME: 19.1 s — AB (ref 11.4–15.2)

## 2016-10-31 LAB — I-STAT TROPONIN, ED: Troponin i, poc: 0.84 ng/mL (ref 0.00–0.08)

## 2016-10-31 LAB — I-STAT CG4 LACTIC ACID, ED: Lactic Acid, Venous: 4.88 mmol/L (ref 0.5–1.9)

## 2016-10-31 LAB — CBG MONITORING, ED: GLUCOSE-CAPILLARY: 96 mg/dL (ref 65–99)

## 2016-10-31 LAB — AMMONIA: Ammonia: 39 umol/L — ABNORMAL HIGH (ref 9–35)

## 2016-10-31 LAB — TROPONIN I: Troponin I: 1.01 ng/mL (ref <0.03)

## 2016-10-31 LAB — GLUCOSE, CAPILLARY: GLUCOSE-CAPILLARY: 88 mg/dL (ref 65–99)

## 2016-10-31 LAB — LACTIC ACID, PLASMA: LACTIC ACID, VENOUS: 2.6 mmol/L — AB (ref 0.5–1.9)

## 2016-10-31 LAB — LIPASE, BLOOD: LIPASE: 33 U/L (ref 11–51)

## 2016-10-31 LAB — ACETAMINOPHEN LEVEL

## 2016-10-31 MED ORDER — SODIUM CHLORIDE 0.9 % IV BOLUS (SEPSIS)
250.0000 mL | Freq: Once | INTRAVENOUS | Status: AC
  Administered 2016-10-31: 250 mL via INTRAVENOUS

## 2016-10-31 MED ORDER — SODIUM CHLORIDE 0.9 % IV SOLN
100.0000 mL | INTRAVENOUS | Status: DC | PRN
Start: 2016-10-31 — End: 2016-10-31

## 2016-10-31 MED ORDER — DOXERCALCIFEROL 4 MCG/2ML IV SOLN
3.0000 ug | INTRAVENOUS | Status: DC
  Administered 2016-10-31: 3 ug via INTRAVENOUS

## 2016-10-31 MED ORDER — LIDOCAINE HCL (PF) 1 % IJ SOLN: 5.0000 mL | INTRAMUSCULAR | Status: DC | PRN

## 2016-10-31 MED ORDER — CALCIUM CARBONATE ANTACID 500 MG PO CHEW
3.0000 | CHEWABLE_TABLET | Freq: Three times a day (TID) | ORAL | Status: DC
  Administered 2016-11-01: 600 mg via ORAL
  Filled 2016-10-31 (×3): qty 3

## 2016-10-31 MED ORDER — HEPARIN BOLUS VIA INFUSION
4000.0000 [IU] | Freq: Once | INTRAVENOUS | Status: AC
Start: 2016-10-31 — End: 2016-10-31
  Administered 2016-10-31: 4000 [IU] via INTRAVENOUS
  Filled 2016-10-31: qty 4000

## 2016-10-31 MED ORDER — ALBUMIN HUMAN 25 % IV SOLN
25.0000 g | Freq: Once | INTRAVENOUS | Status: AC
  Administered 2016-10-31: 25 g via INTRAVENOUS

## 2016-10-31 MED ORDER — HEPARIN SODIUM (PORCINE) 1000 UNIT/ML DIALYSIS
1000.0000 [IU] | INTRAMUSCULAR | Status: DC | PRN
Start: 2016-10-31 — End: 2016-10-31

## 2016-10-31 MED ORDER — HEPARIN (PORCINE) IN NACL 100-0.45 UNIT/ML-% IJ SOLN
1000.0000 [IU]/h | INTRAMUSCULAR | Status: DC
  Administered 2016-10-31: 900 [IU]/h via INTRAVENOUS
  Filled 2016-10-31 (×2): qty 250

## 2016-10-31 MED ORDER — LIDOCAINE-PRILOCAINE 2.5-2.5 % EX CREA: 1.0000 "application " | TOPICAL_CREAM | CUTANEOUS | Status: DC | PRN

## 2016-10-31 MED ORDER — DOXERCALCIFEROL 4 MCG/2ML IV SOLN
INTRAVENOUS | Status: AC
  Filled 2016-10-31: qty 2

## 2016-10-31 MED ORDER — ALBUMIN HUMAN 25 % IV SOLN
INTRAVENOUS | Status: AC
  Administered 2016-10-31: 25 g via INTRAVENOUS
  Filled 2016-10-31: qty 100

## 2016-10-31 MED ORDER — SODIUM CHLORIDE 0.9 % IV SOLN: 100.0000 mL | INTRAVENOUS | Status: DC | PRN

## 2016-10-31 MED ORDER — LEVOTHYROXINE SODIUM 100 MCG PO TABS
100.0000 ug | ORAL_TABLET | Freq: Every day | ORAL | Status: DC
  Administered 2016-11-01: 100 ug via ORAL
  Filled 2016-10-31 (×2): qty 1

## 2016-10-31 MED ORDER — SODIUM CHLORIDE 0.9% FLUSH
3.0000 mL | Freq: Two times a day (BID) | INTRAVENOUS | Status: DC
  Administered 2016-10-31 – 2016-11-02 (×4): 3 mL via INTRAVENOUS

## 2016-10-31 MED ORDER — ASPIRIN 325 MG PO TABS
325.0000 mg | ORAL_TABLET | Freq: Every day | ORAL | Status: DC
  Administered 2016-11-01 (×2): 325 mg via ORAL
  Filled 2016-10-31 (×2): qty 1

## 2016-10-31 MED ORDER — PRAVASTATIN SODIUM 20 MG PO TABS
20.0000 mg | ORAL_TABLET | Freq: Every day | ORAL | Status: DC
  Filled 2016-10-31: qty 1

## 2016-10-31 MED ORDER — HEPARIN SODIUM (PORCINE) 5000 UNIT/ML IJ SOLN: 5000.0000 [IU] | Freq: Three times a day (TID) | INTRAMUSCULAR | Status: DC

## 2016-10-31 MED ORDER — SODIUM CHLORIDE 0.9 % IV BOLUS (SEPSIS): 500.0000 mL | Freq: Once | INTRAVENOUS | Status: DC

## 2016-10-31 MED ORDER — ESCITALOPRAM OXALATE 10 MG PO TABS
20.0000 mg | ORAL_TABLET | Freq: Every day | ORAL | Status: DC
  Administered 2016-11-01: 20 mg via ORAL
  Filled 2016-10-31 (×2): qty 2

## 2016-10-31 MED ORDER — PENTAFLUOROPROP-TETRAFLUOROETH EX AERO: 1.0000 "application " | INHALATION_SPRAY | CUTANEOUS | Status: DC | PRN

## 2016-10-31 MED ORDER — HEPARIN SODIUM (PORCINE) 1000 UNIT/ML DIALYSIS: 20.0000 [IU]/kg | INTRAMUSCULAR | Status: DC | PRN

## 2016-10-31 NOTE — Progress Notes (Signed)
CRITICAL VALUE ALERT  Critical Value: troponin 1.01  Date & Time Notied:  Nov 22, 2016 1920 Provider Notified: yes  Orders Received/Actions taken:none

## 2016-10-31 NOTE — Progress Notes (Signed)
ANTICOAGULATION CONSULT NOTE - Initial Consult  Pharmacy Consult for heparin Indication: chest pain/ACS  Allergies  Allergen Reactions  . Morphine And Related Other (See Comments)    Pt fell into deep sleep and could not be woken up had to be resuscitated says she did not overdose, unsure whether throat swelled or SOB pt does not remember  . Other Anaphylaxis    PEACHES  . Penicillins Swelling    Tongue swelling.   Marland Kitchen Pumpkin Seed Oil-Saw Palmetto-Zinc [Propalmex] Anaphylaxis  . Shrimp [Shellfish Allergy] Anaphylaxis  . Food Other (See Comments)    Nectarine, Pumpkin  . Lisinopril Other (See Comments)    unknown    Patient Measurements: Height: 5\' 5"  (165.1 cm) Weight: 169 lb 12.1 oz (77 kg) (pt reported) IBW/kg (Calculated) : 57 Heparin Dosing Weight: 73 kg  Vital Signs: Temp: 97.4 F (36.3 C) (06/08 1734) Temp Source: Axillary (06/08 1734) BP: 143/78 (06/08 1900) Pulse Rate: 75 (06/08 1900)  Labs:  Recent Labs  November 21, 2016 1142 November 21, 2016 1145 11/21/2016 1249 11-21-2016 1725  HGB  --  15.9*  --   --   HCT  --  48.9*  --   --   PLT  --  PLATELET CLUMPS NOTED ON SMEAR, UNABLE TO ESTIMATE  --   --   LABPROT 19.1*  --   --   --   INR 1.59  --   --   --   CREATININE  --   --  5.49*  --   TROPONINI  --   --   --  1.01*    Estimated Creatinine Clearance: 10.1 mL/min (A) (by C-G formula based on SCr of 5.49 mg/dL (H)).   Medical History: Past Medical History:  Diagnosis Date  . Anxiety   . Asthma   . CAD (coronary artery disease)    s/p MI x 2, s/p stent x 4 (05/2005 with DES placed at that time)  . Carpal tunnel syndrome, bilateral   . Chronic systolic congestive heart failure (HCC)    EF 35-40% per 2D echo (03/2011)  . Constipation   . Diabetes mellitus type 2, controlled, with complications (HCC)    diet controlled  . Diabetic retinopathy   . ESRD (end stage renal disease) on dialysis [facility]) HD since 07/2008   [facility]; Mon, [place], Friday  . Fibromyalgia   .  Hemorrhoids   . Hyperlipidemia   . Hypertension   . Hypothyroid   . Major depression   . Migraines   . Morphea   . Normocytic anemia    BL Hgb 10-12  //  Chronic, likely multifactorial AOCD and possible iron deficiency component with transferrin sat < 20 historically.  . Osteoporosis   . PAD (peripheral artery disease) (HCC)    s/p L BKA  . Peripheral neuropathy   . PONV (postoperative nausea and vomiting)   . Pulmonary hypertension (HCC)    PA Peak pressure 57 mmHg per 2D echo (03/2011)  . Refusal of blood transfusions as patient is Jehovah's Witness   . Secondary hyperparathyroidism (HCC)   . Vitamin D deficiency     Medications:  Prescriptions Prior to Admission  Medication Sig Dispense Refill Last Dose  . acetaminophen (TYLENOL) 325 MG tablet Take 650 mg by mouth every 6 (six) hours as needed for mild pain.   Past Month at Unknown time  . ALPRAZolam (XANAX) 0.5 MG tablet Take 0.5 mg by mouth 3 (three) times daily.   10/30/2016 at Unknown time  .  calcium carbonate (TUMS - DOSED IN MG ELEMENTAL CALCIUM) 500 MG chewable tablet Chew 3 tablets by mouth 3 (three) times daily.    Past Month at Unknown time  . cetirizine (ZYRTEC) 10 MG tablet Take 10 mg by mouth daily.   10/30/2016 at Unknown time  . clonazePAM (KLONOPIN) 1 MG tablet Take 1 mg by mouth 2 (two) times daily.   10/30/2016 at Unknown time  . diphenhydrAMINE (BENADRYL) 50 MG/ML injection Inject 100 mg into the vein 3 (three) times daily as needed for itching.   Past Month at Unknown time  . docusate sodium (COLACE) 100 MG capsule Take 100 mg by mouth 2 (two) times daily.   10/30/2016 at Unknown time  . escitalopram (LEXAPRO) 20 MG tablet Take 20 mg by mouth daily.    10/30/2016 at Unknown time  . hydrOXYzine (ATARAX/VISTARIL) 25 MG tablet Take 25 mg by mouth every 6 (six) hours as needed for itching.    Past Month at Unknown time  . lanthanum (FOSRENOL) 500 MG chewable tablet Chew 1 tablet (500 mg total) by mouth 3 (three) times daily  with meals. 30 tablet 3 Past Month at Unknown time  . levothyroxine (SYNTHROID, LEVOTHROID) 100 MCG tablet Take 100 mcg by mouth daily.    11/14/2016 at Unknown time  . loperamide (IMODIUM) 2 MG capsule Take 2 mg by mouth 4 (four) times daily as needed for diarrhea or loose stools.    Past Month at Unknown time  . lovastatin (MEVACOR) 20 MG tablet Take 20 mg by mouth at bedtime.    10/30/2016 at Unknown time  . metoprolol succinate (TOPROL-XL) 50 MG 24 hr tablet Take 1 tablet (50 mg total) by mouth 2 (two) times daily. Take with or immediately following a meal. 60 tablet 1 10/30/2016 at 1600  . nortriptyline (PAMELOR) 25 MG capsule Take 25 mg by mouth at bedtime.   10/30/2016 at Unknown time  . oxyCODONE-acetaminophen (PERCOCET/ROXICET) 5-325 MG tablet Take 1 tablet by mouth 2 (two) times daily.   10/30/2016 at Unknown time  . predniSONE (DELTASONE) 20 MG tablet Take 20 mg by mouth 3 (three) times daily.   10/30/2016 at Unknown time  . senna-docusate (SENOKOT-S) 8.6-50 MG tablet Take 2 tablets by mouth 2 (two) times daily.   10/30/2016 at Unknown time  . valsartan (DIOVAN) 80 MG tablet Take 80 mg by mouth daily.   10/30/2016 at Unknown time  . isosorbide mononitrate (IMDUR) 30 MG 24 hr tablet Take 1 tablet (30 mg total) by mouth daily. (Patient not taking: Reported on 11/10/2016) 30 tablet 1 Not Taking at Unknown time  . [DISCONTINUED] ALPRAZolam (XANAX) 0.25 MG tablet Take 1 tablet (0.25 mg total) by mouth 3 (three) times daily as needed for anxiety. (Patient not taking: Reported on 11/01/2016) 10 tablet 0 Not Taking at Unknown time    Assessment: 71 y/o female with ESRD on HD MWF admitted with AMS and lethargy. Pharmacy consulted to begin IV heparin for chest pain with elevated troponin. No bleeding noted, Hgb high at 15.9, platelets were clumped and are unavailable (normal in 08/2016).   Goal of Therapy:  Heparin level 0.3-0.7 units/ml Monitor platelets by anticoagulation protocol: Yes   Plan:  - Heparin 4000  units IV bolus then infuse at 900 units/hr - 8 he heparin level - Daily heparin level and CBC - Monitor for s/sx of bleeding   Loura BackJennifer [place], PharmD, BCPS Clinical Pharmacist Phone for tonight [phone] Main pharmacy - [phone] 11/07/2016 7:28 PM

## 2016-10-31 NOTE — H&P (Signed)
Date: 11/15/2016               Patient Name:  [name] MRN: [id number]  DOB: [birth year] Age / Sex: 71 y.o., female   PCP: Janifer Adie, MD         Medical Service: Internal Medicine Teaching Service         Attending Physician: Dr. Aldine Contes, MD    First Contact: Dr. Jari Favre Pager: [phone]  Second Contact: Dr. Juleen China Pager: [phone]       After Hours (After 5p/  First Contact Pager: [phone]  weekends / holidays): Second Contact Pager: [phone]   Chief Complaint: weakness  History of Present Illness:  [name] is a 71yo female with PMH of CAD s/p MI x2 and stents x4, ESRD on HD MWF, HTN, HLD, pulmonary hypertension, anemia of chronic disease, T2DM, combined CHF, PAD s/p left BKA presenting to [facility] from dialysis due to increased weakness and somnolence. Patient resides at [facility] and was taken to her regularly scheduled HD session, however the HD nurses found her to be too sedated to begin on dialysis so she was transferred to [facility]. Per HD center RN who spoke with Dr. Lorrene Reid with nephro, they have noticed that in the last week or so the patient has been more unkempt and increasingly fatigued at each dialysis session (she regularly interacts normally and will put on makeup to go to dialysis). Patient is intermittently answering questions and endorsed transient chest pain during our evaluation that resolved without intervention within minutes. She denies abdominal pain, shortness of breath. She could not provide [place] with further information.   Meds:  Current Meds  Medication Sig  . acetaminophen (TYLENOL) 325 MG tablet Take 650 mg by mouth every 6 (six) hours as needed for mild pain.  Marland Kitchen ALPRAZolam (XANAX) 0.5 MG tablet Take 0.5 mg by mouth 3 (three) times daily.  . calcium carbonate (TUMS - DOSED IN MG ELEMENTAL CALCIUM) 500 MG chewable tablet Chew 3 tablets by mouth 3 (three) times daily.   . cetirizine (ZYRTEC) 10 MG tablet Take 10 mg by mouth daily.  .  clonazePAM (KLONOPIN) 1 MG tablet Take 1 mg by mouth 2 (two) times daily.  . diphenhydrAMINE (BENADRYL) 50 MG/ML injection Inject 100 mg into the vein 3 (three) times daily as needed for itching.  . docusate sodium (COLACE) 100 MG capsule Take 100 mg by mouth 2 (two) times daily.  Marland Kitchen escitalopram (LEXAPRO) 20 MG tablet Take 20 mg by mouth daily.   . hydrOXYzine (ATARAX/VISTARIL) 25 MG tablet Take 25 mg by mouth every 6 (six) hours as needed for itching.   Marland Kitchen lanthanum (FOSRENOL) 500 MG chewable tablet Chew 1 tablet (500 mg total) by mouth 3 (three) times daily with meals.  Marland Kitchen levothyroxine (SYNTHROID, LEVOTHROID) 100 MCG tablet Take 100 mcg by mouth daily.   Marland Kitchen loperamide (IMODIUM) 2 MG capsule Take 2 mg by mouth 4 (four) times daily as needed for diarrhea or loose stools.   . lovastatin (MEVACOR) 20 MG tablet Take 20 mg by mouth at bedtime.   . metoprolol succinate (TOPROL-XL) 50 MG 24 hr tablet Take 1 tablet (50 mg total) by mouth 2 (two) times daily. Take with or immediately following a meal.  . nortriptyline (PAMELOR) 25 MG capsule Take 25 mg by mouth at bedtime.  Marland Kitchen oxyCODONE-acetaminophen (PERCOCET/ROXICET) 5-325 MG tablet Take 1 tablet by mouth 2 (two) times daily.  . predniSONE (DELTASONE) 20 MG tablet Take 20  mg by mouth 3 (three) times daily.  Marland Kitchen senna-docusate (SENOKOT-S) 8.6-50 MG tablet Take 2 tablets by mouth 2 (two) times daily.  . valsartan (DIOVAN) 80 MG tablet Take 80 mg by mouth daily.   Allergies: Allergies as of 11/22/2016 - Review Complete 11/12/2016  Allergen Reaction Noted  . Morphine and related Other (See Comments) 06/07/2016  . Other Anaphylaxis 04/08/2011  . Penicillins Swelling 04/08/2011  . Pumpkin seed oil-saw palmetto-zinc [propalmex] Anaphylaxis 04/08/2011  . Shrimp [shellfish allergy] Anaphylaxis 04/08/2011  . Food Other (See Comments) 11/01/2016  . Lisinopril Other (See Comments) 10/30/2016   Past Medical History:  Diagnosis Date  . Anxiety   . Asthma   .  CAD (coronary artery disease)    s/p MI x 2, s/p stent x 4 (05/2005 with DES placed at that time)  . Carpal tunnel syndrome, bilateral   . Chronic systolic congestive heart failure ([place])    EF 35-40% per 2D echo (03/2011)  . Constipation   . Diabetes mellitus type 2, controlled, with complications ([place])    diet controlled  . Diabetic retinopathy   . ESRD (end stage renal disease) on dialysis [facility]) HD since 07/2008   [facility]; Mon, [place], Friday  . Fibromyalgia   . Hemorrhoids   . Hyperlipidemia   . Hypertension   . Hypothyroid   . Major depression   . Migraines   . Morphea   . Normocytic anemia    BL Hgb 10-12  //  Chronic, likely multifactorial AOCD and possible iron deficiency component with transferrin sat < 20 historically.  . Osteoporosis   . PAD (peripheral artery disease) (HCC)    s/p L BKA  . Peripheral neuropathy   . PONV (postoperative nausea and vomiting)   . Pulmonary hypertension (HCC)    PA Peak pressure 57 mmHg per 2D echo (03/2011)  . Refusal of blood transfusions as patient is Jehovah's Witness   . Secondary hyperparathyroidism ([place])   . Vitamin D deficiency     Family History: Mother with MM, T2DM, HTN; father with alcohol abuse  Social History: Former smoker; previously denied significant alcohol use or illicit drug use  Review of Systems: A complete ROS was negative except as per HPI. ROS was limited due to patient somnolence and inability to fully participate in interview.  Physical Exam: Blood pressure (!) 99/56, pulse 76, temperature 97.9 F (36.6 C), temperature source Oral, resp. rate 14, SpO2 93 %. General: somnolent but awakens easily  Head: normocephalic and atraumatic.  Eyes: vision grossly intact, pupils equal, pupils round, pupils sluggish but reactive to light, no injection and anicteric.  Mouth: pharynx pink and moist, no erythema, and no exudates.  Neck: supple, full ROM, no thyromegaly. +JVD  Lungs: normal respiratory effort, no  accessory muscle use, normal breath sounds, no crackles, and no wheezes anteriorly. Heart: normal rate, regular rhythm, no murmur, no gallop, and no rub.  Abdomen: soft, no distention, no guarding, no rebound tenderness. Decreased bowel sounds, mild tenderness to palpation in RUQ  Msk: no joint swelling, no joint warmth, and no redness over joints. S/p left BKA. Tenderness over palpation of left thigh; pain with movement of left knee but no point tenderness, redness, or effusion. Pulses: 2+ DP/PT pulses bilaterally Extremities: No cyanosis, clubbing; ecchymosis over right foot and left breast; 2+ pitting edema in LE Neurologic: alert & oriented X2, unable to fully evaluate neurological status but she was able to follow commands and move all 4 extremities, sensation appeared intact, strength  decreased in all extremities.   Skin: turgor normal and no rashes.  Psych: Oriented X3, memory intact for recent and remote, normally interactive, good eye contact, not anxious appearing, and not depressed appearing  LABS: WBC 8.1 (adjusted for nucleated RBCs), Hgb 15.9, Hct 48.9, MCV 105.4, Plts - clumped unable to estimate Na 134, K 5.1, Cl 91, CO2 19, BUN 62, Cr 5.5, glu 100 Tot protein 5.8, Alb 3.6, Ast 229, Alt 175, Alk phos 88, tot bili 4.4 PT 19.1; INR 1.59 Lipase 33 LA 4.88 Acetaminophen <10 VBG 7.385/36/48/21 iStat trop 0.8, trop I 1.0  EKG: Personally reviewed - at presentation sinus rhythm, prolonged QTc (500), T wave inversions in inferolateral leads compared to prior; f/u EKG sinus rhythm, continued T wave inversion in lead III, biphasic T wave in aVF, prolonged QTc (490)  CXR: Personally reviewed - poor inspiratory effort, large gastric bubble, patient rotated to the right with shift of cardiac silhouette, vascular congestion  Assessment & Plan by Problem: Active Problems:   Generalized weakness  NSTEMI: Patient with known CAD with complaints of chest pain and elevated troponin, without  ST elevation found on EKG. Patient had heart cath 09/15/2016 which showed patent stents in LAD with mild stent restenosis without flow limiting lesions in LAD; mod non-obstructive disease in circumflex; chronic total occlusion of mid RCA with collaterals. At the time patient was to be continued on medical management and continued on her ASA and clopidogrel. MAR from [place] today does not show ASA and clopidogrel on list of medications she is currently taking; it's uncertain when and by whom this change was made or why.  --trend troponins --ASA --heparin gtt --echo --AM EKG --consult cards in AM  Generalized weakness Lethargy: Patient initially brought in for generalized weakness and lethargy. Per HD RN report, patient was recently increased on her chronic pain medicine. Her MAR lists scheduled Norco, Xanax, Klonopin, nortriptyline, with PRN xanax, benadryl, and hydroxyzine. On arrival her lactic acid was elevated to 4.8, however, she was and remains afebrile and without a leukocytosis. Etiology could be from polypharmacy, infection (though no other signs at this time), or cardiac (MI +/- worsening heart failure). --hold sedating meds for now --trend LA --f/u AM CBC  Transaminitis, hyperbilirubinemia: CT abd/pel in Jan 2018 shows a stable mildly prominent biliary tree but no hepatic or gallbladder abnormality. Her transaminitis has been present and worsening since April 2018. HIV, HCV and HBV screen negative within last 6 months. --RUQ U/S --f/u AM CMet  ESRD on HD MWF: Renal consulted - appreciate their assistance. Per renal notes in HD, her dry weight was recently decreased due to weight loss to 76kgs but patient has not reached it or previous dry weight yet. HD today without net neg fluid. --renal on board --f/u renal panel in AM  Hypertension Combined CHF: Patient with last Echo 08/2016 showing EF 40-45% with G2DD. Her home regimen includes metoprolol succinate and valsartan. Patient  with relative low BP (per HD notes normally 150s-170s). She was initially low-normal on presentation. --hold metoprolol and valsartan today; may restart tomorrow  Hypothyroidism: Patient continued on home levothyroxine 137mg daily.  T2DM: Per prior notes is diet controlled.  Dispo: Admit patient to Inpatient with expected length of stay greater than 2 midnights.  Signed: SAlphonzo Grieve MD 11/15/2016, 5:36 PM  Pager [phone]

## 2016-10-31 NOTE — Consult Note (Signed)
[facility] Renal Consultation Note    Indication for Consultation:  Management of ESRD/hemodialysis; anemia, hypertension/volume and secondary hyperparathyroidism  HPI: [name] is a 71 y.o. female with ESRD on HD MWF at [facility]. PMH DM, CAD s/p stenting, chronic systolic HF (Echo 0/1751  40-45%), L BKA, L hip fracture with ORIF 05/2016.   She was transferred to ED this am from [facility] where she resides. Her living facility felt she was too lethargic to attend her scheduled dialysis this am. In the ED patient reminded lethargic, Vitals stable SPO2 94-96% on RA. Labs significant for CO2 19,  Lactic acid 4.88 with anion gap 24, Ammonia 39 and elevated AST/ALT, elevated bilirubin 4.4. CXR mild atelectasis over R base, no overt edema or infiltrate. Initial concern for narcotic sedation, per ED note patient's doses of pain meds increased by PMD last week. Current meds include oxycodone, Xanax, Klonopin, and nortriptyline. Patient to be admitted for further evaluation.  Seen in ED and remains lethargic, sluggish to respond to questions with eyes closed. Unable to obtain clear history. Grimaces with pain when any part of body is touched, but can't identify specific source of pain. Has rash on chest, unclear onset but endorses itchiness.  Her last HD session was Wed 6/6  For 2:55. Usually completes full treatments and no recent missed sessions. Dialysis center RN reports patient has been alert and oriented during HD treatments, though has been getting progressive weaker and requiring more assistance to transfer.  Past Medical History:  Diagnosis Date  . Anxiety   . Asthma   . CAD (coronary artery disease)    s/p MI x 2, s/p stent x 4 (05/2005 with DES placed at that time)  . Carpal tunnel syndrome, bilateral   . Chronic systolic congestive heart failure ([place])    EF 35-40% per 2D echo (03/2011)  . Constipation   . Diabetes mellitus type 2, controlled, with  complications ([place])    diet controlled  . Diabetic retinopathy   . ESRD (end stage renal disease) on dialysis [facility]) HD since 07/2008   [facility]; Mon, [place], Friday  . Fibromyalgia   . Hemorrhoids   . Hyperlipidemia   . Hypertension   . Hypothyroid   . Major depression   . Migraines   . Morphea   . Normocytic anemia    BL Hgb 10-12  //  Chronic, likely multifactorial AOCD and possible iron deficiency component with transferrin sat < 20 historically.  . Osteoporosis   . PAD (peripheral artery disease) (HCC)    s/p L BKA  . Peripheral neuropathy   . PONV (postoperative nausea and vomiting)   . Pulmonary hypertension (H[facility])    PA Peak pressure 57 mmHg per 2D echo (03/2011)  . Refusal of blood transfusions as patient is Jehovah's Witness   . Secondary hyperparathyroidism ([place])   . Vitamin D deficiency    Past Surgical History:  Procedure Laterality Date  . AV FISTULA PLACEMENT  ~ 2011   right upper arm  . BYPASS GRAFT  2006  . CARDIAC CATHETERIZATION  2006, 2007   pt had 4 stents placed  . CATARACT EXTRACTION, BILATERAL Bilateral 2000  . CHOLECYSTECTOMY  1987  . HARDWARE REMOVAL Left 07/03/2016   Procedure: LEFT LEG HARDWARE REMOVAL, REMOVAL OF PINS;  Surgeon: Meredith Pel, MD;  Location: [place];  Service: Orthopedics;  Laterality: Left;  . HEMIARTHROPLASTY HIP Left 10/2009  . JOINT REPLACEMENT  2007  Partial placement Left Hip  . LEFT HEART CATH AND CORONARY ANGIOGRAPHY N/A 09/15/2016   Procedure: Left Heart Cath and Coronary Angiography;  Surgeon: Burnell Blanks, MD;  Location: [place] CV [facility];  Service: Cardiovascular;  Laterality: N/A;  . LEG AMPUTATION BELOW KNEE Left 10/2008   left  . ORIF FEMUR FRACTURE Left 06/09/2016   Procedure: CLOSED REDUCTION,  CANNULATED SCREWS PINNING;  Surgeon: Meredith Pel, MD;  Location: [place];  Service: Orthopedics;  Laterality: Left;  . PERIPHERAL ARTERIAL STENT GRAFT Bilateral    bilaterally "(left ~ 2010; right ~ 2011"   . VAGINAL HYSTERECTOMY  1994   with BSO   Family History  Problem Relation Age of Onset  . Alcohol abuse Father   . Multiple myeloma Mother   . Diabetes type II Mother   . Hypertension Mother   . Cancer Mother   . Diabetes Mother   . Appendicitis Brother    Social History:  reports that she quit smoking about 27 years ago. Her smoking use included Cigarettes. She has a 60.00 pack-year smoking history. She has never used smokeless tobacco. She reports that she does not drink alcohol or use drugs. Allergies  Allergen Reactions  . Morphine And Related Other (See Comments)    Pt fell into deep sleep and could not be woken up had to be resuscitated says she did not overdose, unsure whether throat swelled or SOB pt does not remember  . Other Anaphylaxis    PEACHES  . Penicillins Swelling    Tongue swelling.   Marland Kitchen Pumpkin Seed Oil-Saw Palmetto-Zinc [Propalmex] Anaphylaxis  . Shrimp [Shellfish Allergy] Anaphylaxis  . Food Other (See Comments)    Nectarine, Pumpkin  . Lisinopril Other (See Comments)    unknown   Prior to Admission medications   Medication Sig Start Date End Date Taking? Authorizing Provider  acetaminophen (TYLENOL) 325 MG tablet Take 650 mg by mouth every 6 (six) hours as needed for mild pain.   Yes [provider]  ALPRAZolam Duanne Moron) 0.5 MG tablet Take 0.5 mg by mouth 3 (three) times daily.   Yes [provider]  calcium carbonate (TUMS - DOSED IN MG ELEMENTAL CALCIUM) 500 MG chewable tablet Chew 3 tablets by mouth 3 (three) times daily.    Yes [provider]  cetirizine (ZYRTEC) 10 MG tablet Take 10 mg by mouth daily.   Yes [provider]  clonazePAM (KLONOPIN) 1 MG tablet Take 1 mg by mouth 2 (two) times daily.   Yes [provider]  diphenhydrAMINE (BENADRYL) 50 MG/ML injection Inject 100 mg into the vein 3 (three) times daily as needed for itching.   Yes [provider]  docusate sodium (COLACE) 100 MG capsule  Take 100 mg by mouth 2 (two) times daily.   Yes [provider]  escitalopram (LEXAPRO) 20 MG tablet Take 20 mg by mouth daily.    Yes [provider]  hydrOXYzine (ATARAX/VISTARIL) 25 MG tablet Take 25 mg by mouth every 6 (six) hours as needed for itching.    Yes [provider]  lanthanum (FOSRENOL) 500 MG chewable tablet Chew 1 tablet (500 mg total) by mouth 3 (three) times daily with meals. 06/12/16  Yes Asencion Partridge, MD  levothyroxine (SYNTHROID, LEVOTHROID) 100 MCG tablet Take 100 mcg by mouth daily.    Yes [provider]  loperamide (IMODIUM) 2 MG capsule Take 2 mg by mouth 4 (four) times daily as needed for diarrhea or loose stools.  Yes [provider]  lovastatin (MEVACOR) 20 MG tablet Take 20 mg by mouth at bedtime.    Yes [provider]  metoprolol succinate (TOPROL-XL) 50 MG 24 hr tablet Take 1 tablet (50 mg total) by mouth 2 (two) times daily. Take with or immediately following a meal. 09/17/16  Yes Reyne Dumas, MD  nortriptyline (PAMELOR) 25 MG capsule Take 25 mg by mouth at bedtime.   Yes [provider]  oxyCODONE-acetaminophen (PERCOCET/ROXICET) 5-325 MG tablet Take 1 tablet by mouth 2 (two) times daily.   Yes [provider]  predniSONE (DELTASONE) 20 MG tablet Take 20 mg by mouth 3 (three) times daily.   Yes [provider]  senna-docusate (SENOKOT-S) 8.6-50 MG tablet Take 2 tablets by mouth 2 (two) times daily.   Yes [provider]  valsartan (DIOVAN) 80 MG tablet Take 80 mg by mouth daily.   Yes [provider]  ALPRAZolam (XANAX) 0.25 MG tablet Take 1 tablet (0.25 mg total) by mouth 3 (three) times daily as needed for anxiety. Patient not taking: Reported on 10/27/2016 09/17/16   Reyne Dumas, MD  isosorbide mononitrate (IMDUR) 30 MG 24 hr tablet Take 1 tablet (30 mg total) by mouth daily. Patient not taking: Reported on 11/07/2016 09/17/16   Reyne Dumas, MD   No current  facility-administered medications for this encounter.    Current Outpatient Prescriptions  Medication Sig Dispense Refill  . acetaminophen (TYLENOL) 325 MG tablet Take 650 mg by mouth every 6 (six) hours as needed for mild pain.    Marland Kitchen ALPRAZolam (XANAX) 0.5 MG tablet Take 0.5 mg by mouth 3 (three) times daily.    . calcium carbonate (TUMS - DOSED IN MG ELEMENTAL CALCIUM) 500 MG chewable tablet Chew 3 tablets by mouth 3 (three) times daily.     . cetirizine (ZYRTEC) 10 MG tablet Take 10 mg by mouth daily.    . clonazePAM (KLONOPIN) 1 MG tablet Take 1 mg by mouth 2 (two) times daily.    . diphenhydrAMINE (BENADRYL) 50 MG/ML injection Inject 100 mg into the vein 3 (three) times daily as needed for itching.    . docusate sodium (COLACE) 100 MG capsule Take 100 mg by mouth 2 (two) times daily.    Marland Kitchen escitalopram (LEXAPRO) 20 MG tablet Take 20 mg by mouth daily.     . hydrOXYzine (ATARAX/VISTARIL) 25 MG tablet Take 25 mg by mouth every 6 (six) hours as needed for itching.     Marland Kitchen lanthanum (FOSRENOL) 500 MG chewable tablet Chew 1 tablet (500 mg total) by mouth 3 (three) times daily with meals. 30 tablet 3  . levothyroxine (SYNTHROID, LEVOTHROID) 100 MCG tablet Take 100 mcg by mouth daily.     Marland Kitchen loperamide (IMODIUM) 2 MG capsule Take 2 mg by mouth 4 (four) times daily as needed for diarrhea or loose stools.     . lovastatin (MEVACOR) 20 MG tablet Take 20 mg by mouth at bedtime.     . metoprolol succinate (TOPROL-XL) 50 MG 24 hr tablet Take 1 tablet (50 mg total) by mouth 2 (two) times daily. Take with or immediately following a meal. 60 tablet 1  . nortriptyline (PAMELOR) 25 MG capsule Take 25 mg by mouth at bedtime.    Marland Kitchen oxyCODONE-acetaminophen (PERCOCET/ROXICET) 5-325 MG tablet Take 1 tablet by mouth 2 (two) times daily.    . predniSONE (DELTASONE) 20 MG tablet Take 20 mg by mouth 3 (three) times daily.    Marland Kitchen senna-docusate (SENOKOT-S) 8.6-50 MG  tablet Take 2 tablets by mouth 2 (two) times daily.    .  valsartan (DIOVAN) 80 MG tablet Take 80 mg by mouth daily.    Marland Kitchen ALPRAZolam (XANAX) 0.25 MG tablet Take 1 tablet (0.25 mg total) by mouth 3 (three) times daily as needed for anxiety. (Patient not taking: Reported on 11/20/2016) 10 tablet 0  . isosorbide mononitrate (IMDUR) 30 MG 24 hr tablet Take 1 tablet (30 mg total) by mouth daily. (Patient not taking: Reported on 11/05/2016) 30 tablet 1    ROS: As per HPI otherwise negative.  Physical Exam: Vitals:   10/27/2016 1330 11/17/2016 1345 11/10/2016 1400 11/13/2016 1600  BP: 107/67  117/62 (!) 99/56  Pulse: 75 74 76 76  Resp: _0 Temp:      TempSrc:      SpO2: 95% 93% 96% 93%     General: Elderly female slumped over in bed, unable to move, ill-appearing Head: NCAT sclera not icteric MMM Neck: Supple. No masses  Lungs:  Breathing is unlabored. Diminished at R base other wise CTA Heart: RRR with S1 S2 Abdomen: soft + BS Lower extremities: L BKA with trace edema/R extremity with 2+ edema/bruising around foot  Neuro: Lethargic, unable to move extremities Psych:  Sluggish, delayed responses to questions Skin: erythematous discrete vascular lesions to chest, left breast   Bruising of foot (post trauma at SNF) Dialysis Access: RUE AVF +bruit   Labs: Basic Metabolic Panel:  Recent Labs Lab 11/01/2016 1249  NA 134*  K 5.1  CL 91*  CO2 19*  GLUCOSE 100*  BUN 62*  CREATININE 5.49*  CALCIUM 8.6*    Recent Labs Lab 11/04/2016 1249  AST 229*  ALT 175*  ALKPHOS 88  BILITOT 4.4*  PROT 5.8*  ALBUMIN 3.6    Recent Labs Lab 10/29/2016 1249  LIPASE 33    Recent Labs Lab 10/24/2016 1249  AMMONIA 39*     Recent Labs Lab 10/30/2016 1145  WBC 8.1  NEUTROABS 7.2  HGB 15.9*  HCT 48.9*  MCV 105.4*  PLT PLATELET CLUMPS NOTED ON SMEAR, UNABLE TO ESTIMATE     Recent Labs Lab 11/17/2016 1058  GLUCAP 96   Studies/Results: Dg Chest 2 View  Result Date: 10/28/2016 CLINICAL DATA:  Generalized weakness. EXAM: CHEST  2 VIEW  COMPARISON:  September 12, 2016 FINDINGS: The study is limited by low lung volumes. The cardiomediastinal silhouette is stable. There is a torturous thoracic aorta. The hila remain prominent but less so in the interval. No pulmonary nodules or masses. Mild atelectasis in the right base. No focal infiltrate or overt edema. IMPRESSION: No interval change or acute abnormality. Electronically Signed   By: Dorise Bullion III M.D   On: 11/15/2016 10:34    Dialysis Orders:  [facility] MWF 3.75h 180 F BFR 400/A1.5x  Na Linear 2K/2.25 Ca EDW 76 kg  Heparin 8500 U Bolus  Hectorol 3 mcg IV q HD Recent Outpt Labs: Hgb 14.0 Ca 8.7 Alb 3.6 Ph 5.8 iPTH 549   Assessment/Plan: 1. AMS/Lethargy - unclear etiology - reported alert & oriented at last HD session on 6/6 - pt on multiple sedating medications ? polypharmacy vs metabolic disturbance - elevated lactic acid and ammonia  - waxing and waning mental status. w/u per primary  2. Elevated liver enzymes - AST 229 ALT 175 Bilirubin 4.4. Progressive worsening since April 2018. No cross sectional imaging since 05/2016. Consider CT abd. Hep B SAg neg 10/15/16.  3. Chest pain w/elevated troponin.  Known CAD. ECG no acute changes. Cycle troponins.  4. Lactic acidosis. Eval for sepsis. Blood cultures done. Empiric coverage per IM team.  5.  ESRD -  MWF - Plan HD today on schedule K 5.1  6.  Hypertension/volume  - BP lower than usual on admission /ARB on outpt list/ some volume excess on exam last outpt weight 77kg - UF goal 2L Relative hypotension - unlikely to get any volume with HD today. 7.  Anemia  - Hgb 15.9 No ESA needs currently  8.  Metabolic bone disease -  Continue Hectorol/ Fosrenol binder when eating  9.  Nutrition - Renal diet as tolerated /fluid restriction  10. Vascular rash - on prednisone 60 mg No details on this. Most prominent on chest. Per MAR steroids started 6/1.  11. DM - per primary   Lynnda Child PA-C [place], [place] Pager  [phone] 11/21/2016, 4:05 PM   I have seen and examined this patient and agree with plan and assessment in the above note with renal recommendations/intervention highlighted.   , B,MD 10/27/2016 6:35 PM

## 2016-10-31 NOTE — ED Notes (Signed)
Patient is a documented Jehovah's Witness.

## 2016-10-31 NOTE — ED Provider Notes (Signed)
[facility] DEPT Provider Note   CSN: [id number] Arrival date & time: 11/21/2016  0919     History   Chief Complaint Chief Complaint  Patient presents with  . Fatigue    HPI [name] is a 71 y.o. female.  The history is provided by the patient, the nursing home and the EMS personnel. The history is limited by the condition of the patient. No language interpreter was used.  Weakness  Primary symptoms include no focal weakness. This is a new problem. The problem has not changed since onset.There has been no fever.    Past Medical History:  Diagnosis Date  . Anxiety   . Asthma   . CAD (coronary artery disease)    s/p MI x 2, s/p stent x 4 (05/2005 with DES placed at that time)  . Carpal tunnel syndrome, bilateral   . Chronic systolic congestive heart failure ([place])    EF 35-40% per 2D echo (03/2011)  . Constipation   . Diabetes mellitus type 2, controlled, with complications ([place])    diet controlled  . Diabetic retinopathy   . ESRD (end stage renal disease) on dialysis [facility]) HD since 07/2008   [facility]; Mon, [place], Friday  . Fibromyalgia   . Hemorrhoids   . Hyperlipidemia   . Hypertension   . Hypothyroid   . Major depression   . Migraines   . Morphea   . Normocytic anemia    BL Hgb 10-12  //  Chronic, likely multifactorial AOCD and possible iron deficiency component with transferrin sat < 20 historically.  . Osteoporosis   . PAD (peripheral artery disease) (HCC)    s/p L BKA  . Peripheral neuropathy   . PONV (postoperative nausea and vomiting)   . Pulmonary hypertension (HCC)    PA Peak pressure 57 mmHg per 2D echo (03/2011)  . Refusal of blood transfusions as patient is Jehovah's Witness   . Secondary hyperparathyroidism ([place])   . Vitamin D deficiency     Patient Active Problem List   Diagnosis Date Noted  . Non-ST elevation (NSTEMI) myocardial infarction ([place])   . Unstable angina ([place])   . Hypertensive urgency 09/12/2016  . Dyspnea 09/12/2016  .  Chest pain 09/12/2016  . Hx of BKA, right ([place]) 07/30/2016  . Closed displaced fracture of condyle of left femur with routine healing 07/10/2016  . Retained orthopedic hardware   . Hx of BKA, left ([place]) 06/18/2016  . Nontraumatic psoas hematoma   . Anemia   . Closed bicondylar fracture of distal femur, left, initial encounter ([place]) 06/09/2016  . Closed left femoral fracture ([place]) 06/08/2016  . Compression fracture of L4 lumbar vertebra ([place]) 06/07/2016  . Acute respiratory failure ([place]) 08/09/2012  . Altered mental status 08/09/2012  . Narcotic overdose 08/09/2012  . Hyperkalemia 08/09/2012  . Chronic total occlusion of artery of the extremities ([place]) 10/16/2011  . Peripheral vascular disease, unspecified ([place]) 10/16/2011  . ESRD (end stage renal disease) ([place]) 04/08/2011  . Diabetes mellitus type II 04/08/2011  . Diabetic retinopathy associated with type 2 diabetes mellitus ([place]) 04/08/2011  . GERD (gastroesophageal reflux disease) 04/08/2011  . CAD, multiple vessel 04/08/2011  . PAD (peripheral artery disease) ([place]) 04/08/2011  . CHF (congestive heart failure) ([place]) 04/08/2011  . Hypertension 04/08/2011  . Fibromyalgia 04/08/2011  . Hypothyroid 04/08/2011  . Anxiety 04/08/2011  . Major depression 04/08/2011  . Peripheral neuropathy 04/08/2011  . Morphea 04/08/2011  . Osteoporosis 04/08/2011  . Carpal tunnel syndrome on both  sides 04/08/2011  . Refusal of blood transfusions as patient is Jehovah's Witness 04/08/2011  . Hyperparathyroidism, secondary ([place]) 04/08/2011  . Vitamin D deficiency 04/08/2011    Past Surgical History:  Procedure Laterality Date  . AV FISTULA PLACEMENT  ~ 2011   right upper arm  . BYPASS GRAFT  2006  . CARDIAC CATHETERIZATION  2006, 2007   pt had 4 stents placed  . CATARACT EXTRACTION, BILATERAL Bilateral 2000  . CHOLECYSTECTOMY  1987  . HARDWARE REMOVAL Left 07/03/2016   Procedure: LEFT LEG HARDWARE REMOVAL, REMOVAL OF PINS;  Surgeon: Meredith Pel, MD;  [facility]: [facility];  Service: Orthopedics;  Laterality: Left;  . HEMIARTHROPLASTY HIP Left 10/2009  . JOINT REPLACEMENT  2007   Partial placement Left Hip  . LEFT HEART CATH AND CORONARY ANGIOGRAPHY N/A 09/15/2016   Procedure: Left Heart Cath and Coronary Angiography;  Surgeon: Burnell Blanks, MD;  Location: [facility];  Service: Cardiovascular;  Laterality: N/A;  . LEG AMPUTATION BELOW KNEE Left 10/2008   left  . ORIF FEMUR FRACTURE Left 06/09/2016   Procedure: CLOSED REDUCTION,  CANNULATED SCREWS PINNING;  Surgeon: Meredith Pel, MD;  Location: [facility];  Service: Orthopedics;  Laterality: Left;  . PERIPHERAL ARTERIAL STENT GRAFT Bilateral    bilaterally "(left ~ 2010; right ~ 2011"  . VAGINAL HYSTERECTOMY  1994   with BSO    OB History    No data available       Home Medications    Prior to Admission medications   Medication Sig Start Date End Date Taking? Authorizing Provider  acetaminophen (TYLENOL) 325 MG tablet Take 650 mg by mouth every 6 (six) hours as needed for mild pain.    [provider]  ALPRAZolam Duanne Moron) 0.25 MG tablet Take 1 tablet (0.25 mg total) by mouth 3 (three) times daily as needed for anxiety. 09/17/16   Reyne Dumas, MD  aspirin 81 MG chewable tablet Chew 81 mg by mouth every 6 (six) hours as needed for mild pain.     [provider]  calcium carbonate (TUMS - DOSED IN MG ELEMENTAL CALCIUM) 500 MG chewable tablet Chew 3 tablets by mouth 3 (three) times daily.     [provider]  docusate sodium (COLACE) 100 MG capsule Take 100 mg by mouth 2 (two) times daily.    [provider]  escitalopram (LEXAPRO) 20 MG tablet Take 30 mg by mouth daily.     [provider]  hydrOXYzine (ATARAX/VISTARIL) 25 MG tablet Take 25 mg by mouth every 6 (six) hours as needed for itching.     [provider]  isosorbide mononitrate (IMDUR) 30 MG 24 hr tablet Take 1 tablet (30 mg total) by mouth  daily. 09/17/16   Reyne Dumas, MD  lanthanum (FOSRENOL) 500 MG chewable tablet Chew 1 tablet (500 mg total) by mouth 3 (three) times daily with meals. 06/12/16   Asencion Partridge, MD  levothyroxine (SYNTHROID, LEVOTHROID) 100 MCG tablet Take 100 mcg by mouth daily.     [provider]  loperamide (IMODIUM) 2 MG capsule Take 2 mg by mouth 4 (four) times daily as needed for diarrhea or loose stools.     [provider]  lovastatin (MEVACOR) 20 MG tablet Take 20 mg by mouth at bedtime.     [provider]  metoprolol succinate (TOPROL-XL) 50 MG 24 hr tablet Take 1 tablet (50 mg total) by mouth 2 (two) times daily. Take with or immediately  following a meal. 09/17/16   Reyne Dumas, MD  nortriptyline (PAMELOR) 25 MG capsule Take 25 mg by mouth at bedtime.    [provider]  oxycodone (OXY-IR) 5 MG capsule Take 5 mg by mouth every 12 (twelve) hours as needed for pain.    [provider]  senna-docusate (SENOKOT-S) 8.6-50 MG tablet Take 2 tablets by mouth 2 (two) times daily.    [provider]  valsartan (DIOVAN) 80 MG tablet Take 80 mg by mouth daily.    [provider]    Family History Family History  Problem Relation Age of Onset  . Alcohol abuse Father   . Multiple myeloma Mother   . Diabetes type II Mother   . Hypertension Mother   . Cancer Mother   . Diabetes Mother   . Appendicitis Brother     Social History Social History  Substance Use Topics  . Smoking status: Former Smoker    Packs/day: 2.00    Years: 30.00    Types: Cigarettes    Quit date: 04/07/1989  . Smokeless tobacco: Never Used  . Alcohol use No     Comment: "socially; I'm not an alcoholic"     Allergies   Morphine and related; Other; Penicillins; Pumpkin seed oil-saw palmetto-zinc [propalmex]; Shrimp [shellfish allergy]; Food; and Lisinopril   Review of Systems Review of Systems  Unable to perform ROS: Patient unresponsive  Neurological: Positive  for weakness. Negative for focal weakness.     Physical Exam Updated Vital Signs BP 106/88   Pulse 73   Temp 97.9 F (36.6 C) (Oral)   Resp 14   SpO2 96%   Physical Exam  Constitutional: She appears well-developed and well-nourished. No distress.  HENT:  Head: Normocephalic and atraumatic.  Eyes: Conjunctivae are normal.  Neck: Neck supple.  Cardiovascular: Normal rate and regular rhythm.   No murmur heard. Pulmonary/Chest: Effort normal and breath sounds normal. No respiratory distress.  Abdominal: Soft. There is no tenderness.  Musculoskeletal: She exhibits no edema.  Left below knee amputation,    Neurological: She is alert.  Skin: Skin is warm and dry.  Psychiatric: She has a normal mood and affect.  Nursing note and vitals reviewed.     ED Treatments / Results  Labs (all labs ordered are listed, but only abnormal results are displayed) Labs Reviewed  CBC WITH DIFFERENTIAL/PLATELET - Abnormal; Notable for the following:       Result Value   Hemoglobin 15.9 (*)    HCT 48.9 (*)    MCV 105.4 (*)    MCH 34.3 (*)    RDW 22.8 (*)    All other components within normal limits  ACETAMINOPHEN LEVEL - Abnormal; Notable for the following:    Acetaminophen (Tylenol), Serum <10 (*)    All other components within normal limits  PROTIME-INR - Abnormal; Notable for the following:    Prothrombin Time 19.1 (*)    All other components within normal limits  AMMONIA - Abnormal; Notable for the following:    Ammonia 39 (*)    All other components within normal limits  COMPREHENSIVE METABOLIC PANEL - Abnormal; Notable for the following:    Sodium 134 (*)    Chloride 91 (*)    CO2 19 (*)    Glucose, Bld 100 (*)    BUN 62 (*)    Creatinine, Ser 5.49 (*)    Calcium 8.6 (*)    Total Protein 5.8 (*)    AST 229 (*)  ALT 175 (*)    Total Bilirubin 4.4 (*)    GFR calc non Af Amer 7 (*)    GFR calc Af Amer 8 (*)    Anion gap 24 (*)    All other components within normal limits   I-STAT CG4 LACTIC ACID, ED - Abnormal; Notable for the following:    Lactic Acid, Venous 4.88 (*)    All other components within normal limits  I-STAT TROPOININ, ED - Abnormal; Notable for the following:    Troponin i, poc 0.84 (*)    All other components within normal limits  I-STAT VENOUS BLOOD GAS, ED - Abnormal; Notable for the following:    pCO2, Ven 35.8 (*)    pO2, Ven 48.0 (*)    Acid-base deficit 3.0 (*)    All other components within normal limits  CULTURE, BLOOD (ROUTINE X 2)  CULTURE, BLOOD (ROUTINE X 2)  LIPASE, BLOOD  BLOOD GAS, VENOUS  CBG MONITORING, ED  TYPE AND SCREEN    EKG  EKG Interpretation None       Radiology Dg Chest 2 View  Result Date: 11/17/2016 CLINICAL DATA:  Generalized weakness. EXAM: CHEST  2 VIEW COMPARISON:  September 12, 2016 FINDINGS: The study is limited by low lung volumes. The cardiomediastinal silhouette is stable. There is a torturous thoracic aorta. The hila remain prominent but less so in the interval. No pulmonary nodules or masses. Mild atelectasis in the right base. No focal infiltrate or overt edema. IMPRESSION: No interval change or acute abnormality. Electronically Signed   By: Dorise Bullion III M.D   On: 11/04/2016 10:34    Procedures Procedures (including critical care time)  Medications Ordered in ED Medications - No data to display   Initial Impression / Assessment and Plan / ED Course  I have reviewed the triage vital signs and the nursing notes.  Pertinent labs & imaging results that were available during my care of the patient were reviewed by me and considered in my medical decision making (see chart for details).     Pt was scheduled for dialysis today but was not responsive enough to go.  Pt has recently had pain medication increased.  Pt observed.  Pt has had stable vitals. Labs returned pt has increasing liver functions and elevated ammonia level.   Pt observed long enough I do not feel this is narcotic  sedation.  [name]  Consult to Internal medicine to admit.  Final Clinical Impressions(s) / ED Diagnoses   Final diagnoses:  Generalized weakness  Encephalopathy  Acute renal failure, unspecified acute renal failure type (HCC)    New Prescriptions New Prescriptions   No medications on file   Pt given Iv fluids, two  250cc boluses.   I spoke with [facility] Internal Medicine teaching service who will see pt for admission   Sidney Ace 11/19/2016 1519    Charlesetta Shanks, MD 11/05/16 [phone]

## 2016-10-31 NOTE — ED Notes (Signed)
Patient transported to X-ray 

## 2016-10-31 NOTE — ED Notes (Signed)
Per Lab, the ammonia, lipase, and CMP was grossly hemolyzed so new blood draw is necessary.

## 2016-10-31 NOTE — ED Triage Notes (Addendum)
Pt sent from [facility] for lethargy increasing. ESRD. Pain and anxiety meds recently increased. Pt is alert to self and situation. She reports her pain is better controlled but she would prefer to be more alert and in more pain. She is closing eyes during conversation. Pupils pinpoint and sluggish. States she has not been eating and drinking well last few days. Still makes urine.Goes to dailysis MWF and reports no issues. DNR form with her.

## 2016-11-01 DIAGNOSIS — D696 Thrombocytopenia, unspecified: Secondary | ICD-10-CM

## 2016-11-01 DIAGNOSIS — E1151 Type 2 diabetes mellitus with diabetic peripheral angiopathy without gangrene: Secondary | ICD-10-CM

## 2016-11-01 DIAGNOSIS — I272 Pulmonary hypertension, unspecified: Secondary | ICD-10-CM

## 2016-11-01 DIAGNOSIS — L8990 Pressure ulcer of unspecified site, unspecified stage: Secondary | ICD-10-CM | POA: Insufficient documentation

## 2016-11-01 DIAGNOSIS — R5383 Other fatigue: Secondary | ICD-10-CM

## 2016-11-01 DIAGNOSIS — I214 Non-ST elevation (NSTEMI) myocardial infarction: Principal | ICD-10-CM

## 2016-11-01 DIAGNOSIS — N186 End stage renal disease: Secondary | ICD-10-CM

## 2016-11-01 DIAGNOSIS — I132 Hypertensive heart and chronic kidney disease with heart failure and with stage 5 chronic kidney disease, or end stage renal disease: Secondary | ICD-10-CM

## 2016-11-01 DIAGNOSIS — D631 Anemia in chronic kidney disease: Secondary | ICD-10-CM

## 2016-11-01 DIAGNOSIS — R740 Nonspecific elevation of levels of transaminase and lactic acid dehydrogenase [LDH]: Secondary | ICD-10-CM

## 2016-11-01 DIAGNOSIS — E1122 Type 2 diabetes mellitus with diabetic chronic kidney disease: Secondary | ICD-10-CM

## 2016-11-01 DIAGNOSIS — I2510 Atherosclerotic heart disease of native coronary artery without angina pectoris: Secondary | ICD-10-CM

## 2016-11-01 DIAGNOSIS — E785 Hyperlipidemia, unspecified: Secondary | ICD-10-CM

## 2016-11-01 DIAGNOSIS — Z89512 Acquired absence of left leg below knee: Secondary | ICD-10-CM

## 2016-11-01 DIAGNOSIS — Z79899 Other long term (current) drug therapy: Secondary | ICD-10-CM

## 2016-11-01 DIAGNOSIS — Z9049 Acquired absence of other specified parts of digestive tract: Secondary | ICD-10-CM

## 2016-11-01 DIAGNOSIS — I5042 Chronic combined systolic (congestive) and diastolic (congestive) heart failure: Secondary | ICD-10-CM

## 2016-11-01 DIAGNOSIS — Z992 Dependence on renal dialysis: Secondary | ICD-10-CM

## 2016-11-01 DIAGNOSIS — E039 Hypothyroidism, unspecified: Secondary | ICD-10-CM

## 2016-11-01 DIAGNOSIS — I252 Old myocardial infarction: Secondary | ICD-10-CM

## 2016-11-01 DIAGNOSIS — Z955 Presence of coronary angioplasty implant and graft: Secondary | ICD-10-CM

## 2016-11-01 LAB — CBC WITH DIFFERENTIAL/PLATELET
BASOS PCT: 0 %
Basophils Absolute: 0 10*3/uL (ref 0.0–0.1)
Eosinophils Absolute: 0 10*3/uL (ref 0.0–0.7)
Eosinophils Relative: 0 %
HCT: 46.2 % — ABNORMAL HIGH (ref 36.0–46.0)
Hemoglobin: 14.9 g/dL (ref 12.0–15.0)
Lymphocytes Relative: 7 %
Lymphs Abs: 0.8 10*3/uL (ref 0.7–4.0)
MCH: 33.2 pg (ref 26.0–34.0)
MCHC: 32.3 g/dL (ref 30.0–36.0)
MCV: 102.9 fL — ABNORMAL HIGH (ref 78.0–100.0)
MONO ABS: 0.6 10*3/uL (ref 0.1–1.0)
Monocytes Relative: 5 %
NEUTROS ABS: 10.1 10*3/uL — AB (ref 1.7–7.7)
NEUTROS PCT: 88 %
PLATELETS: 46 10*3/uL — AB (ref 150–400)
RBC: 4.49 MIL/uL (ref 3.87–5.11)
RDW: 22.6 % — ABNORMAL HIGH (ref 11.5–15.5)
WBC: 11.5 10*3/uL — ABNORMAL HIGH (ref 4.0–10.5)

## 2016-11-01 LAB — RENAL FUNCTION PANEL
ANION GAP: 21 — AB (ref 5–15)
Albumin: 3.2 g/dL — ABNORMAL LOW (ref 3.5–5.0)
BUN: 34 mg/dL — AB (ref 6–20)
CALCIUM: 8.2 mg/dL — AB (ref 8.9–10.3)
CO2: 20 mmol/L — AB (ref 22–32)
Chloride: 96 mmol/L — ABNORMAL LOW (ref 101–111)
Creatinine, Ser: 3.8 mg/dL — ABNORMAL HIGH (ref 0.44–1.00)
GFR calc Af Amer: 13 mL/min — ABNORMAL LOW (ref >60)
GFR calc non Af Amer: 11 mL/min — ABNORMAL LOW (ref >60)
GLUCOSE: 76 mg/dL (ref 65–99)
Phosphorus: 6.2 mg/dL — ABNORMAL HIGH (ref 2.5–4.6)
Potassium: 3.6 mmol/L (ref 3.5–5.1)
Sodium: 137 mmol/L (ref 135–145)

## 2016-11-01 LAB — HEPATIC FUNCTION PANEL
ALT: 135 U/L — AB (ref 14–54)
AST: 134 U/L — AB (ref 15–41)
Albumin: 3.2 g/dL — ABNORMAL LOW (ref 3.5–5.0)
Alkaline Phosphatase: 69 U/L (ref 38–126)
BILIRUBIN DIRECT: 2.4 mg/dL — AB (ref 0.1–0.5)
BILIRUBIN TOTAL: 4.5 mg/dL — AB (ref 0.3–1.2)
Indirect Bilirubin: 2.1 mg/dL — ABNORMAL HIGH (ref 0.3–0.9)
Total Protein: 5.4 g/dL — ABNORMAL LOW (ref 6.5–8.1)

## 2016-11-01 LAB — BLOOD GAS, ARTERIAL
ACID-BASE DEFICIT: 5.1 mmol/L — AB (ref 0.0–2.0)
BICARBONATE: 18.4 mmol/L — AB (ref 20.0–28.0)
DRAWN BY: 331001
O2 CONTENT: 6 L/min
O2 Saturation: 98.5 %
PATIENT TEMPERATURE: 98.6
pCO2 arterial: 28.2 mmHg — ABNORMAL LOW (ref 32.0–48.0)
pH, Arterial: 7.43 (ref 7.350–7.450)
pO2, Arterial: 175 mmHg — ABNORMAL HIGH (ref 83.0–108.0)

## 2016-11-01 LAB — GLUCOSE, CAPILLARY
GLUCOSE-CAPILLARY: 78 mg/dL (ref 65–99)
Glucose-Capillary: 72 mg/dL (ref 65–99)
Glucose-Capillary: 67 mg/dL (ref 65–99)
Glucose-Capillary: 89 mg/dL (ref 65–99)

## 2016-11-01 LAB — TROPONIN I
TROPONIN I: 2.29 ng/mL — AB (ref <0.03)
TROPONIN I: 2.04 ng/mL — AB (ref <0.03)
Troponin I: 1.9 ng/mL (ref <0.03)

## 2016-11-01 LAB — BILIRUBIN, FRACTIONATED(TOT/DIR/INDIR)
BILIRUBIN INDIRECT: 2.2 mg/dL — AB (ref 0.3–0.9)
BILIRUBIN TOTAL: 4.6 mg/dL — AB (ref 0.3–1.2)
Bilirubin, Direct: 2.4 mg/dL — ABNORMAL HIGH (ref 0.1–0.5)

## 2016-11-01 LAB — HEPARIN LEVEL (UNFRACTIONATED)
Heparin Unfractionated: 0.11 IU/mL — ABNORMAL LOW (ref 0.30–0.70)
Heparin Unfractionated: <0.1 IU/mL — ABNORMAL LOW (ref 0.30–0.70)

## 2016-11-01 LAB — HEPATITIS B SURFACE ANTIGEN: Hepatitis B Surface Ag: NEGATIVE

## 2016-11-01 LAB — MRSA PCR SCREENING: MRSA BY PCR: NEGATIVE

## 2016-11-01 LAB — LACTIC ACID, PLASMA
LACTIC ACID, VENOUS: 3.7 mmol/L — AB (ref 0.5–1.9)
Lactic Acid, Venous: 3.3 mmol/L (ref 0.5–1.9)
Lactic Acid, Venous: 4 mmol/L (ref 0.5–1.9)

## 2016-11-01 MED ORDER — CLONAZEPAM 1 MG PO TABS
1.0000 mg | ORAL_TABLET | Freq: Two times a day (BID) | ORAL | Status: DC | PRN
  Administered 2016-11-01: 1 mg via ORAL
  Filled 2016-11-01: qty 1

## 2016-11-01 MED ORDER — ASPIRIN EC 81 MG PO TBEC
81.0000 mg | DELAYED_RELEASE_TABLET | Freq: Every day | ORAL | Status: DC
  Filled 2016-11-01: qty 1

## 2016-11-01 MED ORDER — OXYCODONE-ACETAMINOPHEN 5-325 MG PO TABS
1.0000 | ORAL_TABLET | Freq: Two times a day (BID) | ORAL | Status: DC | PRN
  Administered 2016-11-01: 1 via ORAL
  Filled 2016-11-01: qty 1

## 2016-11-01 MED ORDER — OXYCODONE-ACETAMINOPHEN 5-325 MG PO TABS
1.0000 | ORAL_TABLET | Freq: Two times a day (BID) | ORAL | Status: DC
  Administered 2016-11-01 (×2): 1 via ORAL
  Filled 2016-11-01 (×2): qty 1

## 2016-11-01 MED ORDER — CLONAZEPAM 1 MG PO TABS: 1.0000 mg | ORAL_TABLET | Freq: Two times a day (BID) | ORAL | Status: DC

## 2016-11-01 NOTE — Progress Notes (Signed)
Notified Dr. Laural BenesJohnson of lactic 3.7

## 2016-11-01 NOTE — Progress Notes (Signed)
ANTICOAGULATION CONSULT NOTE - Follow Up Consult  Pharmacy Consult for Heparin  Indication: chest pain/ACS  Patient Measurements: Height: 5\' 8"  (172.7 cm) Weight: 168 lb 8 oz (76.4 kg) IBW/kg (Calculated) : 63.9  Vital Signs: Temp: 98.7 F (37.1 C) (06/08 2310) Temp Source: Axillary (06/08 2310) BP: 140/81 (06/08 2209) Pulse Rate: 75 (06/08 2209)  Labs:  Recent Labs  11/20/2016 1142 11/14/2016 1145 11/07/2016 1249 10/29/2016 1725 11/01/16 0139 11/01/16 0140  HGB  --  15.9*  --   --   --  14.9  HCT  --  48.9*  --   --   --  46.2*  PLT  --  PLATELET CLUMPS NOTED ON SMEAR, UNABLE TO ESTIMATE  --   --   --  46*  LABPROT 19.1*  --   --   --   --   --   INR 1.59  --   --   --   --   --   HEPARINUNFRC  --   --   --   --  0.11*  --   CREATININE  --   --  5.49*  --  3.80*  --   TROPONINI  --   --   --  1.01* 1.90*  --     Estimated Creatinine Clearance: 14.3 mL/min (A) (by C-G formula based on SCr of 3.8 mg/dL (H)).  Assessment: 71 y/o F on heparin for elevated troponin, Hgb ok, Plts ?46, no bleeding noted, IV pump has been beeping some per RN, will be less aggressive with rate increase  Goal of Therapy:  Heparin level 0.3-0.7 units/ml Monitor platelets by anticoagulation protocol: Yes   Plan:  -Inc heparin to 1000 units/hr -1200 HL  Abran DukeLedford, Dionicio Shelnutt 11/01/2016,3:36 AM

## 2016-11-01 NOTE — Evaluation (Signed)
Clinical/Bedside Swallow Evaluation Patient Details  Name: [name] MRN: [id number] Date of Birth: 24-Sep-1945  Today's Date: 11/01/2016 Time: SLP Start Time (ACUTE ONLY): 1100 SLP Stop Time (ACUTE ONLY): 1112 SLP Time Calculation (min) (ACUTE ONLY): 12 min  Past Medical History:  Past Medical History:  Diagnosis Date  . Anxiety   . Asthma   . CAD (coronary artery disease)    s/p MI x 2, s/p stent x 4 (05/2005 with DES placed at that time)  . Carpal tunnel syndrome, bilateral   . Chronic systolic congestive heart failure (HCC)    EF 35-40% per 2D echo (03/2011)  . Constipation   . Diabetes mellitus type 2, controlled, with complications (HCC)    diet controlled  . Diabetic retinopathy   . ESRD (end stage renal disease) on dialysis [facility]) HD since 07/2008   [facility]; Mon, [place], Friday  . Fibromyalgia   . Hemorrhoids   . Hyperlipidemia   . Hypertension   . Hypothyroid   . Major depression   . Migraines   . Morphea   . Normocytic anemia    BL Hgb 10-12  //  Chronic, likely multifactorial AOCD and possible iron deficiency component with transferrin sat < 20 historically.  . Osteoporosis   . PAD (peripheral artery disease) (HCC)    s/p L BKA  . Peripheral neuropathy   . PONV (postoperative nausea and vomiting)   . Pulmonary hypertension (HCC)    PA Peak pressure 57 mmHg per 2D echo (03/2011)  . Refusal of blood transfusions as patient is Jehovah's Witness   . Secondary hyperparathyroidism (HCC)   . Vitamin D deficiency    Past Surgical History:  Past Surgical History:  Procedure Laterality Date  . AV FISTULA PLACEMENT  ~ 2011   right upper arm  . BYPASS GRAFT  2006  . CARDIAC CATHETERIZATION  2006, 2007   pt had 4 stents placed  . CATARACT EXTRACTION, BILATERAL Bilateral 2000  . CHOLECYSTECTOMY  1987  . HARDWARE REMOVAL Left 07/03/2016   Procedure: LEFT LEG HARDWARE REMOVAL, REMOVAL OF PINS;  Surgeon: Cammy Copa, MD;  Location: [facility];  Service: Orthopedics;   Laterality: Left;  . HEMIARTHROPLASTY HIP Left 10/2009  . JOINT REPLACEMENT  2007   Partial placement Left Hip  . LEFT HEART CATH AND CORONARY ANGIOGRAPHY N/A 09/15/2016   Procedure: Left Heart Cath and Coronary Angiography;  Surgeon: Kathleene Hazel, MD;  Location: [facility] INVASIVE CV LAB;  Service: Cardiovascular;  Laterality: N/A;  . LEG AMPUTATION BELOW KNEE Left 10/2008   left  . ORIF FEMUR FRACTURE Left 06/09/2016   Procedure: CLOSED REDUCTION,  CANNULATED SCREWS PINNING;  Surgeon: Cammy Copa, MD;  Location: [facility] OR;  Service: Orthopedics;  Laterality: Left;  . PERIPHERAL ARTERIAL STENT GRAFT Bilateral    bilaterally "(left ~ 2010; right ~ 2011"  . VAGINAL HYSTERECTOMY  1994   with BSO   HPI:  71yo female with PMH of CAD s/p MI x2 and stents x4, ESRD on HD MWF, HTN, HLD, pulmonary hypertension, anemia of chronic disease, T2DM, combined CHF, PAD s/p left BKA presenting to [facility] from dialysis due to increased weakness and somnolence.    Assessment / Plan / Recommendation Clinical Impression  Patient presents with a functional oropharyngeal swallow. Oral transit of bolus prolonged with regular texture solids only due to generalized lethargy and deconditioning however remains functional. No overt s/s of aspiration observed, even when challenged with 3 oz. of water consumed via consecutive  boluses via straw. Offerred option of softer solids to facilitate easily mastication however patient declined. No SLP f/u indicated at this time.  SLP Visit Diagnosis: Dysphagia, unspecified (R13.10)    Aspiration Risk  Mild aspiration risk    Diet Recommendation Regular;Thin liquid   Liquid Administration via: Cup;Straw Medication Administration: Whole meds with liquid Supervision: Staff to assist with self feeding (as needed) Compensations: Slow rate;Small sips/bites Postural Changes: Seated upright at 90 degrees    Other  Recommendations Oral Care Recommendations: Oral care BID   Follow  up Recommendations None      Frequency and Duration            Prognosis        Swallow Study   General HPI: 71yo female with PMH of CAD s/p MI x2 and stents x4, ESRD on HD MWF, HTN, HLD, pulmonary hypertension, anemia of chronic disease, T2DM, combined CHF, PAD s/p left BKA presenting to [facility] from dialysis due to increased weakness and somnolence.  Type of Study: Bedside Swallow Evaluation Previous Swallow Assessment: none Diet Prior to this Study: NPO Temperature Spikes Noted: No Respiratory Status: Nasal cannula History of Recent Intubation: No Behavior/Cognition: Cooperative;Pleasant mood;Lethargic/Drowsy Oral Cavity Assessment: Within Functional Limits Oral Care Completed by SLP: Recent completion by staff Oral Cavity - Dentition: Adequate natural dentition Self-Feeding Abilities: Needs assist Patient Positioning: Upright in bed Baseline Vocal Quality: Low vocal intensity Volitional Cough: Weak Volitional Swallow: Able to elicit    Oral/Motor/Sensory Function Overall Oral Motor/Sensory Function: Generalized oral weakness   Ice Chips Ice chips: Not tested   Thin Liquid Thin Liquid: Within functional limits Presentation: Cup;Straw    Nectar Thick Nectar Thick Liquid: Not tested   Honey Thick Honey Thick Liquid: Not tested   Puree Puree: Within functional limits Presentation: Spoon   Solid   GO  Kendryck Lacroix MA, CCC-SLP [phone]  Solid: Within functional limits (prolonged but functional mastication)        Shaketa Serafin Meryl 11/01/2016,11:58 AM

## 2016-11-01 NOTE — Consult Note (Signed)
Cardiology Consultation:   Patient ID: [name]; [id number]; [birth year]   Admit date: 11/13/2016 Date of Consult: 11/01/2016  Primary Care Provider: Janifer Adie, MD Primary Cardiologist: new Primary Electrophysiologist:  none   Patient Profile:   [name] is a 71 y.o. female with a hx of severe peripheral vascular disesase, ERD on HD for 10 years who is being seen today for the evaluation of NSTEMI at the request of Dr. Lysbeth Galas.  History of Present Illness:   [name] is a 71 year old woman with a history of severe vascular disease, status post remote myocardial infarction, with left ventricular dysfunction, diabetes, and end-stage renal disease on hemodialysis. The patient had altered mentation and was increasingly unresponsive at her dialysis session and was transferred for additional evaluation. Her troponin was noted to be elevated in the 2 range. She has complained of abdominal pain as well as chest pain intermittently. The patient complains of weight loss and anorexia over the last several months. She is lost 10 pounds in 3 months. She notes early satiety. She has some pain in her right foot. She is status post AKA on the left in the past. She has not had syncope.  Past Medical History:  Diagnosis Date  . Anxiety   . Asthma   . CAD (coronary artery disease)    s/p MI x 2, s/p stent x 4 (05/2005 with DES placed at that time)  . Carpal tunnel syndrome, bilateral   . Chronic systolic congestive heart failure ([place])    EF 35-40% per 2D echo (03/2011)  . Constipation   . Diabetes mellitus type 2, controlled, with complications ([place])    diet controlled  . Diabetic retinopathy   . ESRD (end stage renal disease) on dialysis [facility]) HD since 07/2008   [facility]; Mon, [place], Friday  . Fibromyalgia   . Hemorrhoids   . Hyperlipidemia   . Hypertension   . Hypothyroid   . Major depression   . Migraines   . Morphea   . Normocytic anemia    BL Hgb 10-12  //   Chronic, likely multifactorial AOCD and possible iron deficiency component with transferrin sat < 20 historically.  . Osteoporosis   . PAD (peripheral artery disease) (HCC)    s/p L BKA  . Peripheral neuropathy   . PONV (postoperative nausea and vomiting)   . Pulmonary hypertension (HCC)    PA Peak pressure 57 mmHg per 2D echo (03/2011)  . Refusal of blood transfusions as patient is Jehovah's Witness   . Secondary hyperparathyroidism ([place])   . Vitamin D deficiency     Past Surgical History:  Procedure Laterality Date  . AV FISTULA PLACEMENT  ~ 2011   right upper arm  . BYPASS GRAFT  2006  . CARDIAC CATHETERIZATION  2006, 2007   pt had 4 stents placed  . CATARACT EXTRACTION, BILATERAL Bilateral 2000  . CHOLECYSTECTOMY  1987  . HARDWARE REMOVAL Left 07/03/2016   Procedure: LEFT LEG HARDWARE REMOVAL, REMOVAL OF PINS;  Surgeon: Meredith Pel, MD;  Location: [place];  Service: Orthopedics;  Laterality: Left;  . HEMIARTHROPLASTY HIP Left 10/2009  . JOINT REPLACEMENT  2007   Partial placement Left Hip  . LEFT HEART CATH AND CORONARY ANGIOGRAPHY N/A 09/15/2016   Procedure: Left Heart Cath and Coronary Angiography;  Surgeon: Burnell Blanks, MD;  Location: [place] [facility];  Service: Cardiovascular;  Laterality: N/A;  . LEG AMPUTATION BELOW KNEE Left 10/2008  left  . ORIF FEMUR FRACTURE Left 06/09/2016   Procedure: CLOSED REDUCTION,  CANNULATED SCREWS PINNING;  Surgeon: Meredith Pel, MD;  Location: [place];  Service: Orthopedics;  Laterality: Left;  . PERIPHERAL ARTERIAL STENT GRAFT Bilateral    bilaterally "(left ~ 2010; right ~ 2011"  . VAGINAL HYSTERECTOMY  1994   with BSO     Inpatient Medications: Scheduled Meds: . [START ON 11-26-2016] aspirin EC  81 mg Oral Daily  . calcium carbonate  3 tablet Oral TID WC  . doxercalciferol  3 mcg Intravenous Q M,W,F-HD  . escitalopram  20 mg Oral Daily  . levothyroxine  100 mcg Oral QAC breakfast  . oxyCODONE-acetaminophen  1  tablet Oral BID  . pravastatin  20 mg Oral q1800  . sodium chloride flush  3 mL Intravenous Q12H   Continuous Infusions: . heparin 1,000 Units/hr (11/01/16 1000)   PRN Meds: clonazePAM  Allergies:    Allergies  Allergen Reactions  . Morphine And Related Other (See Comments)    Pt fell into deep sleep and could not be woken up had to be resuscitated says she did not overdose, unsure whether throat swelled or SOB pt does not remember  . Other Anaphylaxis    PEACHES  . Penicillins Swelling    Tongue swelling.   Marland Kitchen Pumpkin Seed Oil-Saw Palmetto-Zinc [Propalmex] Anaphylaxis  . Shrimp [Shellfish Allergy] Anaphylaxis  . Food Other (See Comments)    Nectarine, Pumpkin  . Lisinopril Other (See Comments)    unknown    Social History:   Social History   Social History  . Marital status: Divorced    Spouse name: N/A  . Number of children: N/A  . Years of education: N/A   Occupational History  . retired Unemployed    was [employer], disabled since 2006   Social History Main Topics  . Smoking status: Former Smoker    Packs/day: 2.00    Years: 30.00    Types: Cigarettes    Quit date: 04/07/1989  . Smokeless tobacco: Never Used  . Alcohol use No     Comment: "socially; I'm not an alcoholic"  . Drug use: No  . Sexual activity: Not on file   Other Topics Concern  . Not on file   Social History Narrative   Lives with her mother who is ill with lung CA in remission.  The two live in her apartment.  She is a Restaurant manager, fast food.  Enjoys reading, collecting dolls, and working on computer.    Family History:   The patient's family history includes Alcohol abuse in her father; Appendicitis in her brother; Cancer in her mother; Diabetes in her mother; Diabetes type II in her mother; Hypertension in her mother; Multiple myeloma in her mother.  ROS:  Please see the history of present illness.  ROS  All other ROS reviewed and negative.     Physical Exam/Data:   Vitals:   11/01/16  0800 11/01/16 0900 11/01/16 1000 11/01/16 1253  BP:      Pulse: 85 85 85   Resp: 20 (!) 23 (!) 22   Temp:    98.1 F (36.7 C)  TempSrc:    Oral  SpO2: 100% 98% 97%   Weight:      Height:        Intake/Output Summary (Last 24 hours) at 11/01/16 1317 Last data filed at 11/01/16 1000  Gross per 24 hour  Intake           384.77  ml  Output             2000 ml  Net         -1615.23 ml   Filed Weights   11/07/2016 1900 10/29/2016 2209 11/01/16 0600  Weight: 169 lb 12.1 oz (77 kg) 168 lb 8 oz (76.4 kg) 169 lb (76.7 kg)   Body mass index is 25.7 kg/m.  General:  Chronically ill-appearing 71 year old woman who looks older than her stated age but in no acute distress  HEENT: normal Lymph: no adenopathy Neck: 7 cm JVD Endocrine:  No thryomegaly Vascular: No carotid bruits; FA pulses 2+ bilaterally without bruits  Cardiac:  normal S1, S2; RRR; 1/6 systolic murmur  Lungs:  clear to auscultation bilaterally, no wheezing, rhonchi or rales  Abd: soft, nontender, no hepatomegaly  Ext: no edema Musculoskeletal:  No deformities, BUE and BLE strength normal and equal Skin: warm and dry  Neuro:  CNs 2-12 intact, no focal abnormalities noted Psych:  Normal affect    EKG:  The EKG was personally reviewed and demonstrates normal sinus rhythm with nonspecific ST-T wave abnormality  Relevant CV Studies: 2-D echo demonstrates an EF of 40%  Laboratory Data:  Chemistry Recent Labs Lab 11/13/2016 1249 11/01/16 0139  NA 134* 137  K 5.1 3.6  CL 91* 96*  CO2 19* 20*  GLUCOSE 100* 76  BUN 62* 34*  CREATININE 5.49* 3.80*  CALCIUM 8.6* 8.2*  GFRNONAA 7* 11*  GFRAA 8* 13*  ANIONGAP 24* 21*     Recent Labs Lab 11/22/2016 1249 11/01/16 0139 11/01/16 0140  PROT 5.8*  --  5.4*  ALBUMIN 3.6 3.2* 3.2*  AST 229*  --  134*  ALT 175*  --  135*  ALKPHOS 88  --  69  BILITOT 4.4* 4.6* 4.5*   Hematology Recent Labs Lab 10/25/2016 1145 11/01/16 0140  WBC 8.1 11.5*  RBC 4.64 4.49  HGB 15.9*  14.9  HCT 48.9* 46.2*  MCV 105.4* 102.9*  MCH 34.3* 33.2  MCHC 32.5 32.3  RDW 22.8* 22.6*  PLT PLATELET CLUMPS NOTED ON SMEAR, UNABLE TO ESTIMATE 46*   Cardiac Enzymes Recent Labs Lab 11/07/2016 1725 11/01/16 0139 11/01/16 0621  TROPONINI 1.01* 1.90* 2.29*    Recent Labs Lab 11/14/2016 1137  TROPIPOC 0.84*    BNPNo results for input(s): BNP, PROBNP in the last 168 hours.  DDimer No results for input(s): DDIMER in the last 168 hours.  Radiology/Studies:  Dg Chest 2 View  Result Date: 11/10/2016 CLINICAL DATA:  Generalized weakness. EXAM: CHEST  2 VIEW COMPARISON:  September 12, 2016 FINDINGS: The study is limited by low lung volumes. The cardiomediastinal silhouette is stable. There is a torturous thoracic aorta. The hila remain prominent but less so in the interval. No pulmonary nodules or masses. Mild atelectasis in the right base. No focal infiltrate or overt edema. IMPRESSION: No interval change or acute abnormality. Electronically Signed   By: Dorise Bullion III M.D   On: 11/01/2016 10:34   US Abdomen Limited Ruq  Result Date: 11/01/2016 CLINICAL DATA:  Initial evaluation for elevated bilirubin. History of cholecystectomy. EXAM: ULTRASOUND ABDOMEN LIMITED RIGHT UPPER QUADRANT COMPARISON:  Prior CT from 06/10/2016. FINDINGS: Gallbladder: Surgically absent. Common bile duct: Diameter: 5.2 mm Liver: No focal lesion identified. Within normal limits in parenchymal echogenicity. Small volume free fluid adjacent to the liver. Small right pleural effusion. IMPRESSION: 1. Status post cholecystectomy.  No biliary dilatation. 2. Small volume free fluid adjacent to the liver. 3. Right  pleural effusion. Electronically Signed   By: Jeannine Boga M.D.   On: 11/01/2016 02:41    Assessment and Plan:   1. Non-ST segment elevation MI - the patient does not have much clinical evidence for an MI. She has had minimal chest pain. She has no acute EKG changes. I am sure she has coronary disease as she  has fairly significant vascular disease. She is currently asymptomatic. Her multiple comorbidities make her a very poor candidate for interventional treatment. I would recommend observation at this point. I would not recommend starting IV anticoagulation as I think her risk of bleeding is significant. 2-D echo is reasonable though I doubt it is significantly different from the study she did 3 months ago 2. Altered mental status - the patient was a little more alert today on my examination. She would answer focused simple questions appropriately. She would only give one or 2 word answers however. 3. Failure to thrive with generalized weakness - the etiology of this process is unclear. She looks chronically and nearly terminally ill for reasons that are unclear. I suspect she will improve when her many pain medications are out of her system. Agree with holding sedating medications 4. End-stage renal disease on hemodialysis - when I see patient's with altered mental status, I'm always concerned about insufficient dialysis. I will defer evaluation of the dialysis access site to her primary team and the renal service.  Signed, Cristopher Peru, MD  11/01/2016 1:17 PM

## 2016-11-01 NOTE — Progress Notes (Addendum)
Spoke with patients (POA) [name] regarding end of life issues and not wanting to have patient go down to CT scan of the head. Paged Dr. Tilman NeatSumayya and new orders received and noted. IMTS MD will discuss possible palliative care consult with attending tomorrow. RN will continue to monitor.

## 2016-11-01 NOTE — Progress Notes (Signed)
Critical lactic acid of 4.0 paged to attending physician, Awaiting response.

## 2016-11-01 NOTE — Progress Notes (Signed)
[facility] Rounding Note  Subjective/Interval History:  Admitted to SDU d/t BP issues, elevated troponins [facility] Awake, interacting with family members, oriented C/O chest pressure and abdominal pain On IV heparin  Objective Vital signs in last 24 hours: Vitals:   11/01/16 0758 11/01/16 0800 11/01/16 0900 11/01/16 1000  BP:      Pulse:  85 85 85  Resp:  20 (!) 23 (!) 22  Temp: 98.7 F (37.1 C)     TempSrc: Oral     SpO2:  100% 98% 97%  Weight:      Height:       Weight change:   Intake/Output Summary (Last 24 hours) at 11/01/16 1034 Last data filed at 11/01/16 1000  Gross per 24 hour  Intake           634.77 ml  Output             2000 ml  Net         -1365.23 ml   Physical Exam:  Blood pressure (!) 143/71, pulse 85, temperature 98.7 F (37.1 C), temperature source Oral, resp. rate (!) 22, height 5\' 8"  (1.727 m), weight 76.7 kg (169 lb), SpO2 97 %. General: Elderly female, more alert than on admission - interacting with family members  Lungs:  Breathing is unlabored. Diminished at R base other wise CTA Heart: RRR with S1 S2 Abdomen: Not distended but pain in RUQ abd LLQ with palpation L BKA with trace edema/R extremity with 2+ edema/bruising around foot (from SNF "misadventure") MUCH more awake, oriented, complaining of pain Erythematous discrete vascular lesions to chest, left breast   Bruising of foot (post trauma at SNF) Dialysis Access: RUE AVF +bruit    Recent Labs Lab 2016/10/06 1249 11/01/16 0139  NA 134* 137  K 5.1 3.6  CL 91* 96*  CO2 19* 20*  GLUCOSE 100* 76  BUN 62* 34*  CREATININE 5.49* 3.80*  CALCIUM 8.6* 8.2*  PHOS  --  6.2*    Recent Labs Lab 2016/10/06 1249 11/01/16 0139 11/01/16 0140  AST 229*  --  134*  ALT 175*  --  135*  ALKPHOS 88  --  69  BILITOT 4.4* 4.6* 4.5*  PROT 5.8*  --  5.4*  ALBUMIN 3.6 3.2* 3.2*    Recent Labs Lab 2016/10/06 1249  LIPASE 33    Recent Labs Lab 2016/10/06 1249  AMMONIA 39*     Recent Labs Lab 2016/10/06 1145 11/01/16 0140  WBC 8.1 11.5*  NEUTROABS 7.2 10.1*  HGB 15.9* 14.9  HCT 48.9* 46.2*  MCV 105.4* 102.9*  PLT PLATELET CLUMPS NOTED ON SMEAR, UNABLE TO ESTIMATE 46*    Recent Labs Lab 2016/10/06 1725 11/01/16 0139 11/01/16 0621  TROPONINI 1.01* 1.90* 2.29*    Recent Labs Lab 2016/10/06 1058 2016/10/06 2203 11/01/16 0754  GLUCAP 96 88 72       Component Value Date/Time   LATICACIDVEN 3.3 (HH) 11/01/2016 0621   Studies/Results: Dg Chest 2 View  Result Date: 11/16/2016 CLINICAL DATA:  Generalized weakness. EXAM: CHEST  2 VIEW COMPARISON:  September 12, 2016 FINDINGS: The study is limited by low lung volumes. The cardiomediastinal silhouette is stable. There is a torturous thoracic aorta. The hila remain prominent but less so in the interval. No pulmonary nodules or masses. Mild atelectasis in the right base. No focal infiltrate or overt edema. IMPRESSION: No interval change or acute abnormality. Electronically Signed   By: Gerome Samavid  Williams III M.D   On: 07/18/2016  10:34   US Abdomen Limited Ruq  Result Date: 11/01/2016 CLINICAL DATA:  Initial evaluation for elevated bilirubin. History of cholecystectomy. EXAM: ULTRASOUND ABDOMEN LIMITED RIGHT UPPER QUADRANT COMPARISON:  Prior CT from 06/10/2016. FINDINGS: Gallbladder: Surgically absent. Common bile duct: Diameter: 5.2 mm Liver: No focal lesion identified. Within normal limits in parenchymal echogenicity. Small volume free fluid adjacent to the liver. Small right pleural effusion. IMPRESSION: 1. Status post cholecystectomy.  No biliary dilatation. 2. Small volume free fluid adjacent to the liver. 3. Right pleural effusion. Electronically Signed   By: Rise Mu M.D.   On: 11/01/2016 02:41   Medications: . heparin 1,000 Units/hr (11/01/16 1000)   . aspirin  325 mg Oral Daily  . calcium carbonate  3 tablet Oral TID WC  . doxercalciferol  3 mcg Intravenous Q M,W,F-HD  . escitalopram  20 mg Oral  Daily  . levothyroxine  100 mcg Oral QAC breakfast  . oxyCODONE-acetaminophen  1 tablet Oral BID  . pravastatin  20 mg Oral q1800  . sodium chloride flush  3 mL Intravenous Q12H  clonazePAM  Assessment/Recommendations:  1. AMS/Lethargy - MUCH improved. Alert and oriented. Family in and conversant with them (they DO report seeing a recent decline in pt). Sedating meds being readjusted.  2. ESRD - HD yesterday. Able to remove about 2 liters but still w/sig LE edema (BP limited further UF). Next HD Monday with further vol removal as tolerated. 3. NSTEMI - rising troponin in face of CP and known CAD. On IV heparin. Cardiology to see  4. Thrombocytopenia - new this admission. No h/o heparin related problems. (Hb stable, no schistocytes). Trend. 5. Elevated liver enzymes - admission AST 229 ALT 175 Bilirubin 4.4. Progressive worsening since April 2018. No cross sectional imaging since 05/2016. [place] absent GB, no biliary ductal dilatation. Transaminases sl improved today, bili still >4. Further evaluation per primary service.  6. Elevated lactic acid - hypotension on admission. Blood pressure currently more stable. Off ARB for now. Blood cultures pending. Not on ATB's  7.  Anemia  - Hgb 14.9. No ESA need. Pt is Jehovah Witness. 8.  Metabolic bone disease -  Continue Hectorol/ Fosrenol binder when eating  9.  Nutrition - Renal diet as tolerated /fluid restriction  10. Rash - on prednisone 60 mg at SNF. No details on this. Most prominent on chest. Per MAR steroids started 6/1. Stopped on admission 11. FTT- family reports recent decline in pt at SNF, poor po intake, less interactive, more "frail" - otherwise cannot be more specific   Camille Bal, MD [facility] [phone] pager 11/01/2016, 10:34 AM

## 2016-11-01 NOTE — Progress Notes (Signed)
Patient noted to be less interactive, not following commands, or verbalizing earlier today. O2 sats were 85% on 5 L [place], however there was difficulty obtaining accurate readings. Patient was seen at bedside along with primary team in the presence of family. Patient was only somewhat arousable, intermittently opening eyes with occasional moaning. She had minimal finger grasp with her right hand, absent on her left. Distal extremities were cool to touch. These were significant changes compared to earlier this morning.  Currently patient is admitted for NSTEMI which is being medically managed. She is not a good candidate for anticoagulation due to thrombocytopenia and high bleeding risk. She has multiple comorbidities including known CAD s/p MI and stents, ESRD on HD, HTN, pulmonary HTN, DM, CHF, PVD s/p left BKA, and HLD and is considered to be a poor candidate for interventional treatment. Family has confirmed patient's DNR/DNI status and would not want her to undergo any aggressive procedures. It was decided to obtain a CT Head to evaluate for any intracranial hemorrhage and to repeat a Troponin to monitor the trend (1.9 >> 2.29 this admission).  I spoke to the POA Mr. [name] in the presence of multiple family members/friends. He also confirmed that [name] would not want overly aggressive measures that would cause undue suffering in the event she were approaching end of life. He would like to proceed with obtaining the CT Head at this time. He understands this may not provide any information for which we can intervene. He would like some time to discuss with family regarding moving towards comfort care measures pending the imaging study. We will continue to hold overly sedating medications and I will update our oncoming night team regarding the patient's change in clinical status.  Darreld McleanVishal Louana Fontenot, MD Internal Medicine PGY-2

## 2016-11-01 NOTE — Progress Notes (Signed)
   Subjective:  Patient more awake this AM; she endorses vague chest pain and shortness of breath over the last week but cannot specify further. She denies fevers, chills, recent falls, abdominal pain.  Objective:  Vital signs in last 24 hours: Vitals:   11/01/16 0758 11/01/16 0800 11/01/16 0900 11/01/16 1000  BP:      Pulse:  85 85 85  Resp:  20 (!) 23 (!) 22  Temp: 98.7 F (37.1 C)     TempSrc: Oral     SpO2:  100% 98% 97%  Weight:      Height:       Constitutional: NAD, alert though somewhat slow to respond CV: distant heart sounds, RRR, no murmurs, rubs or gallops appreciated; 2+ pitting edema Resp: CTAB, no increased work of breathing Abd: soft, +BS, some RUQ discomfort  Assessment/Plan:  Principal Problem:   Non-ST elevation (NSTEMI) myocardial infarction (HCC) Active Problems:   ESRD (end stage renal disease) (HCC)   CAD, multiple vessel   PAD (peripheral artery disease) (HCC)   CHF (congestive heart failure) (HCC)   Hyperparathyroidism, secondary (HCC)   Altered mental status   Generalized weakness   Elevated bilirubin   Lactic acidosis   Pressure injury of skin  NSTEMI: Troponins trending up: 1>1.9>2.3 this AM. EKG this AM personally reviewed, continued T wave inversion in lead III, aVF, with biphasic Twaves in V5-6. Patient without chest pain today.  --trend troponins --ASA --heparin gtt --f/u echo --cards consulted - appreciate their assistance  Generalized weakness Lethargy: Appears to be improving with holding some of her meds; she was restarted on her oxy-apap 5mg  BID with clonopin prn overnight as she had complaints of over all pain. She remains afebrile, no signs of infection,  --hold sedating meds for now --trend LA --f/u AM CBC, blood cultures  Transaminitis, hyperbilirubinemia: CT abd/pel in Jan 2018 shows a stable mildly prominent biliary tree but no hepatic or gallbladder abnormality. Her transaminitis has been present and worsening since  April 2018. HIV, HCV and HBV screen negative within last 6 months. RUQ U/S shows s/p cholecystectomy, no biliary tree dilation and normal appearing liver. Etiology could be shock liver in setting of worsening heart function or ischemic event.  --f/u AM CMet  ESRD on HD MWF: Renal consulted - appreciate their assistance. Underwent HD last night with no fluid removal.  --renal on board --f/u renal panel in AM  Hypertension Combined CHF: Patient with last Echo 08/2016 showing EF 40-45% with G2DD. Her home regimen includes metoprolol succinate and valsartan. Patient with relative low BP (per HD notes normally 150s-170s). She was initially low-normal on presentation. BP's stable this morning. --hold metoprolol and valsartan for now  Hypothyroidism: Patient continued on home levothyroxine 100mcg daily.  T2DM: Per prior notes is diet controlled. --follow CBGs  Dispo: Anticipated discharge in approximately 3-5 day(s).   Nyra MarketSvalina, Fount Bahe, MD 11/01/2016, 11:00 AM Pager [phone]

## 2016-11-01 NOTE — Progress Notes (Signed)
Called Dr. Ladona Ridgelaylor about Cardiology consult due to troponin levels. Stated they would call. Will continue to monitor.

## 2016-11-01 NOTE — Progress Notes (Signed)
Multiple family members at patient bedside; patient just lies moaning.  Pain meds have been limited d/t holding all sedating meds at this time - medicated w/Oxy PO per PRN orders.  Family report they feel patient is just "giving up"; report decreased appetite and PO intake; decreased motivation for any/all activity.

## 2016-11-02 ENCOUNTER — Inpatient Hospital Stay (HOSPITAL_COMMUNITY): Payer: Medicare (Managed Care)

## 2016-11-02 DIAGNOSIS — I5022 Chronic systolic (congestive) heart failure: Secondary | ICD-10-CM

## 2016-11-02 DIAGNOSIS — N179 Acute kidney failure, unspecified: Secondary | ICD-10-CM

## 2016-11-02 DIAGNOSIS — Z7189 Other specified counseling: Secondary | ICD-10-CM

## 2016-11-02 DIAGNOSIS — R4182 Altered mental status, unspecified: Secondary | ICD-10-CM

## 2016-11-02 DIAGNOSIS — Z515 Encounter for palliative care: Secondary | ICD-10-CM

## 2016-11-02 DIAGNOSIS — R531 Weakness: Secondary | ICD-10-CM

## 2016-11-02 LAB — CBC
HCT: 48.9 % — ABNORMAL HIGH (ref 36.0–46.0)
Hemoglobin: 15.6 g/dL — ABNORMAL HIGH (ref 12.0–15.0)
MCH: 33.8 pg (ref 26.0–34.0)
MCHC: 31.9 g/dL (ref 30.0–36.0)
MCV: 106.1 fL — ABNORMAL HIGH (ref 78.0–100.0)
PLATELETS: 58 10*3/uL — AB (ref 150–400)
RBC: 4.61 MIL/uL (ref 3.87–5.11)
RDW: 23.3 % — AB (ref 11.5–15.5)
WBC: 11 10*3/uL — ABNORMAL HIGH (ref 4.0–10.5)

## 2016-11-02 LAB — COMPREHENSIVE METABOLIC PANEL
ALBUMIN: 3.4 g/dL — AB (ref 3.5–5.0)
ALT: 135 U/L — ABNORMAL HIGH (ref 14–54)
ANION GAP: 22 — AB (ref 5–15)
AST: 105 U/L — AB (ref 15–41)
Alkaline Phosphatase: 81 U/L (ref 38–126)
BUN: 53 mg/dL — AB (ref 6–20)
CHLORIDE: 95 mmol/L — AB (ref 101–111)
CO2: 20 mmol/L — ABNORMAL LOW (ref 22–32)
Calcium: 8.7 mg/dL — ABNORMAL LOW (ref 8.9–10.3)
Creatinine, Ser: 4.79 mg/dL — ABNORMAL HIGH (ref 0.44–1.00)
GFR calc Af Amer: 10 mL/min — ABNORMAL LOW (ref >60)
GFR calc non Af Amer: 9 mL/min — ABNORMAL LOW (ref >60)
GLUCOSE: 90 mg/dL (ref 65–99)
POTASSIUM: 4.4 mmol/L (ref 3.5–5.1)
SODIUM: 137 mmol/L (ref 135–145)
Total Bilirubin: 4.4 mg/dL — ABNORMAL HIGH (ref 0.3–1.2)
Total Protein: 5.4 g/dL — ABNORMAL LOW (ref 6.5–8.1)

## 2016-11-02 LAB — PHOSPHORUS: Phosphorus: 7.4 mg/dL — ABNORMAL HIGH (ref 2.5–4.6)

## 2016-11-02 LAB — GLUCOSE, CAPILLARY
GLUCOSE-CAPILLARY: 84 mg/dL (ref 65–99)
Glucose-Capillary: 107 mg/dL — ABNORMAL HIGH (ref 65–99)

## 2016-11-02 MED ORDER — HYDROMORPHONE HCL 1 MG/ML IJ SOLN: 0.2500 mg | INTRAMUSCULAR | Status: DC | PRN

## 2016-11-02 MED ORDER — HALOPERIDOL LACTATE 5 MG/ML IJ SOLN
0.5000 mg | INTRAMUSCULAR | Status: DC | PRN
Start: 2016-11-02 — End: 2016-11-02

## 2016-11-02 MED ORDER — BIOTENE DRY MOUTH MT LIQD: 15.0000 mL | OROMUCOSAL | Status: DC | PRN

## 2016-11-02 MED ORDER — ONDANSETRON HCL 4 MG/2ML IJ SOLN: 4.0000 mg | Freq: Four times a day (QID) | INTRAMUSCULAR | Status: DC | PRN

## 2016-11-02 MED ORDER — LEVOTHYROXINE SODIUM 100 MCG IV SOLR
50.0000 ug | Freq: Every day | INTRAVENOUS | Status: DC
  Filled 2016-11-02: qty 5

## 2016-11-02 MED ORDER — GLYCOPYRROLATE 0.2 MG/ML IJ SOLN
0.2000 mg | INTRAMUSCULAR | Status: DC | PRN
  Filled 2016-11-02: qty 1

## 2016-11-02 MED ORDER — POLYVINYL ALCOHOL 1.4 % OP SOLN: 1.0000 [drp] | Freq: Four times a day (QID) | OPHTHALMIC | Status: DC | PRN

## 2016-11-02 MED ORDER — HYDROMORPHONE HCL 1 MG/ML IJ SOLN
0.5000 mg | Freq: Four times a day (QID) | INTRAMUSCULAR | Status: DC
  Administered 2016-11-02: 0.5 mg via INTRAVENOUS
  Filled 2016-11-02: qty 0.5

## 2016-11-02 MED ORDER — HYDROMORPHONE HCL 1 MG/ML IJ SOLN
0.5000 mg | Freq: Once | INTRAMUSCULAR | Status: AC
  Administered 2016-11-02: 0.5 mg via INTRAVENOUS
  Filled 2016-11-02: qty 0.5

## 2016-11-02 MED ORDER — ACETAMINOPHEN 650 MG RE SUPP
650.0000 mg | RECTAL | Status: DC | PRN
  Filled 2016-11-02: qty 1

## 2016-11-02 MED ORDER — HALOPERIDOL LACTATE 2 MG/ML PO CONC
0.5000 mg | ORAL | Status: DC | PRN
  Filled 2016-11-02: qty 0.3

## 2016-11-05 LAB — CULTURE, BLOOD (ROUTINE X 2)
CULTURE: NO GROWTH
Culture: NO GROWTH
SPECIAL REQUESTS: ADEQUATE
Special Requests: ADEQUATE

## 2016-11-23 NOTE — Progress Notes (Signed)
Called down to pt's room, family in hallway crying. Family states she is gone. Pt has no heart beat, had agonal breaths x2. RN Baxter HireKristen, RN Tiffany, and myself,RN assessed pt,  pt had no heartbeat, and respirations stopped completely.  Pt is a DNR (do not resuscitate), pt pronounced expired at 1335.  Family comforted. Dr Obie DredgeBlum notified and Nigel SloopSarah Dunn,NP with palliative notified.

## 2016-11-23 NOTE — Progress Notes (Signed)
Dr. Obie DredgeBlum made aware that death certificate needing to be completed. Caramia Boutin, Randall AnKristin Jessup RN

## 2016-11-23 NOTE — Progress Notes (Signed)
   Subjective:  Patient has deteriorated over the past 24 hours.  Her aunt was present today and reports patient has been "more tired" lately.  She is much less responsive today and not really following commands.  Her POA is Brother [name] who was here last night and there have been discussions started regarding end of life decision making given her decline.  Objective:  Vital signs in last 24 hours: Vitals:   11/01/16 1546 11/01/16 2036 10/24/2016 0100 11/19/2016 0355  BP: (!) 110/56 (!) 121/50  (!) 114/52  Pulse: (!) 109 83  81  Resp: 16 20    Temp:  97.6 F (36.4 C)  98.2 F (36.8 C)  TempSrc:  Oral  Axillary  SpO2: (!) 85% 100% 100% 96%  Weight:    172 lb 2.9 oz (78.1 kg)  Height:       Constitutional: elderly woman, lying in bed, on [name], eyes open, not responding today. CV: distant heart sounds, RRR Resp: anterior breath sounds clear Abd: soft, NTND Ext: 2+ R LE edema with bruising of ankle.  Peripheral extremities cool to touch but not cyanotic  Assessment/Plan:  Principal Problem:   Non-ST elevation (NSTEMI) myocardial infarction [facility](HCC) Active Problems:   ESRD (end stage renal disease) (HCC)   CAD, multiple vessel   PAD (peripheral artery disease) (HCC)   CHF (congestive heart failure) (HCC)   Hyperparathyroidism, secondary (HCC)   Altered mental status   Generalized weakness   Elevated bilirubin   Lactic acidosis   Pressure injury of skin  NSTEMI: Appreciate cardiology input and assistance.  Patient not a good interventional candidate especially given last 24 hour deterioration.  Troponin has plateaued.  Holding heparin due to bleeding risk.  Hold Echo has unlikely to change outcome at this time.  Failure to Thrive Generalized weakness Lethargy Patient looks worse today and deteriorated yesterday afternoon after showing some improvement in the morning.  Family members have noted that she is tired and wants to see her mother who recently passed away.  She has been  eating and drinking less at home this past week or so before admission.  End of life discussions have been initiated with family and POA.  Comfort care discussions ongoing with family.  This would need to also include no further HD and family would need understanding of this.   - Palliative care has been consulted this morning to assist with discussions and planning.  Their assistance is greatly appreciated.  ESRD on HD MWF Renal consulted - appreciate their assistance. Patient discussed with Dr. Eliott Nineunham this morning.  Have held off on HD orders for tomorrow pending discussion with POA.  Per notes, she did not tolerate HD well on Friday.  Transaminitis, hyperbilirubinemia CT abd/pel in Jan 2018 shows a stable mildly prominent biliary tree but no hepatic or gallbladder abnormality. Her transaminitis has been present and worsening since April 2018. HIV, HCV and HBV screen negative within last 6 months. RUQ U/S shows s/p cholecystectomy, no biliary tree dilation and normal appearing liver.  AST/ALT slightly improved.  Bilirubin still greater than 4.  Hypertension Combined CHF Patient with last Echo 08/2016 showing EF 40-45% with G2DD. Her home regimen includes metoprolol succinate and valsartan. BP's stable this morning. --hold metoprolol and valsartan for now  Hypothyroidism: Patient continued on home levothyroxine 100mcg daily.  T2DM: Per prior notes is diet controlled. --follow CBGs  Dispo: Anticipated discharge in approximately 3-5 day(s).   [name], [name], [name] 11/03/2016, 9:07 AM

## 2016-11-23 NOTE — Progress Notes (Signed)
Pt not taking po meds or po's, not alert enough. Moans out in discomfort pain, family (POA-pastor Trevino at bedside and stated pt asked for pain med).  Dr Obie DredgeBlum notified of need for IV pain med. Pt had dilaudid 0.5mg  IV x1 time last night/early am. No current order for non-po route pain med.

## 2016-11-23 NOTE — Progress Notes (Signed)
Murrell ConverseSarah Dunn, NP palliative up to see pt/family at this time. Dr Obie DredgeBlum and other residents also up to see pt/family

## 2016-11-23 NOTE — Progress Notes (Signed)
MAR, H&P, and Labs printed for [facility]. Spoke with Thayer Ohmhris the NeurosurgeonAdministrative Coordinator for my shift and she said it was ok to print and release these documents from the patient's chart to [facility] who was here to obtain eye tissues from the deceased patient.

## 2016-11-23 NOTE — Progress Notes (Signed)
Allowed pt's family time with their loved one. All family has come that were planning to come family states. They are ready to leave at this time. Pt to be prepped to take to the morgue. Family undecided on funeral home, should be able to decide tomorrow, pt's POA Tina PlunkXavier [name] has number for patient placement to call when family has decided on funeral home.

## 2016-11-23 NOTE — Progress Notes (Signed)
[facility] Rounding Note  Subjective/Interval History:  Over course of last 24 hours much less responsive and in fact almost moribund in appearance currently Cheyne Stokes breathing pattern Discussions noted with POA regarding end of life decision making given this further decline  Objective Vital signs in last 24 hours: Vitals:   11/01/16 1546 11/01/16 2036 11/01/2016 0100 11/07/2016 0355  BP: (!) 110/56 (!) 121/50  (!) 114/52  Pulse: (!) 109 83  81  Resp: 16 20    Temp:  97.6 F (36.4 C)  98.2 F (36.8 C)  TempSrc:  Oral  Axillary  SpO2: (!) 85% 100% 100% 96%  Weight:    78.1 kg (172 lb 2.9 oz)  Height:       Weight change: 1.1 kg (2 lb 6.8 oz)  Intake/Output Summary (Last 24 hours) at 11/09/2016 0841 Last data filed at 11/01/16 2300  Gross per 24 hour  Intake               80 ml  Output                0 ml  Net               80 ml   Physical Exam:  Blood pressure (!) 114/52, pulse 81, temperature 98.2 F (36.8 C), temperature source Axillary, resp. rate 20, height 5\' 8"  (1.727 m), weight 78.1 kg (172 lb 2.9 oz), SpO2 96 %. Elderly female, eyes open, moans intermittently, not responding to me - very clear deterioration past 24 hours Cheyne Stokes breathing pattern  Lungs anteriorly clear Dynamic precordium S1S2 No S3 2/6 murmur LSB Abdomen not distended.  No pain response with deep palpation Extremities cool, not cyanotic 2+ edema RLE w/bruising 1+ stump edema L 1+ edema upper extremities Dialysis Access: RUE AVF +bruit    Recent Labs Lab 11-24-16 1249 11/01/16 0139 11/01/2016 0250  NA 134* 137 137  K 5.1 3.6 4.4  CL 91* 96* 95*  CO2 19* 20* 20*  GLUCOSE 100* 76 90  BUN 62* 34* 53*  CREATININE 5.49* 3.80* 4.79*  CALCIUM 8.6* 8.2* 8.7*  PHOS  --  6.2* 7.4*    Recent Labs Lab Nov 24, 2016 1249 11/01/16 0139 11/01/16 0140 10/26/2016 0250  AST 229*  --  134* 105*  ALT 175*  --  135* 135*  ALKPHOS 88  --  69 81  BILITOT 4.4* 4.6* 4.5* 4.4*  PROT 5.8*  --  5.4* 5.4*   ALBUMIN 3.6 3.2* 3.2* 3.4*    Recent Labs Lab Nov 24, 2016 1249  LIPASE 33    Recent Labs Lab 11-24-2016 1249  AMMONIA 39*    Recent Labs Lab 11-24-16 1145 11/01/16 0140 11/19/2016 0250  WBC 8.1 11.5* 11.0*  NEUTROABS 7.2 10.1*  --   HGB 15.9* 14.9 15.6*  HCT 48.9* 46.2* 48.9*  MCV 105.4* 102.9* 106.1*  PLT PLATELET CLUMPS NOTED ON SMEAR, UNABLE TO ESTIMATE 46* 58*    Recent Labs Lab November 24, 2016 1725 11/01/16 0139 11/01/16 0621 11/01/16 1745  TROPONINI 1.01* 1.90* 2.29* 2.04*    Recent Labs Lab 11/01/16 0754 11/01/16 1247 11/01/16 1616 11/01/16 2120 10/30/2016 0600  GLUCAP 72 67 78 89 107*       Component Value Date/Time   LATICACIDVEN 4.0 (HH) 11/01/2016 1438   Studies/Results: Dg Chest 2 View  Result Date: 2016/11/24 CLINICAL DATA:  Generalized weakness. EXAM: CHEST  2 VIEW COMPARISON:  September 12, 2016 FINDINGS: The study is limited by low lung volumes. The cardiomediastinal silhouette is  stable. There is a torturous thoracic aorta. The hila remain prominent but less so in the interval. No pulmonary nodules or masses. Mild atelectasis in the right base. No focal infiltrate or overt edema. IMPRESSION: No interval change or acute abnormality. Electronically Signed   By: Gerome Samavid  Williams III M.D   On: 2017/04/26 10:34   [place] Abdomen Limited Ruq  Result Date: 11/01/2016 CLINICAL DATA:  Initial evaluation for elevated bilirubin. History of cholecystectomy. EXAM: ULTRASOUND ABDOMEN LIMITED RIGHT UPPER QUADRANT COMPARISON:  Prior CT from 06/10/2016. FINDINGS: Gallbladder: Surgically absent. Common bile duct: Diameter: 5.2 mm Liver: No focal lesion identified. Within normal limits in parenchymal echogenicity. Small volume free fluid adjacent to the liver. Small right pleural effusion. IMPRESSION: 1. Status post cholecystectomy.  No biliary dilatation. 2. Small volume free fluid adjacent to the liver. 3. Right pleural effusion. Electronically Signed   By: Rise MuBenjamin  McClintock M.D.    On: 11/01/2016 02:41   Medications:  . aspirin EC  81 mg Oral Daily  . calcium carbonate  3 tablet Oral TID WC  . doxercalciferol  3 mcg Intravenous Q M,W,F-HD  . escitalopram  20 mg Oral Daily  . levothyroxine  100 mcg Oral QAC breakfast  . pravastatin  20 mg Oral q1800  . sodium chloride flush  3 mL Intravenous Q12H  clonazePAM, oxyCODONE-acetaminophen  Assessment/Recommendations:  1. NSTEMI - Cardiology feels "not much clinical evidence for an MI".  Troponin has plateaued. BP stable. Unable to tell us now if CP. No heparin 2/2 bleeding risk.  2. AMS/FTT - pt looks (today) to be terminally ill. Poorly responsive. Has told family members previously that she is "tired and wants to se Mama". Has been declining physically over past 1-2 weeks, stopped eating at the SNF. Discussions ongoing with POA. Palliative care to see. Best course in my opinion would be comfort care - but family would need to be clear that this would not include further dialysis. Have not written orders for HD tomorrow yet pending further EOL discussion swtih POA Ellery PlunkXavier Trevino.  3. ESRD - usual MWF AF. As above, have held off on orders for tomorrow pending discussion with POA. Did not tolerate HD well on Friday. (If family wishes for us to proceed tomorrow, will need to attempt volume removal). 4. Thrombocytopenia - No h/o heparin related problems. (Hb stable, no schistocytes). Trend. No bleeding 5. Elevated liver enzymes - admission AST 229 ALT 175 Bilirubin 4.4. Progressive worsening since initially abnormal April 2018. No cross sectional imaging since 05/2016. US absent GB, no biliary ductal dilatation. Transaminases sl improved today from admission, bili still >4. Further evaluation per primary service.  6. Anemia  - Hgb 14.9. No ESA need. Pt is Jehovah Witness 7. Metabolic bone disease -  Continue Hectorol/ Fosrenol binder when eating 8. Nutrition - Renal diet as tolerated /fluid restriction 9. Rash - on prednisone 60 mg  at SNF. No details on this. Most prominent on chest. Per MAR steroids started 6/1. Stopped on admission   Camille Balynthia Emree Locicero, MD [facility] [phone] pager 10/27/2016, 8:41 AM

## 2016-11-23 NOTE — Progress Notes (Signed)
Palliative NP Dunn in pt's room with pt and family for consult. Will give suppository after NP done talking with pt and family.

## 2016-11-23 NOTE — Consult Note (Signed)
Palliative Care Consult Note  HPI: 70 y.o. female  with past medical history of dCHF (EF 40-45%), pulmonary hypertension, CAD with RCA stenting, prior MIs, unstable angina, DM2, ESRD on dialysis, and PAD s/p left BKA. She presented to the ED from dialysis due to increased weakness and somnolence. She also endorsed intermittent chest pain. She was admitted on 11/10/2016 . Cardiology consulted and felt that she "does not have much clinical evidence for an MI." She is a poor candidate for interventional treatment and they recommended observation. Nephrology also consulted, they felt "best course [..] would be comfort care." Her clinical condition deteriorated, which prompted a Palliative Care consult for clarifying goals of care.  Assessment: I met [name] at her bedside. She was too somnolent to engage in a conversation about goals of care, which has been her baseline for the past few days per her extensive family at the bedside. With her large family, and in the presence of the primary team, we talked about her health leading up to this admission. [name], her brother and HCPOA, shared that she had been slowly declining for at least a month. This decline was much more pronounced in the past week wherein her intake became poor, she became weaker, and more confused. He also relayed that she has also been expressing a desire to join her mother, who died recently. He feels like she has lost her will to live, and has lost the strong fight she has had for so many years previously.   In talking through his observations, as well as discussing her current health issues, I detailed two different care paths forward. We talked about maintaining the status quo, which would be continuing blood draws, imaging as deemed beneficial, fluids, dialysis, etc., versus shifting the focus towards comfort, with the understanding that dialysis would be stopped and she would be supported as she approached the end of her life. He had discussed  this with the other members of her family, and they felt focusing on comfort was best aligned with her wishes. She was clear that she did not want any aggressive life prolonging measures, which he understood as her desire to be comfortable at the end of life. We talked about how that transition in care would occur.  ADDENDUM: Shortly after placing orders for comfort measures, [name] stopped breathing and died. I revisited her family at the bedside and provided listening presence. I also asked [name], her care nurse, to educate the family on the process after someone dies in the hospital.  Thank you to the primary team for involving Palliative with this patient. The conversations you facilitated prior to my arrival surely helped prepare the family for [name]'s death. It gave them the time to process her decline, grieve, and come to the decision to focus on comfort, which I believe she had at the end.    Total time: 50 minutes Greater than 50%  of this time was spent counseling and coordinating care related to the above assessment and plan.    AGNP-C Palliative Care [phone] 

## 2016-11-23 NOTE — Progress Notes (Signed)
Pt transported to morgue per protocol, death certificate sent with pt.

## 2016-11-23 NOTE — Progress Notes (Signed)
Progress Note  Patient Name: [name] Date of Encounter: 12-01-16  Primary Cardiologist: new  Subjective   Remains ill appearing but does not complain of pain.   Inpatient Medications    Scheduled Meds: . aspirin EC  81 mg Oral Daily  . calcium carbonate  3 tablet Oral TID WC  . doxercalciferol  3 mcg Intravenous Q M,W,F-HD  . escitalopram  20 mg Oral Daily  . levothyroxine  100 mcg Oral QAC breakfast  . pravastatin  20 mg Oral q1800  . sodium chloride flush  3 mL Intravenous Q12H   Continuous Infusions:  PRN Meds: clonazePAM, oxyCODONE-acetaminophen   Vital Signs    Vitals:   11/01/16 1546 11/01/16 2036 December 01, 2016 0100 2016-12-01 0355  BP: (!) 110/56 (!) 121/50  (!) 114/52  Pulse: (!) 109 83  81  Resp: 16 20    Temp:  97.6 F (36.4 C)  98.2 F (36.8 C)  TempSrc:  Oral  Axillary  SpO2: (!) 85% 100% 100% 96%  Weight:    172 lb 2.9 oz (78.1 kg)  Height:        Intake/Output Summary (Last 24 hours) at 2016/12/01 1114 Last data filed at 12/01/2016 0955  Gross per 24 hour  Intake              110 ml  Output                0 ml  Net              110 ml   Filed Weights   11/08/2016 2209 11/01/16 0600 December 01, 2016 0355  Weight: 168 lb 8 oz (76.4 kg) 169 lb (76.7 kg) 172 lb 2.9 oz (78.1 kg)    Telemetry    nsr - Personally Reviewed  ECG    none - Personally Reviewed  Physical Exam   GEN: frail and somnolent and lethargic but no acute distress.   Neck: 7 cm JVD Cardiac: RRR, no murmurs, rubs, or gallops.  Respiratory: Clear to auscultation bilaterally. GI: Soft, nontender, non-distended  MS: No edema; No deformity. Neuro:  Nonfocal  Psych: Normal affect   Labs    Chemistry Recent Labs Lab 10/26/2016 1249 11/01/16 0139 11/01/16 0140 12/01/16 0250  NA 134* 137  --  137  K 5.1 3.6  --  4.4  CL 91* 96*  --  95*  CO2 19* 20*  --  20*  GLUCOSE 100* 76  --  90  BUN 62* 34*  --  53*  CREATININE 5.49* 3.80*  --  4.79*  CALCIUM 8.6* 8.2*  --  8.7*    PROT 5.8*  --  5.4* 5.4*  ALBUMIN 3.6 3.2* 3.2* 3.4*  AST 229*  --  134* 105*  ALT 175*  --  135* 135*  ALKPHOS 88  --  69 81  BILITOT 4.4* 4.6* 4.5* 4.4*  GFRNONAA 7* 11*  --  9*  GFRAA 8* 13*  --  10*  ANIONGAP 24* 21*  --  22*     Hematology Recent Labs Lab 10/26/2016 1145 11/01/16 0140 2016-12-01 0250  WBC 8.1 11.5* 11.0*  RBC 4.64 4.49 4.61  HGB 15.9* 14.9 15.6*  HCT 48.9* 46.2* 48.9*  MCV 105.4* 102.9* 106.1*  MCH 34.3* 33.2 33.8  MCHC 32.5 32.3 31.9  RDW 22.8* 22.6* 23.3*  PLT PLATELET CLUMPS NOTED ON SMEAR, UNABLE TO ESTIMATE 46* 58*    Cardiac Enzymes Recent Labs Lab 10/24/2016 1725 11/01/16 0139 11/01/16 1610  11/01/16 1745  TROPONINI 1.01* 1.90* 2.29* 2.04*    Recent Labs Lab 10/30/2016 1137  TROPIPOC 0.84*     BNPNo results for input(s): BNP, PROBNP in the last 168 hours.   DDimer No results for input(s): DDIMER in the last 168 hours.   Radiology    [place] Abdomen Limited Ruq  Result Date: 11/01/2016 CLINICAL DATA:  Initial evaluation for elevated bilirubin. History of cholecystectomy. EXAM: ULTRASOUND ABDOMEN LIMITED RIGHT UPPER QUADRANT COMPARISON:  Prior CT from 06/10/2016. FINDINGS: Gallbladder: Surgically absent. Common bile duct: Diameter: 5.2 mm Liver: No focal lesion identified. Within normal limits in parenchymal echogenicity. Small volume free fluid adjacent to the liver. Small right pleural effusion. IMPRESSION: 1. Status post cholecystectomy.  No biliary dilatation. 2. Small volume free fluid adjacent to the liver. 3. Right pleural effusion. Electronically Signed   By: Rise MuBenjamin  McClintock M.D.   On: 11/01/2016 02:41    Cardiac Studies   2D echo - EF 40% 3 months ago  Patient Profile     71 y.o. female admitted with failure to thrive and found to have an elevated troponin.   Assessment & Plan    1. NSTEMI - her troponin is elevated but her overall health such that she is not a candidate for any interventional treatment. I think she is  actively in the process of dying. We sign off. Please call us back for questions.  2. ESRD on HD - Agree with Dr. Janit Bernunhams note. I think at this point continuing to do HD is futile.   Signed, Lewayne BuntingGregg Maleki Hippe, MD  Apr 18, 2017, 11:14 AM  Patient ID: [name], female   DOB: 18-May-1946, 71 y.o.   MRN: [id number]

## 2016-11-23 NOTE — Progress Notes (Signed)
Temp 102.8, Dr Obie DredgeBlum notified, tylenol suppository to be ordered.

## 2016-11-23 NOTE — Progress Notes (Signed)
[facility] ROUNDING NOTE:  Our team was present at the bedside this afternoon with [name] and multiple members of her family as well as her POA Jess Barters(Xavier) during their encounter with our Palliative Care Team.  Discussion was held regarding maintaining current treatment with blood monitoring, imaging as needed, potential for antibiotics, dialysis, etc versus proceeding with focus of comfort with the agreement of not continuing the aforementioned things.  Her POA had discussed this with other members of the family and they felt placing focus on comfort was best in line with her wishes.  She was clear she did not want aggressive life-prolonging measures.  They were in agreement with comfort with [facility].  However, shortly after this meeting led by our Palliative team, [name] stopped breathing and passed away.  We went back to the bedside with our attending and provided comfort as able to the family.  Gwynn BurlyAndrew Braxtyn Bojarski, DO 09-03-2016, 2:26 PM PGY-2, [facility] Pager: [phone]

## 2016-11-23 NOTE — Death Summary Note (Signed)
  Name: Melodie BouillonMaria C Hildreth MRN: [id number] DOB: 1945-07-19 71 y.o.  Date of Admission: 10/30/2016  9:19 AM Date of Discharge: September 14, 2016 Attending Physician: Earl LagosNarendra, Nischal, MD  Discharge Diagnosis: Principal Problem:   Non-ST elevation (NSTEMI) myocardial infarction [facility](HCC) Active Problems:   ESRD (end stage renal disease) (HCC)   CAD, multiple vessel   PAD (peripheral artery disease) (HCC)   CHF (congestive heart failure) (HCC)   Hyperparathyroidism, secondary (HCC)   Altered mental status   Generalized weakness   Elevated bilirubin   Lactic acidosis   Pressure injury of skin   Acute renal failure (HCC)   Goals of care, counseling/discussion   Palliative care by specialist  Cause of death: Non-STE Myocardial Infarction 2/2 CAD, ESRD, PAD, CHF, DM Time of death: 13:35  Disposition and follow-up:   Ms.Alethia C Donnamarie Rossettiineda [facility] expired condition.    Hospital Course:  NSTEMI: Patient presented with NSTEMI with no acute changes to her EKG.  Her troponin was elevated and peaked at ~2.9.  Patient was initially managed on a heparin gtt and given Aspirin.  She has known CAD with most recent cardiac catheterization from April 2018.  Cardiology was consulted who did not think she was a good interventional candidate given her failure to thrive and other medical comorbidities.  Her heparin gtt was discontinued due to thrombocytopenia and high risk for bleeding.  Appreciate cardiology input and assistance.   Her blood pressure remained stable.  Failure to Thrive Generalized weakness Lethargy Patient continued to deteriorate during her hospital course up to the time of her passing away.  Family members that were present noted that she was more tired in recent weeks and would say she wanted to see her deceased mother.  She also had been eating and drinking less at her skilled nursing facility.  She was a DNR.  Due to the consensus from our cardiology and  nephrology colleagues, end of life discussions were initiated with family members and patients POA.  Palliative Care was consulted for further assistance.  Family and POA decided to move towards Comfort Care and patient passed away shortly thereafter.  ESRD on HD MWF Patient presented after missing her HD on the day of admission.  She was dialyzed that evening but did not tolerate it well per HD notes.  No further HD was attempted as patient was made comfort care and passed shortly after.  Signed: Gwynn BurlyWallace, Imagene Boss, DO September 14, 2016, 2:39 PM

## 2016-11-23 DEATH — deceased

## 2017-08-06 ENCOUNTER — Encounter (HOSPITAL_COMMUNITY): Payer: Medicare (Managed Care)

## 2017-08-06 ENCOUNTER — Ambulatory Visit: Payer: Medicare (Managed Care)
